# Patient Record
Sex: Male | Born: 1950
Health system: Southern US, Community
[De-identification: ages and names within clinical notes are randomized; demographics above are authoritative.]

## PROBLEM LIST (undated history)

## (undated) DIAGNOSIS — I2510 Atherosclerotic heart disease of native coronary artery without angina pectoris: Secondary | ICD-10-CM

## (undated) DIAGNOSIS — I209 Angina pectoris, unspecified: Secondary | ICD-10-CM

## (undated) DIAGNOSIS — I5022 Chronic systolic (congestive) heart failure: Secondary | ICD-10-CM

## (undated) DIAGNOSIS — Z8719 Personal history of other diseases of the digestive system: Secondary | ICD-10-CM

## (undated) DIAGNOSIS — I10 Essential (primary) hypertension: Secondary | ICD-10-CM

## (undated) DIAGNOSIS — G4730 Sleep apnea, unspecified: Secondary | ICD-10-CM

## (undated) DIAGNOSIS — I219 Acute myocardial infarction, unspecified: Secondary | ICD-10-CM

## (undated) DIAGNOSIS — N183 Chronic kidney disease, stage 3 unspecified: Secondary | ICD-10-CM

## (undated) DIAGNOSIS — E785 Hyperlipidemia, unspecified: Secondary | ICD-10-CM

## (undated) DIAGNOSIS — R0609 Other forms of dyspnea: Secondary | ICD-10-CM

## (undated) DIAGNOSIS — W3400XA Accidental discharge from unspecified firearms or gun, initial encounter: Secondary | ICD-10-CM

## (undated) DIAGNOSIS — F32A Depression, unspecified: Secondary | ICD-10-CM

## (undated) DIAGNOSIS — G629 Polyneuropathy, unspecified: Secondary | ICD-10-CM

## (undated) DIAGNOSIS — E119 Type 2 diabetes mellitus without complications: Secondary | ICD-10-CM

## (undated) DIAGNOSIS — I429 Cardiomyopathy, unspecified: Secondary | ICD-10-CM

## (undated) DIAGNOSIS — F329 Major depressive disorder, single episode, unspecified: Secondary | ICD-10-CM

## (undated) DIAGNOSIS — F0280 Dementia in other diseases classified elsewhere without behavioral disturbance: Secondary | ICD-10-CM

## (undated) DIAGNOSIS — H9190 Unspecified hearing loss, unspecified ear: Secondary | ICD-10-CM

## (undated) DIAGNOSIS — R0600 Dyspnea, unspecified: Secondary | ICD-10-CM

## (undated) DIAGNOSIS — R062 Wheezing: Secondary | ICD-10-CM

## (undated) DIAGNOSIS — F0150 Vascular dementia without behavioral disturbance: Secondary | ICD-10-CM

## (undated) HISTORY — PX: HERNIA REPAIR: SHX51

## (undated) HISTORY — PX: OTHER SURGICAL HISTORY: SHX169

## (undated) HISTORY — PX: CARDIAC CATHETERIZATION: SHX172

## (undated) HISTORY — PX: CORONARY ANGIOPLASTY: SHX604

## (undated) HISTORY — PX: EYE SURGERY: SHX253

## (undated) HISTORY — PX: CHOLECYSTECTOMY: SHX55

---

## 1980-02-20 DIAGNOSIS — W3400XA Accidental discharge from unspecified firearms or gun, initial encounter: Secondary | ICD-10-CM

## 1980-02-20 HISTORY — DX: Accidental discharge from unspecified firearms or gun, initial encounter: W34.00XA

## 1998-12-19 ENCOUNTER — Inpatient Hospital Stay (HOSPITAL_COMMUNITY): Admission: EM | Admit: 1998-12-19 | Discharge: 1998-12-21 | Payer: Self-pay | Admitting: Cardiology

## 1998-12-19 ENCOUNTER — Encounter: Payer: Self-pay | Admitting: Cardiology

## 2004-08-30 ENCOUNTER — Ambulatory Visit: Payer: Self-pay | Admitting: Vascular Surgery

## 2004-10-23 ENCOUNTER — Ambulatory Visit: Payer: Self-pay | Admitting: Nurse Practitioner

## 2005-01-09 ENCOUNTER — Ambulatory Visit: Payer: Self-pay | Admitting: Cardiology

## 2005-05-31 ENCOUNTER — Other Ambulatory Visit: Payer: Self-pay

## 2005-05-31 ENCOUNTER — Observation Stay: Payer: Self-pay | Admitting: Internal Medicine

## 2006-01-17 ENCOUNTER — Other Ambulatory Visit: Payer: Self-pay

## 2006-01-17 ENCOUNTER — Inpatient Hospital Stay: Payer: Self-pay | Admitting: *Deleted

## 2006-02-22 ENCOUNTER — Other Ambulatory Visit: Payer: Self-pay

## 2006-02-22 ENCOUNTER — Emergency Department: Payer: Self-pay | Admitting: Unknown Physician Specialty

## 2008-02-05 ENCOUNTER — Ambulatory Visit: Payer: Self-pay | Admitting: Family Medicine

## 2009-10-13 ENCOUNTER — Inpatient Hospital Stay: Payer: Self-pay | Admitting: Internal Medicine

## 2010-07-03 ENCOUNTER — Ambulatory Visit: Payer: Self-pay | Admitting: Internal Medicine

## 2010-07-21 ENCOUNTER — Ambulatory Visit: Payer: Self-pay | Admitting: Internal Medicine

## 2010-08-20 ENCOUNTER — Ambulatory Visit: Payer: Self-pay | Admitting: Internal Medicine

## 2010-09-20 ENCOUNTER — Ambulatory Visit: Payer: Self-pay | Admitting: Internal Medicine

## 2010-10-21 ENCOUNTER — Ambulatory Visit: Payer: Self-pay | Admitting: Internal Medicine

## 2010-11-20 ENCOUNTER — Ambulatory Visit: Payer: Self-pay

## 2010-11-20 ENCOUNTER — Ambulatory Visit: Payer: Self-pay | Admitting: Internal Medicine

## 2011-08-09 ENCOUNTER — Inpatient Hospital Stay: Payer: Self-pay | Admitting: Internal Medicine

## 2011-08-09 LAB — BASIC METABOLIC PANEL
Anion Gap: 4 — ABNORMAL LOW (ref 7–16)
BUN: 14 mg/dL (ref 7–18)
Calcium, Total: 8.9 mg/dL (ref 8.5–10.1)
Chloride: 107 mmol/L (ref 98–107)
Co2: 26 mmol/L (ref 21–32)
Creatinine: 1.52 mg/dL — ABNORMAL HIGH (ref 0.60–1.30)
EGFR (African American): 56 — ABNORMAL LOW
EGFR (Non-African Amer.): 48 — ABNORMAL LOW
Glucose: 173 mg/dL — ABNORMAL HIGH (ref 65–99)
Osmolality: 278 (ref 275–301)
Potassium: 4.6 mmol/L (ref 3.5–5.1)
Sodium: 137 mmol/L (ref 136–145)

## 2011-08-09 LAB — TROPONIN I
Troponin-I: <0.02 ng/mL
Troponin-I: <0.02 ng/mL

## 2011-08-09 LAB — CK TOTAL AND CKMB (NOT AT ARMC)
CK, Total: 214 U/L (ref 35–232)
CK, Total: 169 U/L (ref 35–232)
CK-MB: 4.3 ng/mL — ABNORMAL HIGH (ref 0.5–3.6)
CK-MB: 3.3 ng/mL (ref 0.5–3.6)

## 2011-08-09 LAB — CBC
HCT: 34.5 % — ABNORMAL LOW (ref 40.0–52.0)
HGB: 11.6 g/dL — ABNORMAL LOW (ref 13.0–18.0)
MCH: 28.7 pg (ref 26.0–34.0)
MCHC: 33.7 g/dL (ref 32.0–36.0)
MCV: 85 fL (ref 80–100)
Platelet: 99 10*3/uL — ABNORMAL LOW (ref 150–440)
RBC: 4.05 10*6/uL — ABNORMAL LOW (ref 4.40–5.90)
RDW: 15.4 % — ABNORMAL HIGH (ref 11.5–14.5)
WBC: 4.4 10*3/uL (ref 3.8–10.6)

## 2011-08-10 LAB — BASIC METABOLIC PANEL
Anion Gap: 4 — ABNORMAL LOW (ref 7–16)
BUN: 16 mg/dL (ref 7–18)
Co2: 28 mmol/L (ref 21–32)
EGFR (African American): 56 — ABNORMAL LOW
EGFR (Non-African Amer.): 48 — ABNORMAL LOW
Glucose: 97 mg/dL (ref 65–99)
Potassium: 4.4 mmol/L (ref 3.5–5.1)

## 2011-08-10 LAB — CBC WITH DIFFERENTIAL/PLATELET
Basophil #: 0 10*3/uL (ref 0.0–0.1)
HCT: 33 % — ABNORMAL LOW (ref 40.0–52.0)
HGB: 11.4 g/dL — ABNORMAL LOW (ref 13.0–18.0)
Lymphocyte #: 1.2 10*3/uL (ref 1.0–3.6)
Lymphocyte %: 24.9 %
MCH: 29.3 pg (ref 26.0–34.0)
MCHC: 34.5 g/dL (ref 32.0–36.0)
Monocyte #: 0.8 x10 3/mm (ref 0.2–1.0)
Monocyte %: 16.7 %
Neutrophil %: 54.5 %
RDW: 14.9 % — ABNORMAL HIGH (ref 11.5–14.5)

## 2011-08-10 LAB — LIPID PANEL
Cholesterol: 114 mg/dL (ref 0–200)
HDL Cholesterol: 22 mg/dL — ABNORMAL LOW (ref 40–60)
Ldl Cholesterol, Calc: 32 mg/dL (ref 0–100)
Triglycerides: 300 mg/dL — ABNORMAL HIGH (ref 0–200)
VLDL Cholesterol, Calc: 60 mg/dL — ABNORMAL HIGH (ref 5–40)

## 2011-08-10 LAB — TROPONIN I: Troponin-I: <0.02 ng/mL

## 2011-08-10 LAB — HEMOGLOBIN A1C: Hemoglobin A1C: 7.9 % — ABNORMAL HIGH (ref 4.2–6.3)

## 2011-08-10 LAB — CK TOTAL AND CKMB (NOT AT ARMC): CK, Total: 158 U/L (ref 35–232)

## 2011-09-11 LAB — TROPONIN I: Troponin-I: <0.02 ng/mL

## 2011-09-11 LAB — BASIC METABOLIC PANEL
Anion Gap: 8 (ref 7–16)
BUN: 35 mg/dL — ABNORMAL HIGH (ref 7–18)
Calcium, Total: 8.7 mg/dL (ref 8.5–10.1)
Chloride: 106 mmol/L (ref 98–107)
Co2: 24 mmol/L (ref 21–32)
Creatinine: 1.83 mg/dL — ABNORMAL HIGH (ref 0.60–1.30)
EGFR (African American): 45 — ABNORMAL LOW
EGFR (Non-African Amer.): 39 — ABNORMAL LOW
Glucose: 125 mg/dL — ABNORMAL HIGH (ref 65–99)
Osmolality: 285 (ref 275–301)
Potassium: 4.4 mmol/L (ref 3.5–5.1)
Sodium: 138 mmol/L (ref 136–145)

## 2011-09-11 LAB — CBC
HCT: 38.3 % — ABNORMAL LOW (ref 40.0–52.0)
HGB: 12.9 g/dL — ABNORMAL LOW (ref 13.0–18.0)
MCH: 29.3 pg (ref 26.0–34.0)
MCHC: 33.6 g/dL (ref 32.0–36.0)
MCV: 87 fL (ref 80–100)
Platelet: 114 10*3/uL — ABNORMAL LOW (ref 150–440)
RBC: 4.4 10*6/uL (ref 4.40–5.90)
RDW: 15.5 % — ABNORMAL HIGH (ref 11.5–14.5)
WBC: 8.2 10*3/uL (ref 3.8–10.6)

## 2011-09-12 ENCOUNTER — Observation Stay: Payer: Self-pay | Admitting: Internal Medicine

## 2011-09-12 LAB — URINALYSIS, COMPLETE
Bacteria: NONE SEEN
Bilirubin,UR: NEGATIVE
Blood: NEGATIVE
Glucose,UR: 150 mg/dL (ref 0–75)
Hyaline Cast: 3
Ketone: NEGATIVE
Leukocyte Esterase: NEGATIVE
Nitrite: NEGATIVE
Ph: 5 (ref 4.5–8.0)
Protein: NEGATIVE
RBC,UR: NONE SEEN /HPF (ref 0–5)
Specific Gravity: 1.023 (ref 1.003–1.030)
Squamous Epithelial: NONE SEEN
WBC UR: 1 /HPF (ref 0–5)

## 2011-09-12 LAB — BASIC METABOLIC PANEL
Anion Gap: 7 (ref 7–16)
BUN: 30 mg/dL — ABNORMAL HIGH (ref 7–18)
Calcium, Total: 8.6 mg/dL (ref 8.5–10.1)
Chloride: 105 mmol/L (ref 98–107)
Co2: 27 mmol/L (ref 21–32)
Creatinine: 1.69 mg/dL — ABNORMAL HIGH (ref 0.60–1.30)
EGFR (African American): 49 — ABNORMAL LOW
EGFR (Non-African Amer.): 43 — ABNORMAL LOW
Glucose: 275 mg/dL — ABNORMAL HIGH (ref 65–99)
Osmolality: 294 (ref 275–301)
Potassium: 4.5 mmol/L (ref 3.5–5.1)
Sodium: 139 mmol/L (ref 136–145)

## 2012-09-30 ENCOUNTER — Emergency Department: Payer: Self-pay | Admitting: Emergency Medicine

## 2014-03-11 ENCOUNTER — Ambulatory Visit (HOSPITAL_COMMUNITY): Payer: Self-pay | Admitting: Psychiatry

## 2014-03-15 ENCOUNTER — Ambulatory Visit (HOSPITAL_COMMUNITY): Payer: Self-pay | Admitting: Psychiatry

## 2014-04-23 DIAGNOSIS — Z9861 Coronary angioplasty status: Secondary | ICD-10-CM | POA: Insufficient documentation

## 2014-04-23 DIAGNOSIS — Z9889 Other specified postprocedural states: Secondary | ICD-10-CM | POA: Insufficient documentation

## 2014-04-23 DIAGNOSIS — I10 Essential (primary) hypertension: Secondary | ICD-10-CM | POA: Insufficient documentation

## 2014-04-23 DIAGNOSIS — G4733 Obstructive sleep apnea (adult) (pediatric): Secondary | ICD-10-CM | POA: Insufficient documentation

## 2014-04-23 DIAGNOSIS — Z8739 Personal history of other diseases of the musculoskeletal system and connective tissue: Secondary | ICD-10-CM | POA: Insufficient documentation

## 2014-04-23 DIAGNOSIS — E785 Hyperlipidemia, unspecified: Secondary | ICD-10-CM | POA: Insufficient documentation

## 2014-04-23 DIAGNOSIS — I429 Cardiomyopathy, unspecified: Secondary | ICD-10-CM | POA: Insufficient documentation

## 2014-04-23 DIAGNOSIS — I219 Acute myocardial infarction, unspecified: Secondary | ICD-10-CM | POA: Insufficient documentation

## 2014-04-26 DIAGNOSIS — R0602 Shortness of breath: Secondary | ICD-10-CM | POA: Insufficient documentation

## 2014-06-08 NOTE — Discharge Summary (Signed)
PATIENT NAME:  Todd Spencer, Todd Spencer MR#:  [id number] DATE OF BIRTH:  07/26/1950  DATE OF ADMISSION:  09/12/2011 DATE OF DISCHARGE:  09/12/2011   DISCHARGE DIAGNOSES:  1. Hypoglycemia.  2. Acute encephalopathy.  3. Dehydration.  4. Acute renal failure/CKD.  5. Hypertension.  6. Coronary artery disease.   CONSULTS: None.   IMAGING STUDIES:  1. CT scan of the head showed cortical atrophy and chronic small vessel ischemia. No evidence of acute abnormalities.  2. Chest x-ray showed no acute cardiopulmonary abnormalities.   ADMITTING HISTORY AND PHYSICAL: Please see detailed history and physical dictated previously by Dr. Lenore Manner. In brief, this is a 64 year old Caucasian male patient with uncontrolled diabetes mellitus on insulin and oral hypoglycemic medications along with coronary artery disease and hypertension who presented to the Emergency Room complaining of dizziness, confusion, and blurring of vision. On checking his blood sugars, he was at 47. The patient presented to the Emergency Room after calling EMS and was admitted for further treatment with his persistent hypoglycemia.   HOSPITAL COURSE: The patient was admitted on the telemonitored floor. His insulin and oral hypoglycemics were held with which his blood sugars improved. He did not have any further hypoglycemic episodes and at the time of discharge blood sugars were 189. The patient does not have any confusion, is back to his baseline, and is being discharged home in a stable condition with blood pressure 142/81, respirations 18, saturating 97% on room air, and afebrile.   At the time of discharge the patient's insulin has been decreased from 80 units of Lantus once a day to 65 units and his glipizide decreased from 10 mg to 5 mg twice a day.   DISCHARGE MEDICATIONS:  1. Multivitamin 1 tablet oral once a day.  2. Lyrica 300 mg oral 2 times a day.  3. Isosorbide 30 mg oral 3 times a day.  4. Gemfibrozil 600 mg oral 2 times a day.   5. Ranitidine 1 tablet oral 2 times a day 150 mg.  6. Doxepin 100 mg oral once a day.  7. Fish Oil 1 capsule oral once a day.  8. Plavix 75 mg oral once a day.  9. Meclizine 25 mg oral 2 times a day.  10. Aspirin 81 mg oral once a day.  11. Quinapril 10 mg oral once a day.  12. Mucinex D 1 tablet oral 1 to 2 times a day as needed.  13. Simvastatin 40 mg oral once a day.  14. Metoprolol succinate 12.5 mg oral 2 times a day.  15. Lantus 65 units subcutaneous once a day.  16. Glipizide 10 mg oral half a tablet 2 times a day.   DISCHARGE INSTRUCTIONS:  1. Follow-up with primary care physician within a week.  2. The patient has been advised to check his blood sugars before meals and at bedtime, a total of four times a day. He is to keep a log and take it to primary care physician's office. 3. He will be on a diabetic diet.  4. The patient is to call his doctor or return to the Emergency Room if he notices any blood sugars less than 60.   TIME SPENT ON THIS DICTATION AND COORDINATING CARE: 35 minutes.   ____________________________ Leia Alf Jaxyn Rout, MD srs:drc D: 09/12/2011 12:45:27 ET T: 09/12/2011 14:04:32 ET JOB#: [id number]  cc: Alveta Heimlich R. Darvin Neighbours, MD, <Dictator> Marguerita Merles, MD Neita Carp MD ELECTRONICALLY SIGNED 09/19/2011 13:49
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
## 2014-06-13 NOTE — Consult Note (Signed)
Brief Consult Note: Diagnosis: pt with history of cad s/p pci and poba of rca admitted with chest pain with both typical and atypical features.   Patient was seen by consultant.   Consult note dictated.   Recommend to proceed with surgery or procedure.   Recommend further assessment or treatment.   Orders entered.   Comments: pt with chest pain similar to his angina now has ruled out for mi.  stable with no acute ekg changes  will need cardiac cath to evaluate anatomy in pt with chest pain, cad at rest..  risk and benefits discussed.  Electronic Signatures: Teodoro Spray (MD)  (Signed 21-Jun-13 05:51)  Authored: Brief Consult Note   Last Updated: 21-Jun-13 05:51 by Teodoro Spray (MD)

## 2014-06-13 NOTE — Discharge Summary (Signed)
PATIENT NAME:  Todd Spencer, MCMINN MR#:  [id number] DATE OF BIRTH:  [birth year]  DATE OF ADMISSION:  08/09/2011 DATE OF DISCHARGE:  08/10/2011  ADMITTING PHYSICIAN: Dr. Bettey Costa DISCHARGING PHYSICIAN: Dr. Gladstone Lighter  PRIMARY CARE PHYSICIAN: Dr. Delight Stare   CONSULTATION IN HOSPITAL: Cardiology consultation Dr. Bartholome Bill.   DISCHARGE DIAGNOSES:  1. Chest pain secondary to unstable angina and cardiac catheterization done this admission showing severe right coronary artery disease not amenable for any stenting or bypass surgery.  2. Coronary artery disease with diffuse right coronary artery disease on catheterization.  3. Hypertension.  4. Insulin-dependent diabetes mellitus.  5. Hyperlipemia.  6. Ischemic cardiomyopathy.  7. Peripheral neuropathy.   DISCHARGE HOME MEDICATIONS:  1. Lantus 80 units sub-Q daily.  2. Multivitamin daily.  3. Lyrica 300 mg p.o. b.i.d.  4. Imdur 30 mg p.o. t.i.d.  5. Glipizide 10 mg p.o. b.i.d.  6. Gemfibrozil 600 mg p.o. b.i.d.  7. Ranitidine 150 mg p.o. b.i.d.  8. Doxepin 100 mg in the morning and 200 mg at bedtime.  9. Fish oil 1 capsule p.o. daily.  10. Plavix 75 mg p.o. daily.  11. Meclizine 25 mg p.o. b.i.d.  12. Naproxen 500 mg p.o. b.i.d.  13. Aspirin 81 mg p.o. daily.  14. Quinapril 10 mg p.o. daily.  15. Mucinex p.r.n. once or twice a day for cough.  16. Simvastatin 40 mg p.o. at bedtime.   17. Metoprolol 12.5 mg p.o. b.i.d.  18. Nitroglycerin sublingual tablet 0.4 mg q.5 minutes p.r.n. for chest pain.  19. Patient also on metformin 1000 mg p.o. b.i.d. but that was asked to stop for two more days because of his cardiac catheterization with contrast done.   DISCHARGE DIET: Low sodium, ADA diet.   DISCHARGE HOME OXYGEN: None.   DISCHARGE ACTIVITY: As tolerated.    FOLLOWUP INSTRUCTIONS:  1. PCP follow up in 1 to 2 weeks.  2. Cardiology follow up in 2 to 3 weeks. 3. Hold metoprolol if heart rate is less than 50.   LABORATORY,  DIAGNOSTIC AND RADIOLOGICAL DATA:  WBC 4.8, hemoglobin 11.4, hematocrit 33.0, platelet count 98.   Sodium 139, potassium 4.4, chloride 107, bicarbonate 28, BUN 16, creatinine 1.52, glucose 97, calcium 8.6. Hemoglobin A1c 7.9, LDL 32, HDL 22, total cholesterol 114, triglycerides 300. Cardiac enzymes remained negative while in the hospital. Chest x-ray showing no acute disease of the chest present.  Cardiac catheterization showing severe one vessel coronary artery disease of RCA diffuse 99% in stent restenosis and distal RCA and also occluded PDA with minimal collaterals present. Moderate apical hypokinesis and posterior basal akinesis is present.   BRIEF HOSPITAL COURSE: Mr. Todd Spencer is a 64 year old Caucasian male with past medical history significant for coronary artery disease status post stents in RCA, hypertension, diabetes presents to the hospital complaining of severe left-sided chest pain radiating down the arm. Because of his typical nature of the pain and risk for heart disease he was admitted to telemetry and seen by cardiology.  1. Unstable angina with prior coronary artery disease. He had a cardiac catheterization done on 08/10/2011 which revealed 99% in stent restenosis of RCA and also 100% PDA occlusion with minimal collaterals. Medical management was recommended because that was not amenable for any stenting or bypass graft surgery. She is already on Imdur and also Quinapril and aspirin and Plavix. Metoprolol dose was decreased because of his sinus bradycardia while in the hospital, heart rate in 50s mostly. He can follow up with cardiology in  2 to 3 weeks.  2. Diabetes mellitus. He is on Lantus, glipizide and also metformin. All his medications were continued except metformin because of the contrast he received during cardiac catheterization procedure so advised to hold off  for another 48 hours prior to restarting it. All his other home medications were continued. His course has been  otherwise uneventful in the hospital.   DISCHARGE CONDITION: Stable.   DISCHARGE DISPOSITION: Home.   TIME SPENT ON DISCHARGE: 45 minutes.  ____________________________ Gladstone Lighter, MD rk:cms D: 08/11/2011 11:26:24 ET T: 08/11/2011 12:44:38 ET JOB#: [id number]  cc: Gladstone Lighter, MD, <Dictator> Marguerita Merles, MD Javier Docker. Ubaldo Glassing, MD Gladstone Lighter MD ELECTRONICALLY SIGNED 08/15/2011 15:25
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
## 2014-06-13 NOTE — Consult Note (Signed)
    General Aspect patient is a 64 year old male with history of coronary artery disease status post PCI of the right coronary artery with relook cardiac catheterization in 2011 revealing distal RCA disease.  Intracoronary stent was unable to be passed through the proximal lesion and this was treated with plain old balloon angioplasty.  He now returns with chest pain that he states is similar to his angina.  EKG is unremarkable and he has ruled out for myocardial infarction.  He continues to have chest pain at rest.  He has been compliant with his medications.   Physical Exam:   GEN well nourished    HEENT PERRL    RESP normal resp effort  clear BS    CARD Regular rate and rhythm  Murmur    Murmur Systolic    Systolic Murmur axilla    ABD denies tenderness  normal BS    LYMPH negative neck    EXTR negative cyanosis/clubbing, negative edema    SKIN normal to palpation    NEURO cranial nerves intact, motor/sensory function intact    PSYCH A+O to time, place, person   Review of Systems:   Subjective/Chief Complaint chest pain at rest    General: No Complaints    Skin: No Complaints    ENT: No Complaints    Eyes: No Complaints    Neck: No Complaints    Respiratory: No Complaints    Cardiovascular: Chest pain or discomfort    Gastrointestinal: No Complaints    Genitourinary: No Complaints    Vascular: No Complaints    Musculoskeletal: No Complaints    Neurologic: No Complaints    Hematologic: No Complaints    Endocrine: No Complaints    Psychiatric: No Complaints    Review of Systems: All other systems were reviewed and found to be negative    Medications/Allergies Reviewed Medications/Allergies reviewed     Diabetes Mellitus,Type I (IDD):    Hypercholesterolemia:    Fibromyalgia:    neuropathy:    carpal tunnel syndrome:    Diabetes Mellitus, Type II (NIDD):    htn:    mi:    cardiac stents:    CABG (Coronary Artery Bypass Graft):    EKG:   EKG NSR    No Known Allergies:     Impression 64 year old male with history diabetes hypertension and coronary artery disease.  He is status post C. intervention of the RCA.  No presents with recurrent chest pain.  He is ruled out for myocardial infarction states the chest pain is similar to his angina.  He is unchanged from his baseline.  Given his persistent rest chest pain somewhat to his angina, we'll need to proceed left cardiac catheterization evaluate coronary anatomy    Plan 1.  Continue current medications 2.  Low-fat, low-cholesterol, low-sodium diet 3.  Proceed left cardiac catheterization evaluate coronary anatomy.  Further recommendations after the study is complete.  Risk and benefits of procedure were discussed with the patient he agrees to proceed.   Electronic Signatures: Teodoro Spray (MD)  (Signed 21-Jun-13 14:01)  Authored: General Aspect/Present Illness, History and Physical Exam, Review of System, Past Medical History, EKG , Allergies, Impression/Plan   Last Updated: 21-Jun-13 14:01 by Teodoro Spray (MD)

## 2014-07-01 ENCOUNTER — Encounter
Admission: RE | Admit: 2014-07-01 | Discharge: 2014-07-01 | Disposition: A | Discharge status: Home / Self Care | Payer: Medicare HMO | Source: Ambulatory Visit | Attending: Ophthalmology | Admitting: Ophthalmology

## 2014-07-01 DIAGNOSIS — R001 Bradycardia, unspecified: Secondary | ICD-10-CM | POA: Insufficient documentation

## 2014-07-01 DIAGNOSIS — Z01810 Encounter for preprocedural cardiovascular examination: Secondary | ICD-10-CM | POA: Diagnosis not present

## 2014-07-01 DIAGNOSIS — I495 Sick sinus syndrome: Secondary | ICD-10-CM | POA: Diagnosis not present

## 2014-07-11 NOTE — H&P (Signed)
  History and physical was faxed and scanned in.   

## 2014-07-12 ENCOUNTER — Ambulatory Visit: Payer: Medicare HMO | Admitting: *Deleted

## 2014-07-12 ENCOUNTER — Encounter
Admission: RE | Disposition: A | Discharge status: Home / Self Care | Payer: Self-pay | Source: Ambulatory Visit | Attending: Ophthalmology

## 2014-07-12 ENCOUNTER — Encounter: Payer: Self-pay | Admitting: *Deleted

## 2014-07-12 ENCOUNTER — Ambulatory Visit
Admission: RE | Admit: 2014-07-12 | Discharge: 2014-07-12 | Disposition: A | Discharge status: Home / Self Care | Payer: Medicare HMO | Source: Ambulatory Visit | Attending: Ophthalmology | Admitting: Ophthalmology

## 2014-07-12 DIAGNOSIS — G629 Polyneuropathy, unspecified: Secondary | ICD-10-CM | POA: Diagnosis not present

## 2014-07-12 DIAGNOSIS — H2511 Age-related nuclear cataract, right eye: Secondary | ICD-10-CM | POA: Insufficient documentation

## 2014-07-12 DIAGNOSIS — E780 Pure hypercholesterolemia: Secondary | ICD-10-CM | POA: Diagnosis not present

## 2014-07-12 DIAGNOSIS — I209 Angina pectoris, unspecified: Secondary | ICD-10-CM | POA: Diagnosis not present

## 2014-07-12 DIAGNOSIS — G4730 Sleep apnea, unspecified: Secondary | ICD-10-CM | POA: Diagnosis not present

## 2014-07-12 DIAGNOSIS — F329 Major depressive disorder, single episode, unspecified: Secondary | ICD-10-CM | POA: Insufficient documentation

## 2014-07-12 DIAGNOSIS — Z79899 Other long term (current) drug therapy: Secondary | ICD-10-CM | POA: Insufficient documentation

## 2014-07-12 DIAGNOSIS — R062 Wheezing: Secondary | ICD-10-CM | POA: Diagnosis not present

## 2014-07-12 DIAGNOSIS — E119 Type 2 diabetes mellitus without complications: Secondary | ICD-10-CM | POA: Diagnosis not present

## 2014-07-12 DIAGNOSIS — Z955 Presence of coronary angioplasty implant and graft: Secondary | ICD-10-CM | POA: Insufficient documentation

## 2014-07-12 DIAGNOSIS — K449 Diaphragmatic hernia without obstruction or gangrene: Secondary | ICD-10-CM | POA: Diagnosis not present

## 2014-07-12 DIAGNOSIS — I2510 Atherosclerotic heart disease of native coronary artery without angina pectoris: Secondary | ICD-10-CM | POA: Insufficient documentation

## 2014-07-12 DIAGNOSIS — H9190 Unspecified hearing loss, unspecified ear: Secondary | ICD-10-CM | POA: Insufficient documentation

## 2014-07-12 DIAGNOSIS — I252 Old myocardial infarction: Secondary | ICD-10-CM | POA: Insufficient documentation

## 2014-07-12 HISTORY — PX: CATARACT EXTRACTION W/PHACO: SHX586

## 2014-07-12 LAB — GLUCOSE, CAPILLARY: Glucose-Capillary: 149 mg/dL — ABNORMAL HIGH (ref 65–99)

## 2014-07-12 SURGERY — PHACOEMULSIFICATION, CATARACT, WITH IOL INSERTION
Anesthesia: Monitor Anesthesia Care | Laterality: Right

## 2014-07-12 MED ORDER — EPINEPHRINE HCL 1 MG/ML IJ SOLN
INTRAOCULAR | Status: DC | PRN
  Administered 2014-07-12: 200 mL

## 2014-07-12 MED ORDER — CEFUROXIME OPHTHALMIC INJECTION 1 MG/0.1 ML
INJECTION | OPHTHALMIC | Status: AC
  Filled 2014-07-12: qty 0.1

## 2014-07-12 MED ORDER — ALFENTANIL 500 MCG/ML IJ INJ
INJECTION | INTRAMUSCULAR | Status: DC | PRN
  Administered 2014-07-12: 500 ug via INTRAVENOUS

## 2014-07-12 MED ORDER — MOXIFLOXACIN HCL 0.5 % OP SOLN - NO CHARGE
OPHTHALMIC | Status: DC | PRN
  Administered 2014-07-12: 1 [drp] via OPHTHALMIC

## 2014-07-12 MED ORDER — PHENYLEPHRINE HCL 10 % OP SOLN
OPHTHALMIC | Status: AC
  Administered 2014-07-12: 1 [drp] via OPHTHALMIC
  Filled 2014-07-12: qty 5

## 2014-07-12 MED ORDER — NA CHONDROIT SULF-NA HYALURON 40-17 MG/ML IO SOLN
INTRAOCULAR | Status: DC | PRN
  Administered 2014-07-12: 1 mL via INTRAOCULAR

## 2014-07-12 MED ORDER — BUPIVACAINE HCL (PF) 0.75 % IJ SOLN
INTRAMUSCULAR | Status: AC
  Filled 2014-07-12: qty 10

## 2014-07-12 MED ORDER — EPINEPHRINE HCL 1 MG/ML IJ SOLN
INTRAMUSCULAR | Status: AC
  Filled 2014-07-12: qty 1

## 2014-07-12 MED ORDER — CARBACHOL 0.01 % IO SOLN
INTRAOCULAR | Status: DC | PRN
  Administered 2014-07-12: 0.5 mL via INTRAOCULAR

## 2014-07-12 MED ORDER — PHENYLEPHRINE HCL 10 % OP SOLN
1.0000 [drp] | OPHTHALMIC | Status: AC
  Administered 2014-07-12 (×4): 1 [drp] via OPHTHALMIC

## 2014-07-12 MED ORDER — LIDOCAINE HCL (PF) 4 % IJ SOLN
INTRAMUSCULAR | Status: DC | PRN
  Administered 2014-07-12: 4 mL

## 2014-07-12 MED ORDER — SODIUM CHLORIDE 0.9 % IV SOLN
INTRAVENOUS | Status: DC
  Administered 2014-07-12: 08:00:00 via INTRAVENOUS

## 2014-07-12 MED ORDER — CYCLOPENTOLATE HCL 2 % OP SOLN
OPHTHALMIC | Status: AC
  Administered 2014-07-12: 1 [drp] via OPHTHALMIC
  Filled 2014-07-12: qty 2

## 2014-07-12 MED ORDER — HYALURONIDASE HUMAN 150 UNIT/ML IJ SOLN
INTRAMUSCULAR | Status: AC
Start: 2014-07-12 — End: 2014-07-12
  Filled 2014-07-12: qty 1

## 2014-07-12 MED ORDER — ONDANSETRON HCL 4 MG/2ML IJ SOLN
INTRAMUSCULAR | Status: DC | PRN
  Administered 2014-07-12: 4 mg via INTRAVENOUS

## 2014-07-12 MED ORDER — LIDOCAINE HCL (PF) 4 % IJ SOLN
INTRAMUSCULAR | Status: AC
  Filled 2014-07-12: qty 5

## 2014-07-12 MED ORDER — TETRACAINE HCL 0.5 % OP SOLN
OPHTHALMIC | Status: DC | PRN
  Administered 2014-07-12: 1 [drp] via OPHTHALMIC

## 2014-07-12 MED ORDER — MOXIFLOXACIN HCL 0.5 % OP SOLN
1.0000 [drp] | OPHTHALMIC | Status: AC
  Administered 2014-07-12 (×3): 1 [drp] via OPHTHALMIC

## 2014-07-12 MED ORDER — TETRACAINE HCL 0.5 % OP SOLN
OPHTHALMIC | Status: AC
  Filled 2014-07-12: qty 2

## 2014-07-12 MED ORDER — CYCLOPENTOLATE HCL 2 % OP SOLN
1.0000 [drp] | OPHTHALMIC | Status: AC
  Administered 2014-07-12 (×4): 1 [drp] via OPHTHALMIC

## 2014-07-12 MED ORDER — NA CHONDROIT SULF-NA HYALURON 40-17 MG/ML IO SOLN
INTRAOCULAR | Status: AC
  Filled 2014-07-12: qty 1

## 2014-07-12 MED ORDER — MIDAZOLAM HCL 2 MG/2ML IJ SOLN
INTRAMUSCULAR | Status: DC | PRN
  Administered 2014-07-12: 2 mg via INTRAVENOUS

## 2014-07-12 MED ORDER — MOXIFLOXACIN HCL 0.5 % OP SOLN
OPHTHALMIC | Status: AC
  Administered 2014-07-12: 1 [drp] via OPHTHALMIC
  Filled 2014-07-12: qty 3

## 2014-07-12 SURGICAL SUPPLY — 27 items
ACTIVE FMS 8065152180 ×1 IMPLANT
CORD BIP STRL DISP 12FT (MISCELLANEOUS) ×2 IMPLANT
DRAPE XRAY CASSETTE 23X24 (DRAPES) ×2 IMPLANT
ERASER HMR WETFIELD 18G (MISCELLANEOUS) ×2 IMPLANT
GLOVE BIO SURGEON STRL SZ8 (GLOVE) ×2 IMPLANT
GLOVE SURG LX 6.5 MICRO (GLOVE) ×1
GLOVE SURG LX 8.0 MICRO (GLOVE) ×1
GLOVE SURG LX STRL 6.5 MICRO (GLOVE) ×1 IMPLANT
GLOVE SURG LX STRL 8.0 MICRO (GLOVE) ×1 IMPLANT
GOWN STRL REUS W/ TWL LRG LVL3 (GOWN DISPOSABLE) ×1 IMPLANT
GOWN STRL REUS W/ TWL XL LVL3 (GOWN DISPOSABLE) ×1 IMPLANT
GOWN STRL REUS W/TWL LRG LVL3 (GOWN DISPOSABLE) ×2
GOWN STRL REUS W/TWL XL LVL3 (GOWN DISPOSABLE) ×2
LENS IOL ACRYSERT 20.5 (Intraocular Lens) ×1 IMPLANT
PACK CATARACT (MISCELLANEOUS) ×2 IMPLANT
PACK CATARACT DINGLEDEIN LX (MISCELLANEOUS) ×2 IMPLANT
PACK EYE AFTER SURG (MISCELLANEOUS) ×2 IMPLANT
SHLD EYE VISITEC  UNIV (MISCELLANEOUS) ×2 IMPLANT
SN6CWS20.5 ACRYSOF LENS ×1 IMPLANT
SOL PREP PVP 2OZ (MISCELLANEOUS) ×2
SOLUTION PREP PVP 2OZ (MISCELLANEOUS) ×1 IMPLANT
SUT SILK 5-0 (SUTURE) ×2 IMPLANT
SYR 5ML LL (SYRINGE) ×2 IMPLANT
SYR TB 1ML 27GX1/2 LL (SYRINGE) ×2 IMPLANT
WATER STERILE IRR 1000ML POUR (IV SOLUTION) ×2 IMPLANT
WIPE NON LINTING 3.25X3.25 (MISCELLANEOUS) ×2 IMPLANT
sn6cws20.5 acrysof lens ×1 IMPLANT

## 2014-07-12 NOTE — Interval H&P Note (Signed)
History and Physical Interval Note:  07/12/2014 9:40 AM  Todd Spencer  has presented today for surgery, with the diagnosis of cataract  The various methods of treatment have been discussed with the patient and family. After consideration of risks, benefits and other options for treatment, the patient has consented to  Procedure(s): CATARACT EXTRACTION PHACO AND INTRAOCULAR LENS PLACEMENT ([place]) (Right) as a surgical intervention .  The patient's history has been reviewed, patient examined, no change in status, stable for surgery.  I have reviewed the patient's chart and labs.  Questions were answered to the patient's satisfaction.     [name]

## 2014-07-12 NOTE — Op Note (Deleted)
Date of Surgery: 07/12/2014 Date of Dictation: 07/12/2014 10:21 AM Pre-operative Diagnosis: Nuclear Sclerotic and Posterior Subcapsular Cataract right Eye Post-operative Diagnosis: same Procedure performed: Extra-capsular Cataract Extraction (ECCE) with placement of a posterior chamber intraocular lens (IOL) right Eye IOL:  Implant Name Type Inv. Item Serial No. Manufacturer Lot No. LRB No. Used  [id number] acrysof lens     [id number]     Right 1   Anesthesia: 2% Lidocaine and 4% Marcaine in a 50/50 mixture with 10 unites/ml of Hylenex given as a peribulbar Anesthesiologist: Anesthesiologist: Elyse Hsu, MD CRNA: Leander Rams, CRNA Complications: none Estimated Blood Loss: less than 1 ml  Description of procedure:  The patient was given anesthesia and sedation via intravenous access. The patient was then prepped and draped in the usual fashion. A 25-gauge needle was bent for initiating the capsulorhexis. A 5-0 silk suture was placed through the conjunctiva superior and inferiorly to serve as bridle sutures. Hemostasis was obtained at the superior limbus using an eraser cautery. A partial thickness groove was made at the anterior surgical limbus with a 64 Beaver blade and this was dissected anteriorly with an Avaya. The anterior chamber was entered at 10 o'clock with a 1.0 mm paracentesis knife and through the lamellar dissection with a 2.6 mm Alcon keratome. DiscoVisc was injected to replace the aqueous and a continuous tear curvilinear capsulorhexis was performed using a bent 25-gauge needle.  Balance salt on a syringe was used to perform hydro-dissection and phacoemulsification was carried out using a divide and conquer technique. Procedure(s) with comments: CATARACT EXTRACTION PHACO AND INTRAOCULAR LENS PLACEMENT ([facility]) (Right) - [place] 01:09 AP% 22.8 CDE 26.03. Irrigation/aspiration was used to remove the residual cortex and the capsular bag was inflated with DiscoVisc. The  intraocular lens was inserted into the capsular bag using a pre-loaded Acrysert Delivery System. Irrigation/aspiration was used to remove the residual DiscoVisc. The wound was inflated with balanced salt and checked for leaks. None were found. Miostat was injected via the paracentesis track and 0.1 ml of cefuroxime containing 1 mg of drug  was injected via the paracentesis track. The wound was checked for leaks again and none were found.   The bridal sutures were removed and two drops of Vigamox were placed on the eye. An eye shield was placed to protect the eye and the patient was discharged to the recovery area in good condition.   Moriah Shawley MD

## 2014-07-12 NOTE — Discharge Instructions (Addendum)
See handoutAMBULATORY SURGERY  °DISCHARGE INSTRUCTIONS ° ° °1) The drugs that you were given will stay in your system until tomorrow so for the next 24 hours you should not: ° °A) Drive an automobile °B) Make any legal decisions °C) Drink any alcoholic beverage ° ° °2) You may resume regular meals tomorrow.  Today it is better to start with liquids and gradually work up to solid foods. ° °You may eat anything you prefer, but it is better to start with liquids, then soup and crackers, and gradually work up to solid foods. ° ° °3) Please notify your doctor immediately if you have any unusual bleeding, trouble breathing, redness and pain at the surgery site, drainage, fever, or pain not relieved by medication. °4)  ° °5) Your post-operative visit with Dr.    Porfilio                 °           °     is: Date:                        Time:   ° °Please call to schedule your post-operative visit. ° °6) Additional Instructions: ° °

## 2014-07-12 NOTE — Transfer of Care (Signed)
Immediate Anesthesia Transfer of Care Note  Patient: Todd Spencer  Procedure(s) Performed: Procedure(s) with comments: CATARACT EXTRACTION PHACO AND INTRAOCULAR LENS PLACEMENT ([facility]) (Right) - [place] 01:09 AP% 22.8 CDE 26.03  Patient Location: PACU  Anesthesia Type:MAC  Level of Consciousness: awake  Airway & Oxygen Therapy: Patient Spontanous Breathing  Post-op Assessment: Report given to RN  Post vital signs: stable  Last Vitals:  Filed Vitals:   07/12/14 1024  BP: 123/59  Pulse: 51  Temp: 35.8 C  Resp: 20    Complications: No apparent anesthesia complications

## 2014-07-12 NOTE — Anesthesia Preprocedure Evaluation (Addendum)
Anesthesia Evaluation   Patient awake    Reviewed: Allergy & Precautions, NPO status , Patient's Chart, lab work & pertinent test results  Airway Mallampati: IV  TM Distance: >3 FB Neck ROM: Limited  Mouth opening: Limited Mouth Opening  Dental  (+) Edentulous Upper, Edentulous Lower   Pulmonary former smoker,    Pulmonary exam normal       Cardiovascular hypertension, Pt. on home beta blockers + CAD, + Past MI, + Cardiac Stents and + Peripheral Vascular Disease  Bradycardic and needs a pacemaker--he's been told.   Neuro/Psych    GI/Hepatic   Endo/Other  diabetes, Type 2BG 149 this morning.  Renal/GU      Musculoskeletal   Abdominal (+) + obese,   Peds  Hematology   Anesthesia Other Findings   Reproductive/Obstetrics                             Anesthesia Physical Anesthesia Plan  ASA: IV  Anesthesia Plan: MAC   Post-op Pain Management:    Induction:   Airway Management Planned: Nasal Cannula  Additional Equipment:   Intra-op Plan:   Post-operative Plan:   Informed Consent: I have reviewed the patients History and Physical, chart, labs and discussed the procedure including the risks, benefits and alternatives for the proposed anesthesia with the patient or authorized representative who has indicated his/her understanding and acceptance.     Plan Discussed with: CRNA  Anesthesia Plan Comments:         Anesthesia Quick Evaluation

## 2014-07-12 NOTE — Anesthesia Postprocedure Evaluation (Signed)
  Anesthesia Post-op Note  Patient: Todd Spencer  Procedure(s) Performed: Procedure(s) with comments: CATARACT EXTRACTION PHACO AND INTRAOCULAR LENS PLACEMENT ([facility]) (Right) - [place] 01:09 AP% 22.8 CDE 26.03  Anesthesia type:MAC  Patient location: PACU  Post pain: Pain level controlled  Post assessment: Post-op Vital signs reviewed, Patient's Cardiovascular Status Stable, Respiratory Function Stable, Patent Airway and No signs of Nausea or vomiting  Post vital signs: Reviewed and stable  Last Vitals:  Filed Vitals:   07/12/14 1030  BP: 134/73  Pulse: 55  Temp:   Resp: 20    Level of consciousness: awake, alert  and patient cooperative  Complications: No apparent anesthesia complications

## 2014-07-12 NOTE — Progress Notes (Signed)
Pt did not take metoprolol this am - Dr Benjamine Mola aware of same and pt's preop HR preop report given to Nolene Ebbs, RN by Phillips Grout, RN

## 2014-07-14 ENCOUNTER — Encounter: Payer: Self-pay | Admitting: Ophthalmology

## 2014-07-20 ENCOUNTER — Other Ambulatory Visit: Payer: Self-pay

## 2014-07-20 ENCOUNTER — Encounter
Admission: RE | Admit: 2014-07-20 | Discharge: 2014-07-20 | Disposition: A | Discharge status: Home / Self Care | Payer: Medicare HMO | Source: Ambulatory Visit | Attending: Cardiology | Admitting: Cardiology

## 2014-07-20 ENCOUNTER — Ambulatory Visit
Admission: RE | Admit: 2014-07-20 | Discharge: 2014-07-20 | Disposition: A | Discharge status: Home / Self Care | Payer: Medicare HMO | Source: Ambulatory Visit | Attending: Cardiology | Admitting: Cardiology

## 2014-07-20 DIAGNOSIS — G4730 Sleep apnea, unspecified: Secondary | ICD-10-CM

## 2014-07-20 DIAGNOSIS — I2510 Atherosclerotic heart disease of native coronary artery without angina pectoris: Secondary | ICD-10-CM | POA: Insufficient documentation

## 2014-07-20 DIAGNOSIS — Z01818 Encounter for other preprocedural examination: Secondary | ICD-10-CM | POA: Insufficient documentation

## 2014-07-20 DIAGNOSIS — Z01812 Encounter for preprocedural laboratory examination: Secondary | ICD-10-CM | POA: Diagnosis present

## 2014-07-20 DIAGNOSIS — E785 Hyperlipidemia, unspecified: Secondary | ICD-10-CM | POA: Diagnosis not present

## 2014-07-20 DIAGNOSIS — I10 Essential (primary) hypertension: Secondary | ICD-10-CM | POA: Diagnosis not present

## 2014-07-20 DIAGNOSIS — J984 Other disorders of lung: Secondary | ICD-10-CM | POA: Diagnosis not present

## 2014-07-20 DIAGNOSIS — I252 Old myocardial infarction: Secondary | ICD-10-CM | POA: Insufficient documentation

## 2014-07-20 DIAGNOSIS — Z01810 Encounter for preprocedural cardiovascular examination: Secondary | ICD-10-CM | POA: Insufficient documentation

## 2014-07-20 HISTORY — DX: Hyperlipidemia, unspecified: E78.5

## 2014-07-20 HISTORY — DX: Sleep apnea, unspecified: G47.30

## 2014-07-20 HISTORY — DX: Cardiomyopathy, unspecified: I42.9

## 2014-07-20 HISTORY — DX: Essential (primary) hypertension: I10

## 2014-07-20 HISTORY — DX: Atherosclerotic heart disease of native coronary artery without angina pectoris: I25.10

## 2014-07-20 HISTORY — DX: Acute myocardial infarction, unspecified: I21.9

## 2014-07-20 LAB — BASIC METABOLIC PANEL
ANION GAP: 10 (ref 5–15)
BUN: 21 mg/dL — ABNORMAL HIGH (ref 6–20)
CO2: 26 mmol/L (ref 22–32)
Calcium: 9.6 mg/dL (ref 8.9–10.3)
Chloride: 104 mmol/L (ref 101–111)
Creatinine, Ser: 1.5 mg/dL — ABNORMAL HIGH (ref 0.61–1.24)
GFR calc Af Amer: 55 mL/min — ABNORMAL LOW (ref >60)
GFR calc non Af Amer: 47 mL/min — ABNORMAL LOW (ref >60)
Glucose, Bld: 139 mg/dL — ABNORMAL HIGH (ref 65–99)
Potassium: 4.4 mmol/L (ref 3.5–5.1)
Sodium: 140 mmol/L (ref 135–145)

## 2014-07-20 LAB — DIFFERENTIAL
BASOS PCT: 1 %
Basophils Absolute: 0.1 10*3/uL (ref 0–0.1)
EOS ABS: 0.2 10*3/uL (ref 0–0.7)
EOS PCT: 4 %
Lymphocytes Relative: 24 %
Lymphs Abs: 1.3 10*3/uL (ref 1.0–3.6)
Monocytes Absolute: 0.7 10*3/uL (ref 0.2–1.0)
Monocytes Relative: 13 %
Neutro Abs: 3.2 10*3/uL (ref 1.4–6.5)
Neutrophils Relative %: 58 %

## 2014-07-20 LAB — CBC
HEMATOCRIT: 37.7 % — AB (ref 40.0–52.0)
Hemoglobin: 13 g/dL (ref 13.0–18.0)
MCH: 29.6 pg (ref 26.0–34.0)
MCHC: 34.5 g/dL (ref 32.0–36.0)
MCV: 85.7 fL (ref 80.0–100.0)
Platelets: 105 10*3/uL — ABNORMAL LOW (ref 150–440)
RBC: 4.4 MIL/uL (ref 4.40–5.90)
RDW: 15.1 % — AB (ref 11.5–14.5)
WBC: 5.5 10*3/uL (ref 3.8–10.6)

## 2014-07-20 LAB — PROTIME-INR
INR: 0.97
Prothrombin Time: 13.1 seconds (ref 11.4–15.0)

## 2014-07-20 LAB — APTT: aPTT: 30 seconds (ref 24–36)

## 2014-07-20 NOTE — Patient Instructions (Addendum)
  Your procedure is scheduled on: Thursday 07/22/14 Report to Day Surgery.  Medical mall Entrance To find out your arrival time please call [phone] between 1PM - 3PM on Wednesday 07/21/14.  Remember: Instructions that are not followed completely may result in serious medical risk, up to and including death, or upon the discretion of your surgeon and anesthesiologist your surgery may need to be rescheduled.    __x__ 1. Do not eat food or drink liquids after midnight. No gum chewing or hard candies.     __x__ 2. No Alcohol for 24 hours before or after surgery.   ____ 3. Bring all medications with you on the day of surgery if instructed.    ___x_ 4. Notify your doctor if there is any change in your medical condition     (cold, fever, infections).     Do not wear jewelry, make-up, hairpins, clips or nail polish.  Do not wear lotions, powders, or perfumes. You may wear deodorant.  Do not shave 48 hours prior to surgery. Men may shave face and neck.  Do not bring valuables to the hospital.    [facility] is not responsible for any belongings or valuables.               Contacts, dentures or bridgework may not be worn into surgery.  Leave your suitcase in the car. After surgery it may be brought to your room.  For patients admitted to the hospital, discharge time is determined by your                treatment team.   Patients discharged the day of surgery will not be allowed to drive home.   Please read over the following fact sheets that you were given:   Surgical Site Infection Prevention   __x__ Take these medicines the morning of surgery with A SIP OF WATER:    1. lyrica  2. meclizine  3. isosorbide  4. ranitadine  5. metoprolol  6.  ____ Fleet Enema (as directed)   __x__ Use CHG Soap as directed  ____ Use inhalers on the day of surgery  __x__ Stop metformin 2 days prior to surgery    __x__ Take 1/2 of usual insulin dose the night before surgery and none on the morning  of surgery.   __x__ Stop Coumadin/Plavix/aspirin on hold plavix starting 5/28  ____ Stop Anti-inflammatories on    ____ Stop supplements until after surgery.    __x__ Bring C-Pap to the hospital.

## 2014-07-21 NOTE — OR Nursing (Addendum)
Sent copies of May 12 and May 31 EKG to DR. Paraschos

## 2014-07-22 ENCOUNTER — Ambulatory Visit
Admission: RE | Admit: 2014-07-22 | Discharge: 2014-07-22 | Disposition: A | Discharge status: Home / Self Care | Payer: Medicare HMO | Source: Ambulatory Visit | Attending: Cardiology | Admitting: Cardiology

## 2014-07-22 ENCOUNTER — Ambulatory Visit: Payer: Medicare HMO | Admitting: Certified Registered"

## 2014-07-22 ENCOUNTER — Encounter
Admission: RE | Disposition: A | Discharge status: Home / Self Care | Payer: Self-pay | Source: Ambulatory Visit | Attending: Cardiology

## 2014-07-22 ENCOUNTER — Encounter: Payer: Self-pay | Admitting: *Deleted

## 2014-07-22 DIAGNOSIS — I495 Sick sinus syndrome: Secondary | ICD-10-CM | POA: Insufficient documentation

## 2014-07-22 DIAGNOSIS — Z538 Procedure and treatment not carried out for other reasons: Secondary | ICD-10-CM | POA: Insufficient documentation

## 2014-07-22 DIAGNOSIS — R001 Bradycardia, unspecified: Secondary | ICD-10-CM | POA: Diagnosis present

## 2014-07-22 SURGERY — INSERTION, CARDIAC PACEMAKER
Anesthesia: General

## 2014-07-22 MED ORDER — SODIUM CHLORIDE 0.9 % IV SOLN
INTRAVENOUS | Status: DC
  Administered 2014-07-22 (×2): via INTRAVENOUS

## 2014-07-22 MED ORDER — SODIUM CHLORIDE 0.9 % IR SOLN
Freq: Once | Status: DC
  Filled 2014-07-22: qty 2

## 2014-07-22 MED ORDER — GENTAMICIN SULFATE 40 MG/ML IJ SOLN
INTRAMUSCULAR | Status: AC
  Filled 2014-07-22: qty 2

## 2014-07-22 MED ORDER — SODIUM CHLORIDE 0.9 % IJ SOLN
INTRAMUSCULAR | Status: AC
  Filled 2014-07-22: qty 50

## 2014-07-22 MED ORDER — CEFAZOLIN SODIUM 1-5 GM-% IV SOLN: 1.0000 g | Freq: Once | INTRAVENOUS | Status: DC

## 2014-07-22 MED ORDER — CEFAZOLIN SODIUM 1-5 GM-% IV SOLN
INTRAVENOUS | Status: AC
  Filled 2014-07-22: qty 50

## 2014-07-22 SURGICAL SUPPLY — 29 items
BAG DECANTER STRL (MISCELLANEOUS) ×1 IMPLANT
BRUSH SCRUB 4% CHG (MISCELLANEOUS) ×1 IMPLANT
CABLE SURG 12 DISP A/V CHANNEL (MISCELLANEOUS) ×1 IMPLANT
CANISTER SUCT 1200ML W/VALVE (MISCELLANEOUS) ×1 IMPLANT
CHLORAPREP W/TINT 26ML (MISCELLANEOUS) ×1 IMPLANT
COVER LIGHT HANDLE STERIS (MISCELLANEOUS) ×2 IMPLANT
COVER MAYO STAND STRL (DRAPES) ×1 IMPLANT
DRAPE C-ARM XRAY 36X54 (DRAPES) ×1 IMPLANT
DRESSING TELFA 4X3 1S ST N-ADH (GAUZE/BANDAGES/DRESSINGS) ×1 IMPLANT
DRSG TEGADERM 4X4.75 (GAUZE/BANDAGES/DRESSINGS) ×1 IMPLANT
GLOVE BIO SURGEON STRL SZ7.5 (GLOVE) ×1 IMPLANT
GLOVE BIO SURGEON STRL SZ8 (GLOVE) ×1 IMPLANT
GOWN STRL REUS W/ TWL LRG LVL3 (GOWN DISPOSABLE) ×1 IMPLANT
GOWN STRL REUS W/ TWL XL LVL3 (GOWN DISPOSABLE) ×1 IMPLANT
GOWN STRL REUS W/TWL LRG LVL3 (GOWN DISPOSABLE)
GOWN STRL REUS W/TWL XL LVL3 (GOWN DISPOSABLE)
IMMOBILIZER SHDR MD LX WHT (SOFTGOODS) ×1 IMPLANT
IMMOBILIZER SHDR XL LX WHT (SOFTGOODS) ×1 IMPLANT
INTRO PACEMKR SHEATH II 7FR (MISCELLANEOUS)
INTRODUCER PACEMKR SHTH II 7FR (MISCELLANEOUS) ×1 IMPLANT
IV NS 500ML (IV SOLUTION)
IV NS 500ML BAXH (IV SOLUTION) ×1 IMPLANT
KIT RM TURNOVER STRD PROC AR (KITS) ×1 IMPLANT
LABEL OR SOLS (LABEL) ×1 IMPLANT
MARKER SKIN W/RULER 31145785 (MISCELLANEOUS) ×1 IMPLANT
PACK PACE INSERTION (MISCELLANEOUS) ×1 IMPLANT
PAD GROUND ADULT SPLIT (MISCELLANEOUS) ×1 IMPLANT
PAD STATPAD (MISCELLANEOUS) ×1 IMPLANT
SUT SILK 0 SH 30 (SUTURE) ×3 IMPLANT

## 2014-07-22 NOTE — Anesthesia Preprocedure Evaluation (Deleted)
Anesthesia Evaluation  Patient identified by MRN, date of birth, ID band Patient awake    Reviewed: Allergy & Precautions, NPO status , Patient's Chart, lab work & pertinent test results  History of Anesthesia Complications Negative for: history of anesthetic complications  Airway Mallampati: III  TM Distance: >3 FB Neck ROM: Full    Dental  (+) Upper Dentures, Edentulous Lower   Pulmonary sleep apnea and Continuous Positive Airway Pressure Ventilation , COPDformer smoker (quit x 95),          Cardiovascular hypertension, Pt. on medications + CAD, + Past MI (sp stent) and + Cardiac Stents     Neuro/Psych    GI/Hepatic   Endo/Other  diabetes, Poorly Controlled, Type 2  Renal/GU      Musculoskeletal   Abdominal   Peds  Hematology   Anesthesia Other Findings   Reproductive/Obstetrics                             Anesthesia Physical Anesthesia Plan  ASA: III  Anesthesia Plan: General   Post-op Pain Management:    Induction: Intravenous  Airway Management Planned: Nasal Cannula  Additional Equipment:   Intra-op Plan:   Post-operative Plan:   Informed Consent: I have reviewed the patients History and Physical, chart, labs and discussed the procedure including the risks, benefits and alternatives for the proposed anesthesia with the patient or authorized representative who has indicated his/her understanding and acceptance.     Plan Discussed with:   Anesthesia Plan Comments:         Anesthesia Quick Evaluation

## 2014-08-04 NOTE — Op Note (Signed)
Date of Surgery: 07/12/2014 Date of Dictation: 08/04/2014 5:09 PM Pre-operative Diagnosis: Nuclear Sclerotic and Posterior Subcapsular Cataract right Eye Post-operative Diagnosis: same Procedure performed: Extra-capsular Cataract Extraction (ECCE) with placement of a posterior chamber intraocular lens (IOL) right Eye IOL:   Implant Name Type Inv. Item Serial No. Manufacturer Lot No. LRB No. Used  [id number] acrysof lens   [id number]   Right 1  LENS IOL ACRYSERT 20.5 - [id number] Intraocular Lens LENS IOL ACRYSERT 20.5   ALCON   Right 1   Anesthesia: 2% Lidocaine and 4% Marcaine in a 50/50 mixture with 10 unites/ml of Hylenex given as a peribulbar Anesthesiologist: Anesthesiologist: Elyse Hsu, MD CRNA: Leander Rams, CRNA Complications: none Estimated Blood Loss: less than 1 ml  Description of procedure:  The patient was given anesthesia and sedation via intravenous access. The patient was then prepped and draped in the usual fashion. A 25-gauge needle was bent for initiating the capsulorhexis. A 5-0 silk suture was placed through the conjunctiva superior and inferiorly to serve as bridle sutures. Hemostasis was obtained at the superior limbus using an eraser cautery. A partial thickness groove was made at the anterior surgical limbus with a 64 Beaver blade and this was dissected anteriorly with an Avaya. The anterior chamber was entered at 10 o'clock with a 1.0 mm paracentesis knife and through the lamellar dissection with a 2.6 mm Alcon keratome. DiscoVisc was injected to replace the aqueous and a continuous tear curvilinear capsulorhexis was performed using a bent 25-gauge needle.  Balance salt on a syringe was used to perform hydro-dissection and phacoemulsification was carried out using a divide and conquer technique. Procedure(s) with comments: CATARACT EXTRACTION PHACO AND INTRAOCULAR LENS PLACEMENT ([facility]) (Right) - [place] 01:09 AP% 22.8 CDE 26.03. Irrigation/aspiration was used  to remove the residual cortex and the capsular bag was inflated with DiscoVisc. The intraocular lens was inserted into the capsular bag using a pre-loaded Acrysert Delivery System. Irrigation/aspiration was used to remove the residual DiscoVisc. The wound was inflated with balanced salt and checked for leaks. None were found. Miostat was injected via the paracentesis track and 0.1 ml of cefuroxime containing 1 mg of drug  was injected via the paracentesis track. The wound was checked for leaks again and none were found.   The bridal sutures were removed and two drops of Vigamox were placed on the eye. An eye shield was placed to protect the eye and the patient was discharged to the recovery area in good condition.   Reta Norgren MD

## 2014-10-05 ENCOUNTER — Ambulatory Visit (INDEPENDENT_AMBULATORY_CARE_PROVIDER_SITE_OTHER): Payer: Medicare HMO | Admitting: Internal Medicine

## 2014-10-05 ENCOUNTER — Encounter: Payer: Self-pay | Admitting: Internal Medicine

## 2014-10-05 VITALS — BP 108/64 | HR 52 | Ht 71.0 in | Wt 234.5 lb

## 2014-10-05 DIAGNOSIS — I25810 Atherosclerosis of coronary artery bypass graft(s) without angina pectoris: Secondary | ICD-10-CM | POA: Diagnosis not present

## 2014-10-05 NOTE — Patient Instructions (Addendum)
Medication Instructions: - Decrease aspirin to 81 mg daily  Labwork: - none  Procedures/Testing: - none  Follow-Up: - Dr. Josefa Half office should be in touch with you to schedule a cardiac catheterization  - Your physician recommends that you schedule a follow-up appointment in: 3 months with Dr. Caryl Comes  Any Additional Special Instructions Will Be Listed Below (If Applicable). - please call the office back at [phone] to clarify your metoprolol (tartrate/ succinate)  - follow up with your sleep apnea physician or Primary Care doctor regarding your C-PAP machine
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
## 2014-10-13 ENCOUNTER — Encounter: Payer: Self-pay | Admitting: *Deleted

## 2014-10-13 DIAGNOSIS — I252 Old myocardial infarction: Secondary | ICD-10-CM | POA: Diagnosis not present

## 2014-10-13 DIAGNOSIS — H2512 Age-related nuclear cataract, left eye: Secondary | ICD-10-CM | POA: Diagnosis present

## 2014-10-13 DIAGNOSIS — Z87891 Personal history of nicotine dependence: Secondary | ICD-10-CM | POA: Diagnosis not present

## 2014-10-13 DIAGNOSIS — E119 Type 2 diabetes mellitus without complications: Secondary | ICD-10-CM | POA: Diagnosis not present

## 2014-10-13 DIAGNOSIS — H9190 Unspecified hearing loss, unspecified ear: Secondary | ICD-10-CM | POA: Diagnosis not present

## 2014-10-13 DIAGNOSIS — Z9841 Cataract extraction status, right eye: Secondary | ICD-10-CM | POA: Diagnosis not present

## 2014-10-13 DIAGNOSIS — R062 Wheezing: Secondary | ICD-10-CM | POA: Diagnosis not present

## 2014-10-13 DIAGNOSIS — Z6832 Body mass index (BMI) 32.0-32.9, adult: Secondary | ICD-10-CM | POA: Diagnosis not present

## 2014-10-13 DIAGNOSIS — K449 Diaphragmatic hernia without obstruction or gangrene: Secondary | ICD-10-CM | POA: Diagnosis not present

## 2014-10-13 DIAGNOSIS — G4730 Sleep apnea, unspecified: Secondary | ICD-10-CM | POA: Diagnosis not present

## 2014-10-13 DIAGNOSIS — G629 Polyneuropathy, unspecified: Secondary | ICD-10-CM | POA: Diagnosis not present

## 2014-10-13 DIAGNOSIS — E780 Pure hypercholesterolemia: Secondary | ICD-10-CM | POA: Diagnosis not present

## 2014-10-13 DIAGNOSIS — I25119 Atherosclerotic heart disease of native coronary artery with unspecified angina pectoris: Secondary | ICD-10-CM | POA: Diagnosis not present

## 2014-10-13 DIAGNOSIS — Z955 Presence of coronary angioplasty implant and graft: Secondary | ICD-10-CM | POA: Diagnosis not present

## 2014-10-13 DIAGNOSIS — F329 Major depressive disorder, single episode, unspecified: Secondary | ICD-10-CM | POA: Diagnosis not present

## 2014-10-13 DIAGNOSIS — E669 Obesity, unspecified: Secondary | ICD-10-CM | POA: Diagnosis not present

## 2014-10-13 NOTE — OR Nursing (Signed)
CLEARED BY [facility]

## 2014-10-18 ENCOUNTER — Ambulatory Visit: Payer: Medicare HMO | Admitting: Certified Registered Nurse Anesthetist

## 2014-10-18 ENCOUNTER — Encounter
Admission: RE | Disposition: A | Discharge status: Home / Self Care | Payer: Self-pay | Source: Ambulatory Visit | Attending: Ophthalmology

## 2014-10-18 ENCOUNTER — Encounter: Payer: Self-pay | Admitting: *Deleted

## 2014-10-18 ENCOUNTER — Ambulatory Visit
Admission: RE | Admit: 2014-10-18 | Discharge: 2014-10-18 | Disposition: A | Discharge status: Home / Self Care | Payer: Medicare HMO | Source: Ambulatory Visit | Attending: Ophthalmology | Admitting: Ophthalmology

## 2014-10-18 DIAGNOSIS — E669 Obesity, unspecified: Secondary | ICD-10-CM | POA: Insufficient documentation

## 2014-10-18 DIAGNOSIS — Z6832 Body mass index (BMI) 32.0-32.9, adult: Secondary | ICD-10-CM | POA: Insufficient documentation

## 2014-10-18 DIAGNOSIS — Z9841 Cataract extraction status, right eye: Secondary | ICD-10-CM | POA: Insufficient documentation

## 2014-10-18 DIAGNOSIS — R062 Wheezing: Secondary | ICD-10-CM | POA: Insufficient documentation

## 2014-10-18 DIAGNOSIS — H9190 Unspecified hearing loss, unspecified ear: Secondary | ICD-10-CM | POA: Insufficient documentation

## 2014-10-18 DIAGNOSIS — I25119 Atherosclerotic heart disease of native coronary artery with unspecified angina pectoris: Secondary | ICD-10-CM | POA: Insufficient documentation

## 2014-10-18 DIAGNOSIS — E780 Pure hypercholesterolemia: Secondary | ICD-10-CM | POA: Insufficient documentation

## 2014-10-18 DIAGNOSIS — Z87891 Personal history of nicotine dependence: Secondary | ICD-10-CM | POA: Insufficient documentation

## 2014-10-18 DIAGNOSIS — Z955 Presence of coronary angioplasty implant and graft: Secondary | ICD-10-CM | POA: Insufficient documentation

## 2014-10-18 DIAGNOSIS — G629 Polyneuropathy, unspecified: Secondary | ICD-10-CM | POA: Insufficient documentation

## 2014-10-18 DIAGNOSIS — E119 Type 2 diabetes mellitus without complications: Secondary | ICD-10-CM | POA: Insufficient documentation

## 2014-10-18 DIAGNOSIS — H2512 Age-related nuclear cataract, left eye: Secondary | ICD-10-CM | POA: Insufficient documentation

## 2014-10-18 DIAGNOSIS — G4730 Sleep apnea, unspecified: Secondary | ICD-10-CM | POA: Insufficient documentation

## 2014-10-18 DIAGNOSIS — I252 Old myocardial infarction: Secondary | ICD-10-CM | POA: Insufficient documentation

## 2014-10-18 DIAGNOSIS — K449 Diaphragmatic hernia without obstruction or gangrene: Secondary | ICD-10-CM | POA: Insufficient documentation

## 2014-10-18 DIAGNOSIS — F329 Major depressive disorder, single episode, unspecified: Secondary | ICD-10-CM | POA: Insufficient documentation

## 2014-10-18 HISTORY — DX: Major depressive disorder, single episode, unspecified: F32.9

## 2014-10-18 HISTORY — PX: CATARACT EXTRACTION W/PHACO: SHX586

## 2014-10-18 HISTORY — DX: Depression, unspecified: F32.A

## 2014-10-18 HISTORY — DX: Type 2 diabetes mellitus without complications: E11.9

## 2014-10-18 HISTORY — DX: Unspecified hearing loss, unspecified ear: H91.90

## 2014-10-18 HISTORY — DX: Angina pectoris, unspecified: I20.9

## 2014-10-18 HISTORY — DX: Personal history of other diseases of the digestive system: Z87.19

## 2014-10-18 HISTORY — DX: Wheezing: R06.2

## 2014-10-18 HISTORY — DX: Polyneuropathy, unspecified: G62.9

## 2014-10-18 LAB — GLUCOSE, CAPILLARY: GLUCOSE-CAPILLARY: 265 mg/dL — AB (ref 65–99)

## 2014-10-18 SURGERY — PHACOEMULSIFICATION, CATARACT, WITH IOL INSERTION
Anesthesia: LOCAL | Site: Eye | Laterality: Left | Wound class: Clean

## 2014-10-18 MED ORDER — ONDANSETRON HCL 4 MG/2ML IJ SOLN: 4.0000 mg | Freq: Once | INTRAMUSCULAR | Status: DC | PRN

## 2014-10-18 MED ORDER — PHENYLEPHRINE HCL 10 % OP SOLN
1.0000 [drp] | OPHTHALMIC | Status: DC | PRN
  Administered 2014-10-18 (×3): 1 [drp] via OPHTHALMIC

## 2014-10-18 MED ORDER — CEFUROXIME OPHTHALMIC INJECTION 1 MG/0.1 ML
INJECTION | OPHTHALMIC | Status: AC
  Filled 2014-10-18: qty 0.1

## 2014-10-18 MED ORDER — EPINEPHRINE HCL 1 MG/ML IJ SOLN
INTRAMUSCULAR | Status: DC | PRN
  Administered 2014-10-18: 200 mL via OPHTHALMIC

## 2014-10-18 MED ORDER — MIDAZOLAM HCL 2 MG/2ML IJ SOLN
INTRAMUSCULAR | Status: DC | PRN
  Administered 2014-10-18: 0.5 mg via INTRAVENOUS

## 2014-10-18 MED ORDER — LIDOCAINE HCL (PF) 1 % IJ SOLN
INTRAOCULAR | Status: DC | PRN
  Administered 2014-10-18: 4 mL via OPHTHALMIC

## 2014-10-18 MED ORDER — HYALURONIDASE HUMAN 150 UNIT/ML IJ SOLN
INTRAMUSCULAR | Status: AC
  Filled 2014-10-18: qty 1

## 2014-10-18 MED ORDER — PHENYLEPHRINE HCL 10 % OP SOLN
OPHTHALMIC | Status: AC
  Administered 2014-10-18: 07:00:00
  Filled 2014-10-18: qty 5

## 2014-10-18 MED ORDER — CYCLOPENTOLATE HCL 2 % OP SOLN
1.0000 [drp] | OPHTHALMIC | Status: AC | PRN
  Administered 2014-10-18 (×4): 1 [drp] via OPHTHALMIC

## 2014-10-18 MED ORDER — FENTANYL CITRATE (PF) 100 MCG/2ML IJ SOLN: 25.0000 ug | INTRAMUSCULAR | Status: DC | PRN

## 2014-10-18 MED ORDER — MOXIFLOXACIN HCL 0.5 % OP SOLN
OPHTHALMIC | Status: DC | PRN
  Administered 2014-10-18: 1 [drp] via OPHTHALMIC

## 2014-10-18 MED ORDER — CEFUROXIME OPHTHALMIC INJECTION 1 MG/0.1 ML
INJECTION | OPHTHALMIC | Status: DC | PRN
  Administered 2014-10-18: 0.1 mL via INTRACAMERAL

## 2014-10-18 MED ORDER — EPINEPHRINE HCL 1 MG/ML IJ SOLN
INTRAMUSCULAR | Status: AC
  Filled 2014-10-18: qty 1

## 2014-10-18 MED ORDER — ALFENTANIL 500 MCG/ML IJ INJ
INJECTION | INTRAMUSCULAR | Status: DC | PRN
  Administered 2014-10-18: 250 ug via INTRAVENOUS

## 2014-10-18 MED ORDER — MOXIFLOXACIN HCL 0.5 % OP SOLN
OPHTHALMIC | Status: AC
  Administered 2014-10-18: 07:00:00
  Filled 2014-10-18: qty 3

## 2014-10-18 MED ORDER — CYCLOPENTOLATE HCL 2 % OP SOLN
OPHTHALMIC | Status: AC
  Administered 2014-10-18: 07:00:00
  Filled 2014-10-18: qty 2

## 2014-10-18 MED ORDER — NA CHONDROIT SULF-NA HYALURON 40-17 MG/ML IO SOLN
INTRAOCULAR | Status: DC | PRN
  Administered 2014-10-18: 1 mL via INTRAOCULAR

## 2014-10-18 MED ORDER — NA CHONDROIT SULF-NA HYALURON 40-17 MG/ML IO SOLN
INTRAOCULAR | Status: AC
  Filled 2014-10-18: qty 1

## 2014-10-18 MED ORDER — LIDOCAINE HCL (PF) 4 % IJ SOLN
INTRAMUSCULAR | Status: AC
  Filled 2014-10-18: qty 5

## 2014-10-18 MED ORDER — BUPIVACAINE HCL (PF) 0.75 % IJ SOLN
INTRAMUSCULAR | Status: AC
  Filled 2014-10-18: qty 10

## 2014-10-18 MED ORDER — CARBACHOL 0.01 % IO SOLN
INTRAOCULAR | Status: DC | PRN
  Administered 2014-10-18: 0.5 mL via INTRAOCULAR

## 2014-10-18 MED ORDER — SODIUM CHLORIDE 0.9 % IV SOLN
INTRAVENOUS | Status: DC
  Administered 2014-10-18: 07:00:00 via INTRAVENOUS

## 2014-10-18 MED ORDER — TETRACAINE HCL 0.5 % OP SOLN
OPHTHALMIC | Status: AC
  Filled 2014-10-18: qty 2

## 2014-10-18 MED ORDER — METOCLOPRAMIDE HCL 5 MG/ML IJ SOLN: 10.0000 mg | Freq: Once | INTRAMUSCULAR | Status: DC | PRN

## 2014-10-18 MED ORDER — TETRACAINE HCL 0.5 % OP SOLN
OPHTHALMIC | Status: DC | PRN
  Administered 2014-10-18: 2 [drp] via OPHTHALMIC

## 2014-10-18 MED ORDER — LIDOCAINE HCL (PF) 4 % IJ SOLN
INTRAMUSCULAR | Status: DC | PRN
  Administered 2014-10-18: 4 mL via OPHTHALMIC

## 2014-10-18 MED ORDER — MOXIFLOXACIN HCL 0.5 % OP SOLN
1.0000 [drp] | OPHTHALMIC | Status: AC | PRN
  Administered 2014-10-18 (×3): 1 [drp] via OPHTHALMIC

## 2014-10-18 SURGICAL SUPPLY — 30 items
CANNULA ANT/CHMB 27G (MISCELLANEOUS) ×1 IMPLANT
CANNULA ANT/CHMB 27GA (MISCELLANEOUS) ×2 IMPLANT
CORD BIP STRL DISP 12FT (MISCELLANEOUS) ×2 IMPLANT
CUP MEDICINE 2OZ PLAST GRAD ST (MISCELLANEOUS) ×2 IMPLANT
DRAPE XRAY CASSETTE 23X24 (DRAPES) ×2 IMPLANT
ERASER HMR WETFIELD 18G (MISCELLANEOUS) ×2 IMPLANT
GLOVE BIO SURGEON STRL SZ8 (GLOVE) ×2 IMPLANT
GLOVE SURG LX 6.5 MICRO (GLOVE) ×1
GLOVE SURG LX 8.0 MICRO (GLOVE) ×1
GLOVE SURG LX STRL 6.5 MICRO (GLOVE) ×1 IMPLANT
GLOVE SURG LX STRL 8.0 MICRO (GLOVE) ×1 IMPLANT
GOWN STRL REUS W/ TWL LRG LVL3 (GOWN DISPOSABLE) ×1 IMPLANT
GOWN STRL REUS W/ TWL XL LVL3 (GOWN DISPOSABLE) ×1 IMPLANT
GOWN STRL REUS W/TWL LRG LVL3 (GOWN DISPOSABLE) ×2
GOWN STRL REUS W/TWL XL LVL3 (GOWN DISPOSABLE) ×2
LENS IOL ACRYSOF IQ 20.0 (Intraocular Lens) ×1 IMPLANT
PACK CATARACT (MISCELLANEOUS) ×2 IMPLANT
PACK CATARACT DINGLEDEIN LX (MISCELLANEOUS) ×2 IMPLANT
PACK EYE AFTER SURG (MISCELLANEOUS) ×2 IMPLANT
SHLD EYE VISITEC  UNIV (MISCELLANEOUS) ×2 IMPLANT
SOL BSS BAG (MISCELLANEOUS) ×2
SOL PREP PVP 2OZ (MISCELLANEOUS) ×2
SOLUTION BSS BAG (MISCELLANEOUS) ×1 IMPLANT
SOLUTION PREP PVP 2OZ (MISCELLANEOUS) ×1 IMPLANT
SUT SILK 5-0 (SUTURE) ×2 IMPLANT
SYR 3ML LL SCALE MARK (SYRINGE) ×2 IMPLANT
SYR 5ML LL (SYRINGE) ×2 IMPLANT
SYR TB 1ML 27GX1/2 LL (SYRINGE) ×2 IMPLANT
WATER STERILE IRR 1000ML POUR (IV SOLUTION) ×2 IMPLANT
WIPE NON LINTING 3.25X3.25 (MISCELLANEOUS) ×2 IMPLANT

## 2014-10-18 NOTE — H&P (Signed)
  See scanned notes. 

## 2014-10-18 NOTE — Anesthesia Postprocedure Evaluation (Signed)
  Anesthesia Post-op Note  Patient: Todd Spencer  Procedure(s) Performed: Procedure(s) with comments: CATARACT EXTRACTION PHACO AND INTRAOCULAR LENS PLACEMENT (IOC) (Left) - [place]: 01L09.4 AP%: 24.2 CDE: 28.45 Lot # [id number]  Anesthesia type:No value filed.  Patient location: PACU  Post pain: Pain level controlled  Post assessment: Post-op Vital signs reviewed, Patient's Cardiovascular Status Stable, Respiratory Function Stable, Patent Airway and No signs of Nausea or vomiting  Post vital signs: Reviewed and stable  Last Vitals:  Filed Vitals:   10/18/14 0903  BP: 137/89  Pulse:   Temp: 36.3 C  Resp: 16    Level of consciousness: awake, alert  and patient cooperative  Complications: No apparent anesthesia complications

## 2014-10-18 NOTE — Anesthesia Preprocedure Evaluation (Addendum)
Anesthesia Evaluation  Patient identified by MRN, date of birth, ID band Patient awake    Reviewed: Allergy & Precautions, NPO status , Patient's Chart, lab work & pertinent test results  Airway Mallampati: III  TM Distance: >3 FB Neck ROM: Limited    Dental  (+) Teeth Intact   Pulmonary sleep apnea , former smoker,    Pulmonary exam normal       Cardiovascular Exercise Tolerance: Poor hypertension, Pt. on medications and Pt. on home beta blockers + angina with exertion + CAD, + Past MI, + Cardiac Stents, + CABG and +CHF Normal cardiovascular exam    Neuro/Psych    GI/Hepatic hiatal hernia,   Endo/Other  diabetes, Poorly Controlled, Type 2  Renal/GU      Musculoskeletal   Abdominal (+) + obese,   Peds  Hematology   Anesthesia Other Findings   Reproductive/Obstetrics                            Anesthesia Physical Anesthesia Plan  ASA: IV  Anesthesia Plan: MAC   Post-op Pain Management:    Induction: Intravenous  Airway Management Planned: Nasal Cannula  Additional Equipment:   Intra-op Plan:   Post-operative Plan:   Informed Consent: I have reviewed the patients History and Physical, chart, labs and discussed the procedure including the risks, benefits and alternatives for the proposed anesthesia with the patient or authorized representative who has indicated his/her understanding and acceptance.     Plan Discussed with: CRNA  Anesthesia Plan Comments: (BG 265, CABG, stent CHF, EF35% and schedule for ICD/pacemaker.)       Anesthesia Quick Evaluation

## 2014-10-18 NOTE — Op Note (Signed)
Date of Surgery: 10/18/2014 Date of Dictation: 10/18/2014 8:53 AM Pre-operative Diagnosis:  Nuclear Sclerotic Cataract left Eye Post-operative Diagnosis: same Procedure performed: Extra-capsular Cataract Extraction (ECCE) with placement of a posterior chamber intraocular lens (IOL) left Eye IOL:  Implant Name Type Inv. Item Serial No. Manufacturer Lot No. LRB No. Used  LENS IOL ACRYSOF IQ 20.0 - [id number] Intraocular Lens LENS IOL ACRYSOF IQ 20.0 [id number] ALCON   Left 1   Anesthesia: 2% Lidocaine and 4% Marcaine in a 50/50 mixture with 10 unites/ml of Hylenex given as a peribulbar Anesthesiologist: Anesthesiologist: Elyse Hsu, MD CRNA: Demetrius Charity, CRNA; Jonna Clark, CRNA Complications: none Estimated Blood Loss: less than 1 ml  Description of procedure:  The patient was given anesthesia and sedation via intravenous access. The patient was then prepped and draped in the usual fashion. A 25-gauge needle was bent for initiating the capsulorhexis. A 5-0 silk suture was placed through the conjunctiva superior and inferiorly to serve as bridle sutures. Hemostasis was obtained at the superior limbus using an eraser cautery. A partial thickness groove was made at the anterior surgical limbus with a 64 Beaver blade and this was dissected anteriorly with an Avaya. The anterior chamber was entered at 10 o'clock with a 1.0 mm paracentesis knife and through the lamellar dissection with a 2.6 mm Alcon keratome. Epi-Shugarcaine 0.5 CC [9 cc BSS Plus (Alcon), 3 cc 4% preservative-free lidocaine (Hospira) and 4 cc 1:1000 preservative-free, bisulfite-free epinephrine] was injected via the paracentesis tract. DiscoVisc was injected to replace the aqueous and a continuous tear curvilinear capsulorhexis was performed using a bent 25-gauge needle.  Balance salt on a syringe was used to perform hydro-dissection and phacoemulsification was carried out using a divide and conquer technique.  Procedure(s) with comments: CATARACT EXTRACTION PHACO AND INTRAOCULAR LENS PLACEMENT (IOC) (Left) - [place]: 01L09.4 AP%: 24.2 CDE: 28.45 Lot # [id number]. Irrigation/aspiration was used to remove the residual cortex and the capsular bag was inflated with DiscoVisc. The intraocular lens was inserted into the capsular bag using a pre-loaded UltraSert Delivery System. Irrigation/aspiration was used to remove the residual DiscoVisc. The wound was inflated with balanced salt and checked for leaks. None were found. Miostat was injected via the paracentesis track and 0.1 ml of cefuroxime containing 1 mg of drug  was injected via the paracentesis track. The wound was checked for leaks again and none were found.   The bridal sutures were removed and two drops of Vigamox were placed on the eye. An eye shield was placed to protect the eye and the patient was discharged to the recovery area in good condition.   Ginnifer Creelman MD

## 2014-10-18 NOTE — Discharge Instructions (Addendum)
See handoutAMBULATORY SURGERY  DISCHARGE INSTRUCTIONS   1) The drugs that you were given will stay in your system until tomorrow so for the next 24 hours you should not:  A) Drive an automobile B) Make any legal decisions C) Drink any alcoholic beverage   2) You may resume regular meals tomorrow.  Today it is better to start with liquids and gradually work up to solid foods.  You may eat anything you prefer, but it is better to start with liquids, then soup and crackers, and gradually work up to solid foods.   3) Please notify your doctor immediately if you have any unusual bleeding, trouble breathing, redness and pain at the surgery site, drainage, fever, or pain not relieved by medication.    4) Additional Instructions:        Please contact your physician with any problems or Same Day Surgery at [phone], Monday through Friday 6 am to 4 pm, or [place] at [facility] number at [phone]. Eye Surgery Discharge Instructions  Expect mild scratchy sensation or mild soreness. DO NOT RUB YOUR EYE!  The day of surgery:  Minimal physical activity, but bed rest is not required  No reading, computer work, or close hand work  No bending, lifting, or straining.  May watch TV  For 24 hours:  No driving, legal decisions, or alcoholic beverages  Safety precautions  Eat anything you prefer: It is better to start with liquids, then soup then solid foods.  _____ Eye patch should be worn until postoperative exam tomorrow.  ____ Solar shield eyeglasses should be worn for comfort in the sunlight/patch while sleeping  Resume all regular medications including aspirin or Coumadin if these were discontinued prior to surgery. You may shower, bathe, shave, or wash your hair. Tylenol may be taken for mild discomfort.  Call your doctor if you experience significant pain, nausea, or vomiting, fever > 101 or other signs of infection. [phone] or [phone] Specific  instructions:  Follow-up Information    Follow up with DINGELDEIN,STEVEN, MD In 1 day.   Specialty:  Ophthalmology   Why:  August 30 at 9:15am   Contact information:   [address]   [place] [address] [phone]

## 2014-10-18 NOTE — Transfer of Care (Signed)
Immediate Anesthesia Transfer of Care Note  Patient: Todd Spencer  Procedure(s) Performed: Procedure(s) with comments: CATARACT EXTRACTION PHACO AND INTRAOCULAR LENS PLACEMENT (IOC) (Left) - [place]: 01L09.4 AP%: 24.2 CDE: 28.45 Lot # [id number]  Patient Location: PACU  Anesthesia Type:MAC  Level of Consciousness: awake, alert  and oriented  Airway & Oxygen Therapy: Patient Spontanous Breathing  Post-op Assessment: Report given to RN and Post -op Vital signs reviewed and stable  Post vital signs: Reviewed and stable  Last Vitals:  Filed Vitals:   10/18/14 0641  BP: 130/75  Pulse: 55  Temp: 36.6 C  Resp: 18  0900 BP 138/69 HR 55 Temp 50.9 Resp 20 Complications: No apparent anesthesia complications

## 2014-10-18 NOTE — Interval H&P Note (Signed)
History and Physical Interval Note:  10/18/2014 7:26 AM  Todd Spencer  has presented today for surgery, with the diagnosis of cataract  The various methods of treatment have been discussed with the patient and family. After consideration of risks, benefits and other options for treatment, the patient has consented to  Procedure(s): CATARACT EXTRACTION PHACO AND INTRAOCULAR LENS PLACEMENT ([place]) (Left) as a surgical intervention .  The patient's history has been reviewed, patient examined, no change in status, stable for surgery.  I have reviewed the patient's chart and labs.  Questions were answered to the patient's satisfaction.     [name]

## 2014-10-18 NOTE — Addendum Note (Signed)
Addendum  created 10/18/14 0908 by Elyse Hsu, MD   Modules edited: Anesthesia Events, Narrator, Notes Section, Orders   Narrator:  Narrator: Event Log Edited   Notes Section:  File: [id number]

## 2014-10-18 NOTE — Anesthesia Postprocedure Evaluation (Signed)
  Anesthesia Post-op Note  Patient: Todd Spencer  Procedure(s) Performed: Procedure(s) with comments: CATARACT EXTRACTION PHACO AND INTRAOCULAR LENS PLACEMENT (IOC) (Left) - [place]: 01L09.4 AP%: 24.2 CDE: 28.45 Lot # [id number]  Anesthesia type:No value filed.  Patient location: PACU  Post pain: Pain level controlled  Post assessment: Post-op Vital signs reviewed, Patient's Cardiovascular Status Stable, Respiratory Function Stable, Patent Airway and No signs of Nausea or vomiting  Post vital signs: Reviewed and stable  Last Vitals:  Filed Vitals:   10/18/14 0641  BP: 130/75  Pulse: 55  Temp: 36.6 C  Resp: 18   0900 BP 138/69 Pulse 55 Temp 97.3 Resp 20  Level of consciousness: awake, alert  and patient cooperative  Complications: No apparent anesthesia complications

## 2014-10-18 NOTE — Anesthesia Procedure Notes (Signed)
Procedure Name: MAC Performed by: Demetrius Charity Pre-anesthesia Checklist: Emergency Drugs available, Patient identified, Patient being monitored, Timeout performed and Suction available Oxygen Delivery Method: Nasal cannula

## 2014-10-19 ENCOUNTER — Ambulatory Visit: Payer: Self-pay | Admitting: Internal Medicine

## 2014-11-04 ENCOUNTER — Encounter: Payer: Self-pay | Admitting: Cardiology

## 2014-11-04 ENCOUNTER — Encounter
Admission: RE | Disposition: A | Discharge status: Home / Self Care | Payer: Self-pay | Source: Ambulatory Visit | Attending: Cardiology

## 2014-11-04 ENCOUNTER — Ambulatory Visit
Admission: RE | Admit: 2014-11-04 | Discharge: 2014-11-04 | Disposition: A | Discharge status: Home / Self Care | Payer: Medicare HMO | Source: Ambulatory Visit | Attending: Cardiology | Admitting: Cardiology

## 2014-11-04 DIAGNOSIS — Z87891 Personal history of nicotine dependence: Secondary | ICD-10-CM | POA: Diagnosis not present

## 2014-11-04 DIAGNOSIS — Z955 Presence of coronary angioplasty implant and graft: Secondary | ICD-10-CM | POA: Diagnosis not present

## 2014-11-04 DIAGNOSIS — Z79899 Other long term (current) drug therapy: Secondary | ICD-10-CM | POA: Insufficient documentation

## 2014-11-04 DIAGNOSIS — Z7982 Long term (current) use of aspirin: Secondary | ICD-10-CM | POA: Insufficient documentation

## 2014-11-04 DIAGNOSIS — I10 Essential (primary) hypertension: Secondary | ICD-10-CM | POA: Insufficient documentation

## 2014-11-04 DIAGNOSIS — I420 Dilated cardiomyopathy: Secondary | ICD-10-CM | POA: Insufficient documentation

## 2014-11-04 DIAGNOSIS — I5022 Chronic systolic (congestive) heart failure: Secondary | ICD-10-CM

## 2014-11-04 DIAGNOSIS — Z794 Long term (current) use of insulin: Secondary | ICD-10-CM | POA: Insufficient documentation

## 2014-11-04 DIAGNOSIS — E785 Hyperlipidemia, unspecified: Secondary | ICD-10-CM | POA: Diagnosis not present

## 2014-11-04 DIAGNOSIS — I2510 Atherosclerotic heart disease of native coronary artery without angina pectoris: Secondary | ICD-10-CM

## 2014-11-04 DIAGNOSIS — R0600 Dyspnea, unspecified: Secondary | ICD-10-CM | POA: Insufficient documentation

## 2014-11-04 DIAGNOSIS — I259 Chronic ischemic heart disease, unspecified: Secondary | ICD-10-CM | POA: Insufficient documentation

## 2014-11-04 DIAGNOSIS — I252 Old myocardial infarction: Secondary | ICD-10-CM | POA: Diagnosis not present

## 2014-11-04 DIAGNOSIS — G4730 Sleep apnea, unspecified: Secondary | ICD-10-CM | POA: Diagnosis not present

## 2014-11-04 DIAGNOSIS — E119 Type 2 diabetes mellitus without complications: Secondary | ICD-10-CM | POA: Diagnosis not present

## 2014-11-04 DIAGNOSIS — I255 Ischemic cardiomyopathy: Secondary | ICD-10-CM

## 2014-11-04 HISTORY — PX: CARDIAC CATHETERIZATION: SHX172

## 2014-11-04 LAB — BASIC METABOLIC PANEL
ANION GAP: 7 (ref 5–15)
BUN: 26 mg/dL — ABNORMAL HIGH (ref 6–20)
CO2: 27 mmol/L (ref 22–32)
Calcium: 9.3 mg/dL (ref 8.9–10.3)
Chloride: 106 mmol/L (ref 101–111)
Creatinine, Ser: 1.51 mg/dL — ABNORMAL HIGH (ref 0.61–1.24)
GFR, EST AFRICAN AMERICAN: 54 mL/min — AB (ref >60)
GFR, EST NON AFRICAN AMERICAN: 47 mL/min — AB (ref >60)
GLUCOSE: 100 mg/dL — AB (ref 65–99)
POTASSIUM: 4.1 mmol/L (ref 3.5–5.1)
SODIUM: 140 mmol/L (ref 135–145)

## 2014-11-04 LAB — CBC
HCT: 36.8 % — ABNORMAL LOW (ref 40.0–52.0)
Hemoglobin: 12.5 g/dL — ABNORMAL LOW (ref 13.0–18.0)
MCH: 29.6 pg (ref 26.0–34.0)
MCHC: 34.1 g/dL (ref 32.0–36.0)
MCV: 86.8 fL (ref 80.0–100.0)
PLATELETS: 117 10*3/uL — AB (ref 150–440)
RBC: 4.24 MIL/uL — AB (ref 4.40–5.90)
RDW: 15.4 % — ABNORMAL HIGH (ref 11.5–14.5)
WBC: 6.2 10*3/uL (ref 3.8–10.6)

## 2014-11-04 LAB — PROTIME-INR
INR: 1.03
PROTHROMBIN TIME: 13.7 s (ref 11.4–15.0)

## 2014-11-04 SURGERY — LEFT HEART CATH
Anesthesia: Moderate Sedation | Laterality: Left

## 2014-11-04 MED ORDER — ONDANSETRON HCL 4 MG/2ML IJ SOLN: 4.0000 mg | Freq: Four times a day (QID) | INTRAMUSCULAR | Status: DC | PRN

## 2014-11-04 MED ORDER — ACETAMINOPHEN 325 MG PO TABS: 650.0000 mg | ORAL_TABLET | ORAL | Status: DC | PRN

## 2014-11-04 MED ORDER — SODIUM CHLORIDE 0.9 % IJ SOLN: 3.0000 mL | Freq: Two times a day (BID) | INTRAMUSCULAR | Status: DC

## 2014-11-04 MED ORDER — FENTANYL CITRATE (PF) 100 MCG/2ML IJ SOLN
INTRAMUSCULAR | Status: DC | PRN
  Administered 2014-11-04: 50 ug via INTRAVENOUS

## 2014-11-04 MED ORDER — MIDAZOLAM HCL 2 MG/2ML IJ SOLN
INTRAMUSCULAR | Status: DC | PRN
  Administered 2014-11-04: 1 mg via INTRAVENOUS

## 2014-11-04 MED ORDER — SODIUM CHLORIDE 0.9 % WEIGHT BASED INFUSION: 3.0000 mL/kg/h | INTRAVENOUS | Status: DC

## 2014-11-04 MED ORDER — HEPARIN (PORCINE) IN NACL 2-0.9 UNIT/ML-% IJ SOLN
INTRAMUSCULAR | Status: AC
  Filled 2014-11-04: qty 1000

## 2014-11-04 MED ORDER — FENTANYL CITRATE (PF) 100 MCG/2ML IJ SOLN
INTRAMUSCULAR | Status: AC
Start: 2014-11-04 — End: 2014-11-04
  Filled 2014-11-04: qty 2

## 2014-11-04 MED ORDER — SODIUM CHLORIDE 0.9 % IV SOLN
INTRAVENOUS | Status: DC
  Administered 2014-11-04: 07:00:00 via INTRAVENOUS

## 2014-11-04 MED ORDER — SODIUM CHLORIDE 0.9 % IV SOLN: 250.0000 mL | INTRAVENOUS | Status: DC | PRN

## 2014-11-04 MED ORDER — SODIUM CHLORIDE 0.9 % IJ SOLN
3.0000 mL | INTRAMUSCULAR | Status: DC | PRN
Start: 2014-11-04 — End: 2014-11-04

## 2014-11-04 MED ORDER — IOHEXOL 300 MG/ML  SOLN
INTRAMUSCULAR | Status: DC | PRN
  Administered 2014-11-04: 110 mL via INTRA_ARTERIAL

## 2014-11-04 MED ORDER — MIDAZOLAM HCL 2 MG/2ML IJ SOLN
INTRAMUSCULAR | Status: AC
  Filled 2014-11-04: qty 2

## 2014-11-04 SURGICAL SUPPLY — 10 items
CATH INFINITI 5FR ANG PIGTAIL (CATHETERS) ×2 IMPLANT
CATH INFINITI 5FR JL4 (CATHETERS) ×2 IMPLANT
CATH INFINITI JR4 5F (CATHETERS) ×2 IMPLANT
DEVICE CLOSURE MYNXGRIP 5F (Vascular Products) ×1 IMPLANT
KIT MANI 3VAL PERCEP (MISCELLANEOUS) ×2 IMPLANT
NDL PERC 18GX7CM (NEEDLE) ×1 IMPLANT
NEEDLE PERC 18GX7CM (NEEDLE) ×2 IMPLANT
PACK CARDIAC CATH (CUSTOM PROCEDURE TRAY) ×2 IMPLANT
SHEATH AVANTI 5FR X 11CM (SHEATH) ×2 IMPLANT
WIRE EMERALD 3MM-J .035X150CM (WIRE) ×2 IMPLANT

## 2014-11-04 NOTE — Discharge Instructions (Signed)

## 2014-11-22 ENCOUNTER — Telehealth: Payer: Self-pay | Admitting: Internal Medicine

## 2014-11-22 NOTE — Telephone Encounter (Signed)
Patient came into office to discuss future icd insertion.  Patient is scheduled for November to see Dr. Caryl Spencer, and doesn't understand why.  He wants to see Dr. Caryl Spencer with a scalpel in hand ready to place icd if still needed as they have told he before.  Patient instructed that nurse would read Dr. Olin Pia notes and get more details as to why he is scheduled for outpatient appt and what the care plan is going to be.  Patient seems very anxious.  Patient is Todd Spencer patient and was told that he had to have Todd Comes do his procedure as [facility] EP K. Marcello Moores would not be able to do this here at [facility].     Please call patient with details of care plan. Patient seems very eager to get things done.

## 2014-11-22 NOTE — Telephone Encounter (Signed)
No answer, VM not set up. Unable to leave message. Will call back

## 2014-11-23 NOTE — Telephone Encounter (Signed)
S/w pt who is concerned that he will not see Dr. Caryl Comes again until November. Reviewed Dr. Olin Pia instructions at August OV. Pt had cardiac catheterization w/[facility] 9/15. Pt would like an appt sooner to discuss possible defibrillator. Scheduling moved appt to 11/3

## 2014-12-23 ENCOUNTER — Encounter: Payer: Self-pay | Admitting: Internal Medicine

## 2014-12-23 ENCOUNTER — Ambulatory Visit (INDEPENDENT_AMBULATORY_CARE_PROVIDER_SITE_OTHER): Payer: Medicare HMO | Admitting: Internal Medicine

## 2014-12-23 VITALS — BP 120/64 | HR 74 | Ht 71.0 in | Wt 231.2 lb

## 2014-12-23 DIAGNOSIS — I5022 Chronic systolic (congestive) heart failure: Secondary | ICD-10-CM

## 2014-12-23 MED ORDER — CARVEDILOL 6.25 MG PO TABS: 6.2500 mg | ORAL_TABLET | Freq: Two times a day (BID) | ORAL | Status: DC

## 2014-12-23 NOTE — Patient Instructions (Signed)
Medication Instructions: 1) Stop metoprolol 2) Start coreg (carvedilol) 6.25 mg one tablet by mouth twice daily ** Dr. Caryl Comes will speak with Dr. Josefa Half about stopping plavix- do not stop this until you here back from [place] or Dr. Josefa Half office.  Labwork: - none  Procedures/Testing: - none  Follow-Up: - Your physician recommends that you schedule a follow-up appointment in: 6 weeks with Dr. Caryl Comes.  Any Additional Special Instructions Will Be Listed Below (If Applicable). - none
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
## 2015-01-06 ENCOUNTER — Ambulatory Visit: Payer: Self-pay | Admitting: Internal Medicine

## 2015-01-07 ENCOUNTER — Telehealth: Payer: Self-pay

## 2015-01-07 NOTE — Telephone Encounter (Signed)
Pt called to inquire of order for MRI. I reviewed Dr. Olin Pia notes and informed pt that he was to f/u w/PCP for possible neurological eval. Pt states he saw Dr. Delight Stare at [facility] this week and nothing was mentioned of this. He states all he was told to do was f/u 3 months. Informed pt I would ask Dr. Caryl Comes to address the MRI of the brain question as he does not generally order this. Pt states he doesn't want to go to another doctor's appt before having MRI. Forward to [place] to advise.

## 2015-01-10 NOTE — Telephone Encounter (Signed)
Needs top have done through PCP  thx

## 2015-01-11 ENCOUNTER — Other Ambulatory Visit: Payer: Self-pay

## 2015-01-11 ENCOUNTER — Telehealth: Payer: Self-pay

## 2015-01-11 NOTE — Telephone Encounter (Signed)
Pt called, states Dr. Caryl Comes was supposed to order him a "vein scan" and an MRI. Please call and advise.

## 2015-01-11 NOTE — Telephone Encounter (Signed)
Per verbal from Dr. Caryl Comes, refer pt to neurology [facility] neurology. Pt has [employer] which requires him to have referral from PCP.  Called Dr. Delight Stare, PCP. Per Verdis Frederickson, receptionist, referral need to be given by Tye Maryland, care coordinator. Left message on Cathy's VM to CB

## 2015-01-11 NOTE — Telephone Encounter (Signed)
S/w pt with update on neurology update.  Informed patient that I am awaiting call back from PCP for possible neurology referral.  Pt verbalized understanding.

## 2015-01-17 ENCOUNTER — Telehealth: Payer: Self-pay

## 2015-01-17 NOTE — Telephone Encounter (Signed)
S/w Tye Maryland, care coordinator at Dr. Delight Stare office who states pt is seen through [facility] in [place] S/w Neoma Laming, [place] of pt's concern w/increasing confusion. Per Dr. Caryl Comes, he would like pt to have neurology consult before possible ICD placement.  Deborah reviewed 11/10 OV notes, pt did not express this concern. Pt would need to be seen again by PCP before neurology consult can be placed. Neoma Laming states she will make Dr. Lennox Grumbles aware of Dr. Olin Pia concerns.    S/w pt of need for PCP referral to neurology. Pt states he is not home at this time and will CB when he gets there to further discuss

## 2015-01-17 NOTE — Telephone Encounter (Signed)
S/w pt regarding need for referral to neurology for possible MRI before Dr. Caryl Comes would proceed w/ICD. Pt states he will call Dr. Lennox Grumbles' office tomorrow for appt d/t increased confusion. If Dr. Lennox Grumbles agreeable, she will place referral to [facility] Neurology (pt preference) Pt verbalized understanding w/no further questions.

## 2015-01-26 ENCOUNTER — Telehealth: Payer: Self-pay

## 2015-01-26 NOTE — Telephone Encounter (Signed)
S/w pt of plan for PCP OV for neurology referral. Pt states Matt at [facility] called him this morning w/10am appt today.  Matt called back to confirm that he spoke w/pt for 2:30pm appt tomorrow w/Linda Lennox Grumbles, MD. Informed Matt of my conversation w/pt who states he was told 10am today.  Pt appears confused as to the time/date of appt. Catalina Antigua states he will call pt again tomorrow AM to remind of appt time

## 2015-01-26 NOTE — Telephone Encounter (Signed)
Pt states he gave some "papers" that Dr. Caryl Comes had given to pt to be able to get MRI, so he can get his device . States he gave them to his PCP, and  Has not heard anything from them. States he has called his PCP regarding the papers, and he is not sure the status.

## 2015-01-26 NOTE — Telephone Encounter (Signed)
S/w Catalina Antigua, RN at [facility] of pt need for referral to neurology d/t increased confusion. Per Dr. Caryl Comes, pt will need clearance before proceeding w/ICD implantation. Pt insurance requires PCP referral, not cardiology, for neurology consult. Matt states there is a note in pt chart from my last conversation w/them and he understands what pt needs. States he will contact pt for appt to be seen in the office for referral

## 2015-01-28 ENCOUNTER — Telehealth: Payer: Self-pay

## 2015-01-28 NOTE — Telephone Encounter (Signed)
Todd Antigua, RN at [facility] called to inquire as to reason for referral to neurology as Dr. Lennox Grumbles is making the referral today. Reviewed Dr. Olin Pia notes. Matt appreciative of the information.  Notified pt that Dr. Jillyn Hidden office will make neurology referral and he should receive phone call regarding an appt. Pt inquires as to what an ICD is. Reviewed information and Dr. Olin Pia recommendations. Pt verbalized understanding with no further questions.

## 2015-01-28 NOTE — Telephone Encounter (Signed)
Pt is calling inquiring about his MRI. Please call.

## 2015-02-02 ENCOUNTER — Other Ambulatory Visit: Payer: Self-pay | Admitting: *Deleted

## 2015-02-02 MED ORDER — CARVEDILOL 6.25 MG PO TABS: 6.2500 mg | ORAL_TABLET | Freq: Two times a day (BID) | ORAL | Status: DC

## 2015-02-02 NOTE — Telephone Encounter (Signed)
Requested Prescriptions   Signed Prescriptions Disp Refills  . carvedilol (COREG) 6.25 MG tablet 180 tablet 3    Sig: Take 1 tablet (6.25 mg total) by mouth 2 (two) times daily.    Authorizing Provider: Deboraha Sprang    Ordering User: Britt Bottom

## 2015-02-04 ENCOUNTER — Other Ambulatory Visit: Payer: Self-pay | Admitting: *Deleted

## 2015-02-04 MED ORDER — CARVEDILOL 6.25 MG PO TABS: 6.2500 mg | ORAL_TABLET | Freq: Two times a day (BID) | ORAL | Status: DC

## 2015-03-03 ENCOUNTER — Ambulatory Visit: Payer: Self-pay | Admitting: Internal Medicine

## 2015-03-22 DIAGNOSIS — E1165 Type 2 diabetes mellitus with hyperglycemia: Secondary | ICD-10-CM

## 2015-03-22 DIAGNOSIS — IMO0001 Reserved for inherently not codable concepts without codable children: Secondary | ICD-10-CM | POA: Insufficient documentation

## 2015-04-14 ENCOUNTER — Ambulatory Visit: Payer: Self-pay | Admitting: Internal Medicine

## 2015-05-04 DIAGNOSIS — G309 Alzheimer's disease, unspecified: Secondary | ICD-10-CM

## 2015-05-04 DIAGNOSIS — F0280 Dementia in other diseases classified elsewhere without behavioral disturbance: Secondary | ICD-10-CM | POA: Insufficient documentation

## 2015-05-04 DIAGNOSIS — F0150 Vascular dementia without behavioral disturbance: Secondary | ICD-10-CM | POA: Insufficient documentation

## 2015-05-11 DIAGNOSIS — E669 Obesity, unspecified: Secondary | ICD-10-CM | POA: Insufficient documentation

## 2015-05-16 ENCOUNTER — Telehealth: Payer: Self-pay | Admitting: Internal Medicine

## 2015-05-16 NOTE — Telephone Encounter (Signed)
Patient says he has seen all the doctors that Caryl Comes had wanted him to see prior to putting in defib device.  Patient was referred to [facility] by primary cardio Dr. Saralyn Pilar at [facility] clinic.  Patient prefers not to wait until upcoming appt. In April but wants to instead schedule the procedure to put in the defib device.  Please call patient to discuss scheduling asap .

## 2015-05-18 NOTE — Telephone Encounter (Signed)
Need to review with MD prior to calling the patient back.

## 2015-05-20 NOTE — Telephone Encounter (Signed)
Attempted to call the patient.  Reviewed with Dr. Caryl Comes- he will still need to see the patient in the office prior to scheduling. It has been since November when we saw the patient. No answer/ no voice mail at the patient's number. Will call back.

## 2015-05-23 NOTE — Telephone Encounter (Signed)
I spoke with the patient. He is aware to keep his OV with Dr. Caryl Comes in [place] on 4/20. I will tentatively put him on for 4/26 for his device implant.

## 2015-06-09 ENCOUNTER — Ambulatory Visit (INDEPENDENT_AMBULATORY_CARE_PROVIDER_SITE_OTHER): Payer: PPO | Admitting: Internal Medicine

## 2015-06-09 ENCOUNTER — Encounter: Payer: Self-pay | Admitting: Internal Medicine

## 2015-06-09 ENCOUNTER — Encounter (INDEPENDENT_AMBULATORY_CARE_PROVIDER_SITE_OTHER): Payer: Self-pay

## 2015-06-09 VITALS — BP 90/50 | HR 63 | Ht 71.0 in | Wt 226.4 lb

## 2015-06-09 DIAGNOSIS — I5022 Chronic systolic (congestive) heart failure: Secondary | ICD-10-CM | POA: Diagnosis not present

## 2015-06-09 NOTE — Patient Instructions (Signed)
Medication Instructions: - Your physician recommends that you continue on your current medications as directed. Please refer to the Current Medication list given to you today.  Labwork: - none  Procedures/Testing: - none  Follow-Up: - pending echo from Dr. Josefa Half.  Any Additional Special Instructions Will Be Listed Below (If Applicable).     If you need a refill on your cardiac medications before your next appointment, please call your pharmacy.
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
## 2015-12-11 ENCOUNTER — Encounter: Payer: Self-pay | Admitting: Emergency Medicine

## 2015-12-11 DIAGNOSIS — Z955 Presence of coronary angioplasty implant and graft: Secondary | ICD-10-CM

## 2015-12-11 DIAGNOSIS — R109 Unspecified abdominal pain: Secondary | ICD-10-CM | POA: Diagnosis not present

## 2015-12-11 DIAGNOSIS — Z807 Family history of other malignant neoplasms of lymphoid, hematopoietic and related tissues: Secondary | ICD-10-CM

## 2015-12-11 DIAGNOSIS — I130 Hypertensive heart and chronic kidney disease with heart failure and stage 1 through stage 4 chronic kidney disease, or unspecified chronic kidney disease: Secondary | ICD-10-CM | POA: Diagnosis present

## 2015-12-11 DIAGNOSIS — I2510 Atherosclerotic heart disease of native coronary artery without angina pectoris: Secondary | ICD-10-CM | POA: Diagnosis present

## 2015-12-11 DIAGNOSIS — Z9049 Acquired absence of other specified parts of digestive tract: Secondary | ICD-10-CM

## 2015-12-11 DIAGNOSIS — I5022 Chronic systolic (congestive) heart failure: Secondary | ICD-10-CM | POA: Diagnosis present

## 2015-12-11 DIAGNOSIS — K5651 Intestinal adhesions [bands], with partial obstruction: Secondary | ICD-10-CM | POA: Diagnosis not present

## 2015-12-11 DIAGNOSIS — Z79899 Other long term (current) drug therapy: Secondary | ICD-10-CM

## 2015-12-11 DIAGNOSIS — Z87891 Personal history of nicotine dependence: Secondary | ICD-10-CM

## 2015-12-11 DIAGNOSIS — E119 Type 2 diabetes mellitus without complications: Secondary | ICD-10-CM | POA: Diagnosis present

## 2015-12-11 DIAGNOSIS — I252 Old myocardial infarction: Secondary | ICD-10-CM

## 2015-12-11 DIAGNOSIS — Z794 Long term (current) use of insulin: Secondary | ICD-10-CM

## 2015-12-11 DIAGNOSIS — E785 Hyperlipidemia, unspecified: Secondary | ICD-10-CM | POA: Diagnosis present

## 2015-12-11 DIAGNOSIS — H9190 Unspecified hearing loss, unspecified ear: Secondary | ICD-10-CM | POA: Diagnosis present

## 2015-12-11 DIAGNOSIS — Z8249 Family history of ischemic heart disease and other diseases of the circulatory system: Secondary | ICD-10-CM

## 2015-12-11 DIAGNOSIS — N183 Chronic kidney disease, stage 3 (moderate): Secondary | ICD-10-CM | POA: Diagnosis present

## 2015-12-11 DIAGNOSIS — G4733 Obstructive sleep apnea (adult) (pediatric): Secondary | ICD-10-CM | POA: Diagnosis present

## 2015-12-11 LAB — COMPREHENSIVE METABOLIC PANEL
ALBUMIN: 4.2 g/dL (ref 3.5–5.0)
ALK PHOS: 58 U/L (ref 38–126)
ALT: 17 U/L (ref 17–63)
AST: 20 U/L (ref 15–41)
Anion gap: 7 (ref 5–15)
BILIRUBIN TOTAL: 1 mg/dL (ref 0.3–1.2)
BUN: 27 mg/dL — AB (ref 6–20)
CALCIUM: 9.2 mg/dL (ref 8.9–10.3)
CO2: 29 mmol/L (ref 22–32)
CREATININE: 1.47 mg/dL — AB (ref 0.61–1.24)
Chloride: 101 mmol/L (ref 101–111)
GFR calc Af Amer: 55 mL/min — ABNORMAL LOW (ref >60)
GFR calc non Af Amer: 48 mL/min — ABNORMAL LOW (ref >60)
GLUCOSE: 168 mg/dL — AB (ref 65–99)
Potassium: 4.8 mmol/L (ref 3.5–5.1)
Sodium: 137 mmol/L (ref 135–145)
TOTAL PROTEIN: 7.8 g/dL (ref 6.5–8.1)

## 2015-12-11 LAB — CBC
HCT: 40.6 % (ref 40.0–52.0)
Hemoglobin: 13.7 g/dL (ref 13.0–18.0)
MCH: 27.9 pg (ref 26.0–34.0)
MCHC: 33.7 g/dL (ref 32.0–36.0)
MCV: 82.7 fL (ref 80.0–100.0)
Platelets: 118 10*3/uL — ABNORMAL LOW (ref 150–440)
RBC: 4.91 MIL/uL (ref 4.40–5.90)
RDW: 15.5 % — AB (ref 11.5–14.5)
WBC: 5.6 10*3/uL (ref 3.8–10.6)

## 2015-12-11 LAB — LIPASE, BLOOD: Lipase: 26 U/L (ref 11–51)

## 2015-12-11 NOTE — ED Triage Notes (Signed)
Pt c/o abdominal pain and distention that started this morning when he woke up. Pt c/o diarrhea and bloating in his stomach. Pt denies any blood in his diarrhea, denies any N/V at this time.

## 2015-12-12 ENCOUNTER — Inpatient Hospital Stay
Admission: EM | Admit: 2015-12-12 | Discharge: 2015-12-14 | DRG: 389 | Disposition: A | Discharge status: Home / Self Care | Payer: PPO | Attending: Surgery | Admitting: Surgery

## 2015-12-12 ENCOUNTER — Emergency Department: Payer: PPO

## 2015-12-12 ENCOUNTER — Encounter: Payer: Self-pay | Admitting: Surgery

## 2015-12-12 DIAGNOSIS — N183 Chronic kidney disease, stage 3 unspecified: Secondary | ICD-10-CM | POA: Insufficient documentation

## 2015-12-12 DIAGNOSIS — E1142 Type 2 diabetes mellitus with diabetic polyneuropathy: Secondary | ICD-10-CM | POA: Insufficient documentation

## 2015-12-12 DIAGNOSIS — K5909 Other constipation: Secondary | ICD-10-CM

## 2015-12-12 DIAGNOSIS — K5651 Intestinal adhesions [bands], with partial obstruction: Secondary | ICD-10-CM | POA: Diagnosis not present

## 2015-12-12 DIAGNOSIS — E785 Hyperlipidemia, unspecified: Secondary | ICD-10-CM | POA: Diagnosis present

## 2015-12-12 DIAGNOSIS — Z955 Presence of coronary angioplasty implant and graft: Secondary | ICD-10-CM | POA: Diagnosis not present

## 2015-12-12 DIAGNOSIS — I5022 Chronic systolic (congestive) heart failure: Secondary | ICD-10-CM | POA: Diagnosis present

## 2015-12-12 DIAGNOSIS — Z87891 Personal history of nicotine dependence: Secondary | ICD-10-CM | POA: Diagnosis not present

## 2015-12-12 DIAGNOSIS — K565 Intestinal adhesions [bands], unspecified as to partial versus complete obstruction: Secondary | ICD-10-CM | POA: Diagnosis not present

## 2015-12-12 DIAGNOSIS — Z8249 Family history of ischemic heart disease and other diseases of the circulatory system: Secondary | ICD-10-CM | POA: Diagnosis not present

## 2015-12-12 DIAGNOSIS — E119 Type 2 diabetes mellitus without complications: Secondary | ICD-10-CM | POA: Diagnosis present

## 2015-12-12 DIAGNOSIS — G4733 Obstructive sleep apnea (adult) (pediatric): Secondary | ICD-10-CM | POA: Diagnosis present

## 2015-12-12 DIAGNOSIS — Z807 Family history of other malignant neoplasms of lymphoid, hematopoietic and related tissues: Secondary | ICD-10-CM | POA: Diagnosis not present

## 2015-12-12 DIAGNOSIS — Z79899 Other long term (current) drug therapy: Secondary | ICD-10-CM | POA: Diagnosis not present

## 2015-12-12 DIAGNOSIS — R109 Unspecified abdominal pain: Secondary | ICD-10-CM | POA: Diagnosis not present

## 2015-12-12 DIAGNOSIS — I130 Hypertensive heart and chronic kidney disease with heart failure and stage 1 through stage 4 chronic kidney disease, or unspecified chronic kidney disease: Secondary | ICD-10-CM | POA: Diagnosis present

## 2015-12-12 DIAGNOSIS — Z9049 Acquired absence of other specified parts of digestive tract: Secondary | ICD-10-CM | POA: Diagnosis not present

## 2015-12-12 DIAGNOSIS — H9190 Unspecified hearing loss, unspecified ear: Secondary | ICD-10-CM | POA: Diagnosis present

## 2015-12-12 DIAGNOSIS — I2510 Atherosclerotic heart disease of native coronary artery without angina pectoris: Secondary | ICD-10-CM | POA: Diagnosis present

## 2015-12-12 DIAGNOSIS — Z794 Long term (current) use of insulin: Secondary | ICD-10-CM | POA: Diagnosis not present

## 2015-12-12 DIAGNOSIS — I252 Old myocardial infarction: Secondary | ICD-10-CM | POA: Diagnosis not present

## 2015-12-12 HISTORY — DX: Chronic kidney disease, stage 3 (moderate): N18.3

## 2015-12-12 HISTORY — DX: Atherosclerotic heart disease of native coronary artery without angina pectoris: I25.10

## 2015-12-12 HISTORY — DX: Chronic kidney disease, stage 3 unspecified: N18.30

## 2015-12-12 LAB — GLUCOSE, CAPILLARY
GLUCOSE-CAPILLARY: 128 mg/dL — AB (ref 65–99)
GLUCOSE-CAPILLARY: 109 mg/dL — AB (ref 65–99)

## 2015-12-12 LAB — URINALYSIS COMPLETE WITH MICROSCOPIC (ARMC ONLY)
BACTERIA UA: NONE SEEN
BILIRUBIN URINE: NEGATIVE
GLUCOSE, UA: 50 mg/dL — AB
Hgb urine dipstick: NEGATIVE
KETONES UR: NEGATIVE mg/dL
Leukocytes, UA: NEGATIVE
Nitrite: NEGATIVE
PH: 7 (ref 5.0–8.0)
Protein, ur: NEGATIVE mg/dL
RBC / HPF: NONE SEEN RBC/hpf (ref 0–5)
Specific Gravity, Urine: 1.016 (ref 1.005–1.030)

## 2015-12-12 LAB — LACTIC ACID, PLASMA
LACTIC ACID, VENOUS: 1.6 mmol/L (ref 0.5–1.9)
Lactic Acid, Venous: 2.1 mmol/L (ref 0.5–1.9)

## 2015-12-12 MED ORDER — SODIUM CHLORIDE 0.9 % IV BOLUS (SEPSIS)
1000.0000 mL | Freq: Once | INTRAVENOUS | Status: AC
  Administered 2015-12-12: 1000 mL via INTRAVENOUS

## 2015-12-12 MED ORDER — ONDANSETRON HCL 4 MG/2ML IJ SOLN: 4.0000 mg | Freq: Four times a day (QID) | INTRAMUSCULAR | Status: DC | PRN

## 2015-12-12 MED ORDER — INSULIN ASPART 100 UNIT/ML ~~LOC~~ SOLN: 0.0000 [IU] | Freq: Every day | SUBCUTANEOUS | Status: DC

## 2015-12-12 MED ORDER — KETOROLAC TROMETHAMINE 30 MG/ML IJ SOLN: 30.0000 mg | Freq: Four times a day (QID) | INTRAMUSCULAR | Status: DC

## 2015-12-12 MED ORDER — IOPAMIDOL (ISOVUE-300) INJECTION 61%
30.0000 mL | Freq: Once | INTRAVENOUS | Status: AC | PRN
  Administered 2015-12-12: 30 mL via ORAL

## 2015-12-12 MED ORDER — FAMOTIDINE IN NACL 20-0.9 MG/50ML-% IV SOLN
20.0000 mg | Freq: Two times a day (BID) | INTRAVENOUS | Status: DC
  Administered 2015-12-12 – 2015-12-13 (×4): 20 mg via INTRAVENOUS
  Filled 2015-12-12 (×6): qty 50

## 2015-12-12 MED ORDER — IOPAMIDOL (ISOVUE-300) INJECTION 61%
125.0000 mL | Freq: Once | INTRAVENOUS | Status: AC | PRN
  Administered 2015-12-12: 125 mL via INTRAVENOUS

## 2015-12-12 MED ORDER — HYDROMORPHONE HCL 1 MG/ML IJ SOLN
0.5000 mg | INTRAMUSCULAR | Status: DC | PRN
  Administered 2015-12-12: 0.5 mg via INTRAVENOUS
  Filled 2015-12-12: qty 1

## 2015-12-12 MED ORDER — INSULIN ASPART 100 UNIT/ML ~~LOC~~ SOLN
0.0000 [IU] | Freq: Three times a day (TID) | SUBCUTANEOUS | Status: DC
  Administered 2015-12-12: 2 [IU] via SUBCUTANEOUS
  Administered 2015-12-14: 3 [IU] via SUBCUTANEOUS
  Filled 2015-12-12: qty 2
  Filled 2015-12-12: qty 3

## 2015-12-12 MED ORDER — ENOXAPARIN SODIUM 40 MG/0.4ML ~~LOC~~ SOLN
40.0000 mg | SUBCUTANEOUS | Status: DC
  Administered 2015-12-12 – 2015-12-14 (×3): 40 mg via SUBCUTANEOUS
  Filled 2015-12-12 (×3): qty 0.4

## 2015-12-12 MED ORDER — ONDANSETRON 4 MG PO TBDP: 4.0000 mg | ORAL_TABLET | Freq: Four times a day (QID) | ORAL | Status: DC | PRN

## 2015-12-12 MED ORDER — LACTATED RINGERS IV SOLN
INTRAVENOUS | Status: DC
  Administered 2015-12-12 – 2015-12-13 (×5): via INTRAVENOUS

## 2015-12-12 MED ORDER — KETOROLAC TROMETHAMINE 30 MG/ML IJ SOLN
15.0000 mg | Freq: Four times a day (QID) | INTRAMUSCULAR | Status: DC
  Administered 2015-12-12 – 2015-12-13 (×5): 15 mg via INTRAVENOUS
  Filled 2015-12-12 (×5): qty 1
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
## 2015-12-12 NOTE — ED Notes (Signed)
X-ray at bedside
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
## 2015-12-12 NOTE — Progress Notes (Signed)
Pt was laying on side but welcomed [place] to come in. Pt lives alone and has been on disability for some time. 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  Hunting season opens Saturday and Pt has strong desire to get out of hospital and be in his tree stand on Saturday! CH offered prayer.   12/12/15 1100  Clinical Encounter Type  Visited With Patient  Visit Type Initial;Spiritual support  Spiritual Encounters  Spiritual Needs Prayer;Emotional  Stress Factors  Patient Stress Factors Exhausted
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
## 2015-12-12 NOTE — ED Notes (Signed)
Pt reports he normally has bm's every morning. Pt reports he did not have one this morning and so took a laxative. Pt reports 6 episodes of diarrhea since and abdominal cramping. Pt is tender to palpation of all quadrants. Pt's abdomen is distended. Pt rates his abdominal pain at a 7 out of 10, however reports decrease in pain when he passes gas. Pt worried about the possibility of appendicitis. PT states: "my cousin had appendicitis, waited too long and it ruptured. He died. I hope I don't have it". Pt denies N/V.

## 2015-12-13 LAB — BASIC METABOLIC PANEL
ANION GAP: 6 (ref 5–15)
BUN: 35 mg/dL — ABNORMAL HIGH (ref 6–20)
CO2: 26 mmol/L (ref 22–32)
Calcium: 8.1 mg/dL — ABNORMAL LOW (ref 8.9–10.3)
Chloride: 105 mmol/L (ref 101–111)
Creatinine, Ser: 1.68 mg/dL — ABNORMAL HIGH (ref 0.61–1.24)
GFR calc Af Amer: 47 mL/min — ABNORMAL LOW (ref >60)
GFR, EST NON AFRICAN AMERICAN: 40 mL/min — AB (ref >60)
GLUCOSE: 118 mg/dL — AB (ref 65–99)
POTASSIUM: 4.2 mmol/L (ref 3.5–5.1)
Sodium: 137 mmol/L (ref 135–145)

## 2015-12-13 LAB — GLUCOSE, CAPILLARY
GLUCOSE-CAPILLARY: 104 mg/dL — AB (ref 65–99)
GLUCOSE-CAPILLARY: 108 mg/dL — AB (ref 65–99)
GLUCOSE-CAPILLARY: 149 mg/dL — AB (ref 65–99)
Glucose-Capillary: 106 mg/dL — ABNORMAL HIGH (ref 65–99)

## 2015-12-13 LAB — CBC
HEMATOCRIT: 33.2 % — AB (ref 40.0–52.0)
HEMOGLOBIN: 11.6 g/dL — AB (ref 13.0–18.0)
MCH: 28.4 pg (ref 26.0–34.0)
MCHC: 34.8 g/dL (ref 32.0–36.0)
MCV: 81.6 fL (ref 80.0–100.0)
Platelets: 81 10*3/uL — ABNORMAL LOW (ref 150–440)
RBC: 4.07 MIL/uL — AB (ref 4.40–5.90)
RDW: 15.3 % — ABNORMAL HIGH (ref 11.5–14.5)
WBC: 4.3 10*3/uL (ref 3.8–10.6)

## 2015-12-13 LAB — HEMOGLOBIN A1C
Hgb A1c MFr Bld: 9.5 % — ABNORMAL HIGH (ref 4.8–5.6)
Mean Plasma Glucose: 226 mg/dL

## 2015-12-13 MED ORDER — DOXEPIN HCL 100 MG PO CAPS
200.0000 mg | ORAL_CAPSULE | Freq: Every day | ORAL | Status: DC
Start: 2015-12-13 — End: 2015-12-14
  Administered 2015-12-13: 200 mg via ORAL
  Filled 2015-12-13 (×2): qty 2

## 2015-12-13 MED ORDER — DOXEPIN HCL 100 MG PO CAPS
100.0000 mg | ORAL_CAPSULE | Freq: Every morning | ORAL | Status: DC
  Administered 2015-12-13: 100 mg via ORAL
  Filled 2015-12-13 (×2): qty 1

## 2015-12-13 MED ORDER — PREGABALIN 75 MG PO CAPS
300.0000 mg | ORAL_CAPSULE | Freq: Two times a day (BID) | ORAL | Status: DC
  Administered 2015-12-13 – 2015-12-14 (×3): 300 mg via ORAL
  Filled 2015-12-13 (×3): qty 4

## 2015-12-13 MED ORDER — ACETAMINOPHEN 325 MG PO TABS
650.0000 mg | ORAL_TABLET | Freq: Four times a day (QID) | ORAL | Status: DC | PRN
  Filled 2015-12-13: qty 2

## 2015-12-13 MED ORDER — LISINOPRIL 5 MG PO TABS
5.0000 mg | ORAL_TABLET | Freq: Every day | ORAL | Status: DC
  Administered 2015-12-13 – 2015-12-14 (×2): 5 mg via ORAL
  Filled 2015-12-13 (×2): qty 1

## 2015-12-13 MED ORDER — CARVEDILOL 6.25 MG PO TABS
6.2500 mg | ORAL_TABLET | Freq: Two times a day (BID) | ORAL | Status: DC
  Administered 2015-12-13 – 2015-12-14 (×3): 6.25 mg via ORAL
  Filled 2015-12-13 (×3): qty 1

## 2015-12-13 MED ORDER — ISOSORBIDE DINITRATE 30 MG PO TABS
30.0000 mg | ORAL_TABLET | Freq: Three times a day (TID) | ORAL | Status: DC
  Administered 2015-12-13 – 2015-12-14 (×3): 30 mg via ORAL
  Filled 2015-12-13 (×6): qty 1

## 2015-12-13 MED ORDER — DONEPEZIL HCL 5 MG PO TABS
5.0000 mg | ORAL_TABLET | Freq: Every day | ORAL | Status: DC
  Administered 2015-12-13: 5 mg via ORAL
  Filled 2015-12-13: qty 1

## 2015-12-13 MED ORDER — OXYCODONE HCL 5 MG PO TABS
5.0000 mg | ORAL_TABLET | Freq: Four times a day (QID) | ORAL | Status: DC | PRN
  Administered 2015-12-13: 5 mg via ORAL
  Filled 2015-12-13: qty 1

## 2015-12-13 NOTE — Progress Notes (Signed)
NGT removed per MD order.

## 2015-12-13 NOTE — Progress Notes (Signed)
12/13/2015  Subjective: No acute events overnight. Patient was admitted yesterday with a small bowel obstruction and had NG tube placed. This drained 600 mL yesterday. She reports that his abdominal distention has improved over the course of the day reports that he was starting to pass gas as well. Denies any nausea, vomiting, or worsening abdominal pain  Vital signs: Temp:  [98.5 F (36.9 C)-99.3 F (37.4 C)] 99.1 F (37.3 C) (10/24 0443) Pulse Rate:  [98-104] 98 (10/24 0443) Resp:  [20] 20 (10/24 0443) BP: (138-159)/(62-97) 157/64 (10/24 0443) SpO2:  [95 %-98 %] 95 % (10/24 0443)   Intake/Output: 10/23 0701 - 10/24 0700 In: 3339 [I.V.:3209; NG/GT:80; IV Piggyback:50] Out: 1100 [Urine:500; Emesis/NG output:600] Last BM Date: 12/12/15  Physical Exam: Constitutional: No acute distress Abdomen: Soft, back to his baseline of distention, with no tenderness to palpation. NG tube in place and working well.  Labs:   Recent Labs  12/11/15 2148 12/13/15 0513  WBC 5.6 4.3  HGB 13.7 11.6*  HCT 40.6 33.2*  PLT 118* 81*    Recent Labs  12/11/15 2148 12/13/15 0513  NA 137 137  K 4.8 4.2  CL 101 105  CO2 29 26  GLUCOSE 168* 118*  BUN 27* 35*  CREATININE 1.47* 1.68*  CALCIUM 9.2 8.1*   No results for input(s): LABPROT, INR in the last 72 hours.  Imaging: No results found.  Assessment/Plan: 64 year old male with small bowel obstruction.  -Now that the patient is starting to pass flatus, we'll likely end up removing the NG tube today. If that is the case will start the patient on sips and ice chips today and start advancing his diet later tomorrow. Burnis Medin restart some of this is essential cardiac medications today. Continue sliding scale insulin. -Appreciate internal medicine's input.   Melvyn Neth, [place]

## 2015-12-13 NOTE — Progress Notes (Signed)
NG tube was clamped for PO medications. After hooking NG back up to suction, no immediate output came out.
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
## 2015-12-13 NOTE — Progress Notes (Signed)
Pt had shown improvement and feeling better about medicines taking efffect and not needing surgery. Pt spoke of love of outdorrs and hunting.  Pt requested prayer which [place] offered.   12/13/15 1250  Clinical Encounter Type  Visited With Patient  Visit Type Follow-up  Referral From Nurse  Spiritual Encounters  Spiritual Needs Prayer;Emotional  Stress Factors  Patient Stress Factors None identified

## 2015-12-14 LAB — GLUCOSE, CAPILLARY
GLUCOSE-CAPILLARY: 187 mg/dL — AB (ref 65–99)
Glucose-Capillary: 109 mg/dL — ABNORMAL HIGH (ref 65–99)

## 2015-12-14 LAB — CBC
HEMATOCRIT: 30.2 % — AB (ref 40.0–52.0)
Hemoglobin: 10.3 g/dL — ABNORMAL LOW (ref 13.0–18.0)
MCH: 27.9 pg (ref 26.0–34.0)
MCHC: 34.1 g/dL (ref 32.0–36.0)
MCV: 81.7 fL (ref 80.0–100.0)
PLATELETS: 93 10*3/uL — AB (ref 150–440)
RBC: 3.7 MIL/uL — AB (ref 4.40–5.90)
RDW: 15.4 % — AB (ref 11.5–14.5)
WBC: 4.6 10*3/uL (ref 3.8–10.6)

## 2015-12-14 LAB — BASIC METABOLIC PANEL
Anion gap: 5 (ref 5–15)
BUN: 35 mg/dL — AB (ref 6–20)
CO2: 27 mmol/L (ref 22–32)
CREATININE: 1.62 mg/dL — AB (ref 0.61–1.24)
Calcium: 8.2 mg/dL — ABNORMAL LOW (ref 8.9–10.3)
Chloride: 105 mmol/L (ref 101–111)
GFR, EST AFRICAN AMERICAN: 49 mL/min — AB (ref >60)
GFR, EST NON AFRICAN AMERICAN: 42 mL/min — AB (ref >60)
Glucose, Bld: 121 mg/dL — ABNORMAL HIGH (ref 65–99)
POTASSIUM: 3.9 mmol/L (ref 3.5–5.1)
SODIUM: 137 mmol/L (ref 135–145)

## 2015-12-14 MED ORDER — FAMOTIDINE 20 MG PO TABS
20.0000 mg | ORAL_TABLET | Freq: Two times a day (BID) | ORAL | Status: DC
  Administered 2015-12-14: 20 mg via ORAL
  Filled 2015-12-14: qty 1

## 2015-12-14 MED ORDER — OXYCODONE HCL 5 MG PO TABS: 5.0000 mg | ORAL_TABLET | Freq: Four times a day (QID) | ORAL | 0 refills | Status: DC | PRN

## 2015-12-14 MED ORDER — ACETAMINOPHEN 325 MG PO TABS: 650.0000 mg | ORAL_TABLET | Freq: Four times a day (QID) | ORAL | 0 refills | Status: DC | PRN

## 2015-12-14 NOTE — Progress Notes (Signed)
Pt was alert and in good spirits. Pt hoping for discharge later today. Spoke of hobbies and his two wives. CH offered prayer.   12/14/15 1300  Clinical Encounter Type  Visited With Patient  Visit Type Follow-up;Spiritual support  Referral From Nurse  Spiritual Encounters  Spiritual Needs Prayer;Emotional  Stress Factors  Patient Stress Factors None identified

## 2015-12-14 NOTE — Discharge Summary (Signed)
Patient ID: Todd Spencer MRN: [id number] DOB/AGE: Aug 09, 1950 65 y.o.  Admit date: 12/12/2015 Discharge date: 12/14/2015   Discharge Diagnoses:  Active Problems:   Small bowel obstruction due to adhesions   Procedures:  None  Hospital Course:  Patient was admittd on 10/23 with partial small bowel obstruction.  NG tube was in place.  By 10/24, patient reported passing flatus and his NG tube was removed.  Clear liquid diet was started and changed to diabetic diet by 10/25.  He tolerated his diet well and had regained bowel function.  He was deemed ready for discharge to home.  Consults: None  Disposition: 01-Home or Self Care  Discharge Instructions    Call MD for:  difficulty breathing, headache or visual disturbances    Complete by:  As directed    Call MD for:  persistant nausea and vomiting    Complete by:  As directed    Call MD for:  severe uncontrolled pain    Complete by:  As directed    Call MD for:  temperature >100.4    Complete by:  As directed    Diet - low sodium heart healthy    Complete by:  As directed    Discharge instructions    Complete by:  As directed    Activity and diet as tolerated.   Driving Restrictions    Complete by:  As directed    Do not drive if taking narcotics for pain control.       Medication List    TAKE these medications   acetaminophen 325 MG tablet Commonly known as:  TYLENOL Take 2 tablets (650 mg total) by mouth every 6 (six) hours as needed for mild pain, fever or headache (headache).   atorvastatin 40 MG tablet Commonly known as:  LIPITOR Take 40 mg by mouth daily.   carvedilol 6.25 MG tablet Commonly known as:  COREG Take 1 tablet (6.25 mg total) by mouth 2 (two) times daily.   diazepam 5 MG tablet Commonly known as:  VALIUM Take 5 mg by mouth every 12 (twelve) hours as needed for anxiety.   donepezil 5 MG tablet Commonly known as:  ARICEPT Take 5 mg by mouth at bedtime.   doxepin 100 MG capsule Commonly  known as:  SINEQUAN Take 100-200 mg by mouth 2 (two) times daily. 100 mg in the morning and 200 mg at bedtime   gemfibrozil 600 MG tablet Commonly known as:  LOPID Take 600 mg by mouth 2 (two) times daily before a meal.   guaiFENesin 600 MG 12 hr tablet Commonly known as:  MUCINEX Take 600 mg by mouth 2 (two) times daily as needed for cough.   insulin aspart 100 UNIT/ML injection Commonly known as:  novoLOG Inject 15 Units into the skin 2 (two) times daily.   insulin glargine 100 UNIT/ML injection Commonly known as:  LANTUS Inject 90 Units into the skin at bedtime.   isosorbide dinitrate 30 MG tablet Commonly known as:  ISORDIL Take 30 mg by mouth 3 (three) times daily.   metFORMIN 500 MG 24 hr tablet Commonly known as:  GLUCOPHAGE-XR Take 1,500 mg by mouth daily with breakfast.   oxyCODONE 5 MG immediate release tablet Commonly known as:  Oxy IR/ROXICODONE Take 1 tablet (5 mg total) by mouth every 6 (six) hours as needed for moderate pain.   pregabalin 300 MG capsule Commonly known as:  LYRICA Take 300 mg by mouth 2 (two) times daily.   quinapril 5  MG tablet Commonly known as:  ACCUPRIL Take 5 mg by mouth daily.   ranitidine 150 MG tablet Commonly known as:  ZANTAC Take 150 mg by mouth 2 (two) times daily.      Follow-up Information    Olean Ree, MD .   Specialty:  Surgery Why:  You do not need a follow up appointment, but feel free to call the office if any questions or concerns. Contact information: [address]  [address] [place] [address] [phone]

## 2015-12-14 NOTE — Progress Notes (Signed)
[place] at [place] NAME: Todd Spencer    MR#:  [id number]  DATE OF BIRTH:  1950-03-24  SUBJECTIVE:  CHIEF COMPLAINT:   Chief Complaint  Patient presents with  . Abdominal Pain   - Feels better. NG tube removed yesterday. Had a bowel movement this morning. -Tolerating clear liquids well -Anticipating to go home today  REVIEW OF SYSTEMS:  Review of Systems  Constitutional: Negative for chills, fever and malaise/fatigue.  HENT: Negative for ear discharge, ear pain and nosebleeds.   Eyes: Negative for blurred vision and double vision.  Respiratory: Negative for cough, shortness of breath and wheezing.   Cardiovascular: Negative for chest pain, palpitations and leg swelling.  Gastrointestinal: Negative for abdominal pain, constipation, diarrhea, nausea and vomiting.  Genitourinary: Negative for dysuria.  Neurological: Negative for dizziness, sensory change, speech change, focal weakness, seizures and headaches.  Psychiatric/Behavioral: Negative for depression.    DRUG ALLERGIES:  No Known Allergies  VITALS:  Blood pressure (!) 143/77, pulse (!) 45, temperature 98 F (36.7 C), temperature source Oral, resp. rate 18, height 5\' 11"  (1.803 m), weight 100.8 kg (222 lb 3.2 oz), SpO2 98 %.  PHYSICAL EXAMINATION:  Physical Exam  GENERAL:  65 y.o.-year-old patient lying in the bed with no acute distress.  EYES: Pupils equal, round, reactive to light and accommodation. No scleral icterus. Extraocular muscles intact.  HEENT: Head atraumatic, normocephalic. Oropharynx and nasopharynx clear.  NECK:  Supple, no jugular venous distention. No thyroid enlargement, no tenderness.  LUNGS: Normal breath sounds bilaterally, no wheezing, rales,rhonchi or crepitation. No use of accessory muscles of respiration.  CARDIOVASCULAR: S1, S2 normal. No murmurs, rubs, or gallops.  ABDOMEN: soft, nontender, but distended. Normal bowel sounds present. No  organomegaly or mass.  EXTREMITIES: No pedal edema, cyanosis, or clubbing.  NEUROLOGIC: Cranial nerves II through XII are intact. Muscle strength 5/5 in all extremities. Sensation intact. Gait not checked.  PSYCHIATRIC: The patient is alert and oriented x 3.  SKIN: No obvious rash, lesion, or ulcer.    LABORATORY PANEL:   CBC  Recent Labs Lab 12/14/15 0504  WBC 4.6  HGB 10.3*  HCT 30.2*  PLT 93*   ------------------------------------------------------------------------------------------------------------------  Chemistries   Recent Labs Lab 12/11/15 2148  12/14/15 0504  NA 137  < > 137  K 4.8  < > 3.9  CL 101  < > 105  CO2 29  < > 27  GLUCOSE 168*  < > 121*  BUN 27*  < > 35*  CREATININE 1.47*  < > 1.62*  CALCIUM 9.2  < > 8.2*  AST 20  --   --   ALT 17  --   --   ALKPHOS 58  --   --   BILITOT 1.0  --   --   < > = values in this interval not displayed. ------------------------------------------------------------------------------------------------------------------  Cardiac Enzymes No results for input(s): TROPONINI in the last 168 hours. ------------------------------------------------------------------------------------------------------------------  RADIOLOGY:  No results found.  EKG:   Orders placed or performed during the hospital encounter of 12/12/15  . EKG 12-Lead  . EKG 12-Lead    ASSESSMENT AND PLAN:   Todd Spencer  is a 65 y.o. male with a known history of CAD s/p stents, CHF with EF 35%, CKD stage 3, HTN, IDDM, Neuropathy, OSA presents to the hospital secondary to worsening abdominal pain, bloating, nausea. Noted to have small bowel obstruction on CT abdomen. Medical consult requested for medical management.   #  1 SBO- likely partial, from previous abdominal surgeries and adhesions - Improved with conservative mgmt, NG tube has been removed. Bowels are moving. Had a BM this morning -On clear liquid diet. Likely will be advanced to solids  today - mgmt per surgical team  #2 DM- IDDM, hba1c of 9.5,  sugars are better for now - Lantus may be restarted once diet is advanced  #3 HTN- home meds are restarted - coreg, lisinopril and isordil Monitor  #4 CHF- chronic systolic, well compensated Not on lasix at home, monitor  #5 CAD s/p stents- stable, restarted cardiac meds  #6 DVT prophylaxis- lovenox Monitor as has low platelet count.  #7 CKD stage 3- known CKD, baseline cr 1.8 Follows with [facility] nephrology Monitor for now  Medically stable. Anticipate discharge today. Will sign off. Discussed with surgeon   All the records are reviewed and case discussed with Care Management/Social Workerr. Management plans discussed with the patient, family and they are in agreement.  CODE STATUS: full code  TOTAL TIME TAKING CARE OF THIS PATIENT: 37 minutes.   POSSIBLE D/C TODAY, DEPENDING ON CLINICAL CONDITION.   Rylee Huestis M.D on 12/14/2015 at 1:08 PM  Between 7am to 6pm - Pager - [phone]  After 6pm go to [email or web address] - password [email or web address] [facility]  Office  [phone]  CC: Primary care physician; Marguerita Merles, MD

## 2015-12-14 NOTE — Progress Notes (Signed)
12/14/2015  Subjective: No acute events overnight. His NG tube was removed yesterday early afternoon and was started on clears. He tolerated them well and had a bowel movement overnight. Denies any nausea, vomiting.  Vital signs: Temp:  [98 F (36.7 C)-98.5 F (36.9 C)] 98 F (36.7 C) (10/25 0507) Pulse Rate:  [45-84] 45 (10/25 1100) Resp:  [18-20] 18 (10/25 0507) BP: (104-143)/(56-77) 143/77 (10/25 1100) SpO2:  [98 %-100 %] 98 % (10/25 0507)   Intake/Output: 10/24 0701 - 10/25 0700 In: 1722.4 [P.O.:40; I.V.:1632.4; IV Piggyback:50] Out: 550 [Urine:550] Last BM Date: 12/12/15  Physical Exam: Constitutional: No acute distress Abdomen: Soft, nondistended, nontender to palpation.  Labs:   Recent Labs  12/13/15 0513 12/14/15 0504  WBC 4.3 4.6  HGB 11.6* 10.3*  HCT 33.2* 30.2*  PLT 81* 93*    Recent Labs  12/13/15 0513 12/14/15 0504  NA 137 137  K 4.2 3.9  CL 105 105  CO2 26 27  GLUCOSE 118* 121*  BUN 35* 35*  CREATININE 1.68* 1.62*  CALCIUM 8.1* 8.2*   No results for input(s): LABPROT, INR in the last 72 hours.  Imaging: No results found.  Assessment/Plan: 65 year old male minute with a partial small bowel obstruction which is now resolved.  -We'll advance the patient's diet to a diabetic diet today. His IV fluids will be discontinued. If he tolerates his diet today he will likely be discharged to home this afternoon.   Melvyn Neth, [place]

## 2015-12-14 NOTE — Progress Notes (Signed)
IV was removed. Discharge instructions, follow-up appointments, and prescriptions were provided to the pt. All questions answered.

## 2016-01-03 ENCOUNTER — Other Ambulatory Visit: Payer: Self-pay

## 2016-01-03 NOTE — Patient Outreach (Signed)
[facility]) Care Management  01/03/2016  Todd Spencer 1950-09-05 [id number]   Telephone Screen  Referral Date: 01/02/16 Referral Source:[facility] Referral Reason: "recently discharged from hospital for SBO, has worked with [facility] TOC program, could benefit from ongoing DM and CHF education"    Outreach attempt # 1  to patient. Spoke with patient and screening completed.   Social: Patient resides in his home alone. He has a mother that lives nearby. He voices that he is independent with ADLs/IADLs. He drives himself to medical appts. He denies any recent falls. DME in the home include CPAP and cbg meter.   Conditions: Patient has PMH of CAD s/p stents, CHF -EF 355, CKD stage 3, DM, neuropathy, OSA(uses CPAP QHS), depression, HOH, HLD, MI. He states he has a "bad left knee" and needs knee replacement surgery. Patient reports he is taking his blood sugars 3-6x/day depending on how he is feeling. He states fasting cbg this am was 150. Per records last A1C was 9.5(Oct. 2017).  Patient reports some issues with ankle edema at times. He states that he does not have a scale in the home but goes to his cousin's house several times a week in order to weigh himself. Patient was hospitalized from 12/12/15 to 12/14/15 for SBO. He reports he that he still does not feel like his stomach is "quite right." Denies any N&V. Appetite is good. He reports he is having a BM at least q3 days with laxative support.    Medications: Per patient he is taking 14 meds. He denies any issues with managing and/or affording meds.  Appointments: he reports that he has new PCP(Lindsey Cornetto, NP) at the same [facility]. He has appt with PCP on 11/21. He also sees cardiologist and pulmonologist and endocrinologist whom he reports "all work in the [place] office." he is followed by [facility].    Advance Directives: None. Patient declined further info at this  time.   Consent: [facility] services reviewed and discussed. Patient gave verbal consent for [facility] services.    Plan: RN CM will notify [facility] administrative assistant of case status. RN CM will send [facility] community referral for TOC.   Enzo Montgomery, RN,BSN,CCM [place] Management Telephonic Care Management Coordinator Direct Phone: [phone] Toll Free: [phone] Fax: [phone]'

## 2016-01-05 ENCOUNTER — Encounter: Payer: Self-pay | Admitting: *Deleted

## 2016-01-05 ENCOUNTER — Other Ambulatory Visit: Payer: Self-pay | Admitting: *Deleted

## 2016-01-05 NOTE — Patient Outreach (Addendum)
Transition of care call successful, follow up on referral from [facility] telephonic RN CM,[facility] referral- recent hospitalization 10/23-10/25 small bowel obstruction due to adhesions.  Spoke with pt, HIPAA verified, discussed  purpose of call, following up on referral.    Pt reports doing good, yesterday was bad, digestion out of order all day long, stomach making noises/small amount of stool.   Pt reports today is good, bowels okay, cutting down on eating a lot of food.  Pt reports sugar today was 150 which is good, concerned if goes  too low.   Pt reports no MD visits since  Discharge, scheduled to see [name] NP 11/21.   Pt reports taking all of his medications.     RN CM discussed doing a home visit to which pt agreed, needs to be in the morning as he goes hunting in the afternoon (his exercise).     Plan:  As discussed, plan to follow up with pt tomorrow- initial home visit.    Zara Chess. [place] Care Management  [phone]  Addendum:  Barrier letter sent to Dr. Lennox Grumbles, informing of [facility] involvement.    Zara Chess.   [place] Care Management  [phone]

## 2016-01-06 ENCOUNTER — Other Ambulatory Visit: Payer: Self-pay | Admitting: *Deleted

## 2016-01-06 ENCOUNTER — Encounter: Payer: Self-pay | Admitting: *Deleted

## 2016-01-06 NOTE — Patient Outreach (Signed)
11:11 am-  Received a return phone call from [place]( to [name] NP) to message left earlier.   This RN CM discussed with Raquel Sarna did a home visit with pt today, HR 45,pt asymptomatic, pt reported it runs low.  Raquel Sarna confirmed pt has an appointment with Mendel Ryder NP 11/21, to put in pt's chart reported low HR.     Plan:  RN CM to continue to follow pt for transition of care, coworker Richarda Osmond RN CM covering for this RN CM to do transition of care call next week.      Zara Chess.   [place] Care Management  [phone]

## 2016-01-06 NOTE — Patient Outreach (Signed)
11:04 am:  This RN  CM called Lisabeth Register NP office, spoke with [facility] requesting to speak with  Billey Chang NP nurse about pt's low HR today (home visit).   Hernetta to leave message with Mendel Ryder NP's nurse.    Zara Chess.   [place] Care Management  [phone]

## 2016-01-06 NOTE — Patient Outreach (Signed)
[facility]) Care Management   01/06/2016  PARKER DELACERDA 1950-08-05 [id number]  Todd Spencer is an 65 y.o. male  Subjective: Pt reports no problems with bowels today, cut his meal portions  Down 50%.  Pt reports he takes MOM (small amount) every other day.  Pt reports Does not have a scale, weighs at his cousin, last weight 224 lbs.  Pt reports sugar this am was 125, was told last A1C was high, to see diabetic MD 11/28.   Pt reports Isosorbide gives him a headache, used to it, takes medication most of the time as ordered 3 times a day, when he goes out does not take the third dose.      Objective:   Vitals:   01/06/16 1005  BP: 108/70  Pulse: (!) 45  Resp: 20    ROS  Physical Exam  Constitutional: He appears well-developed and well-nourished.  Cardiovascular: Normal heart sounds.   Low HR, no observed or reported dizziness.    Respiratory: Effort normal and breath sounds normal.  GI: Soft. Bowel sounds are normal.  Musculoskeletal: Normal range of motion. He exhibits no edema.  Neurological: He is alert.  Skin: Skin is warm and dry.  Psychiatric: He has a normal mood and affect. His behavior is normal. Judgment and thought content normal.    Encounter Medications:  Reviewed with pt  Outpatient Encounter Prescriptions as of 01/06/2016  Medication Sig Note  . acetaminophen (TYLENOL) 325 MG tablet Take 2 tablets (650 mg total) by mouth every 6 (six) hours as needed for mild pain, fever or headache (headache). 01/06/2016: As needed.   Marland Kitchen atorvastatin (LIPITOR) 40 MG tablet Take 40 mg by mouth daily.   . carvedilol (COREG) 6.25 MG tablet Take 1 tablet (6.25 mg total) by mouth 2 (two) times daily.   . diazepam (VALIUM) 5 MG tablet Take 5 mg by mouth every 12 (twelve) hours as needed for anxiety.  01/06/2016: As needed.   . donepezil (ARICEPT) 5 MG tablet Take 5 mg by mouth at bedtime.   Marland Kitchen doxepin (SINEQUAN) 100 MG capsule Take 100-200 mg by mouth 2 (two)  times daily. 100 mg in the morning and 200 mg at bedtime   . gemfibrozil (LOPID) 600 MG tablet Take 600 mg by mouth 2 (two) times daily before a meal.   . guaiFENesin (MUCINEX) 600 MG 12 hr tablet Take 600 mg by mouth 2 (two) times daily as needed for cough. 01/06/2016: Taking once a day   . insulin aspart (NOVOLOG) 100 UNIT/ML injection Inject 15 Units into the skin 2 (two) times daily.   . insulin glargine (LANTUS) 100 UNIT/ML injection Inject 90 Units into the skin at bedtime.   . isosorbide dinitrate (ISORDIL) 30 MG tablet Take 30 mg by mouth 3 (three) times daily.  01/06/2016: Pt takes most times three times a day, if going somewhere will take the third tablet due to s/e of headache.   . meclizine (ANTIVERT) 25 MG tablet Take 25 mg by mouth 3 (three) times daily as needed. 01/06/2016: Taking as needed.   . metFORMIN (GLUCOPHAGE-XR) 500 MG 24 hr tablet Take 1,500 mg by mouth daily with breakfast. 01/06/2016: Pt takes at supper time.   . pregabalin (LYRICA) 300 MG capsule Take 300 mg by mouth 2 (two) times daily.   . quinapril (ACCUPRIL) 5 MG tablet Take 5 mg by mouth daily.    . ranitidine (ZANTAC) 150 MG tablet Take 150 mg by mouth 2 (two)  times daily.   . nitroGLYCERIN (NITROSTAT) 0.4 MG SL tablet Place 0.4 mg under the tongue every 5 (five) minutes as needed. 01/06/2016: Pt has available if needed.   Marland Kitchen oxyCODONE (OXY IR/ROXICODONE) 5 MG immediate release tablet Take 1 tablet (5 mg total) by mouth every 6 (six) hours as needed for moderate pain. (Patient not taking: Reported on 01/06/2016) 01/06/2016: Completed.     No facility-administered encounter medications on file as of 01/06/2016.     Functional Status:   In your present state of health, do you have any difficulty performing the following activities: 01/06/2016 12/12/2015  Hearing? Y N  Vision? N N  Difficulty concentrating or making decisions? N N  Walking or climbing stairs? N N  Dressing or bathing? N N  Doing errands,  shopping? N N  Preparing Food and eating ? N -  Using the Toilet? N -  In the past six months, have you accidently leaked urine? N -  Do you have problems with loss of bowel control? N -  Managing your Medications? N -  Managing your Finances? N -  Housekeeping or managing your Housekeeping? N -  Some recent data might be hidden    Fall/Depression Screening:    PHQ 2/9 Scores 01/03/2016  PHQ - 2 Score 0    Assessment:  Pleasant 65 year old gentleman, lives alone.   Lungs clear, no complaints of sob, pain.  Heart rate today  45 to which pt reports runs low.  Pt asymptomatic- no observed or reported               Dizziness.                 DM- today 125.  View of pt's glucometer averages 7 day- 190, 14 day-191, 30 day- 171.    Plan:   As discussed with pt, plan to continue to follow for transition of care.  Informed pt coworker             Journalist, newspaper CM  covering for this RN CM will be doing transition of care call next week.               Plan to call Dr. Lennox Grumbles office today, report pt's low heart rate, pt asymtomatic.               Plan to send Dr. Lennox Grumbles home visit encounter.     [facility] CM Care Plan Problem One   Flowsheet Row Most Recent Value  Care Plan Problem One  Risk for readmission related to recent hospitalization for SBO   Role Documenting the Problem One  Care Management Coordinator  Care Plan for Problem One  Active  THN Long Term Goal (31-90 days)  Pt would not readmit to the hospital within the next 31 days   THN Long Term Goal Start Date  01/05/16  Interventions for Problem One Long Term Goal  home visit done, Emmi information on Intestional or Bowel obstruction- discharge, provided/reviewed with pt  THN CM Short Term Goal #1 (0-30 days)  Pt would have no issues with bowels in the next 30 days   THN CM Short Term Goal #1 Start Date  01/05/16  Interventions for Short Term Goal #1  Discussed with pt to continue to cut down on eating alot   THN CM Short Term Goal #2 (0-30  days)  Pt would keep MD appointment within the next 10 days   THN CM Short Term Goal #  2 Start Date  01/05/16  Interventions for Short Term Goal #2  Discussed with pt follow up appointment post discharge, pt to see NP 11/21.     [facility] CM Care Plan Problem Two   Flowsheet Row Most Recent Value  Care Plan Problem Two  Diabetes- recent A1C 9.5   Role Documenting the Problem Two  Care Management [place] for Problem Two  Active  THN CM Short Term Goal #1 (0-30 days)  Pt sugars would be within norm in the next 30 days   THN CM Short Term Goal #1 Start Date  01/05/16  Interventions for Short Term Goal #2   Discussed with pt recent sugar- today 150, per pt cutting down on eating alot.       Zara Chess.   [place] Care Management  [phone]

## 2016-01-10 ENCOUNTER — Other Ambulatory Visit: Payer: Self-pay | Admitting: *Deleted

## 2016-01-10 ENCOUNTER — Telehealth: Payer: Self-pay | Admitting: Internal Medicine

## 2016-01-10 ENCOUNTER — Encounter: Payer: Self-pay | Admitting: Internal Medicine

## 2016-01-10 DIAGNOSIS — I255 Ischemic cardiomyopathy: Secondary | ICD-10-CM

## 2016-01-10 NOTE — Telephone Encounter (Signed)
Nurse with Dr. Saralyn Pilar called states pt was in their office today with bradycardia.  Dr. Saralyn Pilar  States pt needs ICD and pacemaker. I have scanned in echocardiogram from Dr. Saralyn Pilar performed in May 2017. Please advise of next step for patient.

## 2016-01-10 NOTE — Telephone Encounter (Signed)
I called and spoke with the staff at Dr. Josefa Half office. They report the patient was following up with his PCP today for recent hospitalization for bowel obstruction. He was sent to Dr. Josefa Half from his PCP for a HR of 45 bpm today. I have been advised that the patient was asymptomatic today with his HR's. A copy of his echo report was sent over from Dr. Josefa Half from May 2017 that showed his EF to be 35 %. I have reviewed this with Dr. Caryl Comes- he states that patient will need a repeat echo and then to follow up with [place] in the office.

## 2016-01-10 NOTE — Patient Outreach (Signed)
Late entry-  This RN CM made request to [facility] care management assistant- update pt's Primary Care MD, pt now seeing [name] NP.       Zara Chess.   [place] Care Management  [phone]

## 2016-01-11 NOTE — Telephone Encounter (Signed)
Reviewed with Dr. Caryl Comes- the patient will need a repeat echo as the one done with Dr. Josefa Half was 6 months ago and the patient's EF at that time was 35%.  We will follow up with him in the office next week if the echo can be prior to. Message sent to [facility] to arrange follow up.

## 2016-01-13 ENCOUNTER — Other Ambulatory Visit: Payer: Self-pay | Admitting: *Deleted

## 2016-01-13 ENCOUNTER — Encounter: Payer: Self-pay | Admitting: *Deleted

## 2016-01-13 NOTE — Patient Outreach (Signed)
[facility]) Care Management [place] Telephone Outreach, Transition of Care day 9 01/13/2016  Todd Spencer [birth year] [id number]  Successful telephone outreach to Todd Spencer, 65 y/o male referred to [place] after recent hospital visit October 23-25, 2017 for small bowel obstruction secondary to adhesions.  HIPAA/ identity verified.  Today, Todd Spencer reports that he is "doing great."  -- attended recent PCP/ NP visit; was referred to cardiology same day and Holter cardiac monitor was ordered for 24 hours.  Pt. States he wore the monitor, and returned monitor to nurse on Wednesday 01/11/16.  Reports waiting on results, confirms that he has follow up cardiology appointment next week, Thursday 01/09/16; patient states that he is "not sure" if he will attend this appointment, stating, "they may not have the results from my heart monitor back by then."  Strongly encouraged patient to keep cardiology appointment next week, and explained that the cardiologist would be able to facilitate obtaining results of holter monitor if they were not back by time of appointment.  Eventually, patient agreed to attend upcoming cardiology appointment.  -- "No problems" with bowels, "they are normal."  Denies pain, problems around recent SBO.  -- has continued monitoring blood sugars, states blood sugar was "90" this morning (fasting), and "was good last night after eating a very big Thanksgiving meal."  Reports checks blood sugars "5 or 6 times a day."  Confirms upcoming appointment "next Tuesday" with endocrinologist for DM management, verbalizes plans to attend this appointment.  Patient encouraged to continue checking blood sugars.  -- reports that he has all medications and is taking as prescribed.  Denies questions/ issues around medications.   Patient denies further questions, concerns, issues, or problems today.  Explained that I would update his primary [place] RN  CM on his progress thus far post-hospital discharge, and that she would follow up with him next week.  Encouraged patient to continue attending provider appointments, taking medications as prescribed, checking blood sugars, and to contact his providers promptly for any new concerns or problems, to which patient verbalized agreement.  Plan:  Will make patient's primary [facility] RN CM Kathie Rhodes) aware of successful patient outreach/ update.   Oneta Rack, RN, BSN, [employer] [facility] Care Management  [phone]

## 2016-01-19 ENCOUNTER — Encounter: Payer: Self-pay | Admitting: Internal Medicine

## 2016-01-19 ENCOUNTER — Ambulatory Visit (INDEPENDENT_AMBULATORY_CARE_PROVIDER_SITE_OTHER): Payer: PPO | Admitting: Internal Medicine

## 2016-01-19 ENCOUNTER — Ambulatory Visit (INDEPENDENT_AMBULATORY_CARE_PROVIDER_SITE_OTHER): Payer: PPO

## 2016-01-19 ENCOUNTER — Other Ambulatory Visit: Payer: Self-pay

## 2016-01-19 VITALS — BP 110/70 | HR 48 | Ht 71.0 in | Wt 221.5 lb

## 2016-01-19 DIAGNOSIS — I5022 Chronic systolic (congestive) heart failure: Secondary | ICD-10-CM

## 2016-01-19 DIAGNOSIS — I255 Ischemic cardiomyopathy: Secondary | ICD-10-CM

## 2016-01-19 NOTE — Progress Notes (Signed)
ELECTROPHYSIOLOGY PROGRESS  NOTE  Patient ID: CORRY IHNEN, MRN: [id number], DOB/AGE: Jul 01, 1950 65 y.o. Admit date: (Not on file) Date of Consult: 01/19/2016  Primary Physician: Minette Headland, NP Primary Cardiologist:  [facility]  Chief Complaint: ICD    HPI Todd Spencer is a 65 y.o. male  Seen in follow-up for consultation for an ICD.    After his initial consultation about 16 months ago I spoke with his primary cardiologist with the anticipation of him undertaking cardiac catheterization.  He has coronary artery disease, ischemic cardiomyopathy, chronic systolic congestive heart failure, hypertension, hyperlipidemia and type 2 diabetes and is status post MI and coronary stent 1996, stent distal RCA 07/1998, PTCA distal RCA 10/14/2009 wiith  diffuse InStent restenoses distal RCA 08/10/2011 treated medically.  Repeat catheterization 9/16 demonstrated occlusion of the RCA. It was elected not to open it. He is described at that point is having "severe dilated cardiomyopathy"Catheterization report 9/16 reviewed no LV gram  He was referred back to [facility] for LV funcdtion assessment which was never accomplished  He is here today for   echo and reassessment >> preliminary 45-50&   He was switched at last visit to carvedilol. Not withstanding his heart rate in the 40s, he is extremely active. He recounted his here hunting for many.  He denies chest pain edema.  .      Past Medical History:  Diagnosis Date  . Anginal pain ([place])   . CAD (coronary artery disease)    s/p stents  . Cardiomyopathy ([place])    ischemic cardiomyopathy, EF 35%  . CKD (chronic kidney disease) stage 3, GFR 30-59 ml/min    baseline cr of 1.8  . Coronary artery disease   . Depression   . Diabetes mellitus without complication ([place])   . History of hiatal hernia   . HOH (hard of hearing)   . Hyperlipidemia   . Hypertension   . Myocardial infarction   . Neuropathy ([place])   . Sleep apnea    CPAP  . Wheezing        Surgical History:  Past Surgical History:  Procedure Laterality Date  . CARDIAC CATHETERIZATION    . CARDIAC CATHETERIZATION Left 11/04/2014   Procedure: Left Heart Cath and Coronary Angiography;  Surgeon: Isaias Cowman, MD;  Location: [facility];  Service: Cardiovascular;  Laterality: Left;  . CATARACT EXTRACTION W/PHACO Right 07/12/2014   Procedure: CATARACT EXTRACTION PHACO AND INTRAOCULAR LENS PLACEMENT (IOC);  Surgeon: Estill Cotta, MD;  Location: [facility] ORS;  Service: Ophthalmology;  Laterality: Right;  [place] 01:09 AP% 22.8 CDE 26.03  . CATARACT EXTRACTION W/PHACO Left 10/18/2014   Procedure: CATARACT EXTRACTION PHACO AND INTRAOCULAR LENS PLACEMENT (IOC);  Surgeon: Estill Cotta, MD;  Location: [facility] ORS;  Service: Ophthalmology;  Laterality: Left;  [place]: 01L09.4 AP%: 24.2 CDE: 28.45 Lot # [id number]  . CHOLECYSTECTOMY    . CORONARY ANGIOPLASTY    . EYE SURGERY    . gsw     abd  . HERNIA REPAIR       Home Meds: Prior to Admission medications   Medication Sig Start Date End Date Taking? Authorizing Provider  aspirin 325 MG EC tablet Take 325 mg by mouth daily.    Historical Provider, MD  atorvastatin (LIPITOR) 40 MG tablet Take 40 mg by mouth daily.    Historical Provider, MD  clopidogrel (PLAVIX) 75 MG tablet Take 75 mg by mouth daily.    Historical Provider, MD  doxepin (SINEQUAN) 100 MG  capsule Take 100 mg by mouth 2 (two) times daily.    Historical Provider, MD  glipiZIDE (GLUCOTROL XL) 10 MG 24 hr tablet Take 10 mg by mouth 2 (two) times daily at 10 AM and 5 PM.    Historical Provider, MD  insulin glargine (LANTUS) 100 UNIT/ML injection Inject 90 Units into the skin at bedtime.    Historical Provider, MD  isosorbide dinitrate (ISORDIL) 30 MG tablet Take 90 mg by mouth daily.    Historical Provider, MD  linagliptin (TRADJENTA) 5 MG TABS tablet Take 5 mg by mouth daily.    Historical Provider, MD  meclizine (ANTIVERT) 25 MG tablet Take 50 mg by mouth 2  (two) times daily.    Historical Provider, MD  metFORMIN (GLUMETZA) 1000 MG (MOD) 24 hr tablet Take 1,000 mg by mouth 2 (two) times daily with a meal.    Historical Provider, MD  metoprolol (LOPRESSOR) 50 MG tablet Take 50 mg by mouth daily.    Historical Provider, MD  omega-3 acid ethyl esters (LOVAZA) 1 G capsule Take 1 g by mouth daily.    Historical Provider, MD  pregabalin (LYRICA) 300 MG capsule Take 300 mg by mouth 2 (two) times daily.    Historical Provider, MD  quinapril (ACCUPRIL) 10 MG tablet Take 10 mg by mouth daily.    Historical Provider, MD  ranitidine (ZANTAC) 150 MG capsule Take 150 mg by mouth 2 (two) times daily.    Historical Provider, MD     Allergies: No Known Allergies  Social History   Social History  . Marital status: Divorced    Spouse name: N/A  . Number of children: N/A  . Years of education: N/A   Occupational History  . Not on file.   Social History Main Topics  . Smoking status: Former Smoker    Quit date: 07/19/1993  . Smokeless tobacco: Never Used  . Alcohol use No  . Drug use: No  . Sexual activity: Not on file   Other Topics Concern  . Not on file   Social History Narrative   Lives at home, independent and active at baseline     Family History  Problem Relation Age of Onset  . Multiple myeloma Father   . Heart failure Maternal Aunt   . Heart failure Maternal Uncle   . Heart failure Maternal Uncle   . Heart failure Maternal Aunt      ROS:  Please see the history of present illness.     All other systems reviewed and negative.    Physical Exam:  BP R arm  329 systolic Blood pressure 924/26, pulse (!) 48, height '5\' 11"'$  (1.803 m), weight 221 lb 8 oz (100.5 kg). Well developed and nourished in no acute distress HENT normal Neck supple with JVP-flat Clear Regular rate and rhythm, no murmurs or gallops Abd-soft with active BS No Clubbing cyanosis edema Skin-warm and dry A & Oriented  Grossly normal sensory and motor function       Labs: Cardiac Enzymes No results for input(s): CKTOTAL, CKMB, TROPONINI in the last 72 hours. CBC Lab Results  Component Value Date   WBC 4.6 12/14/2015   HGB 10.3 (L) 12/14/2015   HCT 30.2 (L) 12/14/2015   MCV 81.7 12/14/2015   PLT 93 (L) 12/14/2015   PROTIME: No results for input(s): LABPROT, INR in the last 72 hours. Chemistry No results for input(s): NA, K, CL, CO2, BUN, CREATININE, CALCIUM, PROT, BILITOT, ALKPHOS, ALT, AST, GLUCOSE in the last  168 hours.  Invalid input(s): LABALBU Lipids Lab Results  Component Value Date   CHOL 114 08/10/2011   HDL 22 (L) 08/10/2011   LDLCALC 32 08/10/2011   TRIG 300 (H) 08/10/2011    EKG: Sinus rhythm at 55 Intervals 15/12/41 IMI  Assessment and Plan:   Ischemic Cardiomyopathy with occluded RCA stent  EF 30-40% 1 year ago>>45-50%   Dementia- mild   Sinus bradycardia  CHF chronic systolic    Euvolemic continue current meds  Without symptoms of ischemia  LV ejection fraction is now in the 45-50% range. He is no longer a range where we should consider ICD implantation. We'll be glad to see him and Dr. Suzan Garibaldi request in the future        Virl Axe

## 2016-01-19 NOTE — Patient Instructions (Signed)
Medication Instructions: - Your physician recommends that you continue on your current medications as directed. Please refer to the Current Medication list given to you today.  Labwork: - none ordered  Procedures/Testing: - none ordered  Follow-Up: - Dr. Klein will see you back on an as needed basis.  Any Additional Special Instructions Will Be Listed Below (If Applicable).     If you need a refill on your cardiac medications before your next appointment, please call your pharmacy.   

## 2016-01-20 ENCOUNTER — Other Ambulatory Visit: Payer: Self-pay | Admitting: *Deleted

## 2016-01-20 ENCOUNTER — Ambulatory Visit: Payer: Self-pay | Admitting: *Deleted

## 2016-01-20 NOTE — Addendum Note (Signed)
Addended by: Britt Bottom on: 01/20/2016 08:01 AM   Modules accepted: Orders

## 2016-01-20 NOTE — Patient Outreach (Signed)
Transition of care call successful, ongoing follow up on recent hospitalization 10/23-10/25 for small bowel obstruction secondary to adhesions.  Spoke with pt, HIPAA verified.  Pt reports saw heart MD yesterday, had an Echo, told no need for defibrillator, told  in tip top shape, no medication changes.  Pt reports bowels are good, moving every day.  Pt reports sugar today was 125, 100 is average.   RN CM discussed with pt upcoming appointment with Dr. Saralyn Pilar 12/6 to which pt reports is 12/12, will get up with MD.   Plan:  As discussed with pt, plan to follow up again next week telephonically- part of ongoing transition of care.   Zara Chess.   [place] Care Management  [phone]

## 2016-01-21 ENCOUNTER — Observation Stay
Admission: EM | Admit: 2016-01-21 | Discharge: 2016-01-22 | Disposition: A | Discharge status: Home / Self Care | Payer: PPO | Attending: Internal Medicine | Admitting: Internal Medicine

## 2016-01-21 DIAGNOSIS — G4733 Obstructive sleep apnea (adult) (pediatric): Secondary | ICD-10-CM | POA: Diagnosis not present

## 2016-01-21 DIAGNOSIS — E1122 Type 2 diabetes mellitus with diabetic chronic kidney disease: Secondary | ICD-10-CM | POA: Diagnosis not present

## 2016-01-21 DIAGNOSIS — Z87891 Personal history of nicotine dependence: Secondary | ICD-10-CM | POA: Insufficient documentation

## 2016-01-21 DIAGNOSIS — I130 Hypertensive heart and chronic kidney disease with heart failure and stage 1 through stage 4 chronic kidney disease, or unspecified chronic kidney disease: Secondary | ICD-10-CM | POA: Insufficient documentation

## 2016-01-21 DIAGNOSIS — F0280 Dementia in other diseases classified elsewhere without behavioral disturbance: Secondary | ICD-10-CM | POA: Insufficient documentation

## 2016-01-21 DIAGNOSIS — I255 Ischemic cardiomyopathy: Secondary | ICD-10-CM | POA: Insufficient documentation

## 2016-01-21 DIAGNOSIS — I2510 Atherosclerotic heart disease of native coronary artery without angina pectoris: Secondary | ICD-10-CM | POA: Diagnosis not present

## 2016-01-21 DIAGNOSIS — E1140 Type 2 diabetes mellitus with diabetic neuropathy, unspecified: Secondary | ICD-10-CM | POA: Diagnosis not present

## 2016-01-21 DIAGNOSIS — G309 Alzheimer's disease, unspecified: Secondary | ICD-10-CM | POA: Diagnosis not present

## 2016-01-21 DIAGNOSIS — F0150 Vascular dementia without behavioral disturbance: Secondary | ICD-10-CM | POA: Insufficient documentation

## 2016-01-21 DIAGNOSIS — T383X1A Poisoning by insulin and oral hypoglycemic [antidiabetic] drugs, accidental (unintentional), initial encounter: Secondary | ICD-10-CM | POA: Diagnosis not present

## 2016-01-21 DIAGNOSIS — Z955 Presence of coronary angioplasty implant and graft: Secondary | ICD-10-CM | POA: Insufficient documentation

## 2016-01-21 DIAGNOSIS — F329 Major depressive disorder, single episode, unspecified: Secondary | ICD-10-CM | POA: Diagnosis not present

## 2016-01-21 DIAGNOSIS — Z794 Long term (current) use of insulin: Secondary | ICD-10-CM | POA: Diagnosis not present

## 2016-01-21 DIAGNOSIS — I5022 Chronic systolic (congestive) heart failure: Secondary | ICD-10-CM | POA: Insufficient documentation

## 2016-01-21 DIAGNOSIS — E785 Hyperlipidemia, unspecified: Secondary | ICD-10-CM | POA: Diagnosis not present

## 2016-01-21 DIAGNOSIS — E1165 Type 2 diabetes mellitus with hyperglycemia: Secondary | ICD-10-CM | POA: Diagnosis not present

## 2016-01-21 DIAGNOSIS — I252 Old myocardial infarction: Secondary | ICD-10-CM | POA: Diagnosis not present

## 2016-01-21 LAB — GLUCOSE, CAPILLARY
GLUCOSE-CAPILLARY: 104 mg/dL — AB (ref 65–99)
Glucose-Capillary: 100 mg/dL — ABNORMAL HIGH (ref 65–99)

## 2016-01-21 NOTE — ED Triage Notes (Signed)
Pt says about 90 minutes ago he was taking his night time medications; he is supposed to take 90 units Lantus insulin in the evening, as well as other medications, and accidentally took 90 units Novolin R; blood sugar before insulin was 225; pt says currently he feels weak and sleepy;

## 2016-01-21 NOTE — ED Notes (Signed)
Patient sitting in bed eating [place] sandwich (no bread) and potato chips. Explained to patient we need his blood sugars to come up. Patient states he feels sleepy. Call bell in reach, door left open. Will continue to monitor.

## 2016-01-21 NOTE — ED Notes (Signed)
Verbal report to Amy, RN

## 2016-01-22 ENCOUNTER — Encounter: Payer: Self-pay | Admitting: *Deleted

## 2016-01-22 DIAGNOSIS — T383X1A Poisoning by insulin and oral hypoglycemic [antidiabetic] drugs, accidental (unintentional), initial encounter: Secondary | ICD-10-CM | POA: Diagnosis present

## 2016-01-22 LAB — GLUCOSE, CAPILLARY
GLUCOSE-CAPILLARY: 83 mg/dL (ref 65–99)
Glucose-Capillary: 117 mg/dL — ABNORMAL HIGH (ref 65–99)
Glucose-Capillary: 128 mg/dL — ABNORMAL HIGH (ref 65–99)
Glucose-Capillary: 168 mg/dL — ABNORMAL HIGH (ref 65–99)
Glucose-Capillary: 172 mg/dL — ABNORMAL HIGH (ref 65–99)
Glucose-Capillary: 76 mg/dL (ref 65–99)

## 2016-01-22 LAB — BASIC METABOLIC PANEL
Anion gap: 6 (ref 5–15)
BUN: 23 mg/dL — ABNORMAL HIGH (ref 6–20)
CALCIUM: 9 mg/dL (ref 8.9–10.3)
CO2: 25 mmol/L (ref 22–32)
CREATININE: 1.12 mg/dL (ref 0.61–1.24)
Chloride: 108 mmol/L (ref 101–111)
GFR calc non Af Amer: >60 mL/min (ref >60)
Glucose, Bld: 85 mg/dL (ref 65–99)
Potassium: 3.6 mmol/L (ref 3.5–5.1)
SODIUM: 139 mmol/L (ref 135–145)

## 2016-01-22 LAB — CBC WITH DIFFERENTIAL/PLATELET
BASOS PCT: 1 %
Basophils Absolute: 0 10*3/uL (ref 0–0.1)
EOS ABS: 0.2 10*3/uL (ref 0–0.7)
Eosinophils Relative: 3 %
HCT: 33.9 % — ABNORMAL LOW (ref 40.0–52.0)
Hemoglobin: 11.6 g/dL — ABNORMAL LOW (ref 13.0–18.0)
Lymphocytes Relative: 21 %
Lymphs Abs: 1.3 10*3/uL (ref 1.0–3.6)
MCH: 28.1 pg (ref 26.0–34.0)
MCHC: 34.3 g/dL (ref 32.0–36.0)
MCV: 82.1 fL (ref 80.0–100.0)
MONO ABS: 1 10*3/uL (ref 0.2–1.0)
MONOS PCT: 17 %
Neutro Abs: 3.6 10*3/uL (ref 1.4–6.5)
Neutrophils Relative %: 58 %
PLATELETS: 108 10*3/uL — AB (ref 150–440)
RBC: 4.13 MIL/uL — ABNORMAL LOW (ref 4.40–5.90)
RDW: 15.9 % — AB (ref 11.5–14.5)
WBC: 6.2 10*3/uL (ref 3.8–10.6)

## 2016-01-22 LAB — TSH: TSH: 1.107 u[IU]/mL (ref 0.350–4.500)

## 2016-01-22 MED ORDER — GEMFIBROZIL 600 MG PO TABS
600.0000 mg | ORAL_TABLET | Freq: Two times a day (BID) | ORAL | Status: DC
  Administered 2016-01-22: 600 mg via ORAL
  Filled 2016-01-22: qty 1

## 2016-01-22 MED ORDER — GUAIFENESIN ER 600 MG PO TB12: 600.0000 mg | ORAL_TABLET | Freq: Two times a day (BID) | ORAL | Status: DC | PRN

## 2016-01-22 MED ORDER — DOXEPIN HCL 100 MG PO CAPS
100.0000 mg | ORAL_CAPSULE | Freq: Every morning | ORAL | Status: DC
  Filled 2016-01-22: qty 1

## 2016-01-22 MED ORDER — NITROGLYCERIN 0.4 MG SL SUBL: 0.4000 mg | SUBLINGUAL_TABLET | SUBLINGUAL | Status: DC | PRN

## 2016-01-22 MED ORDER — FAMOTIDINE 20 MG PO TABS
20.0000 mg | ORAL_TABLET | Freq: Two times a day (BID) | ORAL | Status: DC
  Administered 2016-01-22: 20 mg via ORAL
  Filled 2016-01-22: qty 1

## 2016-01-22 MED ORDER — LISINOPRIL 5 MG PO TABS
5.0000 mg | ORAL_TABLET | Freq: Every day | ORAL | Status: DC
Start: 2016-01-22 — End: 2016-01-22
  Administered 2016-01-22: 5 mg via ORAL
  Filled 2016-01-22: qty 1

## 2016-01-22 MED ORDER — ONDANSETRON HCL 4 MG/2ML IJ SOLN: 4.0000 mg | Freq: Four times a day (QID) | INTRAMUSCULAR | Status: DC | PRN

## 2016-01-22 MED ORDER — DOXEPIN HCL 100 MG PO CAPS: 100.0000 mg | ORAL_CAPSULE | Freq: Two times a day (BID) | ORAL | Status: DC

## 2016-01-22 MED ORDER — ACETAMINOPHEN 325 MG PO TABS: 650.0000 mg | ORAL_TABLET | Freq: Four times a day (QID) | ORAL | Status: DC | PRN

## 2016-01-22 MED ORDER — ATORVASTATIN CALCIUM 20 MG PO TABS
40.0000 mg | ORAL_TABLET | Freq: Every day | ORAL | Status: DC
  Filled 2016-01-22: qty 2

## 2016-01-22 MED ORDER — ISOSORBIDE DINITRATE 30 MG PO TABS
30.0000 mg | ORAL_TABLET | Freq: Three times a day (TID) | ORAL | Status: DC
  Administered 2016-01-22: 30 mg via ORAL
  Filled 2016-01-22: qty 1

## 2016-01-22 MED ORDER — DOXEPIN HCL 100 MG PO CAPS: 200.0000 mg | ORAL_CAPSULE | Freq: Every day | ORAL | Status: DC

## 2016-01-22 MED ORDER — METFORMIN HCL ER 500 MG PO TB24
1500.0000 mg | ORAL_TABLET | Freq: Every day | ORAL | Status: DC
  Filled 2016-01-22: qty 3

## 2016-01-22 MED ORDER — DOCUSATE SODIUM 100 MG PO CAPS
100.0000 mg | ORAL_CAPSULE | Freq: Two times a day (BID) | ORAL | Status: DC
  Filled 2016-01-22: qty 1

## 2016-01-22 MED ORDER — ACETAMINOPHEN 650 MG RE SUPP
650.0000 mg | Freq: Four times a day (QID) | RECTAL | Status: DC | PRN
Start: 2016-01-22 — End: 2016-01-22

## 2016-01-22 MED ORDER — DEXTROSE-NACL 5-0.45 % IV SOLN
INTRAVENOUS | Status: DC
  Administered 2016-01-22: 50 mL/h via INTRAVENOUS

## 2016-01-22 MED ORDER — DIAZEPAM 5 MG PO TABS: 5.0000 mg | ORAL_TABLET | Freq: Two times a day (BID) | ORAL | Status: DC | PRN

## 2016-01-22 MED ORDER — ONDANSETRON HCL 4 MG PO TABS: 4.0000 mg | ORAL_TABLET | Freq: Four times a day (QID) | ORAL | Status: DC | PRN

## 2016-01-22 MED ORDER — MECLIZINE HCL 25 MG PO TABS: 25.0000 mg | ORAL_TABLET | Freq: Three times a day (TID) | ORAL | Status: DC | PRN

## 2016-01-22 MED ORDER — PREGABALIN 50 MG PO CAPS
300.0000 mg | ORAL_CAPSULE | Freq: Two times a day (BID) | ORAL | Status: DC
Start: 2016-01-22 — End: 2016-01-22

## 2016-01-22 MED ORDER — CARVEDILOL 6.25 MG PO TABS
6.2500 mg | ORAL_TABLET | Freq: Two times a day (BID) | ORAL | Status: DC
  Administered 2016-01-22: 6.25 mg via ORAL
  Filled 2016-01-22: qty 1

## 2016-01-22 MED ORDER — DONEPEZIL HCL 5 MG PO TABS: 5.0000 mg | ORAL_TABLET | Freq: Every day | ORAL | Status: DC

## 2016-01-22 NOTE — Care Management Obs Status (Signed)
[place] NOTIFICATION   Patient Details  Name: TRAVOR KARRIKER MRN: [id number] Date of Birth: August 02, 1950   Medicare Observation Status Notification Given:  No (discharge order in less than 24 hours)    Ival Bible, RN 01/22/2016, 10:16 AM

## 2016-01-22 NOTE — ED Notes (Signed)
Pt given PO food and milk for CBG of 74.
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
## 2016-01-22 NOTE — Discharge Instructions (Signed)
[place] at [place]:  Diabetic diet  DISCHARGE CONDITION:  Stable  ACTIVITY:  Activity as tolerated  OXYGEN:  Home Oxygen: No.   Oxygen Delivery: room air  DISCHARGE LOCATION:  home    ADDITIONAL DISCHARGE INSTRUCTION: dont take  premeal insulin today , resume as before tomorrow Also take lantus 60units tonight, if bg<120 prior to dinner hold lantus as well , can go back to normal regimen tomm  you experience worsening of your admission symptoms, develop shortness of breath, life threatening emergency, suicidal or homicidal thoughts you must seek medical attention immediately by calling 911 or calling your MD immediately  if symptoms less severe.  You Must read complete instructions/literature along with all the possible adverse reactions/side effects for all the Medicines you take and that have been prescribed to you. Take any new Medicines after you have completely understood and accpet all the possible adverse reactions/side effects.   Please note  You were cared for by a hospitalist during your hospital stay. If you have any questions about your discharge medications or the care you received while you were in the hospital after you are discharged, you can call the unit and asked to speak with the hospitalist on call if the hospitalist that took care of you is not available. Once you are discharged, your primary care physician will handle any further medical issues. Please note that NO REFILLS for any discharge medications will be authorized once you are discharged, as it is imperative that you return to your primary care physician (or establish a relationship with a primary care physician if you do not have one) for your aftercare needs so that they can reassess your need for medications and monitor your lab values.
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
## 2016-01-22 NOTE — Progress Notes (Signed)
01/22/2016 10:49 AM  BP (!) 157/66 (BP Location: Right Arm)   Pulse 69   Temp 97.8 F (36.6 C) (Oral)   Resp 20   Ht 5\' 10"  (1.778 m)   Wt 101.2 kg (223 lb 3.2 oz)   SpO2 98%   BMI 32.03 kg/m  Patient discharged per MD orders. Discharge instructions reviewed with patient and patient verbalized understanding. IV removed per policy. Pt verbalized understanding of insulin administration. Discharged via wheelchair escorted by nursing staff.  Almedia Balls, RN

## 2016-01-22 NOTE — Discharge Summary (Signed)
[place] [facility], 65 y.o., DOB 28-Dec-1950, MRN [id number]. Admission date: 01/21/2016 Discharge Date 01/22/2016 Primary MD Minette Headland, NP Admitting Physician Harrie Foreman, MD  Admission Diagnosis  Insulin overdose, accidental or unintentional, initial encounter [T38.3X1A]  Discharge Diagnosis   Active Problems:  Accidental Insulin overdose  Diabetes type 2 Artery artery disease Cardiomyopathy Chronic kidney disease stage III Depression Essential hypertension Hyperlipidemia        Hospital CourseThe patient with past medical history of diabetes mellitus type 2 and ischemic cardiomyopathy presents to the emergency department after realizing that he took 90 units of his NovoLog instead of his Lantus 90 units he was supposed to take. Patient got worried and came to the ED. Poison control was called they've recommended observation him. Patient sugars have been actually high. He is currently denying any symptoms very anxious to go home. We will discharge him home. I recommended he monitor his sugars very closely. He is recommended not to take his pre-meal insulin today. Decrease dose of Lantus tonight and then to restart his regimen as before starting tomorrow.             Consults  None  Significant Tests:  See full reports for all details    No results found.     Today   Subjective:   Verlon Au  Feels well denies any chest pain or shortness of breath and very anxious to go home  Objective:   Blood pressure (!) 157/66, pulse 69, temperature 97.8 F (36.6 C), temperature source Oral, resp. rate 20, height 5\' 10"  (1.778 m), weight 223 lb 3.2 oz (101.2 kg), SpO2 98 %.  .  Intake/Output Summary (Last 24 hours) at 01/22/16 1310 Last data filed at 01/22/16 [id number]  Gross per 24 hour  Intake           366.67 ml  Output                0 ml  Net           366.67 ml    Exam VITAL SIGNS: Blood pressure  (!) 157/66, pulse 69, temperature 97.8 F (36.6 C), temperature source Oral, resp. rate 20, height 5\' 10"  (1.778 m), weight 223 lb 3.2 oz (101.2 kg), SpO2 98 %.  GENERAL:  65 y.o.-year-old patient lying in the bed with no acute distress.  EYES: Pupils equal, round, reactive to light and accommodation. No scleral icterus. Extraocular muscles intact.  HEENT: Head atraumatic, normocephalic. Oropharynx and nasopharynx clear.  NECK:  Supple, no jugular venous distention. No thyroid enlargement, no tenderness.  LUNGS: Normal breath sounds bilaterally, no wheezing, rales,rhonchi or crepitation. No use of accessory muscles of respiration.  CARDIOVASCULAR: S1, S2 normal. No murmurs, rubs, or gallops.  ABDOMEN: Soft, nontender, nondistended. Bowel sounds present. No organomegaly or mass.  EXTREMITIES: No pedal edema, cyanosis, or clubbing.  NEUROLOGIC: Cranial nerves II through XII are intact. Muscle strength 5/5 in all extremities. Sensation intact. Gait not checked.  PSYCHIATRIC: The patient is alert and oriented x 3.  SKIN: No obvious rash, lesion, or ulcer.   Data Review     CBC w Diff: Lab Results  Component Value Date   WBC 6.2 01/22/2016   HGB 11.6 (L) 01/22/2016   HGB 12.9 (L) 09/11/2011   HCT 33.9 (L) 01/22/2016   HCT 38.3 (L) 09/11/2011   PLT 108 (L) 01/22/2016   PLT 114 (L) 09/11/2011   LYMPHOPCT 21 01/22/2016  LYMPHOPCT 24.9 08/10/2011   MONOPCT 17 01/22/2016   MONOPCT 16.7 08/10/2011   EOSPCT 3 01/22/2016   EOSPCT 3.2 08/10/2011   BASOPCT 1 01/22/2016   BASOPCT 0.7 08/10/2011   CMP: Lab Results  Component Value Date   NA 139 01/22/2016   NA 139 09/12/2011   K 3.6 01/22/2016   K 4.5 09/12/2011   CL 108 01/22/2016   CL 105 09/12/2011   CO2 25 01/22/2016   CO2 27 09/12/2011   BUN 23 (H) 01/22/2016   BUN 30 (H) 09/12/2011   CREATININE 1.12 01/22/2016   CREATININE 1.69 (H) 09/12/2011   PROT 7.8 12/11/2015   ALBUMIN 4.2 12/11/2015   BILITOT 1.0 12/11/2015   ALKPHOS  58 12/11/2015   AST 20 12/11/2015   ALT 17 12/11/2015  .  Micro Results No results found for this or any previous visit (from the past 240 hour(s)).      Code Status Orders        Start     Ordered   01/22/16 0319  Full code  Continuous     01/22/16 0319    Code Status History    Date Active Date Inactive Code Status Order ID Comments User Context   12/12/2015  6:40 AM 12/14/2015  8:03 PM Full Code [id number]  Hubbard Robinson, MD ED   11/04/2014  8:21 AM 11/04/2014 12:44 PM Full Code [id number]  Isaias Cowman, MD Inpatient    Advance Directive Documentation   Flowsheet Row Most Recent Value  Type of Advance Directive  Living will, Healthcare Power of Attorney  Pre-existing out of facility DNR order (yellow form or pink MOST form)  No data  "MOST" Form in Place?  No data          Follow-up Information    CORNETTO, LINDSEY N, NP Follow up.   Specialty:  Hematology and Oncology Contact information: [address] [place] [address] [phone]           Discharge Medications     Medication List    TAKE these medications   acetaminophen 325 MG tablet Commonly known as:  TYLENOL Take 2 tablets (650 mg total) by mouth every 6 (six) hours as needed for mild pain, fever or headache (headache).   atorvastatin 40 MG tablet Commonly known as:  LIPITOR Take 40 mg by mouth daily.   carvedilol 6.25 MG tablet Commonly known as:  COREG Take 1 tablet (6.25 mg total) by mouth 2 (two) times daily.   diazepam 5 MG tablet Commonly known as:  VALIUM Take 5 mg by mouth every 12 (twelve) hours as needed for anxiety.   donepezil 5 MG tablet Commonly known as:  ARICEPT Take 5 mg by mouth at bedtime.   doxepin 100 MG capsule Commonly known as:  SINEQUAN Take 100-200 mg by mouth 2 (two) times daily. 100 mg in the morning and 200 mg at bedtime   gemfibrozil 600 MG tablet Commonly known as:  LOPID Take 600 mg by mouth 2 (two) times daily before a meal.    guaiFENesin 600 MG 12 hr tablet Commonly known as:  MUCINEX Take 600 mg by mouth 2 (two) times daily as needed for cough.   insulin aspart 100 UNIT/ML injection Commonly known as:  novoLOG Inject 15 Units into the skin 2 (two) times daily.   insulin glargine 100 UNIT/ML injection Commonly known as:  LANTUS Inject 90 Units into the skin at bedtime.   isosorbide dinitrate 30 MG tablet  Commonly known as:  ISORDIL Take 30 mg by mouth 3 (three) times daily.   meclizine 25 MG tablet Commonly known as:  ANTIVERT Take 25 mg by mouth 3 (three) times daily as needed.   metFORMIN 500 MG 24 hr tablet Commonly known as:  GLUCOPHAGE-XR Take 1,500 mg by mouth daily with breakfast.   nitroGLYCERIN 0.4 MG SL tablet Commonly known as:  NITROSTAT Place 0.4 mg under the tongue every 5 (five) minutes as needed.   pregabalin 300 MG capsule Commonly known as:  LYRICA Take 300 mg by mouth 2 (two) times daily.   quinapril 5 MG tablet Commonly known as:  ACCUPRIL Take 5 mg by mouth daily.   ranitidine 150 MG tablet Commonly known as:  ZANTAC Take 150 mg by mouth 2 (two) times daily.          Total Time in preparing paper work, data evaluation and todays exam - 35 minutes  Dustin Flock M.D on 01/22/2016 at 1:10 PM  [facility]   Office  [phone]

## 2016-01-22 NOTE — ED Notes (Signed)
Report called to floor, given to [place], [place]

## 2016-01-26 ENCOUNTER — Other Ambulatory Visit: Payer: Self-pay | Admitting: *Deleted

## 2016-01-26 NOTE — Patient Outreach (Signed)
Transition of care call completed, ongoing follow up on 10/23 to 10/25 hospitalization small bowel obstruction due to adhesions as well as recent hospitalization 12/2 to 12/3 for insulin overdose.  Spoke with pt, HIPAA verified, discussed recent hospitalization 12/2-12/3 to which pt reports took wrong shot 12/2 at night.  Pt reports he is suppose to take 15 units of Novolog insulin in am/pm, took 90 units of Novolog thinking it was the Lantus.   Pt reports he called poison control and was told to go to the hospital and when he arrived they were waiting for him.   Pt reports it took til  next day to bring up sugar..  Pt reports no medication changes at discharge, checked his sugar before taking Lantus the next night.   Pt reports that was the first time he made that mistake, both insulins were sitting side by side, prevent another mistake Moved his Novolog insulin.  Pt reports sugar this am was 140/ate late last night.  Pt reports bowels are doing good.  Pt reports followed up with Heart MD yesterday, was told did not need a defibrillator or pacemaker.    Plan:  As discussed, plan to follow up again next week telephonically as part of ongoing transition of care.    Zara Chess.   [place] [facility]  [phone]

## 2016-02-02 ENCOUNTER — Other Ambulatory Visit: Payer: Self-pay | Admitting: *Deleted

## 2016-02-02 ENCOUNTER — Ambulatory Visit: Payer: Self-pay | Admitting: *Deleted

## 2016-02-02 NOTE — Patient Outreach (Signed)
Transition of care call completed.   Received  A return phone call from pt to voice message left earlier by RN CM.  Pt referred to [place] CM after recent hospital visit 10/23-10/25 for small bowel obstruction secondary to adhesions.  Spoke with pt, HIPAA/identity verified.   Pt reports sugars are up in the daytime, range 100-125 in am, during the day around 200.  Pt reports he does not eat a lot of sweets but does have too much bread, had 2 sandwiches yesterday.   Pt reports had 2 banana sandwiches after dark last night, this am sugar was 150.  Pt reports he does not eat many bananas, but sometimes has cravings, gets the real ripe ones.   RN CM discussed with pt ripe ones have more sugar, try to limit banana size.  Pt reports he did see Dr. Gabriel Carina 2 weeks ago, was told A1C still up, MD checked his meter.   Pt reports he has not mixed up his insulin again, did report incident to MD.   Pt  reports bowels are doing good.    Plan:  As discussed with pt, plan to follow up again next week telephonically- final transition of care call.    Zara Chess.   [place] [facility]  [phone]

## 2016-02-02 NOTE — Patient Outreach (Signed)
Attempt made to contact pt as part of ongoing transition of care, follow up on recent hospitalization 10/23-10/25 small bowel obstruction due to adhesions.   Unable to leave a voice message as message on pt's phone- voice message not set up, please try call later.    Plan:  RN CM to follow up again tomorrow- transition of care call.     Zara Chess.   [place] [facility]  [phone]

## 2016-02-03 ENCOUNTER — Ambulatory Visit: Payer: Self-pay | Admitting: *Deleted

## 2016-02-06 ENCOUNTER — Other Ambulatory Visit: Payer: Self-pay | Admitting: *Deleted

## 2016-02-06 ENCOUNTER — Encounter: Payer: Self-pay | Admitting: *Deleted

## 2016-02-06 NOTE — Patient Outreach (Signed)
Final transition of care call completed, ongoing follow up on 10/23-10/25 hospitalization small bowel obstruction due to adhesions, 12/2-12/3 insulin overdose.   Spoke with pt, HIPAA/identity verified, discussed purpose of call.  Pt reports sugar today 140, doing better on cutting down on bread.  Pt reports had eggs/one piece of wheat bread this am.  Pt reports sugars average 150, lower in am.  Pt reports when he stays up late, has a night time snack, sugars higher. {Pt reports taking all medications as ordered.    Pt reports to see Dr. Kary Kos 1/10, usually sees the PA.    Plan:  As discussed with pt, today final transition of care call, plan to continue to follow with community nurse case management services- follow up again next month for home visit (self management of diabetes).             Plan to update [name] NP transition of care completed, to continue to provide community nurse case            Management services.              Zara Chess.   [place] [facility]  [phone]

## 2016-02-29 DIAGNOSIS — Z794 Long term (current) use of insulin: Secondary | ICD-10-CM | POA: Diagnosis not present

## 2016-02-29 DIAGNOSIS — E7800 Pure hypercholesterolemia, unspecified: Secondary | ICD-10-CM | POA: Diagnosis not present

## 2016-02-29 DIAGNOSIS — I25119 Atherosclerotic heart disease of native coronary artery with unspecified angina pectoris: Secondary | ICD-10-CM | POA: Diagnosis not present

## 2016-02-29 DIAGNOSIS — F0150 Vascular dementia without behavioral disturbance: Secondary | ICD-10-CM | POA: Diagnosis not present

## 2016-02-29 DIAGNOSIS — G609 Hereditary and idiopathic neuropathy, unspecified: Secondary | ICD-10-CM | POA: Diagnosis not present

## 2016-02-29 DIAGNOSIS — G309 Alzheimer's disease, unspecified: Secondary | ICD-10-CM | POA: Diagnosis not present

## 2016-02-29 DIAGNOSIS — N183 Chronic kidney disease, stage 3 (moderate): Secondary | ICD-10-CM | POA: Diagnosis not present

## 2016-02-29 DIAGNOSIS — E1165 Type 2 diabetes mellitus with hyperglycemia: Secondary | ICD-10-CM | POA: Diagnosis not present

## 2016-02-29 DIAGNOSIS — I10 Essential (primary) hypertension: Secondary | ICD-10-CM | POA: Diagnosis not present

## 2016-03-07 ENCOUNTER — Other Ambulatory Visit: Payer: Self-pay | Admitting: *Deleted

## 2016-03-07 NOTE — Patient Outreach (Addendum)
Follow up phone call, check on pt's  blood sugars, reschedule home visit.   Spoke with pt, HIPAA/identity verified.   Pt reports sugars are doing  bad, MD double my insulin dose, not able to get out and exercise.   Pt reports he is now taking 24 units of Novolog R twice a day, Lantus remains at 90 units at night.   Pt reports sugar this am was 170, at lunch 300 (runs higher at lunch).  Pt reports he called Dr. Gabriel Carina because his sugars were high and she increased his insulin, to follow up with her next month.  Pt reports his diet has not changed, needs some projects outdoors to work on where he walks back and forth, now just sitting around.  Pt reports his sugars were good when he was deer hunting.   Pt reports recently saw Dr. Kary Kos for the first time, [name] NP is leaving.     Plan:  As discussed with pt, plan to do home visit next week.      Zara Chess.   [place] [facility]  [phone]

## 2016-03-08 ENCOUNTER — Ambulatory Visit: Payer: Self-pay | Admitting: *Deleted

## 2016-03-16 ENCOUNTER — Other Ambulatory Visit: Payer: Self-pay | Admitting: *Deleted

## 2016-03-16 NOTE — Patient Outreach (Signed)
[facility] [facility]) Care Management   03/16/2016  Todd Spencer 1950-11-05 [id number]  Todd Spencer is an 66 y.o. male  Subjective:  Pt reports sugars were up, walked a mile last 3 days, sugars coming down, today was 98.  Pt reports  Dr. Gabriel Carina increased Novolog insulin 24 units twice a day, to have lab work done 2/26 see MD 3/2.   Pt reports  Saw Dr. Kary Kos for the first time recently as [name] NP left, received a good report.     Objective:   Vitals:   03/16/16 0936 03/16/16 1020  BP: (!) 80/50 98/60  Pulse: 95 (!) 56  Resp: 16     ROS  Physical Exam  Constitutional: He is oriented to person, place, and time. He appears well-developed and well-nourished.  Cardiovascular: Regular rhythm and normal heart sounds.   Heart rate 95, recheck 56.   Respiratory: Effort normal and breath sounds normal.  GI: Soft. Bowel sounds are normal.  Musculoskeletal: Normal range of motion.  Neurological: He is alert and oriented to person, place, and time.  Skin: Skin is warm and dry.  Psychiatric: He has a normal mood and affect. His behavior is normal. Judgment and thought content normal.    Encounter Medications:   Outpatient Encounter Prescriptions as of 03/16/2016  Medication Sig Note  . acetaminophen (TYLENOL) 325 MG tablet Take 2 tablets (650 mg total) by mouth every 6 (six) hours as needed for mild pain, fever or headache (headache). 03/16/2016: As needed.   Marland Kitchen atorvastatin (LIPITOR) 40 MG tablet Take 40 mg by mouth daily.   . carvedilol (COREG) 6.25 MG tablet Take 1 tablet (6.25 mg total) by mouth 2 (two) times daily.   . diazepam (VALIUM) 5 MG tablet Take 5 mg by mouth every 12 (twelve) hours as needed for anxiety.  01/06/2016: As needed.   . donepezil (ARICEPT) 5 MG tablet Take 5 mg by mouth at bedtime.   Marland Kitchen doxepin (SINEQUAN) 100 MG capsule Take 100-200 mg by mouth 2 (two) times daily. 100 mg in the morning and 200 mg at bedtime   . gemfibrozil (LOPID) 600 MG  tablet Take 600 mg by mouth 2 (two) times daily before a meal.   . insulin aspart (NOVOLOG) 100 UNIT/ML injection Inject 15 Units into the skin 2 (two) times daily. 03/16/2016: Per pt, increased to 24 units twice a day   . insulin glargine (LANTUS) 100 UNIT/ML injection Inject 90 Units into the skin at bedtime.   . meclizine (ANTIVERT) 25 MG tablet Take 25 mg by mouth 3 (three) times daily as needed. 03/16/2016: As needed.   . metFORMIN (GLUCOPHAGE-XR) 500 MG 24 hr tablet Take 1,500 mg by mouth daily with breakfast. 03/16/2016: Per pt takes at bedtime.   . phenylephrine (SUDAFED PE) 10 MG TABS tablet Take 10 mg by mouth every 4 (four) hours as needed. Pt takes twice a day   . pregabalin (LYRICA) 300 MG capsule Take 300 mg by mouth 2 (two) times daily.   . quinapril (ACCUPRIL) 5 MG tablet Take 5 mg by mouth daily.    . ranitidine (ZANTAC) 150 MG tablet Take 150 mg by mouth 2 (two) times daily.   Marland Kitchen guaiFENesin (MUCINEX) 600 MG 12 hr tablet Take 600 mg by mouth 2 (two) times daily as needed for cough. 01/06/2016: Taking once a day   . isosorbide dinitrate (ISORDIL) 30 MG tablet Take 30 mg by mouth 3 (three) times daily.  03/16/2016: Pt takes  differently, two tablets in am, one tablet in evening   . nitroGLYCERIN (NITROSTAT) 0.4 MG SL tablet Place 0.4 mg under the tongue every 5 (five) minutes as needed. 03/16/2016: Available if needed.    No facility-administered encounter medications on file as of 03/16/2016.     Functional Status:   In your present state of health, do you have any difficulty performing the following activities: 01/22/2016 01/06/2016  Hearing? Tempie Donning  Vision? N N  Difficulty concentrating or making decisions? N N  Walking or climbing stairs? N N  Dressing or bathing? N N  Doing errands, shopping? N N  Preparing Food and eating ? - N  Using the Toilet? - N  In the past six months, have you accidently leaked urine? - N  Do you have problems with loss of bowel control? - N  Managing  your Medications? - N  Managing your Finances? - N  Housekeeping or managing your Housekeeping? - N  Some recent data might be hidden    Fall/Depression Screening:    PHQ 2/9 Scores 01/03/2016  PHQ - 2 Score 0    Assessment:  Pleasant 66 year old gentleman, lives alone.                           DM:  Review of pt's glucometer readings- 135 this am, ranges 79-331.  Averages 7 day-189,                                14 day-190, 30 day- 206.                            Hypotension: BP today 80/50 right arm, asymptomatic (no c/o dizziness), pt drank 12 oz of                                    Water, recheck  5 minutes later,- result  98/60.  Reinforced with pt importance of                                    Staying hydrated.   Plan:   Plan to continue to provide pt with community nurse case management services, follow up again  Next month- home visit.             Pt to continue to check sugars/record/exercise/ongoing adherence with insulin/oral medication.   [facility] CM Care Plan Problem Two   Flowsheet Row Most Recent Value  Care Plan Problem Two  DM- elevated sugars   Role Documenting the Problem Two  Care Management Coordinator  Care Plan for Problem Two  Active  Interventions for Problem Two Long Term Goal   Reviewed with pt adherence with diabetic diet, medication, exercise.    [facility] Long Term Goal (31-90) days  re established- pt would see a 2 point drop in A1C in the next 40 days   [facility] Long Term Goal Start Date  03/16/16  [facility] CM Short Term Goal #1 (0-30 days)  Pt would continue to be  compliant with Diabetic diet in the next 30 days   [facility] CM Short Term Goal #1 Start Date  03/16/16  Interventions for Short Term Goal #2  Reviewed with pt diabetic diet.    [facility] CM Short Term Goal #2 (0-30 days)  re established- Pt would continue with daily exercise for the next 30 days   [facility] CM Short Term Goal #2 Start Date  03/16/16  Interventions for Short Term Goal #2  Encouraged pt to continue with  daily walks- showing effective in helping to bring sugars down.       Zara Chess.   [facility]  [phone]

## 2016-04-16 DIAGNOSIS — Z794 Long term (current) use of insulin: Secondary | ICD-10-CM | POA: Diagnosis not present

## 2016-04-16 DIAGNOSIS — E1165 Type 2 diabetes mellitus with hyperglycemia: Secondary | ICD-10-CM | POA: Diagnosis not present

## 2016-04-17 ENCOUNTER — Other Ambulatory Visit: Payer: Self-pay | Admitting: *Deleted

## 2016-04-17 ENCOUNTER — Encounter: Payer: Self-pay | Admitting: *Deleted

## 2016-04-17 NOTE — Patient Outreach (Signed)
[name]) Care Management   04/17/2016  Todd Spencer [birth year] [id number]  Todd Spencer is an 66 y.o. male  Subjective:  Pt reports he is sick to his stomach today, woke up during the night with heartburn, Thinks it came from eating unwashed grapes yesterday,ate 3 times more than he should have. Pt  reports sugar today was 159 after having  one home made peanut butter cracker an hour earlier.  Pt  Reports he is trying to watch intake of  carbohydrates, eating more meat, vegetables.  Pt  reports had lab work done yesterday, was  told could see his results but does not have a computer.  Pt reports sugars up and down, 2 low readings (56,75) and several high.  Pt reports his Primary Care MD is Dr. [name].    Objective:   Vitals:   04/17/16 0936  BP: (!) 100/58  Pulse: (!) 49  Resp: 20   ROS  Physical Exam  Constitutional: He is oriented to person, place, and time. He appears well-developed and well-nourished.  Cardiovascular: Normal heart sounds.   Heart rate 49   Respiratory: Effort normal and breath sounds normal.  GI: Soft. Bowel sounds are normal.  Musculoskeletal: Normal range of motion. He exhibits no edema.  Neurological: He is alert and oriented to person, place, and time.  Skin: Skin is warm and dry.  Psychiatric: He has a normal mood and affect. His behavior is normal. Judgment and thought content normal.    Encounter Medications:   Outpatient Encounter Prescriptions as of 04/17/2016  Medication Sig Note  . acetaminophen (TYLENOL) 325 MG tablet Take 2 tablets (650 mg total) by mouth every 6 (six) hours as needed for mild pain, fever or headache (headache). 03/16/2016: As needed.   Marland Kitchen atorvastatin (LIPITOR) 40 MG tablet Take 40 mg by mouth daily.   . carvedilol (COREG) 6.25 MG tablet Take 1 tablet (6.25 mg total) by mouth 2 (two) times daily.   . diazepam (VALIUM) 5 MG tablet Take 5 mg by mouth every 12 (twelve) hours as needed for anxiety.   01/06/2016: As needed.   . donepezil (ARICEPT) 5 MG tablet Take 5 mg by mouth at bedtime.   Marland Kitchen doxepin (SINEQUAN) 100 MG capsule Take 100-200 mg by mouth 2 (two) times daily. 100 mg in the morning and 200 mg at bedtime   . gemfibrozil (LOPID) 600 MG tablet Take 600 mg by mouth 2 (two) times daily before a meal.   . guaiFENesin (MUCINEX) 600 MG 12 hr tablet Take 600 mg by mouth 2 (two) times daily as needed for cough. 01/06/2016: Taking once a day   . insulin aspart (NOVOLOG) 100 UNIT/ML injection Inject 15 Units into the skin 2 (two) times daily. 03/16/2016: Per pt, increased to 24 units twice a day   . insulin glargine (LANTUS) 100 UNIT/ML injection Inject 90 Units into the skin at bedtime.   . isosorbide dinitrate (ISORDIL) 30 MG tablet Take 30 mg by mouth 3 (three) times daily.  03/16/2016: Pt takes differently, two tablets in am, one tablet in evening   . meclizine (ANTIVERT) 25 MG tablet Take 25 mg by mouth 3 (three) times daily as needed. 03/16/2016: As needed.   . metFORMIN (GLUCOPHAGE-XR) 500 MG 24 hr tablet Take 1,500 mg by mouth daily with breakfast. 03/16/2016: Per pt takes at bedtime.   . nitroGLYCERIN (NITROSTAT) 0.4 MG SL tablet Place 0.4 mg under the tongue every 5 (five) minutes as needed. 03/16/2016: Available  if needed.   . phenylephrine (SUDAFED PE) 10 MG TABS tablet Take 10 mg by mouth every 4 (four) hours as needed. Pt takes twice a day   . pregabalin (LYRICA) 300 MG capsule Take 300 mg by mouth 2 (two) times daily.   . quinapril (ACCUPRIL) 5 MG tablet Take 5 mg by mouth daily.    . ranitidine (ZANTAC) 150 MG tablet Take 150 mg by mouth 2 (two) times daily.    No facility-administered encounter medications on file as of 04/17/2016.     Functional Status:   In your present state of health, do you have any difficulty performing the following activities: 01/22/2016 01/06/2016  Hearing? Tempie Donning  Vision? N N  Difficulty concentrating or making decisions? N N  Walking or climbing stairs? N  N  Dressing or bathing? N N  Doing errands, shopping? N N  Preparing Food and eating ? - N  Using the Toilet? - N  In the past six months, have you accidently leaked urine? - N  Do you have problems with loss of bowel control? - N  Managing your Medications? - N  Managing your Finances? - N  Housekeeping or managing your Housekeeping? - N  Some recent data might be hidden    Fall/Depression Screening:    PHQ 2/9 Scores 01/03/2016  PHQ - 2 Score 0    Assessment:  Pleasant 66 year old gentleman, lives alone.   Lungs clear, c/o not feeling well today,requested RN CM to check his BP.   No c/o pain.  DM:  Review of pt's glucometer, sugar today was 159 non fasting, averages 7 day- 190, 14 day- 191, 30 day- 171.   Reviewed in Care Everywhere pt's recent A1C- result was 8.3 down from 8.6 3 months earlier- relayed information to pt.  Information on Carbohydrate counting given to pt today, reviewed carb counting, portion sizes.  Discussed with pt calling him one more time  next week, follow up on visit with Dr. Gabriel Carina to which pt did not feel necessary, so RN CM informed will discharge.   Plan:   As discussed with pt, plan to discharge from [facility] nurse case management services, some  Goals met.  Pt has 24 hour nurse advice line in sight, also provided [facility]'s main number to call if needs arise in the future.            Plan to send Dr. [name] case closure letter.            Plan to inform [facility] care management assistant  To close case.                               [facility] CM Care Plan Problem Two   Flowsheet Row Most Recent Value  Care Plan Problem Two  DM- elevated sugars   Role Documenting the Problem Two  Care Management Coordinator  Care Plan for Problem Two  Active  [facility] Long Term Goal (31-90) days  Pt would see a 2 point drop in A1C in the next 40 days   [facility] Long Term Goal Start Date  03/16/16  [facility] CM Short Term Goal #1 (0-30 days)  Pt would continue to be compliant with Diabetic diet in  the next 30 days   [facility] CM Short Term Goal #1 Start Date  03/16/16  [facility] CM Short Term Goal #1 Met Date   04/17/16 [not addressed in time frame]  Interventions for Short Term Goal #2   Provided pt with Carb counting sheet, reviewed carb counting, portion sizes   [facility] CM Short Term Goal #2 (0-30 days)  Pt would continue with daily exercise for the next 30 days   [facility] CM Short Term Goal #2 Start Date  03/16/16     Zara Chess.   [facility] Care Management  [phone]

## 2016-04-20 DIAGNOSIS — E1122 Type 2 diabetes mellitus with diabetic chronic kidney disease: Secondary | ICD-10-CM | POA: Diagnosis not present

## 2016-04-20 DIAGNOSIS — E11649 Type 2 diabetes mellitus with hypoglycemia without coma: Secondary | ICD-10-CM | POA: Diagnosis not present

## 2016-04-20 DIAGNOSIS — E1142 Type 2 diabetes mellitus with diabetic polyneuropathy: Secondary | ICD-10-CM | POA: Diagnosis not present

## 2016-04-20 DIAGNOSIS — E1165 Type 2 diabetes mellitus with hyperglycemia: Secondary | ICD-10-CM | POA: Diagnosis not present

## 2016-04-20 DIAGNOSIS — Z794 Long term (current) use of insulin: Secondary | ICD-10-CM | POA: Diagnosis not present

## 2016-04-20 DIAGNOSIS — E785 Hyperlipidemia, unspecified: Secondary | ICD-10-CM | POA: Diagnosis not present

## 2016-04-20 DIAGNOSIS — N183 Chronic kidney disease, stage 3 (moderate): Secondary | ICD-10-CM | POA: Diagnosis not present

## 2016-04-24 DIAGNOSIS — G4733 Obstructive sleep apnea (adult) (pediatric): Secondary | ICD-10-CM | POA: Diagnosis not present

## 2016-04-24 DIAGNOSIS — M7989 Other specified soft tissue disorders: Secondary | ICD-10-CM | POA: Diagnosis not present

## 2016-04-24 DIAGNOSIS — Z9989 Dependence on other enabling machines and devices: Secondary | ICD-10-CM | POA: Diagnosis not present

## 2016-05-23 DIAGNOSIS — Z9861 Coronary angioplasty status: Secondary | ICD-10-CM | POA: Diagnosis not present

## 2016-05-23 DIAGNOSIS — E1142 Type 2 diabetes mellitus with diabetic polyneuropathy: Secondary | ICD-10-CM | POA: Diagnosis not present

## 2016-05-23 DIAGNOSIS — E782 Mixed hyperlipidemia: Secondary | ICD-10-CM | POA: Diagnosis not present

## 2016-05-23 DIAGNOSIS — G4733 Obstructive sleep apnea (adult) (pediatric): Secondary | ICD-10-CM | POA: Diagnosis not present

## 2016-05-23 DIAGNOSIS — Z9889 Other specified postprocedural states: Secondary | ICD-10-CM | POA: Diagnosis not present

## 2016-05-23 DIAGNOSIS — I219 Acute myocardial infarction, unspecified: Secondary | ICD-10-CM | POA: Diagnosis not present

## 2016-05-23 DIAGNOSIS — I10 Essential (primary) hypertension: Secondary | ICD-10-CM | POA: Diagnosis not present

## 2016-05-23 DIAGNOSIS — I255 Ischemic cardiomyopathy: Secondary | ICD-10-CM | POA: Diagnosis not present

## 2016-05-24 DIAGNOSIS — G4733 Obstructive sleep apnea (adult) (pediatric): Secondary | ICD-10-CM | POA: Diagnosis not present

## 2016-06-01 DIAGNOSIS — D2261 Melanocytic nevi of right upper limb, including shoulder: Secondary | ICD-10-CM | POA: Diagnosis not present

## 2016-06-01 DIAGNOSIS — D485 Neoplasm of uncertain behavior of skin: Secondary | ICD-10-CM | POA: Diagnosis not present

## 2016-06-01 DIAGNOSIS — D2271 Melanocytic nevi of right lower limb, including hip: Secondary | ICD-10-CM | POA: Diagnosis not present

## 2016-06-01 DIAGNOSIS — L570 Actinic keratosis: Secondary | ICD-10-CM | POA: Diagnosis not present

## 2016-06-01 DIAGNOSIS — D225 Melanocytic nevi of trunk: Secondary | ICD-10-CM | POA: Diagnosis not present

## 2016-06-01 DIAGNOSIS — X32XXXA Exposure to sunlight, initial encounter: Secondary | ICD-10-CM | POA: Diagnosis not present

## 2016-06-01 DIAGNOSIS — D2272 Melanocytic nevi of left lower limb, including hip: Secondary | ICD-10-CM | POA: Diagnosis not present

## 2016-06-01 DIAGNOSIS — D1724 Benign lipomatous neoplasm of skin and subcutaneous tissue of left leg: Secondary | ICD-10-CM | POA: Diagnosis not present

## 2016-06-03 ENCOUNTER — Encounter: Payer: Self-pay | Admitting: Emergency Medicine

## 2016-06-03 ENCOUNTER — Emergency Department
Admission: EM | Admit: 2016-06-03 | Discharge: 2016-06-03 | Disposition: A | Discharge status: Home / Self Care | Payer: Medicare HMO | Attending: Emergency Medicine | Admitting: Emergency Medicine

## 2016-06-03 DIAGNOSIS — I130 Hypertensive heart and chronic kidney disease with heart failure and stage 1 through stage 4 chronic kidney disease, or unspecified chronic kidney disease: Secondary | ICD-10-CM | POA: Diagnosis not present

## 2016-06-03 DIAGNOSIS — Z79899 Other long term (current) drug therapy: Secondary | ICD-10-CM | POA: Diagnosis not present

## 2016-06-03 DIAGNOSIS — R531 Weakness: Secondary | ICD-10-CM | POA: Insufficient documentation

## 2016-06-03 DIAGNOSIS — I2510 Atherosclerotic heart disease of native coronary artery without angina pectoris: Secondary | ICD-10-CM | POA: Diagnosis not present

## 2016-06-03 DIAGNOSIS — Z794 Long term (current) use of insulin: Secondary | ICD-10-CM | POA: Insufficient documentation

## 2016-06-03 DIAGNOSIS — I5022 Chronic systolic (congestive) heart failure: Secondary | ICD-10-CM | POA: Insufficient documentation

## 2016-06-03 DIAGNOSIS — N183 Chronic kidney disease, stage 3 (moderate): Secondary | ICD-10-CM | POA: Diagnosis not present

## 2016-06-03 DIAGNOSIS — E1122 Type 2 diabetes mellitus with diabetic chronic kidney disease: Secondary | ICD-10-CM | POA: Insufficient documentation

## 2016-06-03 DIAGNOSIS — I252 Old myocardial infarction: Secondary | ICD-10-CM | POA: Insufficient documentation

## 2016-06-03 DIAGNOSIS — E1140 Type 2 diabetes mellitus with diabetic neuropathy, unspecified: Secondary | ICD-10-CM | POA: Insufficient documentation

## 2016-06-03 DIAGNOSIS — R079 Chest pain, unspecified: Secondary | ICD-10-CM | POA: Insufficient documentation

## 2016-06-03 DIAGNOSIS — Z87891 Personal history of nicotine dependence: Secondary | ICD-10-CM | POA: Insufficient documentation

## 2016-06-03 LAB — URINALYSIS, COMPLETE (UACMP) WITH MICROSCOPIC
Bacteria, UA: NONE SEEN
Bilirubin Urine: NEGATIVE
Glucose, UA: 50 mg/dL — AB
Hgb urine dipstick: NEGATIVE
KETONES UR: NEGATIVE mg/dL
LEUKOCYTES UA: NEGATIVE
Nitrite: NEGATIVE
PROTEIN: NEGATIVE mg/dL
SQUAMOUS EPITHELIAL / LPF: NONE SEEN
Specific Gravity, Urine: 1.017 (ref 1.005–1.030)
pH: 7 (ref 5.0–8.0)

## 2016-06-03 LAB — BASIC METABOLIC PANEL
ANION GAP: 7 (ref 5–15)
BUN: 28 mg/dL — ABNORMAL HIGH (ref 6–20)
CO2: 29 mmol/L (ref 22–32)
CREATININE: 1.54 mg/dL — AB (ref 0.61–1.24)
Calcium: 9.3 mg/dL (ref 8.9–10.3)
Chloride: 101 mmol/L (ref 101–111)
GFR calc non Af Amer: 45 mL/min — ABNORMAL LOW (ref >60)
GFR, EST AFRICAN AMERICAN: 52 mL/min — AB (ref >60)
Glucose, Bld: 211 mg/dL — ABNORMAL HIGH (ref 65–99)
POTASSIUM: 4.3 mmol/L (ref 3.5–5.1)
SODIUM: 137 mmol/L (ref 135–145)

## 2016-06-03 LAB — CBC
HEMATOCRIT: 30.3 % — AB (ref 40.0–52.0)
HEMOGLOBIN: 10 g/dL — AB (ref 13.0–18.0)
MCH: 24.7 pg — ABNORMAL LOW (ref 26.0–34.0)
MCHC: 33 g/dL (ref 32.0–36.0)
MCV: 75 fL — AB (ref 80.0–100.0)
PLATELETS: 130 10*3/uL — AB (ref 150–440)
RBC: 4.04 MIL/uL — AB (ref 4.40–5.90)
RDW: 17 % — ABNORMAL HIGH (ref 11.5–14.5)
WBC: 4.8 10*3/uL (ref 3.8–10.6)

## 2016-06-03 LAB — TROPONIN I: Troponin I: <0.03 ng/mL (ref <0.03)

## 2016-06-03 NOTE — Discharge Instructions (Signed)
As we discussed, your workup today was reassuring.  Though we do not know exactly what is causing your symptoms, it appears that you have no emergent medical condition at this time and that you are safe to go home and follow up as recommended in this paperwork. ° °Please return immediately to the Emergency Department if you develop any new or worsening symptoms that concern you. ° °

## 2016-06-03 NOTE — ED Notes (Signed)
Pt ambulated to bathroom with no difficulties. Pt stated felt better.

## 2016-06-03 NOTE — ED Provider Notes (Signed)
[facility] Emergency Department Provider Note  ____________________________________________   First MD Initiated Contact with Patient 06/03/16 [phone]     (approximate)  I have reviewed the triage vital signs and the nursing notes.   HISTORY  Chief Complaint Weakness and Chest Pain    HPI Todd Spencer is a 66 y.o. male With an extensive history of heart disease and other medical problems as listed below who presents for evaluation of generalized weakness.  He reports that he was feeling fine and went to bed and then woke up a few hours later.  When he tried to get out of bed he felt very weak all over, as if it took a great effort for him to remove his blanket.  After he felt a little bit better he drove himself to the emergency department for evaluation.  He denies chest pain, shortness of breath, nausea, vomiting, abdominal pain.  He states that he feels much better now that he did earlier.  He has never had an episode of weakness like this in the past but given his cardiac history he was concerned and thought he should get checked out.   Past Medical History:  Diagnosis Date  . Anginal pain ([place])   . CAD (coronary artery disease)    s/p stents  . Cardiomyopathy ([place])    ischemic cardiomyopathy, EF 35%  . CKD (chronic kidney disease) stage 3, GFR 30-59 ml/min    baseline cr of 1.8  . Coronary artery disease   . Depression   . Diabetes mellitus without complication ([place])   . History of hiatal hernia   . HOH (hard of hearing)   . Hyperlipidemia   . Hypertension   . Myocardial infarction ([place])   . Neuropathy   . Sleep apnea    CPAP  . Wheezing     Patient Active Problem List   Diagnosis Date Noted  . Insulin overdose 01/22/2016  . CKD (chronic kidney disease) stage 3, GFR 30-59 ml/min 12/12/2015  . Diabetic peripheral neuropathy ([place]) 12/12/2015  . DM2 (diabetes mellitus, type 2) ([place]) 12/12/2015  . Small bowel obstruction due to adhesions  ([place]) 12/12/2015  . Intestinal adhesions with obstruction ([place])   . Obesity, Class I, BMI 30-34.9 05/11/2015  . Mixed Alzheimer's and vascular dementia 05/04/2015  . Uncontrolled diabetes mellitus type 2 without complications ([place]) 33/29/5188  . Cardiomyopathy, ischemic 11/04/2014  . Chronic systolic heart failure ([place]) 11/04/2014  . Atherosclerotic heart disease of native coronary artery without angina pectoris 11/04/2014  . SOB (shortness of breath) on exertion 04/26/2014  . Cardiomyopathy ([place]) 04/23/2014  . H/O cardiac catheterization 04/23/2014  . H/O degenerative disc disease 04/23/2014  . History of PTCA 04/23/2014  . HTN (hypertension) 04/23/2014  . Hyperlipidemia, unspecified 04/23/2014  . MI (myocardial infarction) ([place]) 04/23/2014  . OSA (obstructive sleep apnea) 04/23/2014    Past Surgical History:  Procedure Laterality Date  . CARDIAC CATHETERIZATION    . CARDIAC CATHETERIZATION Left 11/04/2014   Procedure: Left Heart Cath and Coronary Angiography;  Surgeon: Isaias Cowman, MD;  Location: [facility];  Service: Cardiovascular;  Laterality: Left;  . CATARACT EXTRACTION W/PHACO Right 07/12/2014   Procedure: CATARACT EXTRACTION PHACO AND INTRAOCULAR LENS PLACEMENT (IOC);  Surgeon: Estill Cotta, MD;  Location: [facility] OR[facility];  Service: Ophthalmology;  Laterality: Right;  [place] 01:09 AP% 22.8 CDE 26.03  . CATARACT EXTRACTION W/PHACO Left 10/18/2014   Procedure: CATARACT EXTRACTION PHACO AND INTRAOCULAR LENS PLACEMENT (IOC);  Surgeon: Estill Cotta,  MD;  Location: [facility] OR[facility];  Service: Ophthalmology;  Laterality: Left;  [place]: 01L09.4 AP%: 24.2 CDE: 28.45 Lot # [id number]  . CHOLECYSTECTOMY    . CORONARY ANGIOPLASTY    . EYE SURGERY    . gsw     abd  . HERNIA REPAIR      Prior to Admission medications   Medication Sig Start Date End Date Taking? Authorizing Provider  acetaminophen (TYLENOL) 325 MG tablet Take 2 tablets (650 mg total) by mouth every 6 (six) hours  as needed for mild pain, fever or headache (headache). 12/14/15   Olean Ree, MD  atorvastatin (LIPITOR) 40 MG tablet Take 40 mg by mouth daily.    Historical Provider, MD  carvedilol (COREG) 3.125 MG tablet Take 3.125 mg by mouth 2 (two) times daily with a meal.    Historical Provider, MD  carvedilol (COREG) 6.25 MG tablet Take 1 tablet (6.25 mg total) by mouth 2 (two) times daily. Patient not taking: Reported on 04/17/2016 02/04/15   Deboraha Sprang, MD  diazepam (VALIUM) 5 MG tablet Take 5 mg by mouth every 12 (twelve) hours as needed for anxiety.     Historical Provider, MD  donepezil (ARICEPT) 10 MG tablet Take 10 mg by mouth at bedtime.    Historical Provider, MD  donepezil (ARICEPT) 5 MG tablet Take 5 mg by mouth at bedtime.    Historical Provider, MD  doxepin (SINEQUAN) 100 MG capsule Take 100-200 mg by mouth 2 (two) times daily. 100 mg in the morning and 200 mg at bedtime    Historical Provider, MD  gemfibrozil (LOPID) 600 MG tablet Take 600 mg by mouth 2 (two) times daily before a meal.    Historical Provider, MD  guaiFENesin (MUCINEX) 600 MG 12 hr tablet Take 600 mg by mouth 2 (two) times daily as needed for cough.    Historical Provider, MD  insulin aspart (NOVOLOG) 100 UNIT/ML injection Inject 15 Units into the skin 2 (two) times daily.    Historical Provider, MD  insulin glargine (LANTUS) 100 UNIT/ML injection Inject 90 Units into the skin at bedtime.    Historical Provider, MD  isosorbide dinitrate (ISORDIL) 30 MG tablet Take 30 mg by mouth 3 (three) times daily.     Historical Provider, MD  meclizine (ANTIVERT) 25 MG tablet Take 25 mg by mouth 3 (three) times daily as needed.    Historical Provider, MD  metFORMIN (GLUCOPHAGE-XR) 500 MG 24 hr tablet Take 1,500 mg by mouth daily with breakfast.    Historical Provider, MD  nitroGLYCERIN (NITROSTAT) 0.4 MG SL tablet Place 0.4 mg under the tongue every 5 (five) minutes as needed.    Historical Provider, MD  phenylephrine (SUDAFED PE) 10  MG TABS tablet Take 10 mg by mouth every 4 (four) hours as needed. Pt takes twice a day    Historical Provider, MD  pregabalin (LYRICA) 300 MG capsule Take 300 mg by mouth 2 (two) times daily.    Historical Provider, MD  quinapril (ACCUPRIL) 5 MG tablet Take 5 mg by mouth daily.     Historical Provider, MD  ranitidine (ZANTAC) 150 MG tablet Take 150 mg by mouth 2 (two) times daily.    Historical Provider, MD    Allergies Patient has no known allergies.  Family History  Problem Relation Age of Onset  . Multiple myeloma Father   . Heart failure Maternal Aunt   . Heart failure Maternal Uncle   . Heart failure Maternal Uncle   . Heart  failure Maternal Aunt     Social History Social History  Substance Use Topics  . Smoking status: Former Smoker    Quit date: 07/19/1993  . Smokeless tobacco: Never Used  . Alcohol use No    Review of Systems Constitutional: No fever/chills Eyes: No visual changes. ENT: No sore throat. Cardiovascular: Denies chest pain. Respiratory: Denies shortness of breath. Gastrointestinal: No abdominal pain.  No nausea, no vomiting.  No diarrhea.  No constipation. Genitourinary: Negative for dysuria. Musculoskeletal: Negative for back pain. Skin: Negative for rash. Neurological: Negative for headaches, focal weakness or numbness.  Generalized weakness throughout.  10-point ROS otherwise negative.  ____________________________________________   PHYSICAL EXAM:  VITAL SIGNS: ED Triage Vitals  Enc Vitals Group     BP 06/03/16 0232 (!) 159/74     Pulse Rate 06/03/16 0232 (!) 56     Resp 06/03/16 0232 16     Temp 06/03/16 0232 97.9 F (36.6 C)     Temp Source 06/03/16 0232 Oral     SpO2 06/03/16 0232 97 %     Weight 06/03/16 0228 230 lb (104.3 kg)     Height 06/03/16 0228 '5\' 10"'$  (1.778 m)     Head Circumference --      Peak Flow --      Pain Score 06/03/16 0228 3     Pain Loc --      Pain Edu? --      Excl. in [place]? --     Constitutional: Alert  and oriented. Well appearing and in no acute distress. Eyes: Conjunctivae are normal. PERRL. EOMI. Head: Atraumatic. Nose: No congestion/rhinnorhea. Mouth/Throat: Mucous membranes are moist. Neck: No stridor.  No meningeal signs.   Cardiovascular: Normal rate, regular rhythm. Good peripheral circulation. Grossly normal heart sounds. Respiratory: Normal respiratory effort.  No retractions. Lungs CTAB. Gastrointestinal: Soft and nontender. No distention.  Musculoskeletal: No lower extremity tenderness nor edema. No gross deformities of extremities. Neurologic:  Normal speech and language. No gross focal neurologic deficits are appreciated.  Skin:  Skin is warm, dry and intact. No rash noted. Psychiatric: Mood and affect are normal. Speech and behavior are normal.  ____________________________________________   LABS (all labs ordered are listed, but only abnormal results are displayed)  Labs Reviewed  BASIC METABOLIC PANEL - Abnormal; Notable for the following:       Result Value   Glucose, Bld 211 (*)    BUN 28 (*)    Creatinine, Ser 1.54 (*)    GFR calc non Af Amer 45 (*)    GFR calc Af Amer 52 (*)    All other components within normal limits  CBC - Abnormal; Notable for the following:    RBC 4.04 (*)    Hemoglobin 10.0 (*)    HCT 30.3 (*)    MCV 75.0 (*)    MCH 24.7 (*)    RDW 17.0 (*)    Platelets 130 (*)    All other components within normal limits  URINALYSIS, COMPLETE (UACMP) WITH MICROSCOPIC - Abnormal; Notable for the following:    Color, Urine YELLOW (*)    APPearance CLEAR (*)    Glucose, UA 50 (*)    All other components within normal limits  TROPONIN I  TROPONIN I   ____________________________________________  EKG  ED ECG REPORT #1 I, Preslei Blakley, the attending physician, personally viewed and interpreted this ECG.  Date: 06/03/2016 EKG Time: 02:31 Rate: 65 Rhythm: normal sinus rhythm QRS Axis: normal Intervals: normal ST/T Wave  abnormalities:  normal Conduction Disturbances: none Narrative Interpretation: unremarkable   ED ECG REPORT #2 I, Gerold Sar, the attending physician, personally viewed and interpreted this ECG.  Date: 06/03/2016 EKG Time: 05:02 Rate: 57 Rhythm: borderline sinus bradycardia QRS Axis: normal Intervals: questionable intraventricular conduction delay.  Small amplitude P waves ST/T Wave abnormalities: Non-specific ST segment / T-wave changes, but no evidence of acute ischemia. Conduction Disturbances: none Narrative Interpretation: unremarkable  ____________________________________________  RADIOLOGY   No results found.  ____________________________________________   PROCEDURES  Critical Care performed: No   Procedure(s) performed:   Procedures   ____________________________________________   INITIAL IMPRESSION / ASSESSMENT AND PLAN / ED COURSE  Pertinent labs & imaging results that were available during my care of the patient were reviewed by me and considered in my medical decision making (see chart for details).  The patient Is well-appearing and in no acute distress.  He never had any chest pain or shortness of breath.  He was mostly concerned based on his history which is understandable because it is extensive.  However I wonder if he awoke from a deep sleep and was simply still mostly asleep and generally weak as a result.  He was awake and alert and strong enough to drive himself to the emergency department and he states he feels much better at this time.  Repeated an EKG because while he was sleeping in the exam room he becomes significantly bradycardic down in the 50s, but when I wake him up and he starts talking he goes back up into the 60s.  Although his P waves are very low amplitude he does have P waves throughout and he does not appear to be in a junctional rhythm and does not have any evidence of heart block.  I will check a second troponin and anticipate discharge with  close follow-up with his cardiologist.  He is in agreement with this plan.   Clinical Course as of Jun 03 724  Sun Jun 03, 2016  0714 Patient bradycardiac at 50 while sleeping, but went up the 70s when I awoke him.  He feels much better after several more hours of sleep. We will have him ambulate to the bathroom to make sure he no longer feels weak, but I anticipate he will be successful.  He wants to go home and will follow up with Dr. Saralyn Pilar.  [CF]    Clinical Course User Index [CF] Hinda Kehr, MD    ____________________________________________  FINAL CLINICAL IMPRESSION(S) / ED DIAGNOSES  Final diagnoses:  Generalized weakness     MEDICATIONS GIVEN DURING THIS VISIT:  Medications - No data to display   NEW OUTPATIENT MEDICATIONS STARTED DURING THIS VISIT:  New Prescriptions   No medications on file    Modified Medications   No medications on file    Discontinued Medications   No medications on file     Note:  This document was prepared using Dragon voice recognition software and may include unintentional dictation errors.    Hinda Kehr, MD 06/03/16 [phone]

## 2016-06-03 NOTE — ED Triage Notes (Signed)
Pt to triage via [place] in NAD, report awoke tonight extremely weak, took him 45 minutes to get out of bed.  Pt reports mild chest cramping as well.  Pt checked BG at home and was 220.  Pt reports cardiac hx.

## 2016-07-11 DIAGNOSIS — R413 Other amnesia: Secondary | ICD-10-CM | POA: Diagnosis not present

## 2016-07-11 DIAGNOSIS — M792 Neuralgia and neuritis, unspecified: Secondary | ICD-10-CM | POA: Diagnosis not present

## 2016-07-26 DIAGNOSIS — E11649 Type 2 diabetes mellitus with hypoglycemia without coma: Secondary | ICD-10-CM | POA: Diagnosis not present

## 2016-07-26 DIAGNOSIS — Z794 Long term (current) use of insulin: Secondary | ICD-10-CM | POA: Diagnosis not present

## 2016-07-31 DIAGNOSIS — E1165 Type 2 diabetes mellitus with hyperglycemia: Secondary | ICD-10-CM | POA: Diagnosis not present

## 2016-07-31 DIAGNOSIS — E1142 Type 2 diabetes mellitus with diabetic polyneuropathy: Secondary | ICD-10-CM | POA: Diagnosis not present

## 2016-07-31 DIAGNOSIS — Z794 Long term (current) use of insulin: Secondary | ICD-10-CM | POA: Diagnosis not present

## 2016-07-31 DIAGNOSIS — N183 Chronic kidney disease, stage 3 (moderate): Secondary | ICD-10-CM | POA: Diagnosis not present

## 2016-07-31 DIAGNOSIS — E1159 Type 2 diabetes mellitus with other circulatory complications: Secondary | ICD-10-CM | POA: Diagnosis not present

## 2016-07-31 DIAGNOSIS — E113293 Type 2 diabetes mellitus with mild nonproliferative diabetic retinopathy without macular edema, bilateral: Secondary | ICD-10-CM | POA: Diagnosis not present

## 2016-07-31 DIAGNOSIS — E1122 Type 2 diabetes mellitus with diabetic chronic kidney disease: Secondary | ICD-10-CM | POA: Diagnosis not present

## 2016-08-14 DIAGNOSIS — N183 Chronic kidney disease, stage 3 (moderate): Secondary | ICD-10-CM | POA: Diagnosis not present

## 2016-08-14 DIAGNOSIS — E118 Type 2 diabetes mellitus with unspecified complications: Secondary | ICD-10-CM | POA: Diagnosis not present

## 2016-08-14 DIAGNOSIS — G4733 Obstructive sleep apnea (adult) (pediatric): Secondary | ICD-10-CM | POA: Diagnosis not present

## 2016-08-28 ENCOUNTER — Emergency Department
Admission: EM | Admit: 2016-08-28 | Discharge: 2016-08-28 | Disposition: A | Discharge status: Home / Self Care | Payer: Medicare HMO | Attending: Student in an Organized Health Care Education/Training Program | Admitting: Student in an Organized Health Care Education/Training Program

## 2016-08-28 ENCOUNTER — Emergency Department: Payer: Medicare HMO

## 2016-08-28 ENCOUNTER — Encounter: Payer: Self-pay | Admitting: Emergency Medicine

## 2016-08-28 DIAGNOSIS — N183 Chronic kidney disease, stage 3 (moderate): Secondary | ICD-10-CM | POA: Diagnosis not present

## 2016-08-28 DIAGNOSIS — I129 Hypertensive chronic kidney disease with stage 1 through stage 4 chronic kidney disease, or unspecified chronic kidney disease: Secondary | ICD-10-CM | POA: Insufficient documentation

## 2016-08-28 DIAGNOSIS — G4733 Obstructive sleep apnea (adult) (pediatric): Secondary | ICD-10-CM | POA: Diagnosis not present

## 2016-08-28 DIAGNOSIS — Y939 Activity, unspecified: Secondary | ICD-10-CM | POA: Insufficient documentation

## 2016-08-28 DIAGNOSIS — I10 Essential (primary) hypertension: Secondary | ICD-10-CM | POA: Diagnosis not present

## 2016-08-28 DIAGNOSIS — Z794 Long term (current) use of insulin: Secondary | ICD-10-CM | POA: Diagnosis not present

## 2016-08-28 DIAGNOSIS — E1140 Type 2 diabetes mellitus with diabetic neuropathy, unspecified: Secondary | ICD-10-CM | POA: Diagnosis not present

## 2016-08-28 DIAGNOSIS — Y929 Unspecified place or not applicable: Secondary | ICD-10-CM | POA: Diagnosis not present

## 2016-08-28 DIAGNOSIS — S81802A Unspecified open wound, left lower leg, initial encounter: Secondary | ICD-10-CM | POA: Insufficient documentation

## 2016-08-28 DIAGNOSIS — T148XXA Other injury of unspecified body region, initial encounter: Secondary | ICD-10-CM

## 2016-08-28 DIAGNOSIS — Z87891 Personal history of nicotine dependence: Secondary | ICD-10-CM | POA: Insufficient documentation

## 2016-08-28 DIAGNOSIS — S81002A Unspecified open wound, left knee, initial encounter: Secondary | ICD-10-CM | POA: Diagnosis not present

## 2016-08-28 DIAGNOSIS — S8992XA Unspecified injury of left lower leg, initial encounter: Secondary | ICD-10-CM | POA: Diagnosis present

## 2016-08-28 DIAGNOSIS — W3189XA Contact with other specified machinery, initial encounter: Secondary | ICD-10-CM | POA: Diagnosis not present

## 2016-08-28 DIAGNOSIS — E1122 Type 2 diabetes mellitus with diabetic chronic kidney disease: Secondary | ICD-10-CM | POA: Diagnosis not present

## 2016-08-28 DIAGNOSIS — Z79899 Other long term (current) drug therapy: Secondary | ICD-10-CM | POA: Insufficient documentation

## 2016-08-28 DIAGNOSIS — I2510 Atherosclerotic heart disease of native coronary artery without angina pectoris: Secondary | ICD-10-CM | POA: Insufficient documentation

## 2016-08-28 DIAGNOSIS — S81822A Laceration with foreign body, left lower leg, initial encounter: Secondary | ICD-10-CM | POA: Diagnosis not present

## 2016-08-28 DIAGNOSIS — Z9989 Dependence on other enabling machines and devices: Secondary | ICD-10-CM | POA: Diagnosis not present

## 2016-08-28 DIAGNOSIS — E119 Type 2 diabetes mellitus without complications: Secondary | ICD-10-CM | POA: Diagnosis not present

## 2016-08-28 DIAGNOSIS — Y999 Unspecified external cause status: Secondary | ICD-10-CM | POA: Diagnosis not present

## 2016-08-28 DIAGNOSIS — E782 Mixed hyperlipidemia: Secondary | ICD-10-CM | POA: Diagnosis not present

## 2016-08-28 MED ORDER — LIDOCAINE HCL (PF) 1 % IJ SOLN
INTRAMUSCULAR | Status: AC
  Administered 2016-08-28: 5 mL
  Filled 2016-08-28: qty 5

## 2016-08-28 MED ORDER — CEPHALEXIN 500 MG PO CAPS: 500.0000 mg | ORAL_CAPSULE | Freq: Three times a day (TID) | ORAL | 0 refills | Status: AC

## 2016-08-28 MED ORDER — LIDOCAINE HCL 1 % IJ SOLN
5.0000 mL | Freq: Once | INTRAMUSCULAR | Status: AC
  Administered 2016-08-28: 5 mL

## 2016-08-28 NOTE — ED Triage Notes (Signed)
Pt was cutting a tree limb with a chain saw, pt cut left lower leg, laceration is approximately 3 inches, small amount of bleeding noted, dressing applied

## 2016-08-28 NOTE — ED Notes (Addendum)
Patient with laceration below left knee. Bleeding controlled with dressing at this time. Patient able to move foot and toes without trouble. Reports tetanus shot in the last 5 years.

## 2016-08-28 NOTE — ED Notes (Signed)
X-ray at bedside
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
## 2016-09-03 DIAGNOSIS — G4733 Obstructive sleep apnea (adult) (pediatric): Secondary | ICD-10-CM | POA: Diagnosis not present

## 2016-09-17 ENCOUNTER — Other Ambulatory Visit: Payer: Self-pay | Admitting: Emergency Medicine

## 2016-09-17 MED ORDER — INSULIN GLARGINE 100 UNIT/ML ~~LOC~~ SOLN: 90.0000 [IU] | Freq: Every day | SUBCUTANEOUS | 0 refills | Status: DC

## 2016-09-25 DIAGNOSIS — I10 Essential (primary) hypertension: Secondary | ICD-10-CM | POA: Diagnosis not present

## 2016-09-25 DIAGNOSIS — E785 Hyperlipidemia, unspecified: Secondary | ICD-10-CM | POA: Diagnosis not present

## 2016-09-25 DIAGNOSIS — E1165 Type 2 diabetes mellitus with hyperglycemia: Secondary | ICD-10-CM | POA: Diagnosis not present

## 2016-09-25 DIAGNOSIS — I429 Cardiomyopathy, unspecified: Secondary | ICD-10-CM | POA: Diagnosis not present

## 2016-09-25 DIAGNOSIS — G4733 Obstructive sleep apnea (adult) (pediatric): Secondary | ICD-10-CM | POA: Diagnosis not present

## 2016-09-25 DIAGNOSIS — R0602 Shortness of breath: Secondary | ICD-10-CM | POA: Diagnosis not present

## 2016-09-25 DIAGNOSIS — Z9889 Other specified postprocedural states: Secondary | ICD-10-CM | POA: Diagnosis not present

## 2016-10-15 ENCOUNTER — Emergency Department
Admission: EM | Admit: 2016-10-15 | Discharge: 2016-10-16 | Disposition: A | Discharge status: Home / Self Care | Payer: Medicare HMO | Attending: Emergency Medicine | Admitting: Emergency Medicine

## 2016-10-15 DIAGNOSIS — Z794 Long term (current) use of insulin: Secondary | ICD-10-CM | POA: Insufficient documentation

## 2016-10-15 DIAGNOSIS — Z79899 Other long term (current) drug therapy: Secondary | ICD-10-CM | POA: Insufficient documentation

## 2016-10-15 DIAGNOSIS — I252 Old myocardial infarction: Secondary | ICD-10-CM | POA: Diagnosis not present

## 2016-10-15 DIAGNOSIS — R079 Chest pain, unspecified: Secondary | ICD-10-CM

## 2016-10-15 DIAGNOSIS — I5022 Chronic systolic (congestive) heart failure: Secondary | ICD-10-CM | POA: Insufficient documentation

## 2016-10-15 DIAGNOSIS — I2510 Atherosclerotic heart disease of native coronary artery without angina pectoris: Secondary | ICD-10-CM | POA: Diagnosis not present

## 2016-10-15 DIAGNOSIS — E119 Type 2 diabetes mellitus without complications: Secondary | ICD-10-CM | POA: Insufficient documentation

## 2016-10-15 DIAGNOSIS — E1140 Type 2 diabetes mellitus with diabetic neuropathy, unspecified: Secondary | ICD-10-CM | POA: Insufficient documentation

## 2016-10-15 DIAGNOSIS — N183 Chronic kidney disease, stage 3 (moderate): Secondary | ICD-10-CM | POA: Insufficient documentation

## 2016-10-15 DIAGNOSIS — R197 Diarrhea, unspecified: Secondary | ICD-10-CM

## 2016-10-15 DIAGNOSIS — R109 Unspecified abdominal pain: Secondary | ICD-10-CM | POA: Diagnosis not present

## 2016-10-15 DIAGNOSIS — Z87891 Personal history of nicotine dependence: Secondary | ICD-10-CM | POA: Insufficient documentation

## 2016-10-15 DIAGNOSIS — Z7982 Long term (current) use of aspirin: Secondary | ICD-10-CM | POA: Diagnosis not present

## 2016-10-15 DIAGNOSIS — I130 Hypertensive heart and chronic kidney disease with heart failure and stage 1 through stage 4 chronic kidney disease, or unspecified chronic kidney disease: Secondary | ICD-10-CM | POA: Diagnosis not present

## 2016-10-15 DIAGNOSIS — R0789 Other chest pain: Secondary | ICD-10-CM | POA: Insufficient documentation

## 2016-10-15 DIAGNOSIS — M549 Dorsalgia, unspecified: Secondary | ICD-10-CM | POA: Diagnosis not present

## 2016-10-15 DIAGNOSIS — R1084 Generalized abdominal pain: Secondary | ICD-10-CM | POA: Diagnosis not present

## 2016-10-16 ENCOUNTER — Emergency Department: Payer: Medicare HMO

## 2016-10-16 ENCOUNTER — Encounter: Payer: Self-pay | Admitting: Emergency Medicine

## 2016-10-16 DIAGNOSIS — R079 Chest pain, unspecified: Secondary | ICD-10-CM | POA: Diagnosis not present

## 2016-10-16 LAB — BASIC METABOLIC PANEL
ANION GAP: 8 (ref 5–15)
BUN: 26 mg/dL — ABNORMAL HIGH (ref 6–20)
CO2: 26 mmol/L (ref 22–32)
Calcium: 9.1 mg/dL (ref 8.9–10.3)
Chloride: 109 mmol/L (ref 101–111)
Creatinine, Ser: 1.46 mg/dL — ABNORMAL HIGH (ref 0.61–1.24)
GFR calc Af Amer: 56 mL/min — ABNORMAL LOW (ref >60)
GFR, EST NON AFRICAN AMERICAN: 48 mL/min — AB (ref >60)
Glucose, Bld: 131 mg/dL — ABNORMAL HIGH (ref 65–99)
POTASSIUM: 4.7 mmol/L (ref 3.5–5.1)
SODIUM: 143 mmol/L (ref 135–145)

## 2016-10-16 LAB — HEPATIC FUNCTION PANEL
ALBUMIN: 4.1 g/dL (ref 3.5–5.0)
ALK PHOS: 59 U/L (ref 38–126)
ALT: 19 U/L (ref 17–63)
AST: 23 U/L (ref 15–41)
BILIRUBIN TOTAL: 0.6 mg/dL (ref 0.3–1.2)
Bilirubin, Direct: <0.1 mg/dL — ABNORMAL LOW (ref 0.1–0.5)
TOTAL PROTEIN: 7.5 g/dL (ref 6.5–8.1)

## 2016-10-16 LAB — CBC
HEMATOCRIT: 35.9 % — AB (ref 40.0–52.0)
HEMOGLOBIN: 11.7 g/dL — AB (ref 13.0–18.0)
MCH: 24.9 pg — ABNORMAL LOW (ref 26.0–34.0)
MCHC: 32.6 g/dL (ref 32.0–36.0)
MCV: 76.2 fL — ABNORMAL LOW (ref 80.0–100.0)
Platelets: 107 10*3/uL — ABNORMAL LOW (ref 150–440)
RBC: 4.72 MIL/uL (ref 4.40–5.90)
RDW: 18.3 % — ABNORMAL HIGH (ref 11.5–14.5)
WBC: 7.1 10*3/uL (ref 3.8–10.6)

## 2016-10-16 LAB — LIPASE, BLOOD: LIPASE: 38 U/L (ref 11–51)

## 2016-10-16 LAB — TROPONIN I
Troponin I: <0.03 ng/mL (ref <0.03)
Troponin I: <0.03 ng/mL (ref <0.03)

## 2016-10-16 MED ORDER — SODIUM CHLORIDE 0.9 % IV BOLUS (SEPSIS)
1000.0000 mL | Freq: Once | INTRAVENOUS | Status: AC
  Administered 2016-10-16: 1000 mL via INTRAVENOUS

## 2016-10-16 MED ORDER — ONDANSETRON HCL 4 MG/2ML IJ SOLN
4.0000 mg | Freq: Once | INTRAMUSCULAR | Status: AC
  Administered 2016-10-16: 4 mg via INTRAVENOUS
  Filled 2016-10-16: qty 2

## 2016-10-16 MED ORDER — IOPAMIDOL (ISOVUE-370) INJECTION 76%
100.0000 mL | Freq: Once | INTRAVENOUS | Status: AC | PRN
  Administered 2016-10-16: 100 mL via INTRAVENOUS

## 2016-10-16 MED ORDER — MORPHINE SULFATE (PF) 4 MG/ML IV SOLN
4.0000 mg | Freq: Once | INTRAVENOUS | Status: AC
  Administered 2016-10-16: 4 mg via INTRAVENOUS
  Filled 2016-10-16: qty 1

## 2016-10-16 NOTE — ED Notes (Signed)
Patient transported to CT 

## 2016-10-16 NOTE — ED Triage Notes (Signed)
Patient comes in from home via [facility] with chest pain/abdominal pain that radiates to the back and shoulder blades. Per EMS when they arrived patient was grey in color and was shivering. Patient states he got cold and was having uncontrollable shaking. Patients abdominal is distended and tender with palpation.

## 2016-10-16 NOTE — ED Notes (Signed)
Patient given ice chips per EDP ok. Patient had another episode of explosive diarrhea. Patient states he ate a sub from [employer] today and it had mayo on it and left it in the hot car for a while prior to eating it

## 2016-10-16 NOTE — ED Notes (Signed)
Patient up and ambulatory to bathroom. Patient is steady on feet.

## 2016-10-16 NOTE — ED Notes (Signed)
Patient went to the bathroom, had a bowel movement. Patient having diarrhea. Patient states he took MOM prior to arrival. Patient states after the BM his abdomin feels a little better.
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
## 2016-10-16 NOTE — ED Notes (Signed)
Patient verbalizes understanding of d/c instructions and follow-up. VS stable and pain controlled per patient.  Patient in NAD at time of d/c and denies further concerns regarding this visit. Patient stable at the time of departure from the unit, departing unit by the safest and most appropriate manner per that patients condition and limitations. Patient advised to return to the ED at any time for emergent concerns, or for new/worsening symptoms.   Pt refused wheel chair out. Patient states he can pay for a cab. [employer] called to pick up patient.

## 2016-10-16 NOTE — Discharge Instructions (Signed)
BRAT diet 3 days, then slowly advance diet as tolerated. Drink plenty of fluids daily. Return to the ER for worsening symptoms, persistent vomiting, difficulty bring or other concerns.

## 2016-10-27 ENCOUNTER — Encounter: Payer: Self-pay | Admitting: Radiology

## 2016-10-27 ENCOUNTER — Emergency Department: Payer: Medicare HMO

## 2016-10-27 ENCOUNTER — Inpatient Hospital Stay
Admission: EM | Admit: 2016-10-27 | Discharge: 2016-10-30 | DRG: 389 | Disposition: A | Discharge status: Home / Self Care | Payer: Medicare HMO | Attending: Surgery | Admitting: Surgery

## 2016-10-27 DIAGNOSIS — I959 Hypotension, unspecified: Secondary | ICD-10-CM | POA: Diagnosis present

## 2016-10-27 DIAGNOSIS — I255 Ischemic cardiomyopathy: Secondary | ICD-10-CM | POA: Diagnosis present

## 2016-10-27 DIAGNOSIS — R000 Tachycardia, unspecified: Secondary | ICD-10-CM | POA: Diagnosis not present

## 2016-10-27 DIAGNOSIS — R197 Diarrhea, unspecified: Secondary | ICD-10-CM | POA: Diagnosis not present

## 2016-10-27 DIAGNOSIS — Z7982 Long term (current) use of aspirin: Secondary | ICD-10-CM

## 2016-10-27 DIAGNOSIS — K56609 Unspecified intestinal obstruction, unspecified as to partial versus complete obstruction: Secondary | ICD-10-CM | POA: Diagnosis present

## 2016-10-27 DIAGNOSIS — R1030 Lower abdominal pain, unspecified: Secondary | ICD-10-CM | POA: Diagnosis not present

## 2016-10-27 DIAGNOSIS — A419 Sepsis, unspecified organism: Secondary | ICD-10-CM | POA: Diagnosis not present

## 2016-10-27 DIAGNOSIS — N183 Chronic kidney disease, stage 3 (moderate): Secondary | ICD-10-CM | POA: Diagnosis not present

## 2016-10-27 DIAGNOSIS — E872 Acidosis: Secondary | ICD-10-CM | POA: Diagnosis present

## 2016-10-27 DIAGNOSIS — Z7984 Long term (current) use of oral hypoglycemic drugs: Secondary | ICD-10-CM | POA: Diagnosis not present

## 2016-10-27 DIAGNOSIS — Z794 Long term (current) use of insulin: Secondary | ICD-10-CM

## 2016-10-27 DIAGNOSIS — G4733 Obstructive sleep apnea (adult) (pediatric): Secondary | ICD-10-CM | POA: Diagnosis present

## 2016-10-27 DIAGNOSIS — I252 Old myocardial infarction: Secondary | ICD-10-CM | POA: Diagnosis not present

## 2016-10-27 DIAGNOSIS — Z807 Family history of other malignant neoplasms of lymphoid, hematopoietic and related tissues: Secondary | ICD-10-CM

## 2016-10-27 DIAGNOSIS — Z9889 Other specified postprocedural states: Secondary | ICD-10-CM | POA: Diagnosis not present

## 2016-10-27 DIAGNOSIS — R079 Chest pain, unspecified: Secondary | ICD-10-CM | POA: Diagnosis not present

## 2016-10-27 DIAGNOSIS — E1122 Type 2 diabetes mellitus with diabetic chronic kidney disease: Secondary | ICD-10-CM | POA: Diagnosis present

## 2016-10-27 DIAGNOSIS — E11649 Type 2 diabetes mellitus with hypoglycemia without coma: Secondary | ICD-10-CM | POA: Diagnosis present

## 2016-10-27 DIAGNOSIS — R112 Nausea with vomiting, unspecified: Secondary | ICD-10-CM | POA: Diagnosis not present

## 2016-10-27 DIAGNOSIS — N189 Chronic kidney disease, unspecified: Secondary | ICD-10-CM | POA: Diagnosis not present

## 2016-10-27 DIAGNOSIS — R1110 Vomiting, unspecified: Secondary | ICD-10-CM | POA: Diagnosis not present

## 2016-10-27 DIAGNOSIS — I2510 Atherosclerotic heart disease of native coronary artery without angina pectoris: Secondary | ICD-10-CM | POA: Diagnosis not present

## 2016-10-27 DIAGNOSIS — K56600 Partial intestinal obstruction, unspecified as to cause: Secondary | ICD-10-CM | POA: Diagnosis not present

## 2016-10-27 DIAGNOSIS — I129 Hypertensive chronic kidney disease with stage 1 through stage 4 chronic kidney disease, or unspecified chronic kidney disease: Secondary | ICD-10-CM | POA: Diagnosis present

## 2016-10-27 DIAGNOSIS — Z955 Presence of coronary angioplasty implant and graft: Secondary | ICD-10-CM | POA: Diagnosis not present

## 2016-10-27 DIAGNOSIS — Z9049 Acquired absence of other specified parts of digestive tract: Secondary | ICD-10-CM

## 2016-10-27 DIAGNOSIS — Z79899 Other long term (current) drug therapy: Secondary | ICD-10-CM | POA: Diagnosis not present

## 2016-10-27 DIAGNOSIS — H9190 Unspecified hearing loss, unspecified ear: Secondary | ICD-10-CM | POA: Diagnosis not present

## 2016-10-27 DIAGNOSIS — E860 Dehydration: Secondary | ICD-10-CM | POA: Diagnosis present

## 2016-10-27 DIAGNOSIS — Z87891 Personal history of nicotine dependence: Secondary | ICD-10-CM

## 2016-10-27 DIAGNOSIS — R109 Unspecified abdominal pain: Secondary | ICD-10-CM | POA: Diagnosis not present

## 2016-10-27 DIAGNOSIS — I10 Essential (primary) hypertension: Secondary | ICD-10-CM | POA: Diagnosis not present

## 2016-10-27 DIAGNOSIS — E119 Type 2 diabetes mellitus without complications: Secondary | ICD-10-CM | POA: Diagnosis not present

## 2016-10-27 DIAGNOSIS — Z8249 Family history of ischemic heart disease and other diseases of the circulatory system: Secondary | ICD-10-CM

## 2016-10-27 DIAGNOSIS — Z4682 Encounter for fitting and adjustment of non-vascular catheter: Secondary | ICD-10-CM | POA: Diagnosis not present

## 2016-10-27 DIAGNOSIS — R0602 Shortness of breath: Secondary | ICD-10-CM | POA: Diagnosis not present

## 2016-10-27 LAB — COMPREHENSIVE METABOLIC PANEL
ALT: 17 U/L (ref 17–63)
AST: 28 U/L (ref 15–41)
Albumin: 3.7 g/dL (ref 3.5–5.0)
Alkaline Phosphatase: 54 U/L (ref 38–126)
Anion gap: 10 (ref 5–15)
BUN: 31 mg/dL — AB (ref 6–20)
CHLORIDE: 100 mmol/L — AB (ref 101–111)
CO2: 26 mmol/L (ref 22–32)
CREATININE: 1.6 mg/dL — AB (ref 0.61–1.24)
Calcium: 8.6 mg/dL — ABNORMAL LOW (ref 8.9–10.3)
GFR calc Af Amer: 50 mL/min — ABNORMAL LOW (ref >60)
GFR calc non Af Amer: 43 mL/min — ABNORMAL LOW (ref >60)
Glucose, Bld: 162 mg/dL — ABNORMAL HIGH (ref 65–99)
POTASSIUM: 4.4 mmol/L (ref 3.5–5.1)
SODIUM: 136 mmol/L (ref 135–145)
TOTAL PROTEIN: 7.2 g/dL (ref 6.5–8.1)
Total Bilirubin: 1.1 mg/dL (ref 0.3–1.2)

## 2016-10-27 LAB — GLUCOSE, CAPILLARY
GLUCOSE-CAPILLARY: 77 mg/dL (ref 65–99)
GLUCOSE-CAPILLARY: 81 mg/dL (ref 65–99)
Glucose-Capillary: 54 mg/dL — ABNORMAL LOW (ref 65–99)
Glucose-Capillary: 53 mg/dL — ABNORMAL LOW (ref 65–99)
Glucose-Capillary: 62 mg/dL — ABNORMAL LOW (ref 65–99)

## 2016-10-27 LAB — CBC
HEMATOCRIT: 33.7 % — AB (ref 40.0–52.0)
Hemoglobin: 11.1 g/dL — ABNORMAL LOW (ref 13.0–18.0)
MCH: 25.4 pg — ABNORMAL LOW (ref 26.0–34.0)
MCHC: 33 g/dL (ref 32.0–36.0)
MCV: 77.1 fL — AB (ref 80.0–100.0)
PLATELETS: 121 10*3/uL — AB (ref 150–440)
RBC: 4.37 MIL/uL — AB (ref 4.40–5.90)
RDW: 18.3 % — AB (ref 11.5–14.5)
WBC: 2.8 10*3/uL — AB (ref 3.8–10.6)

## 2016-10-27 LAB — URINALYSIS, COMPLETE (UACMP) WITH MICROSCOPIC
BILIRUBIN URINE: NEGATIVE
Bacteria, UA: NONE SEEN
GLUCOSE, UA: NEGATIVE mg/dL
Hgb urine dipstick: NEGATIVE
KETONES UR: NEGATIVE mg/dL
LEUKOCYTES UA: NEGATIVE
NITRITE: NEGATIVE
PH: 6 (ref 5.0–8.0)
PROTEIN: NEGATIVE mg/dL
Specific Gravity, Urine: 1.039 — ABNORMAL HIGH (ref 1.005–1.030)
Squamous Epithelial / LPF: NONE SEEN

## 2016-10-27 LAB — LACTIC ACID, PLASMA
Lactic Acid, Venous: 3 mmol/L (ref 0.5–1.9)
Lactic Acid, Venous: 1.2 mmol/L (ref 0.5–1.9)

## 2016-10-27 LAB — TROPONIN I: Troponin I: <0.03 ng/mL (ref <0.03)

## 2016-10-27 LAB — LIPASE, BLOOD: LIPASE: 24 U/L (ref 11–51)

## 2016-10-27 MED ORDER — ONDANSETRON 4 MG PO TBDP: 4.0000 mg | ORAL_TABLET | Freq: Four times a day (QID) | ORAL | Status: DC | PRN

## 2016-10-27 MED ORDER — SODIUM CHLORIDE 0.9 % IV BOLUS (SEPSIS)
500.0000 mL | Freq: Once | INTRAVENOUS | Status: AC
  Administered 2016-10-27: 500 mL via INTRAVENOUS

## 2016-10-27 MED ORDER — LACTATED RINGERS IV SOLN
INTRAVENOUS | Status: DC
  Administered 2016-10-27: 18:00:00 via INTRAVENOUS

## 2016-10-27 MED ORDER — ACETAMINOPHEN 500 MG PO TABS
ORAL_TABLET | ORAL | Status: AC
  Filled 2016-10-27: qty 2

## 2016-10-27 MED ORDER — DEXTROSE 50 % IV SOLN
INTRAVENOUS | Status: AC
  Administered 2016-10-27: 25 mL
  Filled 2016-10-27: qty 50

## 2016-10-27 MED ORDER — INSULIN ASPART 100 UNIT/ML ~~LOC~~ SOLN
0.0000 [IU] | SUBCUTANEOUS | Status: DC
  Administered 2016-10-29: 3 [IU] via SUBCUTANEOUS
  Administered 2016-10-29: 4 [IU] via SUBCUTANEOUS
  Administered 2016-10-29 – 2016-10-30 (×3): 3 [IU] via SUBCUTANEOUS
  Administered 2016-10-30: 4 [IU] via SUBCUTANEOUS
  Filled 2016-10-27 (×7): qty 1

## 2016-10-27 MED ORDER — SODIUM CHLORIDE 0.9 % IV BOLUS (SEPSIS)
1000.0000 mL | Freq: Once | INTRAVENOUS | Status: AC
  Administered 2016-10-27: 1000 mL via INTRAVENOUS

## 2016-10-27 MED ORDER — PIPERACILLIN-TAZOBACTAM 3.375 G IVPB 30 MIN
INTRAVENOUS | Status: AC
  Administered 2016-10-27: 3.375 g via INTRAVENOUS
  Filled 2016-10-27: qty 50

## 2016-10-27 MED ORDER — LACTATED RINGERS IV SOLN
125.0000 mL/h | INTRAVENOUS | Status: DC
  Administered 2016-10-27: 125 mL/h via INTRAVENOUS

## 2016-10-27 MED ORDER — ACETAMINOPHEN 500 MG PO TABS
1000.0000 mg | ORAL_TABLET | Freq: Once | ORAL | Status: AC
  Administered 2016-10-27: 1000 mg via ORAL

## 2016-10-27 MED ORDER — DEXTROSE-NACL 5-0.45 % IV SOLN
INTRAVENOUS | Status: DC
  Administered 2016-10-27 – 2016-10-30 (×6): via INTRAVENOUS

## 2016-10-27 MED ORDER — PIPERACILLIN-TAZOBACTAM 3.375 G IVPB 30 MIN
3.3750 g | Freq: Once | INTRAVENOUS | Status: AC
  Administered 2016-10-27: 3.375 g via INTRAVENOUS

## 2016-10-27 MED ORDER — IOPAMIDOL (ISOVUE-300) INJECTION 61%
30.0000 mL | Freq: Once | INTRAVENOUS | Status: AC | PRN
  Administered 2016-10-27: 30 mL via ORAL

## 2016-10-27 MED ORDER — VANCOMYCIN HCL IN DEXTROSE 1-5 GM/200ML-% IV SOLN
1000.0000 mg | Freq: Once | INTRAVENOUS | Status: AC
  Administered 2016-10-27: 1000 mg via INTRAVENOUS
  Filled 2016-10-27: qty 200

## 2016-10-27 MED ORDER — PANTOPRAZOLE SODIUM 40 MG IV SOLR
40.0000 mg | Freq: Every day | INTRAVENOUS | Status: DC
  Administered 2016-10-27 – 2016-10-29 (×3): 40 mg via INTRAVENOUS
  Filled 2016-10-27 (×3): qty 40

## 2016-10-27 MED ORDER — HYDRALAZINE HCL 20 MG/ML IJ SOLN: 10.0000 mg | Freq: Four times a day (QID) | INTRAMUSCULAR | Status: DC | PRN

## 2016-10-27 MED ORDER — HYDROMORPHONE HCL 1 MG/ML IJ SOLN
0.5000 mg | INTRAMUSCULAR | Status: DC | PRN
  Administered 2016-10-28 – 2016-10-30 (×4): 0.5 mg via INTRAVENOUS
  Filled 2016-10-27 (×5): qty 0.5

## 2016-10-27 MED ORDER — IOPAMIDOL (ISOVUE-300) INJECTION 61%
80.0000 mL | Freq: Once | INTRAVENOUS | Status: AC | PRN
  Administered 2016-10-27: 80 mL via INTRAVENOUS

## 2016-10-27 MED ORDER — METOPROLOL TARTRATE 5 MG/5ML IV SOLN
5.0000 mg | Freq: Four times a day (QID) | INTRAVENOUS | Status: DC
  Administered 2016-10-27 – 2016-10-30 (×11): 5 mg via INTRAVENOUS
  Filled 2016-10-27 (×11): qty 5

## 2016-10-27 MED ORDER — HEPARIN SODIUM (PORCINE) 5000 UNIT/ML IJ SOLN
5000.0000 [IU] | Freq: Three times a day (TID) | INTRAMUSCULAR | Status: DC
  Administered 2016-10-27 – 2016-10-30 (×8): 5000 [IU] via SUBCUTANEOUS
  Filled 2016-10-27 (×8): qty 1

## 2016-10-27 MED ORDER — FENTANYL CITRATE (PF) 100 MCG/2ML IJ SOLN
50.0000 ug | Freq: Once | INTRAMUSCULAR | Status: AC
  Administered 2016-10-27: 50 ug via INTRAVENOUS
  Filled 2016-10-27: qty 2

## 2016-10-27 MED ORDER — ONDANSETRON HCL 4 MG/2ML IJ SOLN
4.0000 mg | Freq: Four times a day (QID) | INTRAMUSCULAR | Status: DC | PRN
Start: 2016-10-27 — End: 2016-10-30

## 2016-10-27 NOTE — Progress Notes (Signed)
Prime doctor notified of  pt.'s CBG 53 at [place], pt is asymptomatic. No orders to give dextrose IV at this time. New orders to change continuous fluids to D5% -0.45% NS at 45ml/hr. Pt.'s CBG will be checked at 2030. Will relay the message to oncoming RN.   Marykathleen Russi [employer]

## 2016-10-27 NOTE — ED Notes (Signed)
Pt resting in bed. No change in patient condition. Lights dimmed for patient comfort. Will continue to monitor for further patient needs.

## 2016-10-27 NOTE — ED Notes (Signed)
New cannister placed and patient placed back on low-intermittent suction.

## 2016-10-27 NOTE — ED Notes (Signed)
1L obtained from NG tube at this time, placed on low intermittent suction after confirming placement with auscultation. Stomach contents noted to be green in nature. Suction turned off. Will continue to monitor output.

## 2016-10-27 NOTE — ED Notes (Signed)
Date and time results received: 10/27/16 8:46 AM   Test: Lactic  Critical Value: 3.0  Name of Provider Notified: Dr. Cherylann Banas  Orders Received? Or Actions Taken?: Critical results acknowledged

## 2016-10-27 NOTE — Progress Notes (Signed)
Cpap as ordered is in the room but pt does not wish to use it at this time.

## 2016-10-27 NOTE — Progress Notes (Signed)
Hypoglycemic Event  CBG: 54 at 1620  Treatment: 25 ml of dextrose solution given at 1638  Symptoms: confusion, sweating, "i feel weird"  Follow-up CBG: Time: 77 CBG Result: 77  Possible Reasons for Event: pt is NPO, took morning insulin at home   Comments/MD notified: MD notified, no new orders given at this time other than follow hypoglycemia protocol   RN will continue to monitor pt.     Todd Spencer [employer]

## 2016-10-27 NOTE — ED Notes (Signed)
Pt resting in bed with eyes closed. Suction turned back on at this time. Will continue to monitor for further patient needs.

## 2016-10-27 NOTE — ED Notes (Signed)
New cannister placed. 900cc's removed via NG tube at this time. Pt states his stomach is feeling better at this time.

## 2016-10-27 NOTE — ED Notes (Signed)
Dr. Hampton Abbot at bedside at this time. Pt remains hooked up to low intermittent suction to his NG tube.
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
## 2016-10-27 NOTE — ED Triage Notes (Signed)
Patient comes in via [facility] with complaints of abdominal pain, nausea, and vomiting since Thursday. Patient alert and oriented x4, hemodynamically stable, febrile, tachycardic, and hypoxic.  Oxygen increased to 3L nasal cannula and MD notified.
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
## 2016-10-27 NOTE — ED Notes (Signed)
Pt returned from CT °

## 2016-10-27 NOTE — ED Notes (Signed)
Pt taken to CT at this time.

## 2016-10-28 LAB — GLUCOSE, CAPILLARY
GLUCOSE-CAPILLARY: 78 mg/dL (ref 65–99)
GLUCOSE-CAPILLARY: 70 mg/dL (ref 65–99)
GLUCOSE-CAPILLARY: 109 mg/dL — AB (ref 65–99)
GLUCOSE-CAPILLARY: 67 mg/dL (ref 65–99)
GLUCOSE-CAPILLARY: 84 mg/dL (ref 65–99)
Glucose-Capillary: 72 mg/dL (ref 65–99)
Glucose-Capillary: 75 mg/dL (ref 65–99)
Glucose-Capillary: 77 mg/dL (ref 65–99)
Glucose-Capillary: 84 mg/dL (ref 65–99)

## 2016-10-28 LAB — BASIC METABOLIC PANEL
Anion gap: 6 (ref 5–15)
BUN: 23 mg/dL — AB (ref 6–20)
CHLORIDE: 106 mmol/L (ref 101–111)
CO2: 24 mmol/L (ref 22–32)
CREATININE: 1.45 mg/dL — AB (ref 0.61–1.24)
Calcium: 8.4 mg/dL — ABNORMAL LOW (ref 8.9–10.3)
GFR calc Af Amer: 56 mL/min — ABNORMAL LOW (ref >60)
GFR calc non Af Amer: 48 mL/min — ABNORMAL LOW (ref >60)
GLUCOSE: 80 mg/dL (ref 65–99)
POTASSIUM: 4.3 mmol/L (ref 3.5–5.1)
Sodium: 136 mmol/L (ref 135–145)

## 2016-10-28 LAB — CBC WITH DIFFERENTIAL/PLATELET
Basophils Absolute: 0 10*3/uL (ref 0–0.1)
Basophils Relative: 0 %
EOS PCT: 3 %
Eosinophils Absolute: 0.2 10*3/uL (ref 0–0.7)
HEMATOCRIT: 31.3 % — AB (ref 40.0–52.0)
Hemoglobin: 10.4 g/dL — ABNORMAL LOW (ref 13.0–18.0)
LYMPHS ABS: 0.9 10*3/uL — AB (ref 1.0–3.6)
LYMPHS PCT: 14 %
MCH: 25.3 pg — AB (ref 26.0–34.0)
MCHC: 33.1 g/dL (ref 32.0–36.0)
MCV: 76.4 fL — AB (ref 80.0–100.0)
MONO ABS: 0.9 10*3/uL (ref 0.2–1.0)
Monocytes Relative: 15 %
NEUTROS ABS: 4.3 10*3/uL (ref 1.4–6.5)
Neutrophils Relative %: 68 %
PLATELETS: 106 10*3/uL — AB (ref 150–440)
RBC: 4.1 MIL/uL — ABNORMAL LOW (ref 4.40–5.90)
RDW: 18.2 % — AB (ref 11.5–14.5)
WBC: 6.4 10*3/uL (ref 3.8–10.6)

## 2016-10-28 LAB — MAGNESIUM: Magnesium: 2.2 mg/dL (ref 1.7–2.4)

## 2016-10-28 MED ORDER — PREGABALIN 75 MG PO CAPS
300.0000 mg | ORAL_CAPSULE | Freq: Two times a day (BID) | ORAL | Status: DC
  Administered 2016-10-28 – 2016-10-30 (×5): 300 mg via ORAL
  Filled 2016-10-28 (×5): qty 4

## 2016-10-28 MED ORDER — ACETAMINOPHEN 500 MG PO TABS
1000.0000 mg | ORAL_TABLET | Freq: Four times a day (QID) | ORAL | Status: DC | PRN
  Administered 2016-10-28 (×2): 1000 mg via ORAL
  Filled 2016-10-28 (×2): qty 2

## 2016-10-28 MED ORDER — IPRATROPIUM-ALBUTEROL 0.5-2.5 (3) MG/3ML IN SOLN
3.0000 mL | Freq: Four times a day (QID) | RESPIRATORY_TRACT | Status: DC | PRN
  Administered 2016-10-28: 3 mL via RESPIRATORY_TRACT
  Filled 2016-10-28: qty 3

## 2016-10-28 MED ORDER — NITROGLYCERIN 0.4 MG SL SUBL: 0.4000 mg | SUBLINGUAL_TABLET | SUBLINGUAL | Status: DC | PRN

## 2016-10-28 MED ORDER — DEXTROSE 50 % IV SOLN
25.0000 mL | Freq: Once | INTRAVENOUS | Status: AC
  Administered 2016-10-28: 25 mL via INTRAVENOUS

## 2016-10-28 MED ORDER — DONEPEZIL HCL 5 MG PO TABS
10.0000 mg | ORAL_TABLET | Freq: Every day | ORAL | Status: DC
Start: 2016-10-28 — End: 2016-10-30
  Administered 2016-10-28 – 2016-10-29 (×2): 10 mg via ORAL
  Filled 2016-10-28 (×2): qty 1
  Filled 2016-10-28: qty 2

## 2016-10-28 NOTE — Progress Notes (Signed)
Patient having expiratory wheezes and increased shortness of breath. o2 saturations assessed and 94%on 2L. MD notified, PRN nebulizers ordered. Will give and continue to monitor.

## 2016-10-28 NOTE — Progress Notes (Signed)
[place] [facility] [place] NAME: Todd Spencer    MR#:  [id number]  DATE OF BIRTH:  1950-06-25  SUBJECTIVE:   Patient here due to abdominal pain and noted to have a partial small bowel obstruction. Status post NG tube placement. Patient continues to have some bilious drainage from his NG tube. Still has some abdominal pain, hospitalists were consulted for medical management. Blood sugar improved   REVIEW OF SYSTEMS:    Review of Systems  Constitutional: Negative for chills and fever.  HENT: Negative for congestion and tinnitus.   Eyes: Negative for blurred vision and double vision.  Respiratory: Negative for cough, shortness of breath and wheezing.   Cardiovascular: Negative for chest pain, orthopnea and PND.  Gastrointestinal: Positive for abdominal pain. Negative for diarrhea, nausea and vomiting.  Genitourinary: Negative for dysuria and hematuria.  Neurological: Negative for dizziness, sensory change and focal weakness.  All other systems reviewed and are negative.   Nutrition: NPO Tolerating Diet: No Tolerating PT: Await Eval.   DRUG ALLERGIES:  No Known Allergies  VITALS:  Blood pressure (!) 109/58, pulse 85, temperature 98.6 F (37 C), temperature source Oral, resp. rate 16, height 5\' 10"  (1.778 m), weight 106.6 kg (235 lb), SpO2 98 %.  PHYSICAL EXAMINATION:   Physical Exam  GENERAL:  66 y.o.-year-old obese patient lying in bed in no acute distress.  EYES: Pupils equal, round, reactive to light and accommodation. No scleral icterus. Extraocular muscles intact.  HEENT: Head atraumatic, normocephalic. Oropharynx and nasopharynx clear. NG tube in place w/ Bilious drainage noted.  NECK:  Supple, no jugular venous distention. No thyroid enlargement, no tenderness.  LUNGS: Normal breath sounds bilaterally, no wheezing, rales, rhonchi. No use of accessory muscles of respiration.  CARDIOVASCULAR: S1, S2 normal. No murmurs, rubs, or  gallops.  ABDOMEN: Soft, nontender, nondistended. Bowel sounds present. No organomegaly or mass.  EXTREMITIES: No cyanosis, clubbing or edema b/l.    NEUROLOGIC: Cranial nerves II through XII are intact. No focal Motor or sensory deficits b/l.   PSYCHIATRIC: The patient is alert and oriented x 3.  SKIN: No obvious rash, lesion, or ulcer.    LABORATORY PANEL:   CBC  Recent Labs Lab 10/28/16 0344  WBC 6.4  HGB 10.4*  HCT 31.3*  PLT 106*   ------------------------------------------------------------------------------------------------------------------  Chemistries   Recent Labs Lab 10/27/16 0747 10/28/16 0344  NA 136 136  K 4.4 4.3  CL 100* 106  CO2 26 24  GLUCOSE 162* 80  BUN 31* 23*  CREATININE 1.60* 1.45*  CALCIUM 8.6* 8.4*  MG  --  2.2  AST 28  --   ALT 17  --   ALKPHOS 54  --   BILITOT 1.1  --    ------------------------------------------------------------------------------------------------------------------  Cardiac Enzymes  Recent Labs Lab 10/27/16 0752  TROPONINI <0.03   ------------------------------------------------------------------------------------------------------------------  RADIOLOGY:  Dg Abdomen 1 View  Result Date: 10/27/2016 CLINICAL DATA:  Nasogastric tube placement. EXAM: ABDOMEN - 1 VIEW COMPARISON:  CT scan of same day.  Radiograph of December 12, 2015. FINDINGS: Distal tip of nasogastric tube appears to be in the proximal stomach. Mildly dilated small bowel loops are noted concerning for distal small bowel obstruction. Status post cholecystectomy. IMPRESSION: Distal tip of nasogastric tube appears to be in proximal stomach. Mildly dilated small bowel loops are noted concerning for distal small bowel obstruction. Electronically Signed   By: Marijo Conception, M.D.   On: 10/27/2016 11:43   Ct Abdomen Pelvis  W Contrast  Result Date: 10/27/2016 CLINICAL DATA:  Abdominal pain, nausea and vomiting. EXAM: CT ABDOMEN AND PELVIS WITH CONTRAST  TECHNIQUE: Multidetector CT imaging of the abdomen and pelvis was performed using the standard protocol following bolus administration of intravenous contrast. CONTRAST:  28mL ISOVUE-300 IOPAMIDOL (ISOVUE-300) INJECTION 61% COMPARISON:  CT abdomen dated 12/12/2015. FINDINGS: Lower chest: Patchy consolidation within the right lower lobe, incompletely imaged, most likely atelectasis. Nodular component is seen within the medial aspects of the right lower lobe (series 4, image 7) measuring 14 mm. Hepatobiliary: No focal liver abnormality is seen. Status post cholecystectomy. No biliary dilatation. Pancreas: Unremarkable. No pancreatic ductal dilatation or surrounding inflammatory changes. Spleen: Normal in size without focal abnormality. Adrenals/Urinary Tract: Adrenal glands appear normal. Stable hypodense mass exophytic to the upper pole of the right kidney, measuring 2.7 cm, with CT density measurement of 30 Hounsfield units. Kidneys otherwise unremarkable without stone or hydronephrosis. No ureteral or bladder calculi identified. Bladder appears normal. Stomach/Bowel: Stomach and small bowel are moderately distended, fluid-filled, indicating some degree of small well obstruction. Transition zone is not clearly seen but is most likely in the right lower quadrant (series 2, images 75 and 76). No obstructing mass identified. Large bowel is relatively decompressed throughout. Appendix is normal. Scattered mild diverticulosis within the descending and sigmoid colon without evidence of acute diverticulitis. Vascular/Lymphatic: Aortic atherosclerosis. No enlarged abdominal or pelvic lymph nodes. Reproductive: Prostate gland is mildly prominent in size. Other: No free fluid or abscess collections seen. No free intraperitoneal air. Musculoskeletal: Mild degenerative change within the lumbar spine. No acute or suspicious osseous finding. IMPRESSION: 1. Small-bowel obstruction. Favor partial small bowel obstruction. Transition  zone is not clearly seen but most likely in the right lower quadrant. No obstructing mass identified (adhesions? ). No free fluid or abscess collection seen. No free intraperitoneal air. 2. Patchy consolidation within the right lower lung, incompletely imaged, most of which is suggestive of atelectasis. Pneumonia or aspiration pneumonitis cannot be excluded. A nodular component within the medial aspects of the right lower lobe measures 14 mm, neoplastic nodule not excluded. Consider one of the following in 3 months for both low-risk and high-risk individuals: (a) repeat chest CT, (b) follow-up PET-CT, or (c) tissue sampling. This recommendation follows the consensus statement: Guidelines for Management of Incidental Pulmonary Nodules Detected on CT Images: From the Fleischner Society 2017; Radiology 2017; 284:228-243. 3. **An incidental finding of potential clinical significance has been found. Rounded mass exophytic to the upper pole of the right kidney, stable in size compared to CT of 12/12/2015, but with a CT density measurement of 30 Hounsfield units which is not compatible with a simple cyst. CT density measurement is similar on both early post contrast images and delayed postcontrast images suggesting benign hemorrhagic cyst. Solid neoplastic mass is considered less likely. Per consensus guidelines, would consider further characterization with nonemergent renal protocol CT or renal MRI. At minimum, recommend follow-up CT in 6-12 months to ensure continued stability in size. ** 4. Mild colonic diverticulosis without evidence of acute diverticulitis. 5. Aortic atherosclerosis. Electronically Signed   By: Franki Cabot M.D.   On: 10/27/2016 10:36   Dg Chest Port 1 View  Result Date: 10/27/2016 CLINICAL DATA:  Chest pain, shortness of breath. EXAM: PORTABLE CHEST 1 VIEW COMPARISON:  Radiograph of October 16, 2016. FINDINGS: Stable cardiomediastinal silhouette. No pneumothorax or pleural effusion is noted. Bony  thorax is unremarkable. No acute pulmonary disease is noted. Hypoinflation of the lungs is noted. IMPRESSION: Hypoinflation  of the lungs. No other significant cardiopulmonary abnormality seen. Electronically Signed   By: Marijo Conception, M.D.   On: 10/27/2016 08:24     ASSESSMENT AND PLAN:   Todd Spencer  is a 66 y.o. male with a known history of Multiple medical problems including CAD status post stent several years ago, chronic angina, ischemic cardiomyopathy with last EF of 45%, CK D stage III, sleep apnea on CPAP at night times, hypertension, history of hiatal hernia presents to hospital secondary to worsening abdominal pain associated with nausea and vomiting.  #1 Insulin dependent diabetes mellitus- with hypoglycemia today - has been taking his Lantus and regular insulin even though unable to eat due to bowel obstruction.  - cont. D5 fluids and sugars are improving.   - Cont. To hold Lantus, cont. SSI.   #2 CAD-RCA stent several years ago, recent cardiac catheterization with chronic distal RCA occlusion, medical management recommended. - no acute symptoms.   -Hold aspirin at this time  #3 small bowel obstruction-partial obstruction, cont. management per surgery - cont. NG tube decompression, IV fluids, anti-emetics.  -Monitor carefully as EF reduced at 45%. Appears clinically dry at this time. -Remains nothing by mouth -Not on any antibiotics as lactic acid normalized with fluids and normal white count  #4 obstructive sleep apnea-has an NG tube, not sure if CPAP can be placed. -Continue oxygen supplementation at this time  #5 hypertension-since nothing by mouth - cont. IV Metoprolol, Hydralazine for now.  - BP stable.    All the records are reviewed and case discussed with Care Management/Social Worker. Management plans discussed with the patient, family and they are in agreement.  CODE STATUS: Full code  DVT Prophylaxis: Hep SQ  TOTAL TIME TAKING CARE OF THIS  PATIENT: 30 minutes.   POSSIBLE D/C  unclear, DEPENDING ON CLINICAL CONDITION.   Henreitta Leber M.D on 10/28/2016 at 2:22 PM  Between 7am to 6pm - Pager - [phone]  After 6pm go to [email or web address] - Proofreader  [facility]  Office  [phone]  CC: Primary care physician; [place] Pink, MD

## 2016-10-28 NOTE — Progress Notes (Signed)
Hypoglycemic Event  CBG: 67  Treatment: 65ml dextrose 50 IV  Symptoms: asymptomatic  Follow-up CBG: Time: CBG Result:109  Possible Reasons for Event: patient NPO  Comments/MD notified:hypoglecemia protocol followed. Patient asymptomatic at this time, will continue to assess for signs and symptoms of hypoglycemia.     Todd Spencer D Todd Spencer

## 2016-10-28 NOTE — Plan of Care (Signed)
Problem: Pain Managment: Goal: General experience of comfort will improve Outcome: Not Progressing Patient c/o headache, MD paged for tylenol orders, PO tylenol given. Will continue to assess and monitor.

## 2016-10-28 NOTE — Progress Notes (Signed)
10/28/2016  Subjective: Patient admitted yesterday with SBO with significant dehydration with lactic acidosis.  3L of fluid suction out of NG tube yesterday after placement, including 2 full canisters immediately after placing NG tube.  Patient feels better today.  Reports having some mild flatus overnight, but NG output still high overnight.  Denies any abdominal pain. Is asking for his Lyrica.  Vital signs: Temp:  [97.4 F (36.3 C)-100.1 F (37.8 C)] 97.4 F (36.3 C) (09/09 0818) Pulse Rate:  [78-126] 126 (09/09 0818) Resp:  [16-26] 16 (09/09 0818) BP: (89-161)/(50-83) 137/71 (09/09 0818) SpO2:  [94 %-100 %] 100 % (09/09 0818)   Intake/Output: 09/08 0701 - 09/09 0700 In: 5362.4 [I.V.:1772.4; NG/GT:40; IV Piggyback:3550] Out: 1655 [Urine:1850; Emesis/NG output:820] Last BM Date: 10/27/16  Physical Exam: Constitutional: No acute distress Abdomen:  Soft, mild distended, nontender to palpation.  NG tube with gastric contents in tubing.  Labs:   Recent Labs  10/27/16 0747 10/28/16 0344  WBC 2.8* 6.4  HGB 11.1* 10.4*  HCT 33.7* 31.3*  PLT 121* 106*    Recent Labs  10/27/16 0747 10/28/16 0344  NA 136 136  K 4.4 4.3  CL 100* 106  CO2 26 24  GLUCOSE 162* 80  BUN 31* 23*  CREATININE 1.60* 1.45*  CALCIUM 8.6* 8.4*   No results for input(s): LABPROT, INR in the last 72 hours.  Imaging: No results found.  Assessment/Plan: 66 yo male with SBO  --given high NG output, will continue NG tube today.  Possibly remove tomorrow if continues with bowel function and low output. --will start home lyrica today.  May clamp NG tube for meds. --labs improving with hydration and improvement in SBO.  Will continue to monitor. --OOB, ambulate as tolerated --appreciate medicine recs in management of his comorbidities.     Melvyn Neth, [place]

## 2016-10-29 LAB — GLUCOSE, CAPILLARY
GLUCOSE-CAPILLARY: 123 mg/dL — AB (ref 65–99)
GLUCOSE-CAPILLARY: 129 mg/dL — AB (ref 65–99)
GLUCOSE-CAPILLARY: 108 mg/dL — AB (ref 65–99)
Glucose-Capillary: 91 mg/dL (ref 65–99)
Glucose-Capillary: 197 mg/dL — ABNORMAL HIGH (ref 65–99)

## 2016-10-29 LAB — CBC
HEMATOCRIT: 32.4 % — AB (ref 40.0–52.0)
Hemoglobin: 10.8 g/dL — ABNORMAL LOW (ref 13.0–18.0)
MCH: 25.2 pg — AB (ref 26.0–34.0)
MCHC: 33.2 g/dL (ref 32.0–36.0)
MCV: 75.7 fL — AB (ref 80.0–100.0)
PLATELETS: 115 10*3/uL — AB (ref 150–440)
RBC: 4.28 MIL/uL — ABNORMAL LOW (ref 4.40–5.90)
RDW: 17.4 % — ABNORMAL HIGH (ref 11.5–14.5)
WBC: 6.9 10*3/uL (ref 3.8–10.6)

## 2016-10-29 LAB — BASIC METABOLIC PANEL
ANION GAP: 9 (ref 5–15)
BUN: 17 mg/dL (ref 6–20)
CALCIUM: 9.1 mg/dL (ref 8.9–10.3)
CO2: 25 mmol/L (ref 22–32)
Chloride: 103 mmol/L (ref 101–111)
Creatinine, Ser: 1.4 mg/dL — ABNORMAL HIGH (ref 0.61–1.24)
GFR, EST AFRICAN AMERICAN: 59 mL/min — AB (ref >60)
GFR, EST NON AFRICAN AMERICAN: 50 mL/min — AB (ref >60)
Glucose, Bld: 135 mg/dL — ABNORMAL HIGH (ref 65–99)
Potassium: 4.1 mmol/L (ref 3.5–5.1)
SODIUM: 137 mmol/L (ref 135–145)

## 2016-10-29 NOTE — Progress Notes (Signed)
[place] at [place] NAME: Todd Spencer    MR#:  [id number]  DATE OF BIRTH:  [birth year]  SUBJECTIVE:   Patient here due to abdominal pain and noted to have a partial small bowel obstruction. NG tube was clamped this morning and output has been low and plan to remove later today and started on clear liquids as per surgery. Patient still complaining of some mild abdominal pain but no other associated symptoms. Blood sugars have been stable.  REVIEW OF SYSTEMS:    Review of Systems  Constitutional: Negative for chills and fever.  HENT: Negative for congestion and tinnitus.   Eyes: Negative for blurred vision and double vision.  Respiratory: Negative for cough, shortness of breath and wheezing.   Cardiovascular: Negative for chest pain, orthopnea and PND.  Gastrointestinal: Positive for abdominal pain. Negative for diarrhea, nausea and vomiting.  Genitourinary: Negative for dysuria and hematuria.  Neurological: Negative for dizziness, sensory change and focal weakness.  All other systems reviewed and are negative.   Nutrition: NPO Tolerating Diet: No Tolerating PT: Await Eval.   DRUG ALLERGIES:  No Known Allergies  VITALS:  Blood pressure 130/68, pulse 72, temperature 98.9 F (37.2 C), temperature source Oral, resp. rate 18, height 5\' 10"  (1.778 m), weight 106.6 kg (235 lb), SpO2 94 %.  PHYSICAL EXAMINATION:   Physical Exam  GENERAL:  66 y.o.-year-old obese patient lying in bed in no acute distress.  EYES: Pupils equal, round, reactive to light and accommodation. No scleral icterus. Extraocular muscles intact.  HEENT: Head atraumatic, normocephalic. Oropharynx and nasopharynx clear. NG tube in place which is clamped. NECK:  Supple, no jugular venous distention. No thyroid enlargement, no tenderness.  LUNGS: Normal breath sounds bilaterally, no wheezing, rales, rhonchi. No use of accessory muscles of respiration.  CARDIOVASCULAR: S1, S2  normal. No murmurs, rubs, or gallops.  ABDOMEN: Soft, nontender, nondistended. Bowel sounds present. No organomegaly or mass.  EXTREMITIES: No cyanosis, clubbing or edema b/l.    NEUROLOGIC: Cranial nerves II through XII are intact. No focal Motor or sensory deficits b/l.   PSYCHIATRIC: The patient is alert and oriented x 3.  SKIN: No obvious rash, lesion, or ulcer.    LABORATORY PANEL:   CBC  Recent Labs Lab 10/29/16 0813  WBC 6.9  HGB 10.8*  HCT 32.4*  PLT 115*   ------------------------------------------------------------------------------------------------------------------  Chemistries   Recent Labs Lab 10/27/16 0747 10/28/16 0344 10/29/16 0813  NA 136 136 137  K 4.4 4.3 4.1  CL 100* 106 103  CO2 26 24 25   GLUCOSE 162* 80 135*  BUN 31* 23* 17  CREATININE 1.60* 1.45* 1.40*  CALCIUM 8.6* 8.4* 9.1  MG  --  2.2  --   AST 28  --   --   ALT 17  --   --   ALKPHOS 54  --   --   BILITOT 1.1  --   --    ------------------------------------------------------------------------------------------------------------------  Cardiac Enzymes  Recent Labs Lab 10/27/16 0752  TROPONINI <0.03   ------------------------------------------------------------------------------------------------------------------  RADIOLOGY:  No results found.   ASSESSMENT AND PLAN:   Todd Spencer  is a 66 y.o. male with a known history of Multiple medical problems including CAD status post stent several years ago, chronic angina, ischemic cardiomyopathy with last EF of 45%, CK D stage III, sleep apnea on CPAP at night times, hypertension, history of hiatal hernia presents to hospital secondary to worsening abdominal pain associated with nausea and vomiting.  #  1 Insulin dependent diabetes mellitus- no further hypoglycemia.  - has been taking his Lantus and regular insulin even though unable to eat due to bowel obstruction.  - cont. D5 fluids and sugars are improving.   - Cont. To hold  Lantus, cont. SSI.   #2 CAD-RCA stent several years ago, recent cardiac catheterization with chronic distal RCA occlusion, medical management recommended. - no acute symptoms.   - cont. To Hold aspirin at this time  #3 small bowel obstruction-partial obstruction - plan for NG tube removal later today and start on clear liquids. Passing Flatus.  - cont. Supportive care and further care as per surgery.   #4 obstructive sleep apnea - once NG tube removed can start on CPAP possibly.  -Continue oxygen supplementation at this time  #5 hypertension- will resume Oral meds when pt. Can take PO.   - cont. IV Metoprolol, Hydralazine for now.  - BP stable.    All the records are reviewed and case discussed with Care Management/Social Worker. Management plans discussed with the patient, family and they are in agreement.  CODE STATUS: Full code  DVT Prophylaxis: Hep SQ  TOTAL TIME TAKING CARE OF THIS PATIENT: 25 minutes.   POSSIBLE D/C  unclear, DEPENDING ON CLINICAL CONDITION.   Henreitta Leber M.D on 10/29/2016 at 3:00 PM  Between 7am to 6pm - Pager - [phone]  After 6pm go to [email or web address] - Proofreader  [facility]  Office  [phone]  CC: Primary care physician; [place] Pink, MD

## 2016-10-29 NOTE — Progress Notes (Signed)
Pt has refused CPAP for the second night.

## 2016-10-29 NOTE — Progress Notes (Signed)
CC: Small bowel obstruction Subjective: Patient admitted 2 days ago with a small bowel obstruction. Reports that he has been passing gas overnight. Feels much better today. Denies any abdominal pain with his only complaint being of the need of Lyrica.  Objective: Vital signs in last 24 hours: Temp:  [98.4 F (36.9 C)-99 F (37.2 C)] 98.4 F (36.9 C) (09/10 0530) Pulse Rate:  [85-92] 87 (09/10 0530) Resp:  [20] 20 (09/10 0530) BP: (109-155)/(56-75) 155/75 (09/10 0530) SpO2:  [94 %-100 %] 100 % (09/10 0530) Last BM Date: 10/27/16  Intake/Output from previous day: 09/09 0701 - 09/10 0700 In: 1676.3 [I.V.:1676.3] Out: 2275 [Urine:1450; Emesis/NG output:825] Intake/Output this shift: Total I/O In: 357.1 [I.V.:357.1] Out: -   Physical exam:  Gen.: No acute distress Chest: Clear to auscultation Heart: Regular rhythm Abdomen: Soft, nontender, nondistended  Lab Results: CBC   Recent Labs  10/28/16 0344 10/29/16 0813  WBC 6.4 6.9  HGB 10.4* 10.8*  HCT 31.3* 32.4*  PLT 106* 115*   BMET  Recent Labs  10/28/16 0344 10/29/16 0813  NA 136 137  K 4.3 4.1  CL 106 103  CO2 24 25  GLUCOSE 80 135*  BUN 23* 17  CREATININE 1.45* 1.40*  CALCIUM 8.4* 9.1   PT/INR No results for input(s): LABPROT, INR in the last 72 hours. ABG No results for input(s): PHART, HCO3 in the last 72 hours.  Invalid input(s): PCO2, PO2  Studies/Results: Dg Abdomen 1 View  Result Date: 10/27/2016 CLINICAL DATA:  Nasogastric tube placement. EXAM: ABDOMEN - 1 VIEW COMPARISON:  CT scan of same day.  Radiograph of December 12, 2015. FINDINGS: Distal tip of nasogastric tube appears to be in the proximal stomach. Mildly dilated small bowel loops are noted concerning for distal small bowel obstruction. Status post cholecystectomy. IMPRESSION: Distal tip of nasogastric tube appears to be in proximal stomach. Mildly dilated small bowel loops are noted concerning for distal small bowel obstruction.  Electronically Signed   By: Marijo Conception, M.D.   On: 10/27/2016 11:43   Ct Abdomen Pelvis W Contrast  Result Date: 10/27/2016 CLINICAL DATA:  Abdominal pain, nausea and vomiting. EXAM: CT ABDOMEN AND PELVIS WITH CONTRAST TECHNIQUE: Multidetector CT imaging of the abdomen and pelvis was performed using the standard protocol following bolus administration of intravenous contrast. CONTRAST:  80mL ISOVUE-300 IOPAMIDOL (ISOVUE-300) INJECTION 61% COMPARISON:  CT abdomen dated 12/12/2015. FINDINGS: Lower chest: Patchy consolidation within the right lower lobe, incompletely imaged, most likely atelectasis. Nodular component is seen within the medial aspects of the right lower lobe (series 4, image 7) measuring 14 mm. Hepatobiliary: No focal liver abnormality is seen. Status post cholecystectomy. No biliary dilatation. Pancreas: Unremarkable. No pancreatic ductal dilatation or surrounding inflammatory changes. Spleen: Normal in size without focal abnormality. Adrenals/Urinary Tract: Adrenal glands appear normal. Stable hypodense mass exophytic to the upper pole of the right kidney, measuring 2.7 cm, with CT density measurement of 30 Hounsfield units. Kidneys otherwise unremarkable without stone or hydronephrosis. No ureteral or bladder calculi identified. Bladder appears normal. Stomach/Bowel: Stomach and small bowel are moderately distended, fluid-filled, indicating some degree of small well obstruction. Transition zone is not clearly seen but is most likely in the right lower quadrant (series 2, images 75 and 76). No obstructing mass identified. Large bowel is relatively decompressed throughout. Appendix is normal. Scattered mild diverticulosis within the descending and sigmoid colon without evidence of acute diverticulitis. Vascular/Lymphatic: Aortic atherosclerosis. No enlarged abdominal or pelvic lymph nodes. Reproductive: Prostate gland is mildly  prominent in size. Other: No free fluid or abscess collections  seen. No free intraperitoneal air. Musculoskeletal: Mild degenerative change within the lumbar spine. No acute or suspicious osseous finding. IMPRESSION: 1. Small-bowel obstruction. Favor partial small bowel obstruction. Transition zone is not clearly seen but most likely in the right lower quadrant. No obstructing mass identified (adhesions? ). No free fluid or abscess collection seen. No free intraperitoneal air. 2. Patchy consolidation within the right lower lung, incompletely imaged, most of which is suggestive of atelectasis. Pneumonia or aspiration pneumonitis cannot be excluded. A nodular component within the medial aspects of the right lower lobe measures 14 mm, neoplastic nodule not excluded. Consider one of the following in 3 months for both low-risk and high-risk individuals: (a) repeat chest CT, (b) follow-up PET-CT, or (c) tissue sampling. This recommendation follows the consensus statement: Guidelines for Management of Incidental Pulmonary Nodules Detected on CT Images: From the Fleischner Society 2017; Radiology 2017; 284:228-243. 3. **An incidental finding of potential clinical significance has been found. Rounded mass exophytic to the upper pole of the right kidney, stable in size compared to CT of 12/12/2015, but with a CT density measurement of 30 Hounsfield units which is not compatible with a simple cyst. CT density measurement is similar on both early post contrast images and delayed postcontrast images suggesting benign hemorrhagic cyst. Solid neoplastic mass is considered less likely. Per consensus guidelines, would consider further characterization with nonemergent renal protocol CT or renal MRI. At minimum, recommend follow-up CT in 6-12 months to ensure continued stability in size. ** 4. Mild colonic diverticulosis without evidence of acute diverticulitis. 5. Aortic atherosclerosis. Electronically Signed   By: Franki Cabot M.D.   On: 10/27/2016 10:36     Anti-infectives: Anti-infectives    Start     Dose/Rate Route Frequency Ordered Stop   10/27/16 1045  piperacillin-tazobactam (ZOSYN) IVPB 3.375 g     3.375 g 100 mL/hr over 30 Minutes Intravenous  Once 10/27/16 1044 10/27/16 1815   10/27/16 1045  vancomycin (VANCOCIN) IVPB 1000 mg/200 mL premix     1,000 mg 200 mL/hr over 60 Minutes Intravenous  Once 10/27/16 1044 10/27/16 1236      Assessment/Plan:  66 year old male with a small bowel traction. Much improved. NG tube output has decreased dramatically but continues to be over 800 mL for the last 24 hours. Given this improvement and lack of other symptoms we started the patient on a clamp trial this morning. If he passes a 6 hour clamp trial could possibly remove NG tube later today. Encourage ambulation and incentive spirometer usage.  Lawton Dollinger T. Adonis Huguenin, MD, [facility] General Surgeon [facility]  [facility] [phone] [facility] [phone] 10/29/2016

## 2016-10-29 NOTE — Progress Notes (Signed)
  Patient revisited  NG tube return to suction with less than 5 mL of output Order to remove NG tube provided nurse. Discussed starting clear liquids with the patient for tonight.  Encourage ambulation and incentive spirometer usage.  Clayburn Pert, MD [facility]  [facility] [phone] [facility] [phone]

## 2016-10-30 LAB — GLUCOSE, CAPILLARY
Glucose-Capillary: 127 mg/dL — ABNORMAL HIGH (ref 65–99)
Glucose-Capillary: 145 mg/dL — ABNORMAL HIGH (ref 65–99)
Glucose-Capillary: 161 mg/dL — ABNORMAL HIGH (ref 65–99)
Glucose-Capillary: 194 mg/dL — ABNORMAL HIGH (ref 65–99)

## 2016-10-30 NOTE — Progress Notes (Signed)
Pt has been discharge

## 2016-10-30 NOTE — Progress Notes (Signed)
CC: Small bowel obstruction Subjective: Patient reports he did well overnight. Denies any continued abdominal pain, nausea, vomiting. Tolerated clear liquid diet.  Objective: Vital signs in last 24 hours: Temp:  [97.6 F (36.4 C)-98.9 F (37.2 C)] 97.6 F (36.4 C) (09/11 0436) Pulse Rate:  [72-81] 81 (09/11 0436) Resp:  [16-19] 16 (09/11 0436) BP: (130-137)/(62-81) 137/81 (09/11 0436) SpO2:  [90 %-94 %] 93 % (09/11 0436) Last BM Date: 10/27/16  Intake/Output from previous day: 09/10 0701 - 09/11 0700 In: 2559.6 [P.O.:780; I.V.:1779.6] Out: 500 [Urine:500] Intake/Output this shift: Total I/O In: 233 [I.V.:233] Out: -   Physical exam:  Gen.: No acute distress  chest: Clear to auscultation  heart: Regular rhythm Abdomen: Soft, nontender, nondistended  Lab Results: CBC   Recent Labs  10/28/16 0344 10/29/16 0813  WBC 6.4 6.9  HGB 10.4* 10.8*  HCT 31.3* 32.4*  PLT 106* 115*   BMET  Recent Labs  10/28/16 0344 10/29/16 0813  NA 136 137  K 4.3 4.1  CL 106 103  CO2 24 25  GLUCOSE 80 135*  BUN 23* 17  CREATININE 1.45* 1.40*  CALCIUM 8.4* 9.1   PT/INR No results for input(s): LABPROT, INR in the last 72 hours. ABG No results for input(s): PHART, HCO3 in the last 72 hours.  Invalid input(s): PCO2, PO2  Studies/Results: No results found.  Anti-infectives: Anti-infectives    Start     Dose/Rate Route Frequency Ordered Stop   10/27/16 1045  piperacillin-tazobactam (ZOSYN) IVPB 3.375 g     3.375 g 100 mL/hr over 30 Minutes Intravenous  Once 10/27/16 1044 10/27/16 1815   10/27/16 1045  vancomycin (VANCOCIN) IVPB 1000 mg/200 mL premix     1,000 mg 200 mL/hr over 60 Minutes Intravenous  Once 10/27/16 1044 10/27/16 1236      Assessment/Plan:  66 year old male with what appears to be a resolved small bowel obstruction. Tolerated his clear liquid diet. Discussed we would advance him to regular and stop his IV fluids. Encourage ambulation and incentive  spirometer usage. Possible discharge home later today.  Mackenzy Grumbine T. Adonis Huguenin, MD, [facility] General Surgeon [facility]  [facility] [phone] [facility] [phone] 10/30/2016

## 2016-10-30 NOTE — Progress Notes (Signed)
Pt. Likely to be discharged home today.  Discussed w/ Dr. Adonis Huguenin and will sign off.  Please call back if any further help needed.

## 2016-10-30 NOTE — Discharge Instructions (Signed)
Small Bowel Obstruction °A small bowel obstruction is a blockage in the small bowel. The small bowel, which is also called the small intestine, is a long, slender tube that connects the stomach to the colon. When a person eats and drinks, food and fluids go from the stomach to the small bowel. This is where most of the nutrients in the food and fluids are absorbed. °A small bowel obstruction will prevent food and fluids from passing through the small bowel as they normally do during digestion. The small bowel can become partially or completely blocked. This can cause symptoms such as abdominal pain, vomiting, and bloating. If this condition is not treated, it can be dangerous because the small bowel could rupture. °What are the causes? °Common causes of this condition include: °· Scar tissue from previous surgery or radiation treatment. °· Recent surgery. This may cause the movements of the bowel to slow down and cause food to block the intestine. °· Hernias. °· Inflammatory bowel disease (colitis). °· Twisting of the bowel (volvulus). °· Tumors. °· A foreign body. °· Slipping of a part of the bowel into another part (intussusception). °What are the signs or symptoms? °Symptoms of this condition include: °· Abdominal pain. This may be dull cramps or sharp pain. It may occur in one area, or it may be present in the entire abdomen. Pain can range from mild to severe, depending on the degree of obstruction. °· Nausea and vomiting. Vomit may be greenish or a yellow bile color. °· Abdominal bloating. °· Constipation. °· Lack of passing gas. °· Frequent belching. °· Diarrhea. This may occur if the obstruction is partial and runny stool is able to leak around the obstruction. °How is this diagnosed? °This condition may be diagnosed based on a physical exam, medical history, and X-rays of the abdomen. You may also have other tests, such as a CT scan of the abdomen and pelvis. °How is this treated? °Treatment for this  condition depends on the cause and severity of the problem. Treatment options may include: °· Bed rest along with fluids and pain medicines that are given through an IV tube inserted into one of your veins. Sometimes, this is all that is needed for the obstruction to improve. °· Following a simple diet. In some cases, a clear liquid diet may be required for several days. This allows the bowel to rest. °· Placement of a small tube (nasogastric tube) into the stomach. When the bowel is blocked, it usually swells up like a balloon that is filled with air and fluids. The air and fluids may be removed by suction through the nasogastric tube. This can help with pain, discomfort, and nausea. It can also help the obstruction to clear up faster. °· Surgery. This may be required if other treatments do not work. Bowel obstruction from a hernia may require early surgery and can be an emergency procedure. Surgery may also be required for scar tissue that causes frequent or severe obstructions. °Follow these instructions at home: °· Get plenty of rest. °· Follow instructions from your health care provider about eating restrictions. You may need to avoid solid foods and consume only clear liquids until your condition improves. °· Take over-the-counter and prescription medicines only as told by your health care provider. °· Keep all follow-up visits as told by your health care provider. This is important. °Contact a health care provider if: °· You have a fever. °· You have chills. °Get help right away if: °· You have increased   pain or cramping. °· You vomit blood. °· You have uncontrolled vomiting or nausea. °· You cannot drink fluids because of vomiting or pain. °· You develop confusion. °· You begin feeling very dry or thirsty (dehydrated). °· You have severe bloating. °· You feel extremely weak or you faint. °This information is not intended to replace advice given to you by your health care provider. Make sure you discuss any  questions you have with your health care provider. °Document Released: 04/24/2005 Document Revised: 10/03/2015 Document Reviewed: 04/01/2014 °Elsevier Interactive Patient Education © 2017 [employer] ° °

## 2016-10-30 NOTE — Discharge Summary (Signed)
Patient ID: Todd Spencer MRN: [id number] DOB/AGE: 1950-04-26 66 y.o.  Admit date: 10/27/2016 Discharge date: 10/30/2016  Discharge Diagnoses:  Small bowel obstruction   Procedures Performed: None  Discharged Condition: good  Hospital Course: Patient admitted with a small bowel obstruction. Obstruction resolved with NG decompression. Able to tolerate regular diet on day of discharge. Will need as needed follow up.  Discharge Orders: Discharge Instructions    Call MD for:  persistant nausea and vomiting    Complete by:  As directed    Call MD for:  severe uncontrolled pain    Complete by:  As directed    Call MD for:  temperature >100.4    Complete by:  As directed    Diet - low sodium heart healthy    Complete by:  As directed    Increase activity slowly    Complete by:  As directed       Disposition: 01-Home or Self Care  Discharge Medications: Allergies as of 10/30/2016   No Known Allergies     Medication List    TAKE these medications   acetaminophen 325 MG tablet Commonly known as:  TYLENOL Take 2 tablets (650 mg total) by mouth every 6 (six) hours as needed for mild pain, fever or headache (headache).   aspirin 81 MG chewable tablet Chew 81 mg by mouth daily.   atorvastatin 40 MG tablet Commonly known as:  LIPITOR Take 40 mg by mouth daily.   carvedilol 3.125 MG tablet Commonly known as:  COREG Take 3.125 mg by mouth 2 (two) times daily with a meal.   diazepam 5 MG tablet Commonly known as:  VALIUM Take 5 mg by mouth every 12 (twelve) hours as needed for anxiety.   donepezil 10 MG tablet Commonly known as:  ARICEPT Take 10 mg by mouth at bedtime.   doxepin 100 MG capsule Commonly known as:  SINEQUAN Take 100-200 mg by mouth 2 (two) times daily. 100 mg in the morning and 200 mg at bedtime   gemfibrozil 600 MG tablet Commonly known as:  LOPID Take 600 mg by mouth 2 (two) times daily before a meal.   guaiFENesin 600 MG 12 hr tablet Commonly  known as:  MUCINEX Take 600 mg by mouth 2 (two) times daily as needed for cough or to loosen phlegm.   insulin glargine 100 UNIT/ML injection Commonly known as:  LANTUS Inject 0.9 mLs (90 Units total) into the skin at bedtime. What changed:  when to take this   insulin regular 100 units/mL injection Commonly known as:  NOVOLIN R,HUMULIN R Inject 20-25 Units into the skin 2 (two) times daily before a meal. 20 units with breakfast and 25 with supper   isosorbide dinitrate 30 MG tablet Commonly known as:  ISORDIL Take 30 mg by mouth 3 (three) times daily.   meclizine 25 MG tablet Commonly known as:  ANTIVERT Take 25 mg by mouth 3 (three) times daily as needed.   metFORMIN 500 MG 24 hr tablet Commonly known as:  GLUCOPHAGE-XR Take 1,500 mg by mouth at bedtime.   nitroGLYCERIN 0.4 MG SL tablet Commonly known as:  NITROSTAT Place 0.4 mg under the tongue every 5 (five) minutes as needed.   pregabalin 300 MG capsule Commonly known as:  LYRICA Take 300 mg by mouth 2 (two) times daily.   quinapril 5 MG tablet Commonly known as:  ACCUPRIL Take 5 mg by mouth daily.   ranitidine 150 MG tablet Commonly known as:  ZANTAC  Take 150 mg by mouth 2 (two) times daily.            Discharge Care Instructions        Start     Ordered   10/30/16 0000  Increase activity slowly     10/30/16 1211   10/30/16 0000  Diet - low sodium heart healthy     10/30/16 1211   10/30/16 0000  Call MD for:  temperature >100.4     10/30/16 1211   10/30/16 0000  Call MD for:  persistant nausea and vomiting     10/30/16 1211   10/30/16 0000  Call MD for:  severe uncontrolled pain     10/30/16 1211       Follwup: Follow-up [place]. Call in 1 week(s).   Specialty:  General Surgery Why:  Call on an as needed basis Contact information: [address] [place] [phone]          Signed: Clayburn Pert 10/30/2016, 12:14 PM

## 2016-11-01 LAB — CULTURE, BLOOD (ROUTINE X 2)
CULTURE: NO GROWTH
Culture: NO GROWTH
SPECIAL REQUESTS: ADEQUATE
Special Requests: ADEQUATE

## 2016-11-08 DIAGNOSIS — E1165 Type 2 diabetes mellitus with hyperglycemia: Secondary | ICD-10-CM | POA: Diagnosis not present

## 2016-11-08 DIAGNOSIS — Z23 Encounter for immunization: Secondary | ICD-10-CM | POA: Diagnosis not present

## 2016-11-08 DIAGNOSIS — Z794 Long term (current) use of insulin: Secondary | ICD-10-CM | POA: Diagnosis not present

## 2016-11-15 DIAGNOSIS — E11649 Type 2 diabetes mellitus with hypoglycemia without coma: Secondary | ICD-10-CM | POA: Diagnosis not present

## 2016-11-15 DIAGNOSIS — E113293 Type 2 diabetes mellitus with mild nonproliferative diabetic retinopathy without macular edema, bilateral: Secondary | ICD-10-CM | POA: Diagnosis not present

## 2016-11-15 DIAGNOSIS — Z794 Long term (current) use of insulin: Secondary | ICD-10-CM | POA: Diagnosis not present

## 2016-11-15 DIAGNOSIS — N183 Chronic kidney disease, stage 3 (moderate): Secondary | ICD-10-CM | POA: Diagnosis not present

## 2016-11-15 DIAGNOSIS — E1122 Type 2 diabetes mellitus with diabetic chronic kidney disease: Secondary | ICD-10-CM | POA: Diagnosis not present

## 2016-11-15 DIAGNOSIS — E1142 Type 2 diabetes mellitus with diabetic polyneuropathy: Secondary | ICD-10-CM | POA: Diagnosis not present

## 2016-11-15 DIAGNOSIS — E1159 Type 2 diabetes mellitus with other circulatory complications: Secondary | ICD-10-CM | POA: Diagnosis not present

## 2016-12-08 ENCOUNTER — Encounter (HOSPITAL_COMMUNITY): Payer: Self-pay | Admitting: *Deleted

## 2016-12-08 ENCOUNTER — Emergency Department (HOSPITAL_COMMUNITY)
Admission: EM | Admit: 2016-12-08 | Discharge: 2016-12-09 | Disposition: A | Discharge status: Home / Self Care | Payer: Medicare HMO | Attending: Emergency Medicine | Admitting: Emergency Medicine

## 2016-12-08 DIAGNOSIS — I2510 Atherosclerotic heart disease of native coronary artery without angina pectoris: Secondary | ICD-10-CM | POA: Insufficient documentation

## 2016-12-08 DIAGNOSIS — G308 Other Alzheimer's disease: Secondary | ICD-10-CM | POA: Insufficient documentation

## 2016-12-08 DIAGNOSIS — I5022 Chronic systolic (congestive) heart failure: Secondary | ICD-10-CM | POA: Insufficient documentation

## 2016-12-08 DIAGNOSIS — E1122 Type 2 diabetes mellitus with diabetic chronic kidney disease: Secondary | ICD-10-CM | POA: Diagnosis not present

## 2016-12-08 DIAGNOSIS — Z79899 Other long term (current) drug therapy: Secondary | ICD-10-CM | POA: Insufficient documentation

## 2016-12-08 DIAGNOSIS — Z955 Presence of coronary angioplasty implant and graft: Secondary | ICD-10-CM | POA: Diagnosis not present

## 2016-12-08 DIAGNOSIS — Z794 Long term (current) use of insulin: Secondary | ICD-10-CM | POA: Diagnosis not present

## 2016-12-08 DIAGNOSIS — Z7982 Long term (current) use of aspirin: Secondary | ICD-10-CM | POA: Insufficient documentation

## 2016-12-08 DIAGNOSIS — I130 Hypertensive heart and chronic kidney disease with heart failure and stage 1 through stage 4 chronic kidney disease, or unspecified chronic kidney disease: Secondary | ICD-10-CM | POA: Insufficient documentation

## 2016-12-08 DIAGNOSIS — T50901A Poisoning by unspecified drugs, medicaments and biological substances, accidental (unintentional), initial encounter: Secondary | ICD-10-CM | POA: Diagnosis not present

## 2016-12-08 DIAGNOSIS — F0281 Dementia in other diseases classified elsewhere with behavioral disturbance: Secondary | ICD-10-CM | POA: Insufficient documentation

## 2016-12-08 DIAGNOSIS — Z87891 Personal history of nicotine dependence: Secondary | ICD-10-CM | POA: Diagnosis not present

## 2016-12-08 DIAGNOSIS — N183 Chronic kidney disease, stage 3 (moderate): Secondary | ICD-10-CM | POA: Insufficient documentation

## 2016-12-08 LAB — CBG MONITORING, ED
Glucose-Capillary: 206 mg/dL — ABNORMAL HIGH (ref 65–99)
Glucose-Capillary: 81 mg/dL (ref 65–99)
Glucose-Capillary: 93 mg/dL (ref 65–99)

## 2016-12-08 MED ORDER — DEXTROSE 10 % IV SOLN
Freq: Once | INTRAVENOUS | Status: AC
Start: 2016-12-08 — End: 2016-12-09
  Administered 2016-12-08: 23:00:00 via INTRAVENOUS

## 2016-12-08 NOTE — ED Triage Notes (Signed)
Pt states that he accidentally took 90 units of Novolin R instead of lantus tonight,

## 2016-12-09 LAB — CBG MONITORING, ED
GLUCOSE-CAPILLARY: 175 mg/dL — AB (ref 65–99)
GLUCOSE-CAPILLARY: 230 mg/dL — AB (ref 65–99)
GLUCOSE-CAPILLARY: 237 mg/dL — AB (ref 65–99)
GLUCOSE-CAPILLARY: 93 mg/dL (ref 65–99)
Glucose-Capillary: 135 mg/dL — ABNORMAL HIGH (ref 65–99)
Glucose-Capillary: 156 mg/dL — ABNORMAL HIGH (ref 65–99)
Glucose-Capillary: 189 mg/dL — ABNORMAL HIGH (ref 65–99)
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
## 2016-12-09 NOTE — Discharge Instructions (Addendum)
Take your medications as prescribed. Follow up with your doctor. Return to the ED if you develop new or worsening symptoms.

## 2016-12-09 NOTE — ED Notes (Signed)
Pt eating breakfast. Todd Spencer [phone] called for d/c transportation.

## 2016-12-09 NOTE — ED Provider Notes (Addendum)
Patient With accidental injection of 90 units of Novolin instead of Lantus around 8:20 PM. he is being fed and giving a D10 infusion.  Plan is to watch at least 8 hours.  D/w Bryson Ha at [facility] center.  States regular insulin peak would be 8-12 hours and recommends watching for 12 hours which will be 830 am. Will stop D10 gtt now.  Blood sugars have stabilized.  His asymptomatic and tolerating p.o.  He denies any suicidal attempt and states it was accidental.  Patient most recent sugars 189.  Sugars have stabilized overnight and no further episodes of hypoglycemia. He appears stable for discharge home. Return precautions discussed.   CRITICAL CARE Performed by: Ezequiel Essex Total critical care time: 40 minutes Critical care time was exclusive of separately billable procedures and treating other patients. Critical care was necessary to treat or prevent imminent or life-threatening deterioration. Critical care was time spent personally by me on the following activities: development of treatment plan with patient and/or surrogate as well as nursing, discussions with consultants, evaluation of patient's response to treatment, examination of patient, obtaining history from patient or surrogate, ordering and performing treatments and interventions, ordering and review of laboratory studies, ordering and review of radiographic studies, pulse oximetry and re-evaluation of patient's condition.    Ezequiel Essex, MD 12/09/16 [id number]    Ezequiel Essex, MD 12/09/16 [phone]

## 2016-12-27 DIAGNOSIS — E113291 Type 2 diabetes mellitus with mild nonproliferative diabetic retinopathy without macular edema, right eye: Secondary | ICD-10-CM | POA: Diagnosis not present

## 2017-01-04 ENCOUNTER — Emergency Department: Payer: Medicare HMO

## 2017-01-04 ENCOUNTER — Encounter: Payer: Self-pay | Admitting: Emergency Medicine

## 2017-01-04 ENCOUNTER — Inpatient Hospital Stay
Admission: EM | Admit: 2017-01-04 | Discharge: 2017-01-06 | DRG: 389 | Disposition: A | Discharge status: Hospice | Payer: Medicare HMO | Attending: Surgery | Admitting: Surgery

## 2017-01-04 DIAGNOSIS — Z961 Presence of intraocular lens: Secondary | ICD-10-CM | POA: Diagnosis present

## 2017-01-04 DIAGNOSIS — N183 Chronic kidney disease, stage 3 unspecified: Secondary | ICD-10-CM | POA: Diagnosis present

## 2017-01-04 DIAGNOSIS — I10 Essential (primary) hypertension: Secondary | ICD-10-CM | POA: Diagnosis not present

## 2017-01-04 DIAGNOSIS — Z9842 Cataract extraction status, left eye: Secondary | ICD-10-CM

## 2017-01-04 DIAGNOSIS — E1140 Type 2 diabetes mellitus with diabetic neuropathy, unspecified: Secondary | ICD-10-CM | POA: Diagnosis present

## 2017-01-04 DIAGNOSIS — I255 Ischemic cardiomyopathy: Secondary | ICD-10-CM | POA: Diagnosis not present

## 2017-01-04 DIAGNOSIS — I5022 Chronic systolic (congestive) heart failure: Secondary | ICD-10-CM | POA: Diagnosis present

## 2017-01-04 DIAGNOSIS — Z9841 Cataract extraction status, right eye: Secondary | ICD-10-CM

## 2017-01-04 DIAGNOSIS — Z7982 Long term (current) use of aspirin: Secondary | ICD-10-CM

## 2017-01-04 DIAGNOSIS — I130 Hypertensive heart and chronic kidney disease with heart failure and stage 1 through stage 4 chronic kidney disease, or unspecified chronic kidney disease: Secondary | ICD-10-CM | POA: Diagnosis not present

## 2017-01-04 DIAGNOSIS — Z9049 Acquired absence of other specified parts of digestive tract: Secondary | ICD-10-CM | POA: Diagnosis not present

## 2017-01-04 DIAGNOSIS — F329 Major depressive disorder, single episode, unspecified: Secondary | ICD-10-CM | POA: Diagnosis not present

## 2017-01-04 DIAGNOSIS — H9190 Unspecified hearing loss, unspecified ear: Secondary | ICD-10-CM | POA: Diagnosis not present

## 2017-01-04 DIAGNOSIS — E119 Type 2 diabetes mellitus without complications: Secondary | ICD-10-CM | POA: Diagnosis not present

## 2017-01-04 DIAGNOSIS — Z9861 Coronary angioplasty status: Secondary | ICD-10-CM

## 2017-01-04 DIAGNOSIS — E875 Hyperkalemia: Secondary | ICD-10-CM | POA: Diagnosis present

## 2017-01-04 DIAGNOSIS — R1010 Upper abdominal pain, unspecified: Secondary | ICD-10-CM | POA: Diagnosis not present

## 2017-01-04 DIAGNOSIS — E785 Hyperlipidemia, unspecified: Secondary | ICD-10-CM | POA: Diagnosis present

## 2017-01-04 DIAGNOSIS — K565 Intestinal adhesions [bands], unspecified as to partial versus complete obstruction: Secondary | ICD-10-CM | POA: Diagnosis present

## 2017-01-04 DIAGNOSIS — N189 Chronic kidney disease, unspecified: Secondary | ICD-10-CM | POA: Diagnosis not present

## 2017-01-04 DIAGNOSIS — Z950 Presence of cardiac pacemaker: Secondary | ICD-10-CM

## 2017-01-04 DIAGNOSIS — R1030 Lower abdominal pain, unspecified: Secondary | ICD-10-CM | POA: Diagnosis not present

## 2017-01-04 DIAGNOSIS — Z6832 Body mass index (BMI) 32.0-32.9, adult: Secondary | ICD-10-CM

## 2017-01-04 DIAGNOSIS — Z794 Long term (current) use of insulin: Secondary | ICD-10-CM

## 2017-01-04 DIAGNOSIS — Z79899 Other long term (current) drug therapy: Secondary | ICD-10-CM

## 2017-01-04 DIAGNOSIS — Z87891 Personal history of nicotine dependence: Secondary | ICD-10-CM

## 2017-01-04 DIAGNOSIS — I252 Old myocardial infarction: Secondary | ICD-10-CM | POA: Diagnosis not present

## 2017-01-04 DIAGNOSIS — E669 Obesity, unspecified: Secondary | ICD-10-CM | POA: Diagnosis present

## 2017-01-04 DIAGNOSIS — G4733 Obstructive sleep apnea (adult) (pediatric): Secondary | ICD-10-CM | POA: Diagnosis present

## 2017-01-04 DIAGNOSIS — K56609 Unspecified intestinal obstruction, unspecified as to partial versus complete obstruction: Secondary | ICD-10-CM

## 2017-01-04 DIAGNOSIS — R109 Unspecified abdominal pain: Secondary | ICD-10-CM | POA: Diagnosis not present

## 2017-01-04 DIAGNOSIS — E1122 Type 2 diabetes mellitus with diabetic chronic kidney disease: Secondary | ICD-10-CM | POA: Diagnosis not present

## 2017-01-04 DIAGNOSIS — I2510 Atherosclerotic heart disease of native coronary artery without angina pectoris: Secondary | ICD-10-CM | POA: Diagnosis not present

## 2017-01-04 DIAGNOSIS — K5652 Intestinal adhesions [bands] with complete obstruction: Principal | ICD-10-CM | POA: Diagnosis present

## 2017-01-04 DIAGNOSIS — J984 Other disorders of lung: Secondary | ICD-10-CM | POA: Diagnosis not present

## 2017-01-04 LAB — CBC WITH DIFFERENTIAL/PLATELET
BASOS ABS: 0 10*3/uL (ref 0–0.1)
BASOS PCT: 0 %
EOS ABS: 0.1 10*3/uL (ref 0–0.7)
Eosinophils Relative: 1 %
HCT: 39.3 % — ABNORMAL LOW (ref 40.0–52.0)
HEMOGLOBIN: 12.4 g/dL — AB (ref 13.0–18.0)
Lymphocytes Relative: 12 %
Lymphs Abs: 0.7 10*3/uL — ABNORMAL LOW (ref 1.0–3.6)
MCH: 24.2 pg — ABNORMAL LOW (ref 26.0–34.0)
MCHC: 31.5 g/dL — AB (ref 32.0–36.0)
MCV: 76.9 fL — ABNORMAL LOW (ref 80.0–100.0)
Monocytes Absolute: 0.5 10*3/uL (ref 0.2–1.0)
Monocytes Relative: 9 %
NEUTROS PCT: 78 %
Neutro Abs: 4.2 10*3/uL (ref 1.4–6.5)
Platelets: 113 10*3/uL — ABNORMAL LOW (ref 150–440)
RBC: 5.11 MIL/uL (ref 4.40–5.90)
RDW: 18.5 % — ABNORMAL HIGH (ref 11.5–14.5)
WBC: 5.4 10*3/uL (ref 3.8–10.6)

## 2017-01-04 LAB — BASIC METABOLIC PANEL
Anion gap: 7 (ref 5–15)
BUN: 26 mg/dL — AB (ref 6–20)
CO2: 23 mmol/L (ref 22–32)
CREATININE: 1.53 mg/dL — AB (ref 0.61–1.24)
Calcium: 9.3 mg/dL (ref 8.9–10.3)
Chloride: 108 mmol/L (ref 101–111)
GFR, EST AFRICAN AMERICAN: 52 mL/min — AB (ref >60)
GFR, EST NON AFRICAN AMERICAN: 45 mL/min — AB (ref >60)
Glucose, Bld: 139 mg/dL — ABNORMAL HIGH (ref 65–99)
POTASSIUM: 5.6 mmol/L — AB (ref 3.5–5.1)
Sodium: 138 mmol/L (ref 135–145)

## 2017-01-04 LAB — HEPATIC FUNCTION PANEL
ALT: 19 U/L (ref 17–63)
AST: 21 U/L (ref 15–41)
Albumin: 4.2 g/dL (ref 3.5–5.0)
Alkaline Phosphatase: 74 U/L (ref 38–126)
Bilirubin, Direct: <0.1 mg/dL — ABNORMAL LOW (ref 0.1–0.5)
TOTAL PROTEIN: 7.9 g/dL (ref 6.5–8.1)
Total Bilirubin: 0.7 mg/dL (ref 0.3–1.2)

## 2017-01-04 LAB — LIPASE, BLOOD: LIPASE: 32 U/L (ref 11–51)

## 2017-01-04 LAB — LACTIC ACID, PLASMA: LACTIC ACID, VENOUS: 0.9 mmol/L (ref 0.5–1.9)

## 2017-01-04 LAB — TROPONIN I

## 2017-01-04 MED ORDER — KCL IN DEXTROSE-NACL 20-5-0.45 MEQ/L-%-% IV SOLN
INTRAVENOUS | Status: DC
  Administered 2017-01-05: 01:00:00 via INTRAVENOUS
  Filled 2017-01-04 (×2): qty 1000

## 2017-01-04 MED ORDER — LIDOCAINE HCL 2 % EX GEL: 1.0000 "application " | Freq: Once | CUTANEOUS | Status: DC

## 2017-01-04 MED ORDER — HEPARIN SODIUM (PORCINE) 5000 UNIT/ML IJ SOLN
5000.0000 [IU] | Freq: Three times a day (TID) | INTRAMUSCULAR | Status: DC
  Administered 2017-01-05 – 2017-01-06 (×5): 5000 [IU] via SUBCUTANEOUS
  Filled 2017-01-04 (×5): qty 1

## 2017-01-04 MED ORDER — SODIUM CHLORIDE 0.9 % IV BOLUS (SEPSIS)
1000.0000 mL | Freq: Once | INTRAVENOUS | Status: AC
  Administered 2017-01-04: 1000 mL via INTRAVENOUS

## 2017-01-04 MED ORDER — IOPAMIDOL (ISOVUE-300) INJECTION 61%
75.0000 mL | Freq: Once | INTRAVENOUS | Status: AC | PRN
  Administered 2017-01-04: 75 mL via INTRAVENOUS

## 2017-01-04 NOTE — ED Notes (Signed)
Surgeon at bedside.  

## 2017-01-04 NOTE — ED Notes (Signed)
Chest xray in progress

## 2017-01-04 NOTE — ED Triage Notes (Signed)
Ems states pt with abd pain since this pm. On their arrival pt with diaphoresis, pale skin, sinus bradycardia. Pt go 143mcg of fentanyl.
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
## 2017-01-04 NOTE — Progress Notes (Signed)
Todd Spencer [id number]   @DEPARTMENTNAME @ Exam Date:@TODAY @                   POST EXAM METFORMIN PATIENT INSTRUCTIONS  As part of your exam today in the Radiology Department, you were given  a radiographic contrast material or x-ray dye.  Because you have had this contrast material and you are taking a metformin  drug, please observe the following instructions:  DO NOT take your metformin medication for 48 hours after your test and notify your doctor for additional information and instructions.  I understand these instructions and have had an opportunity to discuss them with the Radiology Department personnel.    ___________________________________          _____________________________ Patient                                                                                                              Date    __________________________________           ______________________________ Witness                                                                                                            Date

## 2017-01-04 NOTE — ED Notes (Signed)
Pt asleep.

## 2017-01-04 NOTE — ED Notes (Signed)
Pt repeatedly asking for po fluids and ice chips. Explanation of NPO explained multiple times to pt.

## 2017-01-04 NOTE — ED Notes (Signed)
Patient transported to CT 
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
## 2017-01-04 NOTE — ED Notes (Signed)
Pt assisted up to commode for bowel movement.

## 2017-01-04 NOTE — ED Notes (Signed)
Pt placed on oxygen at 2lpm via [place] for pox of 88% on ra. Pox up to 94% after oxygen application.

## 2017-01-04 NOTE — ED Notes (Signed)
Pt tolerated NG insertion well. Prior to insertion pt with emesis, yellow liquid approx 619ml.

## 2017-01-04 NOTE — ED Notes (Signed)
Pt sleeping. 
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
## 2017-01-05 ENCOUNTER — Inpatient Hospital Stay: Payer: Medicare HMO

## 2017-01-05 ENCOUNTER — Other Ambulatory Visit: Payer: Self-pay

## 2017-01-05 LAB — GLUCOSE, CAPILLARY
GLUCOSE-CAPILLARY: 174 mg/dL — AB (ref 65–99)
GLUCOSE-CAPILLARY: 188 mg/dL — AB (ref 65–99)
Glucose-Capillary: 136 mg/dL — ABNORMAL HIGH (ref 65–99)

## 2017-01-05 LAB — URINALYSIS, COMPLETE (UACMP) WITH MICROSCOPIC
BILIRUBIN URINE: NEGATIVE
Bacteria, UA: NONE SEEN
Glucose, UA: NEGATIVE mg/dL
HGB URINE DIPSTICK: NEGATIVE
KETONES UR: 5 mg/dL — AB
LEUKOCYTES UA: NEGATIVE
Nitrite: NEGATIVE
PROTEIN: NEGATIVE mg/dL
Specific Gravity, Urine: 1.029 (ref 1.005–1.030)
Squamous Epithelial / LPF: NONE SEEN
pH: 5 (ref 5.0–8.0)

## 2017-01-05 LAB — BASIC METABOLIC PANEL
ANION GAP: 6 (ref 5–15)
BUN: 32 mg/dL — ABNORMAL HIGH (ref 6–20)
CALCIUM: 8.1 mg/dL — AB (ref 8.9–10.3)
CO2: 22 mmol/L (ref 22–32)
CREATININE: 1.39 mg/dL — AB (ref 0.61–1.24)
Chloride: 110 mmol/L (ref 101–111)
GFR calc Af Amer: 59 mL/min — ABNORMAL LOW (ref >60)
GFR, EST NON AFRICAN AMERICAN: 51 mL/min — AB (ref >60)
Glucose, Bld: 191 mg/dL — ABNORMAL HIGH (ref 65–99)
Potassium: 5.2 mmol/L — ABNORMAL HIGH (ref 3.5–5.1)
SODIUM: 138 mmol/L (ref 135–145)

## 2017-01-05 LAB — CBC
HEMATOCRIT: 35.8 % — AB (ref 40.0–52.0)
Hemoglobin: 11.3 g/dL — ABNORMAL LOW (ref 13.0–18.0)
MCH: 23.9 pg — ABNORMAL LOW (ref 26.0–34.0)
MCHC: 31.5 g/dL — ABNORMAL LOW (ref 32.0–36.0)
MCV: 76.1 fL — ABNORMAL LOW (ref 80.0–100.0)
PLATELETS: 101 10*3/uL — AB (ref 150–440)
RBC: 4.7 MIL/uL (ref 4.40–5.90)
RDW: 18.5 % — AB (ref 11.5–14.5)
WBC: 5.8 10*3/uL (ref 3.8–10.6)

## 2017-01-05 MED ORDER — INSULIN ASPART 100 UNIT/ML ~~LOC~~ SOLN
0.0000 [IU] | SUBCUTANEOUS | Status: DC
  Administered 2017-01-05: 4 [IU] via SUBCUTANEOUS
  Administered 2017-01-05: 3 [IU] via SUBCUTANEOUS
  Administered 2017-01-06: 4 [IU] via SUBCUTANEOUS
  Administered 2017-01-06: 3 [IU] via SUBCUTANEOUS
  Administered 2017-01-06: 7 [IU] via SUBCUTANEOUS
  Administered 2017-01-06: 4 [IU] via SUBCUTANEOUS
  Filled 2017-01-05 (×6): qty 1

## 2017-01-05 MED ORDER — HYDROMORPHONE HCL 1 MG/ML IJ SOLN
0.5000 mg | INTRAMUSCULAR | Status: DC | PRN
  Administered 2017-01-05 (×2): 0.5 mg via INTRAVENOUS
  Filled 2017-01-05 (×3): qty 0.5

## 2017-01-05 MED ORDER — ACETAMINOPHEN 10 MG/ML IV SOLN
1000.0000 mg | Freq: Once | INTRAVENOUS | Status: AC
  Administered 2017-01-05: 1000 mg via INTRAVENOUS
  Filled 2017-01-05: qty 100

## 2017-01-05 MED ORDER — DONEPEZIL HCL 5 MG PO TABS
10.0000 mg | ORAL_TABLET | Freq: Every day | ORAL | Status: DC
  Administered 2017-01-05: 10 mg via ORAL
  Filled 2017-01-05 (×2): qty 2

## 2017-01-05 MED ORDER — SODIUM POLYSTYRENE SULFONATE 15 GM/60ML PO SUSP
15.0000 g | Freq: Once | ORAL | Status: AC
  Administered 2017-01-05: 15 g via ORAL
  Filled 2017-01-05: qty 60

## 2017-01-05 MED ORDER — PREGABALIN 75 MG PO CAPS
300.0000 mg | ORAL_CAPSULE | Freq: Two times a day (BID) | ORAL | Status: DC
  Administered 2017-01-05 – 2017-01-06 (×3): 300 mg via ORAL
  Filled 2017-01-05 (×3): qty 4

## 2017-01-05 MED ORDER — ACETAMINOPHEN 500 MG PO TABS
1000.0000 mg | ORAL_TABLET | Freq: Four times a day (QID) | ORAL | Status: DC | PRN
  Administered 2017-01-05 – 2017-01-06 (×3): 1000 mg via ORAL
  Filled 2017-01-05 (×3): qty 2

## 2017-01-05 MED ORDER — ISOSORBIDE DINITRATE 30 MG PO TABS
60.0000 mg | ORAL_TABLET | Freq: Every morning | ORAL | Status: DC
  Administered 2017-01-06: 60 mg via ORAL
  Filled 2017-01-05: qty 2

## 2017-01-05 MED ORDER — ISOSORBIDE DINITRATE 30 MG PO TABS: 30.0000 mg | ORAL_TABLET | Freq: Two times a day (BID) | ORAL | Status: DC

## 2017-01-05 MED ORDER — ONDANSETRON HCL 4 MG/2ML IJ SOLN: 4.0000 mg | Freq: Four times a day (QID) | INTRAMUSCULAR | Status: DC | PRN

## 2017-01-05 MED ORDER — ISOSORBIDE DINITRATE 30 MG PO TABS
30.0000 mg | ORAL_TABLET | Freq: Every evening | ORAL | Status: DC
  Administered 2017-01-05: 30 mg via ORAL
  Filled 2017-01-05 (×2): qty 1

## 2017-01-05 MED ORDER — CARVEDILOL 6.25 MG PO TABS
3.1250 mg | ORAL_TABLET | Freq: Two times a day (BID) | ORAL | Status: DC
  Administered 2017-01-05 – 2017-01-06 (×2): 3.125 mg via ORAL
  Filled 2017-01-05 (×2): qty 1

## 2017-01-05 MED ORDER — DEXTROSE-NACL 5-0.45 % IV SOLN
INTRAVENOUS | Status: DC
  Administered 2017-01-05 – 2017-01-06 (×3): via INTRAVENOUS

## 2017-01-05 MED ORDER — SODIUM POLYSTYRENE SULFONATE 15 GM/60ML PO SUSP
15.0000 g | Freq: Once | ORAL | Status: DC
  Filled 2017-01-05: qty 60

## 2017-01-05 MED ORDER — ACETAMINOPHEN 500 MG PO TABS: 1000.0000 mg | ORAL_TABLET | Freq: Four times a day (QID) | ORAL | Status: DC | PRN

## 2017-01-05 NOTE — ED Notes (Signed)
Confirmed with [place] that pt's medications were left at his house I a food lion bag.

## 2017-01-05 NOTE — Progress Notes (Signed)
01/05/2017  Subjective: Patient admitted with SBO.  NG tube in place.  Patient had bowel movements this morning and is having flatus.  Reports no pain or nausea.  Vital signs: Temp:  [98.1 F (36.7 C)-98.7 F (37.1 C)] 98.7 F (37.1 C) (11/17 0825) Pulse Rate:  [59-109] 88 (11/17 0825) Resp:  [14-24] 20 (11/17 0427) BP: (124-172)/(60-86) 130/69 (11/17 0825) SpO2:  [88 %-100 %] 94 % (11/17 0825) Weight:  [103 kg (227 lb)-103.5 kg (228 lb 3.2 oz)] 103.5 kg (228 lb 3.2 oz) (11/17 0012)   Intake/Output: 11/16 0701 - 11/17 0700 In: 1303 [I.V.:303; IV Piggyback:1000] Out: 4000 [Emesis/NG output:4000] Last BM Date: 01/04/17  Physical Exam: Constitutional: No acute distress Abdomen:  Soft, non distended, nontender to palpation.  Labs:  Recent Labs    01/04/17 2001 01/05/17 0430  WBC 5.4 5.8  HGB 12.4* 11.3*  HCT 39.3* 35.8*  PLT 113* 101*   Recent Labs    01/04/17 2001 01/05/17 0430  NA 138 138  K 5.6* 5.2*  CL 108 110  CO2 23 22  GLUCOSE 139* 191*  BUN 26* 32*  CREATININE 1.53* 1.39*  CALCIUM 9.3 8.1*   No results for input(s): LABPROT, INR in the last 72 hours.  Imaging: Dg Abd 1 View  Result Date: 01/05/2017 CLINICAL DATA:  Abdominal pain.  Small bowel obstruction. EXAM: ABDOMEN - 1 VIEW COMPARISON:  CT 01/04/2017 FINDINGS: NG tube is in the stomach. Decreasing small bowel distention. Prior cholecystectomy. No free air organomegaly. IMPRESSION: Decreasing small bowel distention.  NG tube is in the stomach. Electronically Signed   By: Rolm Baptise M.D.   On: 01/05/2017 09:29   Ct Abdomen Pelvis W Contrast  Addendum Date: 01/04/2017   ADDENDUM REPORT: 01/04/2017 21:32 ADDENDUM: The musculoskeletal section should include the following: Old right rib fractures are noted with nonunion. Electronically Signed   By: Inez Catalina M.D.   On: 01/04/2017 21:32   Result Date: 01/04/2017 CLINICAL DATA:  Abdominal pain EXAM: CT ABDOMEN AND PELVIS WITH CONTRAST TECHNIQUE:  Multidetector CT imaging of the abdomen and pelvis was performed using the standard protocol following bolus administration of intravenous contrast. CONTRAST:  86mL ISOVUE-300 IOPAMIDOL (ISOVUE-300) INJECTION 61% COMPARISON:  10/27/2016 FINDINGS: Lower chest: Lung bases demonstrate no focal infiltrate or sizable effusion. Is few small calcified granulomas are noted. Hepatobiliary: No focal liver abnormality is seen. Status post cholecystectomy. No biliary dilatation. Pancreas: Unremarkable. No pancreatic ductal dilatation or surrounding inflammatory changes. Spleen: Normal in size without focal abnormality. Adrenals/Urinary Tract: The adrenal glands are within normal limits. Kidneys are well visualized bilaterally. Renal cystic changes again noted on the right and stable. No obstructive changes are seen. The bladder is partially decompressed. Stomach/Bowel: The appendix is within normal limits. Stomach is distended with fluid. Proximal small bowel dilatation is identified and extends to the mid ileum. A transition zone is noted in the right mid abdomen best seen on image number 52 of series 2 and image number 23 of series 5. No evidence of perforation is noted. No free fluid is seen. The colon shows mild diverticular change without diverticulitis. Vascular/Lymphatic: Aortic atherosclerosis. No enlarged abdominal or pelvic lymph nodes. Reproductive: Prostate is unremarkable. Other: No abdominal wall hernia or abnormality. No abdominopelvic ascites. Musculoskeletal: No acute or significant osseous findings. IMPRESSION: Small-bowel obstruction secondary to a an area of narrowing in the mid to distal ileum in the right mid abdomen as described. Stable hypodense lesion within the right kidney. It again demonstrates density greater than  that expected for simple cyst. This likely represents a benign hemorrhagic cyst and is stable from the prior exam. Follow-up examination in 3-6 months is recommended to assess for  stability. Previously seen changes in the right lung base have resolved in the interval and were likely postinflammatory in nature. Electronically Signed: By: Inez Catalina M.D. On: 01/04/2017 21:09   Dg Chest Port 1 View  Result Date: 01/04/2017 CLINICAL DATA:  Abdominal pain. EXAM: PORTABLE CHEST 1 VIEW COMPARISON:  None. FINDINGS: There is shallow lung inflation. The heart size and mediastinal contours are within normal limits. Both lungs are clear. The visualized skeletal structures are unremarkable. No free air beneath the hemidiaphragms. IMPRESSION: Shallow lung inflation without focal airspace disease. No free air beneath the hemidiaphragms. Electronically Signed   By: Ulyses Jarred M.D.   On: 01/04/2017 20:36    Assessment/Plan: 66 yo male with SBO, resolving.  --clamp NG tube today and possibly remove given return of bowel function. --if ng removed, will keep NPO today until tonight when clears would start --appreciate medicine team's input   Todd Spencer, [place]

## 2017-01-05 NOTE — Progress Notes (Signed)
[facility] [place] NAME: Todd Spencer    MR#:  [id number]  DATE OF BIRTH:  25-Feb-1950  SUBJECTIVE:  CHIEF COMPLAINT: Patient is feeling better.  Abdominal soft.  NG tube clamped , positive flatus  REVIEW OF SYSTEMS:  CONSTITUTIONAL: No fever, fatigue or weakness.  EYES: No blurred or double vision.  EARS, NOSE, AND THROAT: No tinnitus or ear pain.  RESPIRATORY: No cough, shortness of breath, wheezing or hemoptysis.  CARDIOVASCULAR: No chest pain, orthopnea, edema.  GASTROINTESTINAL: No nausea, vomiting, diarrhea or abdominal pain.  GENITOURINARY: No dysuria, hematuria.  ENDOCRINE: No polyuria, nocturia,  HEMATOLOGY: No anemia, easy bruising or bleeding SKIN: No rash or lesion. MUSCULOSKELETAL: No joint pain or arthritis.   NEUROLOGIC: No tingling, numbness, weakness.  PSYCHIATRY: No anxiety or depression.   DRUG ALLERGIES:  No Known Allergies  VITALS:  Blood pressure 130/69, pulse 88, temperature 98.7 F (37.1 C), temperature source Oral, resp. rate 20, height 5\' 10"  (1.778 m), weight 103.5 kg (228 lb 3.2 oz), SpO2 94 %.  PHYSICAL EXAMINATION:  GENERAL:  66 y.o.-year-old patient lying in the bed with no acute distress.  EYES: Pupils equal, round, reactive to light and accommodation. No scleral icterus. Extraocular muscles intact.  HEENT: Head atraumatic, normocephalic. Oropharynx and nasopharynx clear.  NG tube clamped NECK:  Supple, no jugular venous distention. No thyroid enlargement, no tenderness.  LUNGS: Normal breath sounds bilaterally, no wheezing, rales,rhonchi or crepitation. No use of accessory muscles of respiration.  CARDIOVASCULAR: S1, S2 normal. No murmurs, rubs, or gallops.  ABDOMEN: Soft, nontender, nondistended. Bowel sounds present.  EXTREMITIES: No pedal edema, cyanosis, or clubbing.  NEUROLOGIC: Cranial nerves II through XII are intact. Muscle strength 5/5 in all extremities. Sensation intact. Gait not  checked.  PSYCHIATRIC: The patient is alert and oriented x 3.  SKIN: No obvious rash, lesion, or ulcer.    LABORATORY PANEL:   CBC Recent Labs  Lab 01/05/17 0430  WBC 5.8  HGB 11.3*  HCT 35.8*  PLT 101*   ------------------------------------------------------------------------------------------------------------------  Chemistries  Recent Labs  Lab 01/04/17 2001 01/05/17 0430  NA 138 138  K 5.6* 5.2*  CL 108 110  CO2 23 22  GLUCOSE 139* 191*  BUN 26* 32*  CREATININE 1.53* 1.39*  CALCIUM 9.3 8.1*  AST 21  --   ALT 19  --   ALKPHOS 74  --   BILITOT 0.7  --    ------------------------------------------------------------------------------------------------------------------  Cardiac Enzymes Recent Labs  Lab 01/04/17 2001  TROPONINI <0.03   ------------------------------------------------------------------------------------------------------------------  RADIOLOGY:  Dg Abd 1 View  Result Date: 01/05/2017 CLINICAL DATA:  Abdominal pain.  Small bowel obstruction. EXAM: ABDOMEN - 1 VIEW COMPARISON:  CT 01/04/2017 FINDINGS: NG tube is in the stomach. Decreasing small bowel distention. Prior cholecystectomy. No free air organomegaly. IMPRESSION: Decreasing small bowel distention.  NG tube is in the stomach. Electronically Signed   By: Rolm Baptise M.D.   On: 01/05/2017 09:29   Ct Abdomen Pelvis W Contrast  Addendum Date: 01/04/2017   ADDENDUM REPORT: 01/04/2017 21:32 ADDENDUM: The musculoskeletal section should include the following: Old right rib fractures are noted with nonunion. Electronically Signed   By: Inez Catalina M.D.   On: 01/04/2017 21:32   Result Date: 01/04/2017 CLINICAL DATA:  Abdominal pain EXAM: CT ABDOMEN AND PELVIS WITH CONTRAST TECHNIQUE: Multidetector CT imaging of the abdomen and pelvis was performed using the standard protocol following bolus administration of intravenous contrast. CONTRAST:  70mL ISOVUE-300  IOPAMIDOL (ISOVUE-300) INJECTION 61%  COMPARISON:  10/27/2016 FINDINGS: Lower chest: Lung bases demonstrate no focal infiltrate or sizable effusion. Is few small calcified granulomas are noted. Hepatobiliary: No focal liver abnormality is seen. Status post cholecystectomy. No biliary dilatation. Pancreas: Unremarkable. No pancreatic ductal dilatation or surrounding inflammatory changes. Spleen: Normal in size without focal abnormality. Adrenals/Urinary Tract: The adrenal glands are within normal limits. Kidneys are well visualized bilaterally. Renal cystic changes again noted on the right and stable. No obstructive changes are seen. The bladder is partially decompressed. Stomach/Bowel: The appendix is within normal limits. Stomach is distended with fluid. Proximal small bowel dilatation is identified and extends to the mid ileum. A transition zone is noted in the right mid abdomen best seen on image number 52 of series 2 and image number 23 of series 5. No evidence of perforation is noted. No free fluid is seen. The colon shows mild diverticular change without diverticulitis. Vascular/Lymphatic: Aortic atherosclerosis. No enlarged abdominal or pelvic lymph nodes. Reproductive: Prostate is unremarkable. Other: No abdominal wall hernia or abnormality. No abdominopelvic ascites. Musculoskeletal: No acute or significant osseous findings. IMPRESSION: Small-bowel obstruction secondary to a an area of narrowing in the mid to distal ileum in the right mid abdomen as described. Stable hypodense lesion within the right kidney. It again demonstrates density greater than that expected for simple cyst. This likely represents a benign hemorrhagic cyst and is stable from the prior exam. Follow-up examination in 3-6 months is recommended to assess for stability. Previously seen changes in the right lung base have resolved in the interval and were likely postinflammatory in nature. Electronically Signed: By: Inez Catalina M.D. On: 01/04/2017 21:09   Dg Chest Port 1  View  Result Date: 01/04/2017 CLINICAL DATA:  Abdominal pain. EXAM: PORTABLE CHEST 1 VIEW COMPARISON:  None. FINDINGS: There is shallow lung inflation. The heart size and mediastinal contours are within normal limits. Both lungs are clear. The visualized skeletal structures are unremarkable. No free air beneath the hemidiaphragms. IMPRESSION: Shallow lung inflation without focal airspace disease. No free air beneath the hemidiaphragms. Electronically Signed   By: Ulyses Jarred M.D.   On: 01/04/2017 20:36    EKG:   Orders placed or performed during the hospital encounter of 01/04/17  . ED EKG  . ED EKG  . EKG 12-Lead  . EKG 12-Lead    ASSESSMENT AND PLAN:    #Small bowel obstruction secondary to adhesions Clinically improving Currently n.p.o. NG tube is clamped if clinically improving it will be discontinued today and patient will be started on clear liquid diet Discussed with Dr. Hampton Abbot  #Hyperkalemia Kayexalate and repeat labs  # diabetes mellitus type 2-sliding scale insulin  #Chronic systolic congestive heart failure-currently with no exacerbation, patient is n.p.o.   All the records are reviewed and case discussed with general surgery and RN management plans discussed with the patient, family mom and nephew at bedside and they are in agreement.  CODE STATUS: fc   TOTAL TIME TAKING CARE OF THIS PATIENT: 34  minutes.   POSSIBLE D/C IN 1-2  DAYS, DEPENDING ON CLINICAL CONDITION.  Note: This dictation was prepared with Dragon dictation along with smaller phrase technology. Any transcriptional errors that result from this process are unintentional.   Nicholes Mango M.D on 01/05/2017 at 10:58 AM  Between 7am to 6pm - Pager - [phone] After 6pm go to [email or web address] - password [email or web address] [facility]  Office  [phone]  CC: Primary care physician; [place] Pink,  MD

## 2017-01-06 DIAGNOSIS — K565 Intestinal adhesions [bands], unspecified as to partial versus complete obstruction: Secondary | ICD-10-CM

## 2017-01-06 LAB — BASIC METABOLIC PANEL
Anion gap: 7 (ref 5–15)
BUN: 18 mg/dL (ref 6–20)
CHLORIDE: 107 mmol/L (ref 101–111)
CO2: 23 mmol/L (ref 22–32)
CREATININE: 1.16 mg/dL (ref 0.61–1.24)
Calcium: 8.2 mg/dL — ABNORMAL LOW (ref 8.9–10.3)
GFR calc non Af Amer: >60 mL/min (ref >60)
GLUCOSE: 187 mg/dL — AB (ref 65–99)
Potassium: 3.7 mmol/L (ref 3.5–5.1)
Sodium: 137 mmol/L (ref 135–145)

## 2017-01-06 LAB — CBC
HEMATOCRIT: 32.5 % — AB (ref 40.0–52.0)
HEMOGLOBIN: 10.4 g/dL — AB (ref 13.0–18.0)
MCH: 24.3 pg — AB (ref 26.0–34.0)
MCHC: 32.1 g/dL (ref 32.0–36.0)
MCV: 75.6 fL — AB (ref 80.0–100.0)
Platelets: 95 10*3/uL — ABNORMAL LOW (ref 150–440)
RBC: 4.3 MIL/uL — ABNORMAL LOW (ref 4.40–5.90)
RDW: 18.4 % — ABNORMAL HIGH (ref 11.5–14.5)
WBC: 4.7 10*3/uL (ref 3.8–10.6)

## 2017-01-06 LAB — GLUCOSE, CAPILLARY
GLUCOSE-CAPILLARY: 147 mg/dL — AB (ref 65–99)
GLUCOSE-CAPILLARY: 190 mg/dL — AB (ref 65–99)
GLUCOSE-CAPILLARY: 210 mg/dL — AB (ref 65–99)
Glucose-Capillary: 171 mg/dL — ABNORMAL HIGH (ref 65–99)

## 2017-01-06 LAB — MAGNESIUM: Magnesium: 1.6 mg/dL — ABNORMAL LOW (ref 1.7–2.4)

## 2017-01-06 MED ORDER — MAGNESIUM SULFATE 2 GM/50ML IV SOLN
2.0000 g | Freq: Once | INTRAVENOUS | Status: AC
  Administered 2017-01-06: 2 g via INTRAVENOUS
  Filled 2017-01-06: qty 50

## 2017-01-06 MED ORDER — INSULIN GLARGINE 100 UNIT/ML ~~LOC~~ SOLN: 70.0000 [IU] | Freq: Every day | SUBCUTANEOUS | 0 refills | Status: DC

## 2017-01-06 NOTE — Progress Notes (Addendum)
[facility] [place] NAME: Todd Spencer    MR#:  [id number]  DATE OF BIRTH:  14-Feb-1951  SUBJECTIVE:  CHIEF COMPLAINT: Patient is feeling better.  Abdominal soft.  NG tube removed  REVIEW OF SYSTEMS:  CONSTITUTIONAL: No fever, fatigue or weakness.  EYES: No blurred or double vision.  EARS, NOSE, AND THROAT: No tinnitus or ear pain.  RESPIRATORY: No cough, shortness of breath, wheezing or hemoptysis.  CARDIOVASCULAR: No chest pain, orthopnea, edema.  GASTROINTESTINAL: No nausea, vomiting, diarrhea or abdominal pain.  GENITOURINARY: No dysuria, hematuria.  ENDOCRINE: No polyuria, nocturia,  HEMATOLOGY: No anemia, easy bruising or bleeding SKIN: No rash or lesion. MUSCULOSKELETAL: No joint pain or arthritis.   NEUROLOGIC: No tingling, numbness, weakness.  PSYCHIATRY: No anxiety or depression.   DRUG ALLERGIES:  No Known Allergies  VITALS:  Blood pressure (!) 114/59, pulse (!) 44, temperature (!) 97.4 F (36.3 C), temperature source Oral, resp. rate 19, height 5\' 10"  (1.778 m), weight 103.5 kg (228 lb 3.2 oz), SpO2 97 %.  PHYSICAL EXAMINATION:  GENERAL:  66 y.o.-year-old patient lying in the bed with no acute distress.  EYES: Pupils equal, round, reactive to light and accommodation. No scleral icterus. Extraocular muscles intact.  HEENT: Head atraumatic, normocephalic. Oropharynx and nasopharynx clear.  NG tube clamped NECK:  Supple, no jugular venous distention. No thyroid enlargement, no tenderness.  LUNGS: Normal breath sounds bilaterally, no wheezing, rales,rhonchi or crepitation. No use of accessory muscles of respiration.  CARDIOVASCULAR: S1, S2 normal. No murmurs, rubs, or gallops.  ABDOMEN: Soft, nontender, nondistended. Bowel sounds present.  EXTREMITIES: No pedal edema, cyanosis, or clubbing.  NEUROLOGIC: Cranial nerves II through XII are intact. Muscle strength 5/5 in all extremities. Sensation intact. Gait not  checked.  PSYCHIATRIC: The patient is alert and oriented x 3.  SKIN: No obvious rash, lesion, or ulcer.    LABORATORY PANEL:   CBC Recent Labs  Lab 01/06/17 0425  WBC 4.7  HGB 10.4*  HCT 32.5*  PLT 95*   ------------------------------------------------------------------------------------------------------------------  Chemistries  Recent Labs  Lab 01/04/17 2001  01/06/17 0425  NA 138   < > 137  K 5.6*   < > 3.7  CL 108   < > 107  CO2 23   < > 23  GLUCOSE 139*   < > 187*  BUN 26*   < > 18  CREATININE 1.53*   < > 1.16  CALCIUM 9.3   < > 8.2*  MG  --   --  1.6*  AST 21  --   --   ALT 19  --   --   ALKPHOS 74  --   --   BILITOT 0.7  --   --    < > = values in this interval not displayed.   ------------------------------------------------------------------------------------------------------------------  Cardiac Enzymes Recent Labs  Lab 01/04/17 2001  TROPONINI <0.03   ------------------------------------------------------------------------------------------------------------------  RADIOLOGY:  Dg Abd 1 View  Result Date: 01/05/2017 CLINICAL DATA:  Abdominal pain.  Small bowel obstruction. EXAM: ABDOMEN - 1 VIEW COMPARISON:  CT 01/04/2017 FINDINGS: NG tube is in the stomach. Decreasing small bowel distention. Prior cholecystectomy. No free air organomegaly. IMPRESSION: Decreasing small bowel distention.  NG tube is in the stomach. Electronically Signed   By: Rolm Baptise M.D.   On: 01/05/2017 09:29   Ct Abdomen Pelvis W Contrast  Addendum Date: 01/04/2017   ADDENDUM REPORT: 01/04/2017 21:32 ADDENDUM: The musculoskeletal section should include the  following: Old right rib fractures are noted with nonunion. Electronically Signed   By: Inez Catalina M.D.   On: 01/04/2017 21:32   Result Date: 01/04/2017 CLINICAL DATA:  Abdominal pain EXAM: CT ABDOMEN AND PELVIS WITH CONTRAST TECHNIQUE: Multidetector CT imaging of the abdomen and pelvis was performed using the  standard protocol following bolus administration of intravenous contrast. CONTRAST:  41mL ISOVUE-300 IOPAMIDOL (ISOVUE-300) INJECTION 61% COMPARISON:  10/27/2016 FINDINGS: Lower chest: Lung bases demonstrate no focal infiltrate or sizable effusion. Is few small calcified granulomas are noted. Hepatobiliary: No focal liver abnormality is seen. Status post cholecystectomy. No biliary dilatation. Pancreas: Unremarkable. No pancreatic ductal dilatation or surrounding inflammatory changes. Spleen: Normal in size without focal abnormality. Adrenals/Urinary Tract: The adrenal glands are within normal limits. Kidneys are well visualized bilaterally. Renal cystic changes again noted on the right and stable. No obstructive changes are seen. The bladder is partially decompressed. Stomach/Bowel: The appendix is within normal limits. Stomach is distended with fluid. Proximal small bowel dilatation is identified and extends to the mid ileum. A transition zone is noted in the right mid abdomen best seen on image number 52 of series 2 and image number 23 of series 5. No evidence of perforation is noted. No free fluid is seen. The colon shows mild diverticular change without diverticulitis. Vascular/Lymphatic: Aortic atherosclerosis. No enlarged abdominal or pelvic lymph nodes. Reproductive: Prostate is unremarkable. Other: No abdominal wall hernia or abnormality. No abdominopelvic ascites. Musculoskeletal: No acute or significant osseous findings. IMPRESSION: Small-bowel obstruction secondary to a an area of narrowing in the mid to distal ileum in the right mid abdomen as described. Stable hypodense lesion within the right kidney. It again demonstrates density greater than that expected for simple cyst. This likely represents a benign hemorrhagic cyst and is stable from the prior exam. Follow-up examination in 3-6 months is recommended to assess for stability. Previously seen changes in the right lung base have resolved in the  interval and were likely postinflammatory in nature. Electronically Signed: By: Inez Catalina M.D. On: 01/04/2017 21:09   Dg Chest Port 1 View  Result Date: 01/04/2017 CLINICAL DATA:  Abdominal pain. EXAM: PORTABLE CHEST 1 VIEW COMPARISON:  None. FINDINGS: There is shallow lung inflation. The heart size and mediastinal contours are within normal limits. Both lungs are clear. The visualized skeletal structures are unremarkable. No free air beneath the hemidiaphragms. IMPRESSION: Shallow lung inflation without focal airspace disease. No free air beneath the hemidiaphragms. Electronically Signed   By: Ulyses Jarred M.D.   On: 01/04/2017 20:36    EKG:   Orders placed or performed during the hospital encounter of 01/04/17  . ED EKG  . ED EKG  . EKG 12-Lead  . EKG 12-Lead    ASSESSMENT AND PLAN:    #Small bowel obstruction secondary to adhesions Clinically improving  NG tube is discontinued Diet advanced to soft, possible discharge today Discussed with Dr. Hampton Abbot  #Hyperkalemia Kayexalate and repeat labs potassium 3.7  # diabetes mellitus type 2-sliding scale insulin, resume home med Lantus at 70 units instead of 90 units if patient is tolerating diet at the time of discharge.  Discussed with the patient he is aware  #Chronic systolic congestive heart failure-currently with no exacerbation, patient is on   liquid diet, advancing to soft diet for lunch.  Resume his home meds if patient tolerates diet  Will sign off.  Call us with any questions  All the records are reviewed and case discussed with general surgery and  RN management plans discussed with the patient,  CODE STATUS: fc   TOTAL TIME TAKING CARE OF THIS PATIENT: 34  minutes.   POSSIBLE D/C IN 1DAYS, DEPENDING ON CLINICAL CONDITION.  Note: This dictation was prepared with Dragon dictation along with smaller phrase technology. Any transcriptional errors that result from this process are unintentional.   Nicholes Mango M.D  on 01/06/2017 at 12:02 PM  Between 7am to 6pm - Pager - [phone] After 6pm go to [email or web address] - password [email or web address] [facility]  [facility]  Office  [phone]  CC: Primary care physician; [place] Pink, MD

## 2017-01-06 NOTE — Progress Notes (Signed)
01/06/2017  Subjective: No acute events overnight.  Tolerated clear liquids well and had BM last night and continues with flatus this morning.    Vital signs: Temp:  [97.8 F (36.6 C)-98.3 F (36.8 C)] 97.8 F (36.6 C) (11/18 0414) Pulse Rate:  [64-84] 79 (11/18 0832) Resp:  [18-19] 19 (11/18 0414) BP: (110-155)/(53-86) 155/74 (11/18 0832) SpO2:  [94 %-98 %] 98 % (11/18 0832)   Intake/Output: 11/17 0701 - 11/18 0700 In: 2831.5 [P.O.:760; I.V.:2071.5] Out: 550 [Emesis/NG output:550] Last BM Date: 01/05/17  Physical Exam: Constitutional: No acute distress Abdomen:  Soft, nondistended, nontender to palpation.  Labs:  Recent Labs    01/05/17 0430 01/06/17 0425  WBC 5.8 4.7  HGB 11.3* 10.4*  HCT 35.8* 32.5*  PLT 101* 95*   Recent Labs    01/05/17 0430 01/06/17 0425  NA 138 137  K 5.2* 3.7  CL 110 107  CO2 22 23  GLUCOSE 191* 187*  BUN 32* 18  CREATININE 1.39* 1.16  CALCIUM 8.1* 8.2*   No results for input(s): LABPROT, INR in the last 72 hours.  Imaging: No results found.  Assessment/Plan: 66 yo male with SBO, now resolved  --will advance diet to full liquid for breakfast and soft diet for lunch.  If he tolerates well, then will plan to D/C to home this afternoon.  If any issues, will keep today and plan for discharge tomorrow. --d/c IV fluids   Melvyn Neth, [place]

## 2017-01-06 NOTE — Progress Notes (Signed)
Pt was given transitional instructions and he said that he would follow them and make his follow up appt. With Dr. Kary Kos. His VS were WDL and he was released to a friend. On removal of IV which was 20G he had some extra bleeding which stopped with applied pressure.

## 2017-01-06 NOTE — Discharge Summary (Signed)
Patient ID: Todd Spencer MRN: [id number] DOB/AGE: 1950/12/31 66 y.o.  Admit date: 01/04/2017 Discharge date: 01/06/2017   Discharge Diagnoses:  Principal Problem:   Small bowel obstruction due to adhesions [facility]) Active Problems:   Chronic systolic heart failure (HCC)   Atherosclerotic heart disease of native coronary artery without angina pectoris   CKD (chronic kidney disease) stage 3, GFR 30-59 ml/min (HCC)   DM2 (diabetes mellitus, type 2) ([place])   HTN (hypertension)   Procedures:  None  Hospital Course: Patient was admitted on 11/16 with SBO.  His NG tube was clamped and removed on 11/17 and his diet was slowly advanced.  He was tolerating a regular diet, with bowel function, and no further pain or nausea.  Hospitalist team was consulted for assistance with management of his medical comorbidities. His potassium was initially mildly elevated and responded well to Kayexalate.  His Lantus was changed from 90 units to 70 units upon discharge.  Consults:  Hospitalist  Disposition: 01-Home or Self Care  Discharge Instructions    Call MD for:  difficulty breathing, headache or visual disturbances   Complete by:  As directed    Call MD for:  persistant nausea and vomiting   Complete by:  As directed    Call MD for:  severe uncontrolled pain   Complete by:  As directed    Call MD for:  temperature >100.4   Complete by:  As directed    Diet - low sodium heart healthy   Complete by:  As directed    Discharge instructions   Complete by:  As directed    Call or come to hospital if any nausea, vomiting, worsening abdominal distention or abdominal pain.   Increase activity slowly   Complete by:  As directed      Allergies as of 01/06/2017   No Known Allergies     Medication List    TAKE these medications   acetaminophen 325 MG tablet Commonly known as:  TYLENOL Take 2 tablets (650 mg total) by mouth every 6 (six) hours as needed for mild pain, fever or headache  (headache).   aspirin 81 MG chewable tablet Chew 81 mg by mouth daily.   atorvastatin 40 MG tablet Commonly known as:  LIPITOR Take 40 mg by mouth daily.   carvedilol 3.125 MG tablet Commonly known as:  COREG Take 3.125 mg by mouth 2 (two) times daily with a meal.   diazepam 5 MG tablet Commonly known as:  VALIUM Take 5 mg by mouth every 12 (twelve) hours as needed for anxiety.   donepezil 10 MG tablet Commonly known as:  ARICEPT Take 10 mg by mouth at bedtime.   doxepin 100 MG capsule Commonly known as:  SINEQUAN Take 100-200 mg by mouth 2 (two) times daily. 100 mg in the morning and 200 mg at bedtime   gemfibrozil 600 MG tablet Commonly known as:  LOPID Take 600 mg by mouth 2 (two) times daily before a meal.   guaiFENesin 600 MG 12 hr tablet Commonly known as:  MUCINEX Take 600 mg by mouth 2 (two) times daily as needed for cough or to loosen phlegm.   insulin glargine 100 UNIT/ML injection Commonly known as:  LANTUS Inject 0.7 mLs (70 Units total) daily into the skin. Start taking on:  01/07/2017 What changed:    how much to take  when to take this   insulin regular 100 units/mL injection Commonly known as:  NOVOLIN R,HUMULIN R Inject 20-25 Units  into the skin 2 (two) times daily before a meal. 20 units with breakfast and 25 with supper   isosorbide dinitrate 30 MG tablet Commonly known as:  ISORDIL Take 30-60 mg by mouth 2 (two) times daily. Patient takes 60mg  in the morning and 30mg  in the evening   meclizine 25 MG tablet Commonly known as:  ANTIVERT Take 25 mg by mouth 3 (three) times daily as needed.   metFORMIN 500 MG 24 hr tablet Commonly known as:  GLUCOPHAGE-XR Take 1,500 mg by mouth daily.   multivitamin with minerals Tabs tablet Take 1 tablet by mouth daily.   nitroGLYCERIN 0.4 MG SL tablet Commonly known as:  NITROSTAT Place 0.4 mg under the tongue every 5 (five) minutes as needed.   pregabalin 300 MG capsule Commonly known as:   LYRICA Take 300 mg by mouth 2 (two) times daily.   quinapril 5 MG tablet Commonly known as:  ACCUPRIL Take 5 mg by mouth daily.   ranitidine 150 MG tablet Commonly known as:  ZANTAC Take 150 mg by mouth 2 (two) times daily.      Follow-up Information    [place] Pink, MD Follow up.   Specialty:  Family Medicine Why:  Follow up with your PCP at your earliest convenience for overall health management and medication management. Contact information: [address] [place] [address] [phone]

## 2017-02-06 DIAGNOSIS — R0602 Shortness of breath: Secondary | ICD-10-CM | POA: Diagnosis not present

## 2017-02-06 DIAGNOSIS — R001 Bradycardia, unspecified: Secondary | ICD-10-CM | POA: Diagnosis not present

## 2017-02-06 DIAGNOSIS — I10 Essential (primary) hypertension: Secondary | ICD-10-CM | POA: Diagnosis not present

## 2017-02-06 DIAGNOSIS — I255 Ischemic cardiomyopathy: Secondary | ICD-10-CM | POA: Diagnosis not present

## 2017-02-06 DIAGNOSIS — Z9889 Other specified postprocedural states: Secondary | ICD-10-CM | POA: Diagnosis not present

## 2017-02-06 DIAGNOSIS — E785 Hyperlipidemia, unspecified: Secondary | ICD-10-CM | POA: Diagnosis not present

## 2017-02-11 DIAGNOSIS — G4733 Obstructive sleep apnea (adult) (pediatric): Secondary | ICD-10-CM | POA: Diagnosis not present

## 2017-02-13 ENCOUNTER — Other Ambulatory Visit: Payer: Self-pay

## 2017-02-13 ENCOUNTER — Emergency Department
Admission: EM | Admit: 2017-02-13 | Discharge: 2017-02-13 | Disposition: A | Discharge status: Home / Self Care | Payer: Medicare HMO | Attending: Emergency Medicine | Admitting: Emergency Medicine

## 2017-02-13 ENCOUNTER — Emergency Department: Payer: Medicare HMO

## 2017-02-13 DIAGNOSIS — R109 Unspecified abdominal pain: Secondary | ICD-10-CM | POA: Diagnosis not present

## 2017-02-13 DIAGNOSIS — E1140 Type 2 diabetes mellitus with diabetic neuropathy, unspecified: Secondary | ICD-10-CM | POA: Diagnosis not present

## 2017-02-13 DIAGNOSIS — Z87891 Personal history of nicotine dependence: Secondary | ICD-10-CM | POA: Diagnosis not present

## 2017-02-13 DIAGNOSIS — F0280 Dementia in other diseases classified elsewhere without behavioral disturbance: Secondary | ICD-10-CM | POA: Insufficient documentation

## 2017-02-13 DIAGNOSIS — G309 Alzheimer's disease, unspecified: Secondary | ICD-10-CM | POA: Diagnosis not present

## 2017-02-13 DIAGNOSIS — K5792 Diverticulitis of intestine, part unspecified, without perforation or abscess without bleeding: Secondary | ICD-10-CM | POA: Diagnosis not present

## 2017-02-13 DIAGNOSIS — I2510 Atherosclerotic heart disease of native coronary artery without angina pectoris: Secondary | ICD-10-CM | POA: Insufficient documentation

## 2017-02-13 DIAGNOSIS — I252 Old myocardial infarction: Secondary | ICD-10-CM | POA: Diagnosis not present

## 2017-02-13 DIAGNOSIS — I130 Hypertensive heart and chronic kidney disease with heart failure and stage 1 through stage 4 chronic kidney disease, or unspecified chronic kidney disease: Secondary | ICD-10-CM | POA: Insufficient documentation

## 2017-02-13 DIAGNOSIS — N183 Chronic kidney disease, stage 3 (moderate): Secondary | ICD-10-CM | POA: Diagnosis not present

## 2017-02-13 DIAGNOSIS — I5022 Chronic systolic (congestive) heart failure: Secondary | ICD-10-CM | POA: Diagnosis not present

## 2017-02-13 DIAGNOSIS — K5730 Diverticulosis of large intestine without perforation or abscess without bleeding: Secondary | ICD-10-CM | POA: Diagnosis not present

## 2017-02-13 DIAGNOSIS — E1122 Type 2 diabetes mellitus with diabetic chronic kidney disease: Secondary | ICD-10-CM | POA: Insufficient documentation

## 2017-02-13 LAB — CBC WITH DIFFERENTIAL/PLATELET
Basophils Absolute: 0 10*3/uL (ref 0–0.1)
Basophils Relative: 0 %
Eosinophils Absolute: 0.1 10*3/uL (ref 0–0.7)
Eosinophils Relative: 1 %
HCT: 33.3 % — ABNORMAL LOW (ref 40.0–52.0)
HEMOGLOBIN: 11 g/dL — AB (ref 13.0–18.0)
LYMPHS ABS: 1 10*3/uL (ref 1.0–3.6)
Lymphocytes Relative: 10 %
MCH: 24.9 pg — AB (ref 26.0–34.0)
MCHC: 33 g/dL (ref 32.0–36.0)
MCV: 75.6 fL — ABNORMAL LOW (ref 80.0–100.0)
MONOS PCT: 14 %
Monocytes Absolute: 1.4 10*3/uL — ABNORMAL HIGH (ref 0.2–1.0)
NEUTROS ABS: 7.7 10*3/uL — AB (ref 1.4–6.5)
NEUTROS PCT: 75 %
Platelets: 131 10*3/uL — ABNORMAL LOW (ref 150–440)
RBC: 4.41 MIL/uL (ref 4.40–5.90)
RDW: 18.7 % — ABNORMAL HIGH (ref 11.5–14.5)
WBC: 10.2 10*3/uL (ref 3.8–10.6)

## 2017-02-13 LAB — COMPREHENSIVE METABOLIC PANEL
ALK PHOS: 57 U/L (ref 38–126)
ALT: 15 U/L — AB (ref 17–63)
ANION GAP: 6 (ref 5–15)
AST: 18 U/L (ref 15–41)
Albumin: 3.9 g/dL (ref 3.5–5.0)
BILIRUBIN TOTAL: 0.7 mg/dL (ref 0.3–1.2)
BUN: 27 mg/dL — ABNORMAL HIGH (ref 6–20)
CALCIUM: 8.9 mg/dL (ref 8.9–10.3)
CO2: 25 mmol/L (ref 22–32)
CREATININE: 1.65 mg/dL — AB (ref 0.61–1.24)
Chloride: 103 mmol/L (ref 101–111)
GFR calc non Af Amer: 41 mL/min — ABNORMAL LOW (ref >60)
GFR, EST AFRICAN AMERICAN: 48 mL/min — AB (ref >60)
GLUCOSE: 179 mg/dL — AB (ref 65–99)
Potassium: 4.7 mmol/L (ref 3.5–5.1)
SODIUM: 134 mmol/L — AB (ref 135–145)
TOTAL PROTEIN: 7.2 g/dL (ref 6.5–8.1)

## 2017-02-13 LAB — LIPASE, BLOOD: Lipase: 51 U/L (ref 11–51)

## 2017-02-13 LAB — ETHANOL

## 2017-02-13 MED ORDER — IOPAMIDOL (ISOVUE-300) INJECTION 61%
75.0000 mL | Freq: Once | INTRAVENOUS | Status: AC | PRN
  Administered 2017-02-13: 75 mL via INTRAVENOUS

## 2017-02-13 MED ORDER — OXYCODONE-ACETAMINOPHEN 5-325 MG PO TABS: 1.0000 | ORAL_TABLET | Freq: Three times a day (TID) | ORAL | 0 refills | Status: DC | PRN

## 2017-02-13 MED ORDER — METRONIDAZOLE 500 MG PO TABS: 500.0000 mg | ORAL_TABLET | Freq: Three times a day (TID) | ORAL | 0 refills | Status: DC

## 2017-02-13 MED ORDER — PIPERACILLIN-TAZOBACTAM 3.375 G IVPB 30 MIN
3.3750 g | Freq: Once | INTRAVENOUS | Status: AC
  Administered 2017-02-13: 3.375 g via INTRAVENOUS
  Filled 2017-02-13: qty 50

## 2017-02-13 MED ORDER — MORPHINE SULFATE (PF) 4 MG/ML IV SOLN
4.0000 mg | Freq: Once | INTRAVENOUS | Status: AC
  Administered 2017-02-13: 4 mg via INTRAVENOUS
  Filled 2017-02-13: qty 1

## 2017-02-13 MED ORDER — ONDANSETRON HCL 4 MG/2ML IJ SOLN
4.0000 mg | Freq: Once | INTRAMUSCULAR | Status: AC
  Administered 2017-02-13: 4 mg via INTRAVENOUS
  Filled 2017-02-13: qty 2

## 2017-02-13 MED ORDER — SODIUM CHLORIDE 0.9 % IV SOLN
1000.0000 mL | Freq: Once | INTRAVENOUS | Status: AC
  Administered 2017-02-13: 1000 mL via INTRAVENOUS

## 2017-02-13 MED ORDER — CIPROFLOXACIN HCL 500 MG PO TABS: 500.0000 mg | ORAL_TABLET | Freq: Two times a day (BID) | ORAL | 0 refills | Status: AC
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
## 2017-02-13 NOTE — ED Notes (Signed)
The EKG was exported and signed by Dr. Jimmye Norman. The EKG was also exported into the system.

## 2017-02-13 NOTE — ED Triage Notes (Signed)
Pt c/o LLQ pain since yesterday,. Denies N/V/D. Pt states he has a hx of bowel obstruction and took a laxative last night and had a large BM with no relief from abd pain.Marland Kitchen

## 2017-02-14 DIAGNOSIS — E1122 Type 2 diabetes mellitus with diabetic chronic kidney disease: Secondary | ICD-10-CM | POA: Diagnosis not present

## 2017-02-14 DIAGNOSIS — Z794 Long term (current) use of insulin: Secondary | ICD-10-CM | POA: Diagnosis not present

## 2017-02-14 DIAGNOSIS — N183 Chronic kidney disease, stage 3 (moderate): Secondary | ICD-10-CM | POA: Diagnosis not present

## 2017-02-14 DIAGNOSIS — E1159 Type 2 diabetes mellitus with other circulatory complications: Secondary | ICD-10-CM | POA: Diagnosis not present

## 2017-02-14 DIAGNOSIS — Z8719 Personal history of other diseases of the digestive system: Secondary | ICD-10-CM | POA: Diagnosis not present

## 2017-02-14 DIAGNOSIS — E1142 Type 2 diabetes mellitus with diabetic polyneuropathy: Secondary | ICD-10-CM | POA: Diagnosis not present

## 2017-02-14 DIAGNOSIS — E113293 Type 2 diabetes mellitus with mild nonproliferative diabetic retinopathy without macular edema, bilateral: Secondary | ICD-10-CM | POA: Diagnosis not present

## 2017-02-18 DIAGNOSIS — E875 Hyperkalemia: Secondary | ICD-10-CM | POA: Diagnosis not present

## 2017-03-12 DIAGNOSIS — E118 Type 2 diabetes mellitus with unspecified complications: Secondary | ICD-10-CM | POA: Diagnosis not present

## 2017-03-12 DIAGNOSIS — N183 Chronic kidney disease, stage 3 (moderate): Secondary | ICD-10-CM | POA: Diagnosis not present

## 2017-03-12 DIAGNOSIS — E1122 Type 2 diabetes mellitus with diabetic chronic kidney disease: Secondary | ICD-10-CM | POA: Diagnosis not present

## 2017-04-09 DIAGNOSIS — E1142 Type 2 diabetes mellitus with diabetic polyneuropathy: Secondary | ICD-10-CM | POA: Diagnosis not present

## 2017-04-09 DIAGNOSIS — I10 Essential (primary) hypertension: Secondary | ICD-10-CM | POA: Diagnosis not present

## 2017-04-09 DIAGNOSIS — R001 Bradycardia, unspecified: Secondary | ICD-10-CM | POA: Diagnosis not present

## 2017-04-09 DIAGNOSIS — I509 Heart failure, unspecified: Secondary | ICD-10-CM | POA: Diagnosis not present

## 2017-04-09 DIAGNOSIS — H6123 Impacted cerumen, bilateral: Secondary | ICD-10-CM | POA: Diagnosis not present

## 2017-04-09 DIAGNOSIS — N183 Chronic kidney disease, stage 3 (moderate): Secondary | ICD-10-CM | POA: Diagnosis not present

## 2017-04-09 DIAGNOSIS — E0822 Diabetes mellitus due to underlying condition with diabetic chronic kidney disease: Secondary | ICD-10-CM | POA: Diagnosis not present

## 2017-04-09 DIAGNOSIS — J069 Acute upper respiratory infection, unspecified: Secondary | ICD-10-CM | POA: Diagnosis not present

## 2017-04-09 DIAGNOSIS — E1159 Type 2 diabetes mellitus with other circulatory complications: Secondary | ICD-10-CM | POA: Diagnosis not present

## 2017-04-09 DIAGNOSIS — N529 Male erectile dysfunction, unspecified: Secondary | ICD-10-CM | POA: Diagnosis not present

## 2017-04-09 DIAGNOSIS — R05 Cough: Secondary | ICD-10-CM | POA: Diagnosis not present

## 2017-05-09 DIAGNOSIS — R05 Cough: Secondary | ICD-10-CM | POA: Diagnosis not present

## 2017-05-17 DIAGNOSIS — Z794 Long term (current) use of insulin: Secondary | ICD-10-CM | POA: Diagnosis not present

## 2017-05-17 DIAGNOSIS — N183 Chronic kidney disease, stage 3 (moderate): Secondary | ICD-10-CM | POA: Diagnosis not present

## 2017-05-17 DIAGNOSIS — E1122 Type 2 diabetes mellitus with diabetic chronic kidney disease: Secondary | ICD-10-CM | POA: Diagnosis not present

## 2017-05-24 DIAGNOSIS — E1165 Type 2 diabetes mellitus with hyperglycemia: Secondary | ICD-10-CM | POA: Diagnosis not present

## 2017-05-24 DIAGNOSIS — E1142 Type 2 diabetes mellitus with diabetic polyneuropathy: Secondary | ICD-10-CM | POA: Diagnosis not present

## 2017-05-24 DIAGNOSIS — E113293 Type 2 diabetes mellitus with mild nonproliferative diabetic retinopathy without macular edema, bilateral: Secondary | ICD-10-CM | POA: Diagnosis not present

## 2017-05-24 DIAGNOSIS — E1159 Type 2 diabetes mellitus with other circulatory complications: Secondary | ICD-10-CM | POA: Diagnosis not present

## 2017-05-24 DIAGNOSIS — Z794 Long term (current) use of insulin: Secondary | ICD-10-CM | POA: Diagnosis not present

## 2017-06-03 DIAGNOSIS — E1165 Type 2 diabetes mellitus with hyperglycemia: Secondary | ICD-10-CM | POA: Diagnosis not present

## 2017-06-03 DIAGNOSIS — G4733 Obstructive sleep apnea (adult) (pediatric): Secondary | ICD-10-CM | POA: Diagnosis not present

## 2017-06-03 DIAGNOSIS — E669 Obesity, unspecified: Secondary | ICD-10-CM | POA: Diagnosis not present

## 2017-06-03 DIAGNOSIS — R0602 Shortness of breath: Secondary | ICD-10-CM | POA: Diagnosis not present

## 2017-06-03 DIAGNOSIS — I10 Essential (primary) hypertension: Secondary | ICD-10-CM | POA: Diagnosis not present

## 2017-06-03 DIAGNOSIS — I219 Acute myocardial infarction, unspecified: Secondary | ICD-10-CM | POA: Diagnosis not present

## 2017-06-03 DIAGNOSIS — E1122 Type 2 diabetes mellitus with diabetic chronic kidney disease: Secondary | ICD-10-CM | POA: Diagnosis not present

## 2017-06-03 DIAGNOSIS — E1159 Type 2 diabetes mellitus with other circulatory complications: Secondary | ICD-10-CM | POA: Diagnosis not present

## 2017-06-03 DIAGNOSIS — I255 Ischemic cardiomyopathy: Secondary | ICD-10-CM | POA: Diagnosis not present

## 2017-07-03 DIAGNOSIS — J069 Acute upper respiratory infection, unspecified: Secondary | ICD-10-CM | POA: Diagnosis not present

## 2017-07-03 DIAGNOSIS — E1165 Type 2 diabetes mellitus with hyperglycemia: Secondary | ICD-10-CM | POA: Diagnosis not present

## 2017-07-03 DIAGNOSIS — Z794 Long term (current) use of insulin: Secondary | ICD-10-CM | POA: Diagnosis not present

## 2017-08-12 DIAGNOSIS — M1712 Unilateral primary osteoarthritis, left knee: Secondary | ICD-10-CM | POA: Diagnosis not present

## 2017-08-21 ENCOUNTER — Other Ambulatory Visit: Payer: Self-pay | Admitting: Orthopedic Surgery

## 2017-08-21 DIAGNOSIS — E1165 Type 2 diabetes mellitus with hyperglycemia: Secondary | ICD-10-CM | POA: Diagnosis not present

## 2017-08-21 DIAGNOSIS — Z794 Long term (current) use of insulin: Secondary | ICD-10-CM | POA: Diagnosis not present

## 2017-08-21 DIAGNOSIS — M1712 Unilateral primary osteoarthritis, left knee: Secondary | ICD-10-CM | POA: Diagnosis not present

## 2017-08-29 DIAGNOSIS — N183 Chronic kidney disease, stage 3 (moderate): Secondary | ICD-10-CM | POA: Diagnosis not present

## 2017-08-29 DIAGNOSIS — Z794 Long term (current) use of insulin: Secondary | ICD-10-CM | POA: Diagnosis not present

## 2017-08-29 DIAGNOSIS — E1142 Type 2 diabetes mellitus with diabetic polyneuropathy: Secondary | ICD-10-CM | POA: Diagnosis not present

## 2017-08-29 DIAGNOSIS — E113293 Type 2 diabetes mellitus with mild nonproliferative diabetic retinopathy without macular edema, bilateral: Secondary | ICD-10-CM | POA: Diagnosis not present

## 2017-08-29 DIAGNOSIS — E1122 Type 2 diabetes mellitus with diabetic chronic kidney disease: Secondary | ICD-10-CM | POA: Diagnosis not present

## 2017-08-29 DIAGNOSIS — E1165 Type 2 diabetes mellitus with hyperglycemia: Secondary | ICD-10-CM | POA: Diagnosis not present

## 2017-08-29 DIAGNOSIS — E1159 Type 2 diabetes mellitus with other circulatory complications: Secondary | ICD-10-CM | POA: Diagnosis not present

## 2017-09-03 ENCOUNTER — Ambulatory Visit
Admission: RE | Admit: 2017-09-03 | Discharge: 2017-09-03 | Disposition: A | Discharge status: Home / Self Care | Payer: Medicare HMO | Source: Ambulatory Visit | Attending: Orthopedic Surgery | Admitting: Orthopedic Surgery

## 2017-09-03 DIAGNOSIS — M1712 Unilateral primary osteoarthritis, left knee: Secondary | ICD-10-CM | POA: Diagnosis not present

## 2017-09-25 ENCOUNTER — Other Ambulatory Visit: Payer: Self-pay

## 2017-09-25 ENCOUNTER — Encounter
Admission: RE | Admit: 2017-09-25 | Discharge: 2017-09-25 | Disposition: A | Discharge status: Home / Self Care | Payer: Medicare HMO | Source: Ambulatory Visit | Attending: Orthopedic Surgery | Admitting: Orthopedic Surgery

## 2017-09-25 DIAGNOSIS — M1712 Unilateral primary osteoarthritis, left knee: Secondary | ICD-10-CM | POA: Insufficient documentation

## 2017-09-25 DIAGNOSIS — Z01818 Encounter for other preprocedural examination: Secondary | ICD-10-CM | POA: Insufficient documentation

## 2017-09-25 DIAGNOSIS — R001 Bradycardia, unspecified: Secondary | ICD-10-CM | POA: Insufficient documentation

## 2017-09-25 DIAGNOSIS — I4589 Other specified conduction disorders: Secondary | ICD-10-CM | POA: Diagnosis not present

## 2017-09-25 DIAGNOSIS — I10 Essential (primary) hypertension: Secondary | ICD-10-CM | POA: Insufficient documentation

## 2017-09-25 DIAGNOSIS — E119 Type 2 diabetes mellitus without complications: Secondary | ICD-10-CM | POA: Diagnosis not present

## 2017-09-25 HISTORY — DX: Accidental discharge from unspecified firearms or gun, initial encounter: W34.00XA

## 2017-09-25 LAB — PROTIME-INR
INR: 0.99
Prothrombin Time: 13 seconds (ref 11.4–15.2)

## 2017-09-25 LAB — URINALYSIS, ROUTINE W REFLEX MICROSCOPIC
BILIRUBIN URINE: NEGATIVE
Bacteria, UA: NONE SEEN
GLUCOSE, UA: 50 mg/dL — AB
Hgb urine dipstick: NEGATIVE
Ketones, ur: NEGATIVE mg/dL
LEUKOCYTES UA: NEGATIVE
Nitrite: NEGATIVE
PH: 5 (ref 5.0–8.0)
Protein, ur: 30 mg/dL — AB
Specific Gravity, Urine: 1.028 (ref 1.005–1.030)
Squamous Epithelial / LPF: NONE SEEN (ref 0–5)

## 2017-09-25 LAB — CBC
HEMATOCRIT: 33.6 % — AB (ref 40.0–52.0)
Hemoglobin: 11 g/dL — ABNORMAL LOW (ref 13.0–18.0)
MCH: 24.8 pg — ABNORMAL LOW (ref 26.0–34.0)
MCHC: 32.7 g/dL (ref 32.0–36.0)
MCV: 76 fL — AB (ref 80.0–100.0)
Platelets: 127 10*3/uL — ABNORMAL LOW (ref 150–440)
RBC: 4.42 MIL/uL (ref 4.40–5.90)
RDW: 18.3 % — ABNORMAL HIGH (ref 11.5–14.5)
WBC: 6.4 10*3/uL (ref 3.8–10.6)

## 2017-09-25 LAB — TYPE AND SCREEN
ABO/RH(D): A POS
Antibody Screen: NEGATIVE

## 2017-09-25 LAB — BASIC METABOLIC PANEL
Anion gap: 7 (ref 5–15)
BUN: 23 mg/dL (ref 8–23)
CO2: 28 mmol/L (ref 22–32)
Calcium: 9.1 mg/dL (ref 8.9–10.3)
Chloride: 106 mmol/L (ref 98–111)
Creatinine, Ser: 1.38 mg/dL — ABNORMAL HIGH (ref 0.61–1.24)
GFR calc Af Amer: 59 mL/min — ABNORMAL LOW (ref >60)
GFR calc non Af Amer: 51 mL/min — ABNORMAL LOW (ref >60)
GLUCOSE: 250 mg/dL — AB (ref 70–99)
POTASSIUM: 4 mmol/L (ref 3.5–5.1)
Sodium: 141 mmol/L (ref 135–145)

## 2017-09-25 LAB — SURGICAL PCR SCREEN
MRSA, PCR: NEGATIVE
Staphylococcus aureus: NEGATIVE

## 2017-09-25 LAB — SEDIMENTATION RATE: Sed Rate: 15 mm/hr (ref 0–20)

## 2017-09-25 LAB — APTT: APTT: 30 s (ref 24–36)

## 2017-09-25 NOTE — Pre-Procedure Instructions (Signed)
EKG'S REVIEWED BY DR Rosey Bath AND OK TO PROCEED

## 2017-09-25 NOTE — Pre-Procedure Instructions (Signed)
Abnormal urinalysis result faxed to Dr Rudene Christians office

## 2017-09-25 NOTE — Patient Instructions (Signed)
Your procedure is scheduled on: October 08, 2017 TUESDAY Report to Day Surgery on the 2nd floor of the [facility]. To find out your arrival time, please call [phone] between 1PM - 3PM on: October 07, 2017 MONDAY  REMEMBER: Instructions that are not followed completely may result in serious medical risk, up to and including death; or upon the discretion of your surgeon and anesthesiologist your surgery may need to be rescheduled.  Do not eat food after midnight the night before surgery.  No gum chewing, lozengers or hard candies.  You may however, drink CLEAR liquids up to 2 hours before you are scheduled to arrive for your surgery. Do not drink anything within 2 hours of the start of your surgery.  Clear liquids include: - water   Type 1 and Type 2 diabetics should only drink water.     No Alcohol for 24 hours before or after surgery.  No Smoking including e-cigarettes for 24 hours prior to surgery.  No chewable tobacco products for at least 6 hours prior to surgery.  No nicotine patches on the day of surgery.  On the morning of surgery brush your teeth with toothpaste and water, you may rinse your mouth with mouthwash if you wish. Do not swallow any toothpaste or mouthwash.  Notify your doctor if there is any change in your medical condition (cold, fever, infection).  Do not wear jewelry, make-up, hairpins, clips or nail polish.  Do not wear lotions, powders, or perfumes. You may wear deodorant.  Do not shave 48 hours prior to surgery. Men may shave face and neck.  Contacts and dentures may not be worn into surgery.  Do not bring valuables to the hospital, including drivers license, insurance or credit cards.  Todd Spencer is not responsible for any belongings or valuables.   TAKE THESE MEDICATIONS THE MORNING OF SURGERY: CARVEDILOL LIPITOR GEMFIBROZIL ISOSORBIDE MECLIZINE DOXEPIN LYRICA   Use CHG Soap  as directed on instruction sheet.  Bring your C-PAP to  the hospital with you in case you may have to spend the night.   Stop Metformin 2 days prior to surgery. LAST DOSE October 05, 2017  Take 1/2 of usual insulin dose the night before surgery and none on the morning of surgery.  Follow recommendations from Cardiologist, Pulmonologist or PCP regarding stopping Aspirin  Stop Anti-inflammatories (NSAIDS) such as Advil, Aleve, Ibuprofen, Motrin, Naproxen, Naprosyn and Aspirin based products such as Excedrin, Goodys Powder, BC Powder. (May take Tylenol or Acetaminophen if needed.)  Stop ANY OVER THE COUNTER supplements until after surgery. (May continue Vitamin D, Vitamin B, and multivitamin.)  Wear comfortable clothing (specific to your surgery type) to the hospital.  Plan for stool softeners for home use.  If you are being admitted to the hospital overnight, leave your suitcase in the car. After surgery it may be brought to your room.  If you are being discharged the day of surgery, you will not be allowed to drive home. You will need a responsible adult to drive you home and stay with you that night.   If you are taking public transportation, you will need to have a responsible adult with you. Please confirm with your physician that it is acceptable to use public transportation.   Please call [phone] if you have any questions about these instructions.

## 2017-09-26 LAB — URINE CULTURE: Culture: NO GROWTH

## 2017-10-07 MED ORDER — CEFAZOLIN SODIUM-DEXTROSE 2-4 GM/100ML-% IV SOLN
2.0000 g | Freq: Once | INTRAVENOUS | Status: AC
  Administered 2017-10-08: 2 g via INTRAVENOUS

## 2017-10-08 ENCOUNTER — Other Ambulatory Visit: Payer: Self-pay

## 2017-10-08 ENCOUNTER — Encounter: Payer: Self-pay | Admitting: *Deleted

## 2017-10-08 ENCOUNTER — Inpatient Hospital Stay: Payer: Medicare HMO

## 2017-10-08 ENCOUNTER — Encounter
Admission: RE | Disposition: A | Discharge status: Skilled Nursing Facility | Payer: Self-pay | Source: Home / Self Care | Attending: Orthopedic Surgery

## 2017-10-08 ENCOUNTER — Inpatient Hospital Stay
Admission: RE | Admit: 2017-10-08 | Discharge: 2017-10-12 | DRG: 469 | Disposition: A | Discharge status: Skilled Nursing Facility | Payer: Medicare HMO | Attending: Internal Medicine | Admitting: Internal Medicine

## 2017-10-08 DIAGNOSIS — K567 Ileus, unspecified: Secondary | ICD-10-CM | POA: Diagnosis not present

## 2017-10-08 DIAGNOSIS — R531 Weakness: Secondary | ICD-10-CM | POA: Diagnosis not present

## 2017-10-08 DIAGNOSIS — Z8249 Family history of ischemic heart disease and other diseases of the circulatory system: Secondary | ICD-10-CM | POA: Diagnosis not present

## 2017-10-08 DIAGNOSIS — G8918 Other acute postprocedural pain: Secondary | ICD-10-CM

## 2017-10-08 DIAGNOSIS — E1142 Type 2 diabetes mellitus with diabetic polyneuropathy: Secondary | ICD-10-CM | POA: Diagnosis present

## 2017-10-08 DIAGNOSIS — I252 Old myocardial infarction: Secondary | ICD-10-CM

## 2017-10-08 DIAGNOSIS — H9190 Unspecified hearing loss, unspecified ear: Secondary | ICD-10-CM | POA: Diagnosis present

## 2017-10-08 DIAGNOSIS — Z955 Presence of coronary angioplasty implant and graft: Secondary | ICD-10-CM | POA: Diagnosis not present

## 2017-10-08 DIAGNOSIS — I2510 Atherosclerotic heart disease of native coronary artery without angina pectoris: Secondary | ICD-10-CM | POA: Diagnosis present

## 2017-10-08 DIAGNOSIS — G4733 Obstructive sleep apnea (adult) (pediatric): Secondary | ICD-10-CM | POA: Diagnosis present

## 2017-10-08 DIAGNOSIS — Z87891 Personal history of nicotine dependence: Secondary | ICD-10-CM | POA: Diagnosis not present

## 2017-10-08 DIAGNOSIS — E1122 Type 2 diabetes mellitus with diabetic chronic kidney disease: Secondary | ICD-10-CM | POA: Diagnosis not present

## 2017-10-08 DIAGNOSIS — Z7401 Bed confinement status: Secondary | ICD-10-CM | POA: Diagnosis not present

## 2017-10-08 DIAGNOSIS — E1140 Type 2 diabetes mellitus with diabetic neuropathy, unspecified: Secondary | ICD-10-CM | POA: Diagnosis not present

## 2017-10-08 DIAGNOSIS — E669 Obesity, unspecified: Secondary | ICD-10-CM | POA: Diagnosis present

## 2017-10-08 DIAGNOSIS — E785 Hyperlipidemia, unspecified: Secondary | ICD-10-CM | POA: Diagnosis not present

## 2017-10-08 DIAGNOSIS — Z96652 Presence of left artificial knee joint: Secondary | ICD-10-CM

## 2017-10-08 DIAGNOSIS — R140 Abdominal distension (gaseous): Secondary | ICD-10-CM | POA: Diagnosis not present

## 2017-10-08 DIAGNOSIS — J9601 Acute respiratory failure with hypoxia: Secondary | ICD-10-CM | POA: Diagnosis not present

## 2017-10-08 DIAGNOSIS — N183 Chronic kidney disease, stage 3 (moderate): Secondary | ICD-10-CM | POA: Diagnosis present

## 2017-10-08 DIAGNOSIS — Z79899 Other long term (current) drug therapy: Secondary | ICD-10-CM

## 2017-10-08 DIAGNOSIS — Z951 Presence of aortocoronary bypass graft: Secondary | ICD-10-CM | POA: Diagnosis not present

## 2017-10-08 DIAGNOSIS — Z7982 Long term (current) use of aspirin: Secondary | ICD-10-CM | POA: Diagnosis not present

## 2017-10-08 DIAGNOSIS — Z6832 Body mass index (BMI) 32.0-32.9, adult: Secondary | ICD-10-CM

## 2017-10-08 DIAGNOSIS — R4182 Altered mental status, unspecified: Secondary | ICD-10-CM | POA: Diagnosis not present

## 2017-10-08 DIAGNOSIS — I255 Ischemic cardiomyopathy: Secondary | ICD-10-CM | POA: Diagnosis present

## 2017-10-08 DIAGNOSIS — I130 Hypertensive heart and chronic kidney disease with heart failure and stage 1 through stage 4 chronic kidney disease, or unspecified chronic kidney disease: Secondary | ICD-10-CM | POA: Diagnosis present

## 2017-10-08 DIAGNOSIS — Z794 Long term (current) use of insulin: Secondary | ICD-10-CM | POA: Diagnosis not present

## 2017-10-08 DIAGNOSIS — F0390 Unspecified dementia without behavioral disturbance: Secondary | ICD-10-CM | POA: Diagnosis present

## 2017-10-08 DIAGNOSIS — F329 Major depressive disorder, single episode, unspecified: Secondary | ICD-10-CM | POA: Diagnosis not present

## 2017-10-08 DIAGNOSIS — G92 Toxic encephalopathy: Secondary | ICD-10-CM | POA: Diagnosis not present

## 2017-10-08 DIAGNOSIS — M1712 Unilateral primary osteoarthritis, left knee: Secondary | ICD-10-CM | POA: Diagnosis not present

## 2017-10-08 DIAGNOSIS — Z471 Aftercare following joint replacement surgery: Secondary | ICD-10-CM | POA: Diagnosis not present

## 2017-10-08 DIAGNOSIS — D638 Anemia in other chronic diseases classified elsewhere: Secondary | ICD-10-CM | POA: Diagnosis present

## 2017-10-08 DIAGNOSIS — I5022 Chronic systolic (congestive) heart failure: Secondary | ICD-10-CM | POA: Diagnosis not present

## 2017-10-08 DIAGNOSIS — Z96659 Presence of unspecified artificial knee joint: Secondary | ICD-10-CM | POA: Diagnosis not present

## 2017-10-08 DIAGNOSIS — R0603 Acute respiratory distress: Secondary | ICD-10-CM | POA: Diagnosis not present

## 2017-10-08 DIAGNOSIS — I10 Essential (primary) hypertension: Secondary | ICD-10-CM | POA: Diagnosis not present

## 2017-10-08 DIAGNOSIS — T40605A Adverse effect of unspecified narcotics, initial encounter: Secondary | ICD-10-CM | POA: Diagnosis not present

## 2017-10-08 HISTORY — PX: TOTAL KNEE ARTHROPLASTY: SHX125

## 2017-10-08 LAB — GLUCOSE, CAPILLARY
GLUCOSE-CAPILLARY: 230 mg/dL — AB (ref 70–99)
GLUCOSE-CAPILLARY: 238 mg/dL — AB (ref 70–99)
Glucose-Capillary: 194 mg/dL — ABNORMAL HIGH (ref 70–99)
Glucose-Capillary: 217 mg/dL — ABNORMAL HIGH (ref 70–99)

## 2017-10-08 LAB — ABO/RH: ABO/RH(D): A POS

## 2017-10-08 SURGERY — ARTHROPLASTY, KNEE, TOTAL
Anesthesia: Spinal | Laterality: Left

## 2017-10-08 MED ORDER — TRAMADOL HCL 50 MG PO TABS
50.0000 mg | ORAL_TABLET | Freq: Four times a day (QID) | ORAL | Status: DC
  Administered 2017-10-08 – 2017-10-10 (×5): 50 mg via ORAL
  Filled 2017-10-08 (×6): qty 1

## 2017-10-08 MED ORDER — BUPIVACAINE-EPINEPHRINE (PF) 0.25% -1:200000 IJ SOLN
INTRAMUSCULAR | Status: AC
  Filled 2017-10-08: qty 30

## 2017-10-08 MED ORDER — MAGNESIUM HYDROXIDE 400 MG/5ML PO SUSP
15.0000 mL | Freq: Every day | ORAL | Status: DC
  Administered 2017-10-08 – 2017-10-09 (×2): 15 mL via ORAL
  Filled 2017-10-08 (×2): qty 30

## 2017-10-08 MED ORDER — ONDANSETRON HCL 4 MG/2ML IJ SOLN
INTRAMUSCULAR | Status: DC | PRN
  Administered 2017-10-08: 4 mg via INTRAVENOUS

## 2017-10-08 MED ORDER — TRANEXAMIC ACID 1000 MG/10ML IV SOLN
1000.0000 mg | INTRAVENOUS | Status: AC
  Administered 2017-10-08: 1000 mg via INTRAVENOUS
  Filled 2017-10-08: qty 1000

## 2017-10-08 MED ORDER — BISACODYL 5 MG PO TBEC
5.0000 mg | DELAYED_RELEASE_TABLET | Freq: Every day | ORAL | Status: DC | PRN
  Administered 2017-10-10: 5 mg via ORAL
  Filled 2017-10-08: qty 1

## 2017-10-08 MED ORDER — CEFAZOLIN SODIUM-DEXTROSE 2-4 GM/100ML-% IV SOLN
2.0000 g | Freq: Four times a day (QID) | INTRAVENOUS | Status: AC
  Administered 2017-10-08 (×2): 2 g via INTRAVENOUS
  Filled 2017-10-08 (×2): qty 100

## 2017-10-08 MED ORDER — MENTHOL 3 MG MT LOZG
1.0000 | LOZENGE | OROMUCOSAL | Status: DC | PRN
  Filled 2017-10-08: qty 9

## 2017-10-08 MED ORDER — ONDANSETRON HCL 4 MG/2ML IJ SOLN: 4.0000 mg | Freq: Four times a day (QID) | INTRAMUSCULAR | Status: DC | PRN

## 2017-10-08 MED ORDER — DIAZEPAM 5 MG PO TABS: 5.0000 mg | ORAL_TABLET | Freq: Two times a day (BID) | ORAL | Status: DC | PRN

## 2017-10-08 MED ORDER — DOXEPIN HCL 100 MG PO CAPS
100.0000 mg | ORAL_CAPSULE | Freq: Every day | ORAL | Status: DC
  Administered 2017-10-10: 100 mg via ORAL
  Filled 2017-10-08 (×2): qty 1

## 2017-10-08 MED ORDER — PROPOFOL 500 MG/50ML IV EMUL
INTRAVENOUS | Status: DC | PRN
  Administered 2017-10-08: 75 ug/kg/min via INTRAVENOUS

## 2017-10-08 MED ORDER — INSULIN ASPART 100 UNIT/ML ~~LOC~~ SOLN
0.0000 [IU] | Freq: Three times a day (TID) | SUBCUTANEOUS | Status: DC
  Administered 2017-10-08 – 2017-10-09 (×2): 5 [IU] via SUBCUTANEOUS
  Administered 2017-10-09: 2 [IU] via SUBCUTANEOUS
  Administered 2017-10-10 (×2): 3 [IU] via SUBCUTANEOUS
  Administered 2017-10-11 (×2): 5 [IU] via SUBCUTANEOUS
  Administered 2017-10-12 (×2): 3 [IU] via SUBCUTANEOUS
  Filled 2017-10-08 (×9): qty 1

## 2017-10-08 MED ORDER — PREGABALIN 75 MG PO CAPS
300.0000 mg | ORAL_CAPSULE | Freq: Two times a day (BID) | ORAL | Status: DC
  Administered 2017-10-08 – 2017-10-10 (×3): 300 mg via ORAL
  Filled 2017-10-08 (×4): qty 4

## 2017-10-08 MED ORDER — MAGNESIUM CITRATE PO SOLN
1.0000 | Freq: Once | ORAL | Status: DC | PRN
  Filled 2017-10-08: qty 296

## 2017-10-08 MED ORDER — ZOLPIDEM TARTRATE 5 MG PO TABS: 5.0000 mg | ORAL_TABLET | Freq: Every evening | ORAL | Status: DC | PRN

## 2017-10-08 MED ORDER — METHOCARBAMOL 500 MG PO TABS
500.0000 mg | ORAL_TABLET | Freq: Four times a day (QID) | ORAL | Status: DC | PRN
  Administered 2017-10-10: 500 mg via ORAL
  Filled 2017-10-08: qty 1

## 2017-10-08 MED ORDER — GLYCOPYRROLATE 0.2 MG/ML IJ SOLN
0.2000 mg | Freq: Once | INTRAMUSCULAR | Status: AC
  Administered 2017-10-08: 0.2 mg via INTRAVENOUS

## 2017-10-08 MED ORDER — MORPHINE SULFATE (PF) 10 MG/ML IV SOLN
INTRAVENOUS | Status: AC
  Filled 2017-10-08: qty 1

## 2017-10-08 MED ORDER — FAMOTIDINE 20 MG PO TABS
ORAL_TABLET | ORAL | Status: AC
  Filled 2017-10-08: qty 1

## 2017-10-08 MED ORDER — CARVEDILOL 3.125 MG PO TABS
3.1250 mg | ORAL_TABLET | Freq: Two times a day (BID) | ORAL | Status: DC
  Administered 2017-10-09 – 2017-10-12 (×7): 3.125 mg via ORAL
  Filled 2017-10-08 (×7): qty 1

## 2017-10-08 MED ORDER — ONDANSETRON HCL 4 MG/2ML IJ SOLN: 4.0000 mg | Freq: Once | INTRAMUSCULAR | Status: DC | PRN

## 2017-10-08 MED ORDER — HYDROCODONE-ACETAMINOPHEN 5-325 MG PO TABS
1.0000 | ORAL_TABLET | ORAL | Status: DC | PRN
  Administered 2017-10-08: 1 via ORAL
  Administered 2017-10-09: 2 via ORAL
  Administered 2017-10-09: 1 via ORAL
  Filled 2017-10-08: qty 1
  Filled 2017-10-08: qty 2
  Filled 2017-10-08: qty 1

## 2017-10-08 MED ORDER — INSULIN GLARGINE 100 UNIT/ML ~~LOC~~ SOLN
100.0000 [IU] | Freq: Every day | SUBCUTANEOUS | Status: DC
  Administered 2017-10-08 – 2017-10-11 (×2): 100 [IU] via SUBCUTANEOUS
  Filled 2017-10-08 (×5): qty 1

## 2017-10-08 MED ORDER — METFORMIN HCL ER 750 MG PO TB24
1500.0000 mg | ORAL_TABLET | Freq: Every day | ORAL | Status: DC
  Administered 2017-10-09 – 2017-10-11 (×3): 1500 mg via ORAL
  Filled 2017-10-08 (×6): qty 2

## 2017-10-08 MED ORDER — BUPIVACAINE LIPOSOME 1.3 % IJ SUSP
INTRAMUSCULAR | Status: AC
  Filled 2017-10-08: qty 20

## 2017-10-08 MED ORDER — GEMFIBROZIL 600 MG PO TABS
600.0000 mg | ORAL_TABLET | Freq: Two times a day (BID) | ORAL | Status: DC
  Administered 2017-10-09 – 2017-10-12 (×7): 600 mg via ORAL
  Filled 2017-10-08 (×9): qty 1

## 2017-10-08 MED ORDER — BUPIVACAINE HCL (PF) 0.5 % IJ SOLN
INTRAMUSCULAR | Status: DC | PRN
  Administered 2017-10-08: 3 mL via INTRATHECAL

## 2017-10-08 MED ORDER — DIPHENHYDRAMINE HCL 12.5 MG/5ML PO ELIX: 12.5000 mg | ORAL_SOLUTION | ORAL | Status: DC | PRN

## 2017-10-08 MED ORDER — ACETAMINOPHEN 500 MG PO TABS
500.0000 mg | ORAL_TABLET | Freq: Four times a day (QID) | ORAL | Status: AC
  Administered 2017-10-08 – 2017-10-09 (×2): 500 mg via ORAL
  Filled 2017-10-08 (×3): qty 1

## 2017-10-08 MED ORDER — SODIUM CHLORIDE 0.9 % IV SOLN
INTRAVENOUS | Status: DC
  Administered 2017-10-08: 07:00:00 via INTRAVENOUS

## 2017-10-08 MED ORDER — METHOCARBAMOL 1000 MG/10ML IJ SOLN
500.0000 mg | Freq: Four times a day (QID) | INTRAVENOUS | Status: DC | PRN
  Filled 2017-10-08: qty 5

## 2017-10-08 MED ORDER — ISOSORBIDE DINITRATE 30 MG PO TABS
30.0000 mg | ORAL_TABLET | Freq: Three times a day (TID) | ORAL | Status: DC
  Administered 2017-10-08 – 2017-10-12 (×10): 30 mg via ORAL
  Filled 2017-10-08 (×14): qty 1

## 2017-10-08 MED ORDER — ACETAMINOPHEN 325 MG PO TABS
325.0000 mg | ORAL_TABLET | Freq: Four times a day (QID) | ORAL | Status: DC | PRN
  Administered 2017-10-09 – 2017-10-11 (×4): 650 mg via ORAL
  Filled 2017-10-08 (×4): qty 2

## 2017-10-08 MED ORDER — MORPHINE SULFATE (PF) 2 MG/ML IV SOLN: 0.5000 mg | INTRAVENOUS | Status: DC | PRN

## 2017-10-08 MED ORDER — METOCLOPRAMIDE HCL 10 MG PO TABS: 5.0000 mg | ORAL_TABLET | Freq: Three times a day (TID) | ORAL | Status: DC | PRN

## 2017-10-08 MED ORDER — SODIUM CHLORIDE FLUSH 0.9 % IV SOLN
INTRAVENOUS | Status: AC
  Filled 2017-10-08: qty 40

## 2017-10-08 MED ORDER — ATORVASTATIN CALCIUM 20 MG PO TABS
40.0000 mg | ORAL_TABLET | Freq: Every day | ORAL | Status: DC
  Administered 2017-10-08 – 2017-10-11 (×4): 40 mg via ORAL
  Filled 2017-10-08 (×4): qty 2

## 2017-10-08 MED ORDER — MIDAZOLAM HCL 5 MG/5ML IJ SOLN
INTRAMUSCULAR | Status: DC | PRN
  Administered 2017-10-08 (×2): 1 mg via INTRAVENOUS

## 2017-10-08 MED ORDER — PROPOFOL 10 MG/ML IV BOLUS
INTRAVENOUS | Status: AC
  Filled 2017-10-08: qty 20

## 2017-10-08 MED ORDER — MONTELUKAST SODIUM 10 MG PO TABS
10.0000 mg | ORAL_TABLET | Freq: Every day | ORAL | Status: DC
  Administered 2017-10-09 – 2017-10-12 (×4): 10 mg via ORAL
  Filled 2017-10-08 (×4): qty 1

## 2017-10-08 MED ORDER — MIDAZOLAM HCL 2 MG/2ML IJ SOLN
INTRAMUSCULAR | Status: AC
  Filled 2017-10-08: qty 2

## 2017-10-08 MED ORDER — ONDANSETRON HCL 4 MG PO TABS
4.0000 mg | ORAL_TABLET | Freq: Four times a day (QID) | ORAL | Status: DC | PRN
  Administered 2017-10-08: 4 mg via ORAL
  Filled 2017-10-08: qty 1

## 2017-10-08 MED ORDER — DOXEPIN HCL 100 MG PO CAPS
200.0000 mg | ORAL_CAPSULE | Freq: Every day | ORAL | Status: DC
  Administered 2017-10-08 – 2017-10-09 (×2): 200 mg via ORAL
  Filled 2017-10-08 (×3): qty 2

## 2017-10-08 MED ORDER — FENTANYL CITRATE (PF) 100 MCG/2ML IJ SOLN
INTRAMUSCULAR | Status: DC | PRN
  Administered 2017-10-08: 100 ug via INTRAVENOUS

## 2017-10-08 MED ORDER — PHENOL 1.4 % MT LIQD
1.0000 | OROMUCOSAL | Status: DC | PRN
  Filled 2017-10-08: qty 177

## 2017-10-08 MED ORDER — SODIUM CHLORIDE 0.9 % IV SOLN
INTRAVENOUS | Status: DC
  Administered 2017-10-08 – 2017-10-09 (×3): via INTRAVENOUS

## 2017-10-08 MED ORDER — CEFAZOLIN SODIUM-DEXTROSE 2-4 GM/100ML-% IV SOLN
INTRAVENOUS | Status: AC
  Filled 2017-10-08: qty 100

## 2017-10-08 MED ORDER — DOCUSATE SODIUM 100 MG PO CAPS
100.0000 mg | ORAL_CAPSULE | Freq: Two times a day (BID) | ORAL | Status: DC
  Administered 2017-10-08 – 2017-10-11 (×6): 100 mg via ORAL
  Filled 2017-10-08 (×6): qty 1

## 2017-10-08 MED ORDER — GLYCOPYRROLATE 0.2 MG/ML IJ SOLN
INTRAMUSCULAR | Status: AC
  Administered 2017-10-08: 0.2 mg via INTRAVENOUS
  Filled 2017-10-08: qty 1

## 2017-10-08 MED ORDER — BUPIVACAINE-EPINEPHRINE (PF) 0.25% -1:200000 IJ SOLN
INTRAMUSCULAR | Status: DC | PRN
  Administered 2017-10-08: 30 mL via PERINEURAL

## 2017-10-08 MED ORDER — HYDROCODONE-ACETAMINOPHEN 7.5-325 MG PO TABS: 1.0000 | ORAL_TABLET | ORAL | Status: DC | PRN

## 2017-10-08 MED ORDER — BUPIVACAINE HCL (PF) 0.5 % IJ SOLN
INTRAMUSCULAR | Status: AC
  Filled 2017-10-08: qty 10

## 2017-10-08 MED ORDER — ALUM & MAG HYDROXIDE-SIMETH 200-200-20 MG/5ML PO SUSP
30.0000 mL | ORAL | Status: DC | PRN
  Administered 2017-10-12: 30 mL via ORAL
  Filled 2017-10-08: qty 30

## 2017-10-08 MED ORDER — ONDANSETRON HCL 4 MG/2ML IJ SOLN
INTRAMUSCULAR | Status: AC
  Filled 2017-10-08: qty 2

## 2017-10-08 MED ORDER — NITROGLYCERIN 0.4 MG SL SUBL: 0.4000 mg | SUBLINGUAL_TABLET | SUBLINGUAL | Status: DC | PRN

## 2017-10-08 MED ORDER — FAMOTIDINE 20 MG PO TABS
20.0000 mg | ORAL_TABLET | Freq: Every day | ORAL | Status: DC
  Administered 2017-10-09 – 2017-10-12 (×4): 20 mg via ORAL
  Filled 2017-10-08 (×4): qty 1

## 2017-10-08 MED ORDER — ADULT MULTIVITAMIN W/MINERALS CH
1.0000 | ORAL_TABLET | Freq: Every day | ORAL | Status: DC
  Administered 2017-10-10 – 2017-10-11 (×2): 1 via ORAL
  Filled 2017-10-08 (×3): qty 1

## 2017-10-08 MED ORDER — ASPIRIN EC 325 MG PO TBEC
325.0000 mg | DELAYED_RELEASE_TABLET | Freq: Every day | ORAL | Status: DC
  Administered 2017-10-09 – 2017-10-12 (×4): 325 mg via ORAL
  Filled 2017-10-08 (×4): qty 1

## 2017-10-08 MED ORDER — SODIUM CHLORIDE 0.9 % IJ SOLN
INTRAMUSCULAR | Status: DC | PRN
  Administered 2017-10-08: 40 mL via INTRAVENOUS

## 2017-10-08 MED ORDER — SENNOSIDES-DOCUSATE SODIUM 8.6-50 MG PO TABS: 1.0000 | ORAL_TABLET | Freq: Every evening | ORAL | Status: DC | PRN

## 2017-10-08 MED ORDER — MORPHINE SULFATE 10 MG/ML IJ SOLN
INTRAMUSCULAR | Status: DC | PRN
  Administered 2017-10-08: 10 mg via INTRAVENOUS

## 2017-10-08 MED ORDER — METOCLOPRAMIDE HCL 5 MG/ML IJ SOLN: 5.0000 mg | Freq: Three times a day (TID) | INTRAMUSCULAR | Status: DC | PRN

## 2017-10-08 MED ORDER — NEOMYCIN-POLYMYXIN B GU 40-200000 IR SOLN
Status: AC
  Filled 2017-10-08: qty 20

## 2017-10-08 MED ORDER — NEOMYCIN-POLYMYXIN B GU 40-200000 IR SOLN
Status: DC | PRN
  Administered 2017-10-08: 4 mL

## 2017-10-08 MED ORDER — FENTANYL CITRATE (PF) 100 MCG/2ML IJ SOLN: 25.0000 ug | INTRAMUSCULAR | Status: DC | PRN

## 2017-10-08 MED ORDER — MECLIZINE HCL 25 MG PO TABS
25.0000 mg | ORAL_TABLET | Freq: Two times a day (BID) | ORAL | Status: DC
Start: 2017-10-08 — End: 2017-10-11
  Administered 2017-10-08 – 2017-10-11 (×5): 25 mg via ORAL
  Filled 2017-10-08 (×7): qty 1

## 2017-10-08 MED ORDER — FAMOTIDINE 20 MG PO TABS
20.0000 mg | ORAL_TABLET | Freq: Once | ORAL | Status: AC
  Administered 2017-10-08: 20 mg via ORAL

## 2017-10-08 MED ORDER — SODIUM CHLORIDE 0.9 % IV SOLN
INTRAVENOUS | Status: DC | PRN
  Administered 2017-10-08: 40 mL

## 2017-10-08 MED ORDER — FENTANYL CITRATE (PF) 100 MCG/2ML IJ SOLN
INTRAMUSCULAR | Status: AC
  Filled 2017-10-08: qty 2

## 2017-10-08 MED ORDER — PROPOFOL 500 MG/50ML IV EMUL
INTRAVENOUS | Status: AC
  Filled 2017-10-08: qty 50

## 2017-10-08 MED ORDER — INSULIN ASPART 100 UNIT/ML ~~LOC~~ SOLN
40.0000 [IU] | Freq: Three times a day (TID) | SUBCUTANEOUS | Status: DC
  Administered 2017-10-09 – 2017-10-11 (×8): 40 [IU] via SUBCUTANEOUS
  Filled 2017-10-08 (×8): qty 1

## 2017-10-08 SURGICAL SUPPLY — 68 items
BANDAGE ACE 6X5 VEL STRL LF (GAUZE/BANDAGES/DRESSINGS) ×2 IMPLANT
BLADE SAW 1 (BLADE) ×2 IMPLANT
BLOCK CUTTING FEMUR 5+ LT MED (MISCELLANEOUS) ×1 IMPLANT
BLOCK CUTTING TIBIAL 4 MED (MISCELLANEOUS) ×1 IMPLANT
BLOCK CUTTING TIBIAL 5 LT (MISCELLANEOUS) ×1 IMPLANT
CANISTER SUCT 1200ML W/VALVE (MISCELLANEOUS) ×2 IMPLANT
CANISTER SUCT 3000ML PPV (MISCELLANEOUS) ×4 IMPLANT
CEMENT HV SMART SET (Cement) ×4 IMPLANT
CHLORAPREP W/TINT 26ML (MISCELLANEOUS) ×4 IMPLANT
COMP FEMORAL LT SZ 5P SPHERE (Femur) ×1 IMPLANT
COOLER POLAR GLACIER W/PUMP (MISCELLANEOUS) ×2 IMPLANT
CUFF TOURN 24 STER (MISCELLANEOUS) IMPLANT
CUFF TOURN 30 STER DUAL PORT (MISCELLANEOUS) IMPLANT
DRAPE SHEET LG 3/4 BI-LAMINATE (DRAPES) ×4 IMPLANT
ELECT CAUTERY BLADE 6.4 (BLADE) ×2 IMPLANT
ELECT REM PT RETURN 9FT ADLT (ELECTROSURGICAL) ×2
ELECTRODE REM PT RTRN 9FT ADLT (ELECTROSURGICAL) ×1 IMPLANT
FEMUR BONE MODEL 4.9010 MEDACT (MISCELLANEOUS) ×1 IMPLANT
GAUZE PETRO XEROFOAM 1X8 (MISCELLANEOUS) ×2 IMPLANT
GAUZE SPONGE 4X4 12PLY STRL (GAUZE/BANDAGES/DRESSINGS) ×2 IMPLANT
GLOVE BIOGEL PI IND STRL 9 (GLOVE) ×1 IMPLANT
GLOVE BIOGEL PI INDICATOR 9 (GLOVE) ×1
GLOVE INDICATOR 8.0 STRL GRN (GLOVE) ×2 IMPLANT
GLOVE SURG ORTHO 8.0 STRL STRW (GLOVE) ×2 IMPLANT
GLOVE SURG SYN 9.0  PF PI (GLOVE) ×1
GLOVE SURG SYN 9.0 PF PI (GLOVE) ×1 IMPLANT
GOWN SRG 2XL LVL 4 RGLN SLV (GOWNS) ×1 IMPLANT
GOWN STRL NON-REIN 2XL LVL4 (GOWNS) ×2
GOWN STRL REUS W/ TWL LRG LVL3 (GOWN DISPOSABLE) ×1 IMPLANT
GOWN STRL REUS W/ TWL XL LVL3 (GOWN DISPOSABLE) ×1 IMPLANT
GOWN STRL REUS W/TWL LRG LVL3 (GOWN DISPOSABLE) ×2
GOWN STRL REUS W/TWL XL LVL3 (GOWN DISPOSABLE) ×2
HOLDER FOLEY CATH W/STRAP (MISCELLANEOUS) ×2 IMPLANT
HOOD PEEL AWAY FLYTE STAYCOOL (MISCELLANEOUS) ×4 IMPLANT
INSERT TIBIAL FIXED SZ5 LEFT (Insert) ×1 IMPLANT
KIT TURNOVER KIT A (KITS) ×2 IMPLANT
KNIFE SCULPS 14X20 (INSTRUMENTS) ×2 IMPLANT
NDL SAFETY ECLIPSE 18X1.5 (NEEDLE) ×1 IMPLANT
NDL SPNL 18GX3.5 QUINCKE PK (NEEDLE) ×1 IMPLANT
NDL SPNL 20GX3.5 QUINCKE YW (NEEDLE) ×1 IMPLANT
NEEDLE HYPO 18GX1.5 SHARP (NEEDLE) ×2
NEEDLE SPNL 18GX3.5 QUINCKE PK (NEEDLE) ×2 IMPLANT
NEEDLE SPNL 20GX3.5 QUINCKE YW (NEEDLE) ×2 IMPLANT
NS IRRIG 1000ML POUR BTL (IV SOLUTION) ×2 IMPLANT
PACK TOTAL KNEE (MISCELLANEOUS) ×2 IMPLANT
PAD WRAPON POLAR KNEE (MISCELLANEOUS) ×1 IMPLANT
PATELLA SZ4 CEMENTED (Joint) ×1 IMPLANT
PULSAVAC PLUS IRRIG FAN TIP (DISPOSABLE) ×2
SCALPEL PROTECTED #10 DISP (BLADE) ×4 IMPLANT
SOL .9 NS 3000ML IRR  AL (IV SOLUTION) ×1
SOL .9 NS 3000ML IRR AL (IV SOLUTION) ×1
SOL .9 NS 3000ML IRR UROMATIC (IV SOLUTION) ×1 IMPLANT
STAPLER SKIN PROX 35W (STAPLE) ×2 IMPLANT
STEM EXTENSION 11MMX30MM (Stem) ×1 IMPLANT
SUCTION FRAZIER HANDLE 10FR (MISCELLANEOUS) ×1
SUCTION TUBE FRAZIER 10FR DISP (MISCELLANEOUS) ×1 IMPLANT
SUT DVC 2 QUILL PDO  T11 36X36 (SUTURE) ×1
SUT DVC 2 QUILL PDO T11 36X36 (SUTURE) ×1 IMPLANT
SUT V-LOC 90 ABS DVC 3-0 CL (SUTURE) ×2 IMPLANT
SYR 20CC LL (SYRINGE) ×2 IMPLANT
SYR 50ML LL SCALE MARK (SYRINGE) ×4 IMPLANT
TIB TRAY FIXED SZ5 L (Joint) ×1 IMPLANT
TIBIAL BONE MODEL LEFT (MISCELLANEOUS) ×1 IMPLANT
TIP FAN IRRIG PULSAVAC PLUS (DISPOSABLE) ×1 IMPLANT
TOWEL OR 17X26 4PK STRL BLUE (TOWEL DISPOSABLE) ×2 IMPLANT
TOWER CARTRIDGE SMART MIX (DISPOSABLE) ×2 IMPLANT
TRAY FOLEY MTR SLVR 16FR STAT (SET/KITS/TRAYS/PACK) ×2 IMPLANT
WRAPON POLAR PAD KNEE (MISCELLANEOUS) ×2

## 2017-10-08 NOTE — Anesthesia Preprocedure Evaluation (Addendum)
Anesthesia Evaluation  Patient identified by MRN, date of birth, ID band Patient awake    Reviewed: Allergy & Precautions, NPO status , Patient's Chart, lab work & pertinent test results, reviewed documented beta blocker date and time   Airway Mallampati: III  TM Distance: >3 FB Neck ROM: Limited    Dental  (+) Teeth Intact   Pulmonary sleep apnea , former smoker,    Pulmonary exam normal        Cardiovascular Exercise Tolerance: Poor hypertension, Pt. on medications and Pt. on home beta blockers + angina with exertion + CAD, + Past MI, + Cardiac Stents, + CABG and +CHF  Normal cardiovascular exam     Neuro/Psych PSYCHIATRIC DISORDERS Depression Dementia  Neuromuscular disease    GI/Hepatic hiatal hernia,   Endo/Other  diabetes, Poorly Controlled, Type 2  Renal/GU Renal InsufficiencyRenal disease     Musculoskeletal   Abdominal (+) + obese,   Peds  Hematology   Anesthesia Other Findings Patient saw Dr. Josefa Half 4 months ago and is doing well after stents with an EF of 45-50%  Reproductive/Obstetrics                           Anesthesia Physical  Anesthesia Plan  ASA: III  Anesthesia Plan: Spinal   Post-op Pain Management:    Induction: Intravenous  PONV Risk Score and Plan:   Airway Management Planned: Nasal Cannula  Additional Equipment:   Intra-op Plan:   Post-operative Plan:   Informed Consent: I have reviewed the patients History and Physical, chart, labs and discussed the procedure including the risks, benefits and alternatives for the proposed anesthesia with the patient or authorized representative who has indicated his/her understanding and acceptance.     Plan Discussed with: CRNA  Anesthesia Plan Comments: (BG 265, CABG, stent CHF, EF35% and schedule for ICD/pacemaker.)        Anesthesia Quick Evaluation

## 2017-10-08 NOTE — Anesthesia Post-op Follow-up Note (Signed)
Anesthesia QCDR form completed.        

## 2017-10-08 NOTE — H&P (Signed)
Reviewed paper H+P, will be scanned into chart. No changes noted.  

## 2017-10-08 NOTE — Progress Notes (Signed)
Patient alert and oriented x 4. Verbalizes pain 4/10.  Patient able to ambulate with walker. Patient complains of nausea. PO PRN Zofran given and effective. Patient not tolerating Bone foam very well. Education provided with understanding.

## 2017-10-08 NOTE — Transfer of Care (Signed)
Immediate Anesthesia Transfer of Care Note  Patient: Todd Spencer  Procedure(s) Performed: TOTAL KNEE ARTHROPLASTY (Left )  Patient Location: PACU  Anesthesia Type:Spinal  Level of Consciousness: awake and alert   Airway & Oxygen Therapy: Patient Spontanous Breathing and Patient connected to face mask oxygen  Post-op Assessment: Report given to RN and Post -op Vital signs reviewed and stable  Post vital signs: Reviewed  Last Vitals:  Vitals Value Taken Time  BP 138/65 10/08/2017 11:40 AM  Temp 36.6 C 10/08/2017 11:38 AM  Pulse 61 10/08/2017 11:40 AM  Resp 11 10/08/2017 11:40 AM  SpO2 98 % 10/08/2017 11:40 AM    Last Pain:  Vitals:   10/08/17 0624  TempSrc: Oral  PainSc: 4          Complications: No apparent anesthesia complications

## 2017-10-08 NOTE — Anesthesia Procedure Notes (Signed)
Spinal  Patient location during procedure: OR Staffing Anesthesiologist: Alvin Critchley, MD Resident/CRNA: Rolla Plate, CRNA Other anesthesia staff: Faylene Kurtz, RN Performed: other anesthesia staff  Preanesthetic Checklist Completed: patient identified, site marked, surgical consent, pre-op evaluation, timeout performed, IV checked, risks and benefits discussed and monitors and equipment checked Spinal Block Patient position: sitting Prep: ChloraPrep and site prepped and draped Patient monitoring: heart rate, continuous pulse ox, blood pressure and cardiac monitor Approach: midline Location: L4-5 Injection technique: single-shot Needle Needle type: Whitacre and Introducer  Needle gauge: 24 G Needle length: 9 cm Additional Notes Negative paresthesia. Negative blood return. Positive free-flowing CSF. Expiration date of kit checked and confirmed. Patient tolerated procedure well, without complications.

## 2017-10-08 NOTE — NC FL2 (Signed)
[place] LEVEL OF CARE SCREENING TOOL     IDENTIFICATION  Patient Name: Todd Spencer Birthdate: 1950-05-30 Sex: male Admission Date (Current Location): 10/08/2017  [place] and [place] Number:  Engineering geologist and Address:  [facility], [address], [place], [place] [address]      Provider Number: [id number]  Attending Physician Name and Address:  Hessie Knows, MD  Relative Name and Phone Number:       Current Level of Care: Hospital Recommended Level of Care: [place] Prior Approval Number:    Date Approved/Denied:   PASRR Number: [id number])  Discharge Plan: SNF    Current Diagnoses: Patient Active Problem List   Diagnosis Date Noted  . Status post total knee replacement using cement, left 10/08/2017  . Small bowel obstruction ([place]) 10/27/2016  . Insulin overdose 01/22/2016  . CKD (chronic kidney disease) stage 3, GFR 30-59 ml/min (HCC) 12/12/2015  . Diabetic peripheral neuropathy ([place]) 12/12/2015  . DM2 (diabetes mellitus, type 2) ([place]) 12/12/2015  . Small bowel obstruction due to adhesions ([place]) 12/12/2015  . Intestinal adhesions with obstruction ([place])   . Obesity, Class I, BMI 30-34.9 05/11/2015  . Mixed Alzheimer's and vascular dementia 05/04/2015  . Uncontrolled diabetes mellitus type 2 without complications ([place]) 70/35/0093  . Cardiomyopathy, ischemic 11/04/2014  . Chronic systolic heart failure ([place]) 11/04/2014  . Atherosclerotic heart disease of native coronary artery without angina pectoris 11/04/2014  . SOB (shortness of breath) on exertion 04/26/2014  . Cardiomyopathy ([place]) 04/23/2014  . H/O cardiac catheterization 04/23/2014  . H/O degenerative disc disease 04/23/2014  . History of PTCA 04/23/2014  . HTN (hypertension) 04/23/2014  . Hyperlipidemia, unspecified 04/23/2014  . MI (myocardial infarction) ([place]) 04/23/2014  . OSA (obstructive sleep apnea) 04/23/2014    Orientation  RESPIRATION BLADDER Height & Weight     Self, Time, Situation, Place  Normal Continent Weight: 236 lb 8 oz (107.3 kg) Height:  5\' 11"  (180.3 cm)  BEHAVIORAL SYMPTOMS/MOOD NEUROLOGICAL BOWEL NUTRITION STATUS      Continent Diet(Diet: Carb Modified. )  AMBULATORY STATUS COMMUNICATION OF NEEDS Skin   Extensive Assist Verbally Surgical wounds(Incision: Left Knee. )                       Personal Care Assistance Level of Assistance  Bathing, Feeding, Dressing Bathing Assistance: Limited assistance Feeding assistance: Independent Dressing Assistance: Limited assistance     Functional Limitations Info  Sight, Hearing, Speech Sight Info: Adequate Hearing Info: Adequate Speech Info: Adequate    SPECIAL CARE FACTORS FREQUENCY  PT (By licensed PT), OT (By licensed OT)     PT Frequency: (5) OT Frequency: (5)            Contractures      Additional Factors Info  Code Status, Allergies Code Status Info: (Full Code. ) Allergies Info: (No Known Allergies. )           Current Medications (10/08/2017):  This is the current hospital active medication list Current Facility-Administered Medications  Medication Dose Route Frequency Provider Last Rate Last Dose  . 0.9 %  sodium chloride infusion   Intravenous Continuous Hessie Knows, MD 75 mL/hr at 10/08/17 1337    . [START ON 10/09/2017] acetaminophen (TYLENOL) tablet 325-650 mg  325-650 mg Oral Q6H PRN Hessie Knows, MD      . acetaminophen (TYLENOL) tablet 500 mg  500 mg Oral Q6H Hessie Knows, MD      .  alum & mag hydroxide-simeth (MAALOX/MYLANTA) 200-200-20 MG/5ML suspension 30 mL  30 mL Oral Q4H PRN Hessie Knows, MD      . Derrill Memo ON 10/09/2017] aspirin EC tablet 325 mg  325 mg Oral Q breakfast Hessie Knows, MD      . atorvastatin (LIPITOR) tablet 40 mg  40 mg Oral q1800 Hessie Knows, MD      . bisacodyl (DULCOLAX) EC tablet 5 mg  5 mg Oral Daily PRN Hessie Knows, MD      . carvedilol (COREG) tablet 3.125 mg  3.125 mg  Oral BID WC Hessie Knows, MD      . ceFAZolin (ANCEF) IVPB 2g/100 mL premix  2 g Intravenous Q6H Hessie Knows, MD      . diazepam (VALIUM) tablet 5 mg  5 mg Oral Q12H PRN Hessie Knows, MD      . diphenhydrAMINE (BENADRYL) 12.5 MG/5ML elixir 12.5-25 mg  12.5-25 mg Oral Q4H PRN Hessie Knows, MD      . docusate sodium (COLACE) capsule 100 mg  100 mg Oral BID Hessie Knows, MD      . Derrill Memo ON 10/09/2017] doxepin (SINEQUAN) capsule 100 mg  100 mg Oral Daily Hessie Knows, MD      . doxepin (SINEQUAN) capsule 200 mg  200 mg Oral QHS Hessie Knows, MD      . famotidine (PEPCID) 20 MG tablet           . famotidine (PEPCID) tablet 20 mg  20 mg Oral Daily Hessie Knows, MD      . gemfibrozil (LOPID) tablet 600 mg  600 mg Oral BID AC Hessie Knows, MD      . HYDROcodone-acetaminophen [facility]) 7.5-325 MG per tablet 1-2 tablet  1-2 tablet Oral Q4H PRN Hessie Knows, MD      . HYDROcodone-acetaminophen (NORCO/VICODIN) 5-325 MG per tablet 1-2 tablet  1-2 tablet Oral Q4H PRN Hessie Knows, MD   1 tablet at 10/08/17 1530  . insulin aspart (novoLOG) injection 0-15 Units  0-15 Units Subcutaneous TID WC Hessie Knows, MD      . insulin aspart (novoLOG) injection 40 Units  40 Units Subcutaneous TID Kathreen Cosier, MD      . insulin glargine (LANTUS) injection 100 Units  100 Units Subcutaneous QHS Hessie Knows, MD      . isosorbide dinitrate (ISORDIL) tablet 30 mg  30 mg Oral TID Hessie Knows, MD      . magnesium citrate solution 1 Bottle  1 Bottle Oral Once PRN Hessie Knows, MD      . magnesium hydroxide (MILK OF MAGNESIA) suspension 15 mL  15 mL Oral QHS Hessie Knows, MD      . meclizine (ANTIVERT) tablet 25 mg  25 mg Oral BID Hessie Knows, MD      . menthol-cetylpyridinium (CEPACOL) lozenge 3 mg  1 lozenge Oral PRN Hessie Knows, MD       Or  . phenol (CHLORASEPTIC) mouth spray 1 spray  1 spray Mouth/Throat PRN Hessie Knows, MD      . Derrill Memo ON 10/09/2017] metFORMIN (GLUCOPHAGE-XR) 24 hr tablet 1,500 mg   1,500 mg Oral Q breakfast Hessie Knows, MD      . methocarbamol (ROBAXIN) tablet 500 mg  500 mg Oral Q6H PRN Hessie Knows, MD       Or  . methocarbamol (ROBAXIN) 500 mg in dextrose 5 % 50 mL IVPB  500 mg Intravenous Q6H PRN Hessie Knows, MD      . metoCLOPramide (REGLAN) tablet 5-10  mg  5-10 mg Oral Q8H PRN Hessie Knows, MD       Or  . metoCLOPramide (REGLAN) injection 5-10 mg  5-10 mg Intravenous Q8H PRN Hessie Knows, MD      . montelukast (SINGULAIR) tablet 10 mg  10 mg Oral Daily Hessie Knows, MD      . morphine 2 MG/ML injection 0.5-1 mg  0.5-1 mg Intravenous Q2H PRN Hessie Knows, MD      . multivitamin with minerals tablet 1 tablet  1 tablet Oral Daily Hessie Knows, MD      . nitroGLYCERIN (NITROSTAT) SL tablet 0.4 mg  0.4 mg Sublingual Q5 min PRN Hessie Knows, MD      . ondansetron [facility]) tablet 4 mg  4 mg Oral Q6H PRN Hessie Knows, MD       Or  . ondansetron [facility]) injection 4 mg  4 mg Intravenous Q6H PRN Hessie Knows, MD      . pregabalin (LYRICA) capsule 300 mg  300 mg Oral BID Hessie Knows, MD      . senna-docusate (Senokot-S) tablet 1 tablet  1 tablet Oral QHS PRN Hessie Knows, MD      . traMADol Veat[facility]bon) tablet 50 mg  50 mg Oral Q6H Hessie Knows, MD      . zolpidem (AMBIEN) tablet 5 mg  5 mg Oral QHS PRN Hessie Knows, MD         Discharge Medications: Please see discharge summary for a list of discharge medications.  Relevant Imaging Results:  Relevant Lab Results:   Additional Information (SSN: [id number])  Avriel Kandel, Veronia Beets, LCSW

## 2017-10-08 NOTE — Op Note (Signed)
10/08/2017  11:38 AM  PATIENT:  Todd Spencer  67 y.o. male  PRE-OPERATIVE DIAGNOSIS:  PRIMARY OSTEOARTHRITIS OF LEFT KNEE  POST-OPERATIVE DIAGNOSIS: Same  PROCEDURE:  Procedure(s): TOTAL KNEE ARTHROPLASTY (Left)  SURGEON: Laurene Footman, MD  ASSISTANTS: None  ANESTHESIA:   spinal  EBL:  Total I/O In: 800 [I.V.:800] Out: 800 [Urine:650; Blood:150]  BLOOD ADMINISTERED:none  DRAINS: none   LOCAL MEDICATIONS USED:  MARCAINE    and OTHER morphine  SPECIMEN:  No Specimen  DISPOSITION OF SPECIMEN:  N/A  COUNTS:  YES  TOURNIQUET:   Total Tourniquet Time Documented: Thigh (Left) - 81 minutes Total: Thigh (Left) - 81 minutes   IMPLANTS: Medacta GMK Sphere 5+ femur, 5 left tibia with short stem and 10 mm insert, size 4 patella, all components cemented  DICTATION: .Dragon Dictation  patient was brought to the operating room and after adequate anesthesia was obtained left legwas prepped and draped in thesterile fashion with tourniquet applied to the leftupper thigh. After patient identification and timeout procedure was completed thetourniquet was raised and midline skin incision was made followed by medial parapatellar arthrotomy. Inspection revealed significant patellofemoral degenerative change of medial compartment, there was exposed bone and eburnation of the medial compartment both femoral and tibial condyleswith moderate mild lateral compartmentosteoarthritis. There is also synovitis present within the joint which was excised. The ACL and fat pad were excised as well. The proximal tibia was exposed and themy knee cutting guide was applied proximal tibia cut carried out.Distal femoral cutting guide applied in a similar fashion and distal femoral cut carried outThe femur was then prepared withsize5+cutting guide withanterior posterior and chamfer cuts made with appropriate size cuts and this appeared to fit well the femoraltrial was placed distal drill  holes made followed by the trochlear groove cut. The tibia was prepared using the tibial template baseplate size 5this was pinned into position proximal tibial preparation carried out for short stem. A 80mm insert gave excellent stability and full extension.  Posterior osteophytes were removed off the distal femur with curved osteotome to thete trials were removed at this point and the bony surfaces thoroughly irrigated and dried after the patellar cutting been carried out with the patellar cutting guide drill holes made and sized to a size 4the above local was then infiltrated in the periarticular tissues and the bony surfaces dried the tibial component was cemented to place first with excess cement removed followed by the polyethylene component with the set screws and a torque screwdriver. The femoral component was placed in the knee held in extension as the cement set with the patellar button clamped into place. After the cement hadset,excess cement was removed and the knee was thoroughly again thoroughly irrigated with tourniquet let down. A lateral release was performed because of some tendency for the patella to want to track laterally. The arthrotomy was then repaired using heavy Brion Aliment,.3-0 v-loccuticular followed by skin staples.Wound was then covered with Xeroform 4 x 4's ABD web roll Polar Care and Ace wrap  PLAN OF CARE: Admit to inpatient   PATIENT DISPOSITION:  PACU - hemodynamically stable.

## 2017-10-08 NOTE — Evaluation (Signed)
Physical Therapy Evaluation Patient Details Name: Todd Spencer MRN: [id number] DOB: 1950/10/31 Today's Date: 10/08/2017   History of Present Illness  Pt is a 67 y.o. male s/p L TKA secondary OA 10/08/17.  PMH includes pacemaker 07/2014, sleep apnea, h/o smoking, htn, CAD, MI, CABG, CHF, cardiac stents, (+) angina with exertion, DM, hiatal hernia.  Clinical Impression  Prior to hospital admission, pt was modified independent ambulating with walking stick.  Pt lives alone in 1 level home with 3 steps with R railing to enter.  Currently pt is min assist supine to sit; min assist to stand with RW; and CGA to ambulate a few feet bed to recliner with RW.  Pain L knee 0/10 at rest beginning of session; increased to 7/10 with activity during session; and 6/10 end of session resting in chair (nurse notified end of session; nurse also gave pt pain meds during session).  Pt able to perform L LE SLR without physical assist (with cueing for technique); no knee buckling noted during session.  Overall limited with activity d/t L knee pain.  A&Ox4 but pt requiring extra time for activities at times and extra cueing (verbal/demo/tactile)--pt may be [facility] which may complicate cueing/activities.   Pt would benefit from skilled PT to address noted impairments and functional limitations (see below for any additional details).  Upon hospital discharge, currently anticipate pt would benefit from STR but will continue to monitor pt's status and update recommendations as appropriate.    Follow Up Recommendations SNF    Equipment Recommendations  Rolling walker with 5" wheels;3in1 (PT)    Recommendations for Other Services       Precautions / Restrictions Precautions Precautions: Fall;Knee Precaution Booklet Issued: Yes (comment) Restrictions Weight Bearing Restrictions: Yes LLE Weight Bearing: Weight bearing as tolerated      Mobility  Bed Mobility Overal bed mobility: Needs Assistance Bed Mobility: Supine to  Sit     Supine to sit: Min assist;HOB elevated     General bed mobility comments: mild increased time to perform; assist for L LE; vc's to use bedrail and to scoot to edge of bed  Transfers Overall transfer level: Needs assistance Equipment used: Rolling walker (2 wheeled) Transfers: Sit to/from Stand Sit to Stand: Min assist         General transfer comment: vc's for UE and LE placement and walker use; assist to initiate and come to full stand  Ambulation/Gait Ambulation/Gait assistance: Min guard Gait Distance (Feet): 3 Feet(bed to recliner) Assistive device: Rolling walker (2 wheeled)   Gait velocity: decrease   General Gait Details: antalgic; decreased stance time L LE; vc's to increase UE support through RW to offweight L LE d/t pain and also keep RW closer   Stairs            Wheelchair Mobility    Modified Rankin (Stroke Patients Only)       Balance Overall balance assessment: Needs assistance Sitting-balance support: No upper extremity supported;Feet supported Sitting balance-Leahy Scale: Normal Sitting balance - Comments: steady sitting reaching outside BOS   Standing balance support: Single extremity supported Standing balance-Leahy Scale: Poor Standing balance comment: pt requiring at least single UE support for static standing balance                             Pertinent Vitals/Pain Pain Assessment: 0-10 Pain Score: 6  Pain Descriptors / Indicators: Guarding;Sore;Aching Pain Intervention(s): Limited activity within patient's tolerance;Monitored during session;Repositioned;RN  gave pain meds during session;Other (comment)(Polar care applied and activated)  Vitals (HR and O2 on room air) stable and WFL throughout treatment session.    Home Living Family/patient expects to be discharged to:: Private residence Living Arrangements: Alone   Type of Home: House Home Access: Stairs to enter Entrance Stairs-Rails: Right Entrance  Stairs-Number of Steps: 3 Home Layout: One level Home Equipment: Other (comment);Walker - standard(walking stick)      Prior Function Level of Independence: Independent with assistive device(s)         Comments: Ambulating with self-made walking stick.     Hand Dominance        Extremity/Trunk Assessment   Upper Extremity Assessment Upper Extremity Assessment: Overall WFL for tasks assessed    Lower Extremity Assessment Lower Extremity Assessment: RLE deficits/detail;LLE deficits/detail RLE Deficits / Details: strength and ROM WFL LLE Deficits / Details: able to perform L LE SLR independently; good quad set LLE: Unable to fully assess due to pain    Cervical / Trunk Assessment Cervical / Trunk Assessment: Normal  Communication   Communication: HOH  Cognition Arousal/Alertness: Awake/alert Behavior During Therapy: WFL for tasks assessed/performed Overall Cognitive Status: (A&O x4)                                 General Comments: Pt requiring extra time for activities at times and extra cueing (verbal/demo/tactile)--pt appears HOH which may complicate cueing/activities      General Comments General comments (skin integrity, edema, etc.): L knee ace wrap and polar care in place.  Nursing cleared pt for participation in physical therapy.  Pt agreeable to PT session and eager to participate.  Pt reporting concerns regarding falling after surgery.    Exercises Total Joint Exercises Ankle Circles/Pumps: AROM;Strengthening;Both;10 reps;Supine Quad Sets: AROM;Strengthening;Both;10 reps;Supine Short Arc Quad: AAROM;AROM;Strengthening;Left;10 reps;Supine Heel Slides: AAROM;Strengthening;Left;10 reps;Supine Hip ABduction/ADduction: AAROM;AROM;Strengthening;Left;10 reps;Supine Straight Leg Raises: AAROM;AROM;Strengthening;Left;10 reps;Supine Goniometric ROM: L knee extension 8 degrees short of neutral semi-supine in bed; L knee flexion 75 degrees sitting edge  of recliner   Assessment/Plan    PT Assessment Patient needs continued PT services  PT Problem List Decreased strength;Decreased range of motion;Decreased activity tolerance;Decreased balance;Decreased mobility;Decreased knowledge of use of DME;Decreased knowledge of precautions;Pain       PT Treatment Interventions DME instruction;Gait training;Stair training;Functional mobility training;Therapeutic activities;Therapeutic exercise;Balance training;Patient/family education    PT Goals (Current goals can be found in the Care Plan section)  Acute Rehab PT Goals Patient Stated Goal: to be able to walk again safely and not fall PT Goal Formulation: With patient Time For Goal Achievement: 10/22/17 Potential to Achieve Goals: Good    Frequency BID   Barriers to discharge Decreased caregiver support      Co-evaluation               AM-PAC PT "6 Clicks" Daily Activity  Outcome Measure Difficulty turning over in bed (including adjusting bedclothes, sheets and blankets)?: Unable Difficulty moving from lying on back to sitting on the side of the bed? : Unable Difficulty sitting down on and standing up from a chair with arms (e.g., wheelchair, bedside commode, etc,.)?: Unable Help needed moving to and from a bed to chair (including a wheelchair)?: A Little Help needed walking in hospital room?: A Little Help needed climbing 3-5 steps with a railing? : A Lot 6 Click Score: 11    End of Session Equipment Utilized During Treatment: Gait belt Activity  Tolerance: Patient limited by pain Patient left: in chair;with call bell/phone within reach;with chair alarm set;with SCD's reapplied;Other (comment)(B heels elevated via towel rolls; polar care in place and activated) Nurse Communication: Mobility status;Precautions;Weight bearing status;Other (comment)(Pt's pain status) PT Visit Diagnosis: Other abnormalities of gait and mobility (R26.89);Muscle weakness (generalized)  (M62.81);Difficulty in walking, not elsewhere classified (R26.2);Pain Pain - Right/Left: Left Pain - part of body: Knee    Time: 2003-7944 PT Time Calculation (min) (ACUTE ONLY): 44 min   Charges:   PT Evaluation $PT Eval Low Complexity: 1 Low PT Treatments $Therapeutic Exercise: 8-22 mins $Therapeutic Activity: 8-22 mins       Leitha Bleak, PT 10/08/17, 4:26 PM [phone]

## 2017-10-09 ENCOUNTER — Encounter: Payer: Self-pay | Admitting: Orthopedic Surgery

## 2017-10-09 LAB — BASIC METABOLIC PANEL
ANION GAP: 9 (ref 5–15)
BUN: 22 mg/dL (ref 8–23)
CALCIUM: 8.2 mg/dL — AB (ref 8.9–10.3)
CHLORIDE: 100 mmol/L (ref 98–111)
CO2: 27 mmol/L (ref 22–32)
CREATININE: 1.26 mg/dL — AB (ref 0.61–1.24)
GFR calc non Af Amer: 57 mL/min — ABNORMAL LOW (ref >60)
Glucose, Bld: 223 mg/dL — ABNORMAL HIGH (ref 70–99)
Potassium: 4.4 mmol/L (ref 3.5–5.1)
SODIUM: 136 mmol/L (ref 135–145)

## 2017-10-09 LAB — GLUCOSE, CAPILLARY
GLUCOSE-CAPILLARY: 99 mg/dL (ref 70–99)
Glucose-Capillary: 221 mg/dL — ABNORMAL HIGH (ref 70–99)
Glucose-Capillary: 128 mg/dL — ABNORMAL HIGH (ref 70–99)
Glucose-Capillary: 82 mg/dL (ref 70–99)
Glucose-Capillary: 82 mg/dL (ref 70–99)

## 2017-10-09 LAB — CBC
HEMATOCRIT: 30.6 % — AB (ref 40.0–52.0)
HEMOGLOBIN: 10.4 g/dL — AB (ref 13.0–18.0)
MCH: 25.6 pg — ABNORMAL LOW (ref 26.0–34.0)
MCHC: 33.9 g/dL (ref 32.0–36.0)
MCV: 75.6 fL — ABNORMAL LOW (ref 80.0–100.0)
Platelets: 118 10*3/uL — ABNORMAL LOW (ref 150–440)
RBC: 4.05 MIL/uL — ABNORMAL LOW (ref 4.40–5.90)
RDW: 18.5 % — ABNORMAL HIGH (ref 11.5–14.5)
WBC: 8.4 10*3/uL (ref 3.8–10.6)

## 2017-10-09 NOTE — Progress Notes (Signed)
Physical Therapy Treatment Patient Details Name: Todd Spencer MRN: [id number] DOB: [birth year] Today's Date: 10/09/2017    History of Present Illness Pt is a 67 y.o. male s/p L TKA secondary OA 10/08/17.  PMH includes pacemaker 07/2014, sleep apnea, h/o smoking, htn, CAD, MI, CABG, CHF, cardiac stents, (+) angina with exertion, DM, hiatal hernia, CKD, DDD, HLD, and Alzheimer's and vascular dementia.    PT Comments    Pt able to progress to ambulating 20 feet x2 with RW CGA to min assist to steady; limited distance d/t L knee pain and fatigue; pt also appearing drowsy.  Decreased cadence noted with ambulation.  Pt requiring consistent cueing to stay closer to RW with ambulation to provide more support for L LE d/t L knee flexing during L LE stance phase but improved with correct walker positioning.  Will continue to progress pt with strengthening, L knee ROM, and progressive ambulation per pt tolerance.    Follow Up Recommendations  SNF     Equipment Recommendations  Rolling walker with 5" wheels;3in1 (PT)    Recommendations for Other Services       Precautions / Restrictions Precautions Precautions: Fall;Knee Precaution Booklet Issued: Yes (comment) Restrictions Weight Bearing Restrictions: Yes LLE Weight Bearing: Weight bearing as tolerated    Mobility  Bed Mobility Overal bed mobility: Needs Assistance Bed Mobility: Supine to Sit;Sit to Supine     Supine to sit: Min assist;HOB elevated Sit to supine: Min assist;HOB elevated   General bed mobility comments: assist for L LE management; pt using bed rail to assist; extra time and cueing for pt to scoot up in bed  Transfers Overall transfer level: Needs assistance Equipment used: Rolling walker (2 wheeled) Transfers: Sit to/from [facility] Sit to Stand: Min assist Stand pivot transfers: Min guard;Min assist       General transfer comment: vc's for UE and LE placement sit to/from stand (from bed and  from [facility] over toilet); assist to initiate stand up to RW and control descent sitting  Ambulation/Gait Ambulation/Gait assistance: Min guard;Min assist Gait Distance (Feet): (20 feet x2) Assistive device: Rolling walker (2 wheeled)   Gait velocity: decrease   General Gait Details: antalgic; decreased stance time L LE; vc's to increase UE support through RW to offweight L LE d/t pain and also keep RW closer; L knee flexing mildly when taking steps with R LE requiring intermittent cueing for L quad set during L LE stance phase(pt requiring increased UE support through RW and to keep RW closer to assist and prevent L knee buckling)   Stairs             Wheelchair Mobility    Modified Rankin (Stroke Patients Only)       Balance Overall balance assessment: Needs assistance Sitting-balance support: No upper extremity supported;Feet supported Sitting balance-Leahy Scale: Normal Sitting balance - Comments: steady sitting reaching outside BOS   Standing balance support: Single extremity supported Standing balance-Leahy Scale: Poor Standing balance comment: pt requiring min assist to steady standing managing shorts for toileting                            Cognition Arousal/Alertness: Awake/alert Behavior During Therapy: Flat affect Overall Cognitive Status: No family/caregiver present to determine baseline cognitive functioning  General Comments: A&O x4; increased time to respond to questions and initiate movement; extra vc's/tactile cues/demo required for activities during session; does better with simple cues      Exercises      General Comments General comments (skin integrity, edema, etc.): L knee ace wrap and polar care in place.  Pt agreeable to PT session.      Pertinent Vitals/Pain Pain Assessment: 0-10 Pain Score: 8  Pain Location: L knee Pain Descriptors / Indicators: Guarding;Sore;Aching Pain  Intervention(s): Limited activity within patient's tolerance;Monitored during session;Repositioned;Other (comment)(polar care applied and activated; nurse notified regarding pt's pain level)  O2 90% or greater on room air.  HR WFL during session.    Home Living                      Prior Function            PT Goals (current goals can now be found in the care plan section) Acute Rehab PT Goals Patient Stated Goal: go to rehab and get better so I can go home PT Goal Formulation: With patient Time For Goal Achievement: 10/22/17 Potential to Achieve Goals: Good Progress towards PT goals: Progressing toward goals    Frequency    BID      PT Plan Current plan remains appropriate    Co-evaluation              AM-PAC PT "6 Clicks" Daily Activity  Outcome Measure  Difficulty turning over in bed (including adjusting bedclothes, sheets and blankets)?: Unable Difficulty moving from lying on back to sitting on the side of the bed? : Unable Difficulty sitting down on and standing up from a chair with arms (e.g., wheelchair, bedside commode, etc,.)?: Unable Help needed moving to and from a bed to chair (including a wheelchair)?: A Little Help needed walking in hospital room?: A Little Help needed climbing 3-5 steps with a railing? : A Lot 6 Click Score: 11    End of Session Equipment Utilized During Treatment: Gait belt Activity Tolerance: Patient limited by pain Patient left: in bed;with call bell/phone within reach;with bed alarm set;with SCD's reapplied;Other (comment)(pt declined bone foam; B towel rolls place; polar care placed and activated) Nurse Communication: Mobility status;Precautions;Weight bearing status;Other (comment)(Pt's pain status; pt refusing bone foam; pt's cognition concerns) PT Visit Diagnosis: Other abnormalities of gait and mobility (R26.89);Muscle weakness (generalized) (M62.81);Difficulty in walking, not elsewhere classified (R26.2);Pain Pain  - Right/Left: Left Pain - part of body: Knee     Time: 8811-0315 PT Time Calculation (min) (ACUTE ONLY): 39 min  Charges:  $Gait Training: 23-37 mins $Therapeutic Activity: 8-22 mins                    Leitha Bleak, PT 10/09/17, 5:11 PM [phone]

## 2017-10-09 NOTE — Progress Notes (Signed)
Physical Therapy Treatment Patient Details Name: Todd Spencer MRN: [id number] DOB: 1950-05-24 Today's Date: 10/09/2017    History of Present Illness Pt is a 67 y.o. male s/p L TKA secondary OA 10/08/17.  PMH includes pacemaker 07/2014, sleep apnea, h/o smoking, htn, CAD, MI, CABG, CHF, cardiac stents, (+) angina with exertion, DM, hiatal hernia, CKD, DDD, HLD, and Alzheimer's and vascular dementia.    PT Comments    Pt appearing drowsy this morning (nurse reports pt did not sleep well last night).  Increased time required for pt to respond to questions and to initiate movement for activity.  Pt responding better to simple cues.  Appears forgetful with activities.  Pain L knee 8/10 beginning of session resting in chair but 5/10 end of session resting in bed (nurse notified).  During ambulation, L knee flexing during L LE stance phase requiring cueing for L quad set during L LE stance phase and to increase UE support through RW to prevent L knee buckling.  L knee flexion ROM to 80 degrees AAROM.  Pt requiring education on importance of keeping L knee straight when sitting or laying in bed to promote optimal L knee extension ROM.  Will continue to progress pt with strengthening, knee ROM, and progressive functional mobility per pt tolerance.   Follow Up Recommendations  SNF     Equipment Recommendations  Rolling walker with 5" wheels;3in1 (PT)    Recommendations for Other Services       Precautions / Restrictions Precautions Precautions: Fall;Knee Precaution Booklet Issued: Yes (comment) Restrictions Weight Bearing Restrictions: Yes LLE Weight Bearing: Weight bearing as tolerated    Mobility  Bed Mobility Overal bed mobility: Needs Assistance Bed Mobility: Sit to Supine     Sit to supine: Min assist;HOB elevated   General bed mobility comments: assist for L LE management; pt using bed rail to assist; extra time and cueing for pt to scoot up in bed  Transfers Overall transfer  level: Needs assistance Equipment used: Rolling walker (2 wheeled) Transfers: Sit to/from Stand Sit to Stand: Min assist         General transfer comment: vc's for UE and LE placement sit to/from stand; assist to initiate stand up to RW and control descent sitting onto bed  Ambulation/Gait Ambulation/Gait assistance: Min guard Gait Distance (Feet): 7 Feet Assistive device: Rolling walker (2 wheeled)   Gait velocity: decrease   General Gait Details: antalgic; decreased stance time L LE; vc's to increase UE support through RW to offweight L LE d/t pain and also keep RW closer; L knee flexing when taking steps with R LE requiring consistent cueing for L quad set during L LE stance phase(pt requiring increased UE support through RW to assist and prevent L knee buckling)   Stairs             Wheelchair Mobility    Modified Rankin (Stroke Patients Only)       Balance Overall balance assessment: Needs assistance Sitting-balance support: No upper extremity supported;Feet supported Sitting balance-Leahy Scale: Normal Sitting balance - Comments: steady sitting reaching outside BOS   Standing balance support: Single extremity supported Standing balance-Leahy Scale: Poor Standing balance comment: pt requiring at least single UE support for static standing balance                            Cognition Arousal/Alertness: Awake/alert Behavior During Therapy: Flat affect Overall Cognitive Status: No family/caregiver present to determine baseline  cognitive functioning                                 General Comments: A&O x4; increased time to respond to questions and initiate movement; extra vc's/tactile cues/demo required for activities during session; does better with simple cues      Exercises Total Joint Exercises Long Arc Quad: Strengthening;AROM;Right;AAROM;Left;10 reps;Seated Goniometric ROM: L knee extension AAROM 10 degrees short of neutral  semi-supine in recliner; L knee flexion 80 degrees AAROM sitting edge of recliner    General Comments General comments (skin integrity, edema, etc.): L knee ace wrap and polar care in place.  Pt agreeable to PT session.      Pertinent Vitals/Pain Pain Assessment: 0-10 Pain Score: 5  Pain Location: L knee Pain Descriptors / Indicators: Guarding;Sore;Aching Pain Intervention(s): Limited activity within patient's tolerance;Monitored during session;Premedicated before session;Repositioned;Other (comment)(polar care applied and activated)  Vitals (HR and O2 on room air) stable and WFL throughout treatment session.    Home Living       Prior Function        PT Goals (current goals can now be found in the care plan section) Acute Rehab PT Goals Patient Stated Goal: go to rehab and get better so I can go home PT Goal Formulation: With patient Time For Goal Achievement: 10/22/17 Potential to Achieve Goals: Good Progress towards PT goals: Progressing toward goals    Frequency    BID      PT Plan Current plan remains appropriate    Co-evaluation              AM-PAC PT "6 Clicks" Daily Activity  Outcome Measure  Difficulty turning over in bed (including adjusting bedclothes, sheets and blankets)?: Unable Difficulty moving from lying on back to sitting on the side of the bed? : Unable Difficulty sitting down on and standing up from a chair with arms (e.g., wheelchair, bedside commode, etc,.)?: Unable Help needed moving to and from a bed to chair (including a wheelchair)?: A Little Help needed walking in hospital room?: A Little Help needed climbing 3-5 steps with a railing? : A Lot 6 Click Score: 11    End of Session Equipment Utilized During Treatment: Gait belt Activity Tolerance: Patient limited by pain;Other (comment)(Limited d/t drowsiness) Patient left: in bed;with call bell/phone within reach;with bed alarm set;with SCD's reapplied;Other (comment)(pt unable to  tolerate bone foam so B towel rolls placed to elevated B heels; polar care in place and activated) Nurse Communication: Mobility status;Precautions;Weight bearing status;Other (comment)(pt unable to tolerate bone foam so towel roll placed instead; pt's pain status; pt appearing drowsy) PT Visit Diagnosis: Other abnormalities of gait and mobility (R26.89);Muscle weakness (generalized) (M62.81);Difficulty in walking, not elsewhere classified (R26.2);Pain Pain - Right/Left: Left Pain - part of body: Knee     Time: 5397-6734 PT Time Calculation (min) (ACUTE ONLY): 38 min  Charges:  $Gait Training: 8-22 mins $Therapeutic Exercise: 8-22 mins $Therapeutic Activity: 8-22 mins                    Leitha Bleak, PT 10/09/17, 11:26 AM [phone]

## 2017-10-09 NOTE — Clinical Social Work Placement (Signed)
   CLINICAL SOCIAL WORK PLACEMENT  NOTE  Date:  10/09/2017  Patient Details  Name: Todd Spencer MRN: [id number] Date of Birth: 10-17-1950  Clinical Social Work is seeking post-discharge placement for this patient at the [place] level of care (*CSW will initial, date and re-position this form in  chart as items are completed):  Yes   Patient/family provided with [place] Work Department's list of facilities offering this level of care within the geographic area requested by the patient (or if unable, by the patient's family).  Yes   Patient/family informed of their freedom to choose among providers that offer the needed level of care, that participate in Medicare, Medicaid or managed care program needed by the patient, have an available bed and are willing to accept the patient.  Yes   Patient/family informed of [facility]'s ownership interest in [facility] and [facility], as well as of the fact that they are under no obligation to receive care at these facilities.  PASRR submitted to EDS on 10/08/17     PASRR number received on 10/08/17     Existing PASRR number confirmed on       FL2 transmitted to all facilities in geographic area requested by pt/family on 10/08/17     FL2 transmitted to all facilities within larger geographic area on       Patient informed that his/her managed care company has contracts with or will negotiate with certain facilities, including the following:            Patient/family informed of bed offers received.  Patient chooses bed at       Physician recommends and patient chooses bed at      Patient to be transferred to   on  .  Patient to be transferred to facility by       Patient family notified on   of transfer.  Name of family member notified:        PHYSICIAN       Additional Comment:    _______________________________________________ Jera Headings, Veronia Beets, LCSW 10/09/2017, 10:23 AM

## 2017-10-09 NOTE — Anesthesia Postprocedure Evaluation (Signed)
Anesthesia Post Note  Patient: Todd Spencer  Procedure(s) Performed: TOTAL KNEE ARTHROPLASTY (Left )  Patient location during evaluation: PACU Anesthesia Type: Spinal Level of consciousness: oriented and awake and alert Pain management: pain level controlled Vital Signs Assessment: post-procedure vital signs reviewed and stable Respiratory status: spontaneous breathing, respiratory function stable and patient connected to nasal cannula oxygen Cardiovascular status: blood pressure returned to baseline and stable Postop Assessment: no headache, no backache and no apparent nausea or vomiting Anesthetic complications: no     Last Vitals:  Vitals:   10/09/17 0405 10/09/17 0802  BP: 115/82 121/78  Pulse: 95 97  Resp: 18 18  Temp: 37.1 C 36.9 C  SpO2: 99% 90%    Last Pain:  Vitals:   10/09/17 0853  TempSrc:   PainSc: 8                  Angelia Hazell,  Clearnce Sorrel

## 2017-10-09 NOTE — Evaluation (Signed)
Occupational Therapy Evaluation Patient Details Name: Todd Spencer MRN: [id number] DOB: 1950/12/04 Today's Date: 10/09/2017    History of Present Illness Pt is a 67 y.o. male s/p L TKA secondary OA 10/08/17.  PMH includes pacemaker 07/2014, sleep apnea, h/o smoking, htn, CAD, MI, CABG, CHF, cardiac stents, (+) angina with exertion, DM, hiatal hernia, CKD, DDD, HLD, and Alzheimer's and vascular dementia..   Clinical Impression   Pt seen for OT evaluation this date, POD#1 from above surgery. Pt was independent in all ADLs prior to surgery, however using a walking stick for mobility due to L knee pain. Pt is eager to return to PLOF with less pain and improved safety and independence. Pt currently requires min-mod assist for LB dressing and bathing while in seated position due to pain and limited AROM of L knee. Pt and neighbor (who verbalizes he can assist pt as needed once pt returns home) instructed in polar care mgt, falls prevention strategies, home/routines modifications, DME/AE for LB bathing and dressing tasks, and compression stocking mgt. Pt would benefit from skilled OT services including additional instruction in dressing techniques with or without assistive devices for dressing and bathing skills to support recall and carryover prior to discharge and ultimately to maximize safety, independence, and minimize falls risk and caregiver burden. Recommend STR upon discharge.   Follow Up Recommendations  SNF    Equipment Recommendations  3 in 1 bedside commode    Recommendations for Other Services       Precautions / Restrictions Precautions Precautions: Fall;Knee Restrictions Weight Bearing Restrictions: Yes LLE Weight Bearing: Weight bearing as tolerated      Mobility Bed Mobility Overal bed mobility: Needs Assistance Bed Mobility: Supine to Sit     Supine to sit: Min assist;HOB elevated     General bed mobility comments: min assist for LLE mgt, pt using bed rail to assist,  increased time/effort to perform  Transfers Overall transfer level: Needs assistance Equipment used: Rolling walker (2 wheeled) Transfers: Sit to/from Stand Sit to Stand: From elevated surface;Min guard;Min assist         General transfer comment: max cues for hand and foot placement    Balance Overall balance assessment: Needs assistance Sitting-balance support: No upper extremity supported;Feet supported Sitting balance-Leahy Scale: Normal     Standing balance support: Bilateral upper extremity supported Standing balance-Leahy Scale: Fair                             ADL either performed or assessed with clinical judgement   ADL Overall ADL's : Needs assistance/impaired Eating/Feeding: Sitting;Independent Eating/Feeding Details (indicate cue type and reason): sat EOB and ate breakfast without difficulty Grooming: Sitting;Modified independent   Upper Body Bathing: Sitting;Supervision/ safety   Lower Body Bathing: Sit to/from stand;Minimal assistance;Moderate assistance   Upper Body Dressing : Sitting;Supervision/safety   Lower Body Dressing: Sit to/from stand;Minimal assistance;Moderate assistance Lower Body Dressing Details (indicate cue type and reason): pt/neighbor instructed in LB dressing AE and compression stocking mgt. Neighbor verbalizes understanding, pt needs reinforcement Toilet Transfer: RW;Min guard;Ambulation;BSC;Cueing for sequencing;Cueing for safety Toilet Transfer Details (indicate cue type and reason): max cues for safety/sequencing         Functional mobility during ADLs: Cueing for safety;Cueing for sequencing;Min guard;Rolling walker(max cues for safety/sequencing)       Vision Baseline Vision/History: Wears glasses Wears Glasses: Reading only Patient Visual Report: No change from baseline       Perception  Praxis      Pertinent Vitals/Pain Pain Assessment: 0-10 Pain Score: 8  Pain Location: posterior aspect of L  knee Pain Descriptors / Indicators: Guarding;Sore;Aching Pain Intervention(s): Limited activity within patient's tolerance;Monitored during session;Repositioned;Ice applied;RN gave pain meds during session;Patient requesting pain meds-RN notified     Hand Dominance Right   Extremity/Trunk Assessment Upper Extremity Assessment Upper Extremity Assessment: Overall WFL for tasks assessed   Lower Extremity Assessment Lower Extremity Assessment: Defer to PT evaluation;LLE deficits/detail LLE Deficits / Details: able to perform L LE SLR independently LLE: Unable to fully assess due to pain   Cervical / Trunk Assessment Cervical / Trunk Assessment: Normal   Communication Communication Communication: Other (comment)(HOH vs receptive difficulties related to dementia?)   Cognition Arousal/Alertness: Awake/alert Behavior During Therapy: WFL for tasks assessed/performed Overall Cognitive Status: No family/caregiver present to determine baseline cognitive functioning                                 General Comments: alert and oriented, additional time to respond to questions, commands, and extra cueing t/o session, does better with simple cues, lower tone voice appears helpful   General Comments       Exercises Other Exercises Other Exercises: Pt/neighbor educated in polar care mgt Other Exercises: Pt/neighbor educated in falls prevention strategies    Shoulder Instructions      Home Living Family/patient expects to be discharged to:: Private residence Living Arrangements: Alone Available Help at Discharge: Neighbor;Available PRN/intermittently Type of Home: House Home Access: Stairs to enter [employer] of Steps: 3 Entrance Stairs-Rails: Right Home Layout: One level     Bathroom Shower/Tub: Teacher, early years/pre: Handicapped height     Home Equipment: Other (comment);Walker - standard(walking stick)          Prior  Functioning/Environment Level of Independence: Independent with assistive device(s)        Comments: Ambulating with self-made walking stick.        OT Problem List: Decreased strength;Decreased knowledge of use of DME or AE;Decreased range of motion;Impaired balance (sitting and/or standing);Decreased safety awareness;Pain      OT Treatment/Interventions: Self-care/ADL training;Balance training;Therapeutic exercise;Therapeutic activities;DME and/or AE instruction;Cognitive remediation/compensation;Patient/family education    OT Goals(Current goals can be found in the care plan section) Acute Rehab OT Goals Patient Stated Goal: go to rehab and get better so I can go home OT Goal Formulation: With patient Time For Goal Achievement: 10/23/17 Potential to Achieve Goals: Good ADL Goals Pt Will Perform Lower Body Dressing: with supervision;sit to/from stand;with adaptive equipment Pt Will Transfer to Toilet: with supervision;ambulating(BSC over toilet, LRAD for amb) Additional ADL Goal #1: Pt will independently instruct friend/caregiver in polar care mgt including donning/doffing, wear schedule, and positioning. Additional ADL Goal #2: Pt will independently instruct friend/caregiver in compression stocking mgt including donning/doffing, wear schedule, and positioning.  OT Frequency: Min 1X/week   Barriers to D/C: Decreased caregiver support          Co-evaluation              AM-PAC PT "6 Clicks" Daily Activity     Outcome Measure Help from another person eating meals?: None Help from another person taking care of personal grooming?: None Help from another person toileting, which includes using toliet, bedpan, or urinal?: A Little Help from another person bathing (including washing, rinsing, drying)?: A Lot Help from another person to put on and taking off regular  upper body clothing?: None Help from another person to put on and taking off regular lower body clothing?: A  Lot 6 Click Score: 19   End of Session Equipment Utilized During Treatment: Gait belt;Rolling walker Nurse Communication: Patient requests pain meds  Activity Tolerance: Patient tolerated treatment well Patient left: in chair;with call bell/phone within reach;with chair alarm set;with family/visitor present;with SCD's reapplied;Other (comment)(polar care in place)  OT Visit Diagnosis: Other abnormalities of gait and mobility (R26.89);Muscle weakness (generalized) (M62.81);History of falling (Z91.81);Pain Pain - Right/Left: Left Pain - part of body: Knee                Time: 1994-1290 OT Time Calculation (min): 41 min Charges:  OT General Charges $OT Visit: 1 Visit OT Evaluation $OT Eval Low Complexity: 1 Low OT Treatments $Self Care/Home Management : 23-37 mins  Jeni Salles, MPH, MS, OTR/L ascom [phone] 10/09/17, 9:52 AM

## 2017-10-09 NOTE — Progress Notes (Signed)
Clinical Education officer, museum (CSW) presented bed offers to patient. He chose [facility]. Per [facility] admissions coordinator at [facility] she will start [facility] authorization today. CSW will continue to follow and assist as needed.   [employer], LCSW [phone]

## 2017-10-09 NOTE — Progress Notes (Signed)
Pt alert to voice. Oriented X4 but very drowsy. Did  Not sleep well last night. RN clustered care to allow pt rest. VSS. Pt is voiding well. PO well. Will continue to monitor.

## 2017-10-09 NOTE — Clinical Social Work Note (Signed)
Clinical Social Work Assessment  Patient Details  Name: Todd Spencer MRN: [id number] Date of Birth: 05/08/1950  Date of referral:  10/09/17               Reason for consult:  Facility Placement                Permission sought to share information with:  Chartered certified accountant granted to share information::  Yes, Verbal Permission Granted  Name::      [place]::   [place]   Relationship::     Contact Information:     Housing/Transportation Living arrangements for the past 2 months:  [place] of Information:  Patient, Friend/Neighbor Patient Interpreter Needed:  None Criminal Activity/Legal Involvement Pertinent to Current Situation/Hospitalization:  No - Comment as needed Significant Relationships:  Adult Children, Friend Lives with:  Self Do you feel safe going back to the place where you live?  Yes Need for family participation in patient care:  Yes (Comment)  Care giving concerns:  Patient lives alone in [place].    Social Worker assessment / plan:  Holiday representative (CSW) received SNF consult. PT is recommending SNF. CSW met with patient and his neighbor Arnette Norris was at bedside. Patient was alert and oriented X4 and was sitting up in the chair. CSW introduced self and explained role of CSW department. Per patient he lives in his son Jessie's home alone. Per patient his son Evelena Peat his is HPOA and comes to check on him often. CSW explained SNF process and that [facility] will have to approve it. Patient is agreeable to SNF search in [facility] and requested [facility]. Per patient he went to visit [facility] prior to his surgery. Patient asked what his options would be if [facility] denied SNF. CSW explained that patient can pay out of pocket for SNF, which he said he can't afford to do. CSW also explained that patient could go home with home health. Patient verbalized his understanding. FL2 complete and  faxed out. CSW will continue to follow and assist as needed.   Employment status:  Disabled (Comment on whether or not currently receiving Disability), Retired Nurse, adult PT Recommendations:  [place] / Referral to community resources:  [place]  Patient/Family's Response to care:  Patient is agreeable to [facility] in [place].   Patient/Family's Understanding of and Emotional Response to Diagnosis, Current Treatment, and Prognosis:  Patient was very pleasant and thanked CSW for assistance.   Emotional Assessment Appearance:  Appears stated age Attitude/Demeanor/Rapport:    Affect (typically observed):  Accepting, Adaptable, Pleasant Orientation:  Oriented to Self, Oriented to Place, Oriented to Situation, Oriented to  Time Alcohol / Substance use:  Not Applicable Psych involvement (Current and /or in the community):  No (Comment)  Discharge Needs  Concerns to be addressed:  Discharge Planning Concerns Readmission within the last 30 days:  No Current discharge risk:  Dependent with Mobility Barriers to Discharge:  Continued Medical Work up   [employer], Veronia Beets, LCSW 10/09/2017, 10:25 AM

## 2017-10-09 NOTE — Progress Notes (Signed)
Bs 82. Pt scheduled for lantus 100units. Dr. Jannifer Franklin notified. Order to hold lantus obtained.
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
## 2017-10-10 ENCOUNTER — Inpatient Hospital Stay: Payer: Medicare HMO

## 2017-10-10 LAB — BLOOD GAS, ARTERIAL
ACID-BASE EXCESS: 2.8 mmol/L — AB (ref 0.0–2.0)
Bicarbonate: 27.9 mmol/L (ref 20.0–28.0)
FIO2: 0.44
O2 Saturation: 96.6 %
PCO2 ART: 44 mmHg (ref 32.0–48.0)
PH ART: 7.41 (ref 7.350–7.450)
Patient temperature: 37
pO2, Arterial: 86 mmHg (ref 83.0–108.0)

## 2017-10-10 LAB — FIBRIN DERIVATIVES D-DIMER (ARMC ONLY): Fibrin derivatives D-dimer (ARMC): 805.63 ng/mL (FEU) — ABNORMAL HIGH (ref 0.00–499.00)

## 2017-10-10 LAB — COMPREHENSIVE METABOLIC PANEL
ALK PHOS: 56 U/L (ref 38–126)
ALT: 20 U/L (ref 0–44)
AST: 35 U/L (ref 15–41)
Albumin: 3.5 g/dL (ref 3.5–5.0)
Anion gap: 7 (ref 5–15)
BILIRUBIN TOTAL: 0.9 mg/dL (ref 0.3–1.2)
BUN: 24 mg/dL — AB (ref 8–23)
CALCIUM: 8.9 mg/dL (ref 8.9–10.3)
CO2: 26 mmol/L (ref 22–32)
CREATININE: 1.39 mg/dL — AB (ref 0.61–1.24)
Chloride: 101 mmol/L (ref 98–111)
GFR calc Af Amer: 59 mL/min — ABNORMAL LOW (ref >60)
GFR calc non Af Amer: 51 mL/min — ABNORMAL LOW (ref >60)
Glucose, Bld: 80 mg/dL (ref 70–99)
Potassium: 4.2 mmol/L (ref 3.5–5.1)
Sodium: 134 mmol/L — ABNORMAL LOW (ref 135–145)
TOTAL PROTEIN: 7 g/dL (ref 6.5–8.1)

## 2017-10-10 LAB — FOLATE: Folate: 12.2 ng/mL (ref >5.9)

## 2017-10-10 LAB — CBC
HEMATOCRIT: 28 % — AB (ref 40.0–52.0)
HEMATOCRIT: 30.1 % — AB (ref 40.0–52.0)
HEMOGLOBIN: 10.1 g/dL — AB (ref 13.0–18.0)
Hemoglobin: 9.4 g/dL — ABNORMAL LOW (ref 13.0–18.0)
MCH: 25.1 pg — AB (ref 26.0–34.0)
MCH: 25.2 pg — ABNORMAL LOW (ref 26.0–34.0)
MCHC: 33.4 g/dL (ref 32.0–36.0)
MCHC: 33.5 g/dL (ref 32.0–36.0)
MCV: 75.1 fL — AB (ref 80.0–100.0)
MCV: 75.2 fL — AB (ref 80.0–100.0)
PLATELETS: 106 10*3/uL — AB (ref 150–440)
Platelets: 122 10*3/uL — ABNORMAL LOW (ref 150–440)
RBC: 3.73 MIL/uL — AB (ref 4.40–5.90)
RBC: 4.01 MIL/uL — AB (ref 4.40–5.90)
RDW: 18.5 % — ABNORMAL HIGH (ref 11.5–14.5)
RDW: 18.6 % — AB (ref 11.5–14.5)
WBC: 8.8 10*3/uL (ref 3.8–10.6)
WBC: 9.2 10*3/uL (ref 3.8–10.6)

## 2017-10-10 LAB — GLUCOSE, CAPILLARY
GLUCOSE-CAPILLARY: 82 mg/dL (ref 70–99)
GLUCOSE-CAPILLARY: 83 mg/dL (ref 70–99)
Glucose-Capillary: 175 mg/dL — ABNORMAL HIGH (ref 70–99)
Glucose-Capillary: 168 mg/dL — ABNORMAL HIGH (ref 70–99)
Glucose-Capillary: 86 mg/dL (ref 70–99)

## 2017-10-10 LAB — TROPONIN I: Troponin I: <0.03 ng/mL (ref <0.03)

## 2017-10-10 LAB — PROCALCITONIN: Procalcitonin: 0.38 ng/mL

## 2017-10-10 LAB — BRAIN NATRIURETIC PEPTIDE: B Natriuretic Peptide: 32 pg/mL (ref 0.0–100.0)

## 2017-10-10 MED ORDER — ASPIRIN 325 MG PO TBEC: 325.0000 mg | DELAYED_RELEASE_TABLET | Freq: Every day | ORAL | 0 refills | Status: DC

## 2017-10-10 MED ORDER — HYDROCODONE-ACETAMINOPHEN 5-325 MG PO TABS: 1.0000 | ORAL_TABLET | ORAL | 0 refills | Status: DC | PRN

## 2017-10-10 MED ORDER — TRAMADOL HCL 50 MG PO TABS: 50.0000 mg | ORAL_TABLET | Freq: Four times a day (QID) | ORAL | 1 refills | Status: DC

## 2017-10-10 MED ORDER — DEXTROSE-NACL 5-0.45 % IV SOLN
INTRAVENOUS | Status: DC
  Administered 2017-10-10: via INTRAVENOUS

## 2017-10-10 NOTE — Progress Notes (Signed)
PT Cancellation Note  Patient Details Name: Todd Spencer MRN: [id number] DOB: [birth year]   Cancelled Treatment:    Reason Eval/Treat Not Completed: Other (comment).  Upon PT arrival, pt soundly sleeping in bed.  Therapist attempted waking pt up a few times but pt briefly speaking to therapist and falling back asleep each time; nurse notified who reports pt was awake this morning but pt fell back asleep as soon as pt was assisted back to bed this morning.  Will re-attempt PT treatment later today.  Leitha Bleak, PT 10/10/17, 10:28 AM [phone]

## 2017-10-10 NOTE — Progress Notes (Signed)
Per [facility] admissions coordinator at [facility] SNF authorization has been received. Patient can D/C to [facility] when medically stable. Patient is aware of above.  [employer], LCSW [phone]

## 2017-10-10 NOTE — Progress Notes (Signed)
Physical Therapy Treatment Patient Details Name: Todd Spencer MRN: [id number] DOB: 1950-05-06 Today's Date: 10/10/2017    History of Present Illness Pt is a 67 y.o. male s/p L TKA secondary OA 10/08/17.  PMH includes pacemaker 07/2014, sleep apnea, h/o smoking, htn, CAD, MI, CABG, CHF, cardiac stents, (+) angina with exertion, DM, hiatal hernia, CKD, DDD, HLD, and Alzheimer's and vascular dementia.    PT Comments    Pt requiring max cueing to wake up to participate in therapy session.  Max assist required to perform supine to sit and attempt to stand with RW (pt unable to reach full stand with 1 assist).  Pt reporting feeling "drunk" and appearing lethargic/drowsy.  Pt assisted back to bed with 2 assist for safety and pt fell back asleep in bed within about 1 minute.  Nurse notified of above as well as pt reporting using sleeping machine at night prior to hospitalization (pt with h/o sleep apnea) and has not been using at hospital.  Will attempt to progress pt as able.    Follow Up Recommendations  SNF     Equipment Recommendations  Rolling walker with 5" wheels;3in1 (PT)    Recommendations for Other Services       Precautions / Restrictions Precautions Precautions: Fall;Knee Precaution Booklet Issued: Yes (comment) Restrictions Weight Bearing Restrictions: Yes LLE Weight Bearing: Weight bearing as tolerated    Mobility  Bed Mobility Overal bed mobility: Needs Assistance Bed Mobility: Supine to Sit;Sit to Supine     Supine to sit: Max assist;HOB elevated Sit to supine: Max assist;+2 for physical assistance;HOB elevated   General bed mobility comments: assist for L LE management and trunk; vc's to use bed rail to assist; extra time and cueing for pt to perform; 2 assist provided for safety laying back down in bed and required 2 assist to boost up in bed  Transfers Overall transfer level: Needs assistance Equipment used: Rolling walker (2 wheeled) Transfers: Sit to/from  Stand Sit to Stand: Max assist         General transfer comment: vc's for UE and LE placement sit to/from stand; unable to come to full upright standing with max verbal and tactile cueing and extra time  Ambulation/Gait             General Gait Details: Deferred d/t pt unable to stand upright with 1 assist   Stairs             Wheelchair Mobility    Modified Rankin (Stroke Patients Only)       Balance Overall balance assessment: Needs assistance Sitting-balance support: Single extremity supported;Feet supported Sitting balance-Leahy Scale: Poor Sitting balance - Comments: requires at least single UE support for static sitting balance; intermittent posterior or anterior lean noted (pt appearing drowsy)       Standing balance comment: pt unable to come to full upright posture with assist                            Cognition Arousal/Alertness: Lethargic Behavior During Therapy: Flat affect Overall Cognitive Status: No family/caregiver present to determine baseline cognitive functioning                                 General Comments: A&O x4; increased time to respond to questions and initiate movement; extra vc's/tactile cues/demo required for activities during session; does better with simple cues  Exercises Total Joint Exercises Goniometric ROM: L knee extension PROM 10 degrees short of neutral semi-supine in bed; L knee flexion 85 degrees AAROM sitting edge of bed    General Comments General comments (skin integrity, edema, etc.): L knee polar care in place.  Pt agreeable to PT session once woken.      Pertinent Vitals/Pain Pain Assessment: Faces Faces Pain Scale: Hurts a little bit(6/10 with activity; 2/10 at rest) Pain Location: L knee Pain Descriptors / Indicators: Guarding;Sore;Aching;Grimacing Pain Intervention(s): Limited activity within patient's tolerance;Monitored during session;Premedicated before  session;Repositioned;Other (comment)(polar care applied and activated)  HR WFL; O2 noted to be 89% in bed end of session on room air (nurse notified)    Home Living                      Prior Function            PT Goals (current goals can now be found in the care plan section) Acute Rehab PT Goals Patient Stated Goal: go to rehab and get better so I can go home PT Goal Formulation: With patient Time For Goal Achievement: 10/22/17 Potential to Achieve Goals: Good Progress towards PT goals: Progressing toward goals    Frequency    BID      PT Plan Current plan remains appropriate    Co-evaluation              AM-PAC PT "6 Clicks" Daily Activity  Outcome Measure  Difficulty turning over in bed (including adjusting bedclothes, sheets and blankets)?: Unable Difficulty moving from lying on back to sitting on the side of the bed? : Unable Difficulty sitting down on and standing up from a chair with arms (e.g., wheelchair, bedside commode, etc,.)?: Unable Help needed moving to and from a bed to chair (including a wheelchair)?: Total Help needed walking in hospital room?: Total Help needed climbing 3-5 steps with a railing? : Total 6 Click Score: 6    End of Session Equipment Utilized During Treatment: Gait belt Activity Tolerance: Patient limited by pain;Patient limited by lethargy Patient left: in bed;with call bell/phone within reach;with bed alarm set;with SCD's reapplied;Other (comment)(B heels elevated via towel rolls; polar care in place and activated) Nurse Communication: Mobility status;Precautions;Weight bearing status;Other (comment)(Pt's lethargy and O2 sats) PT Visit Diagnosis: Other abnormalities of gait and mobility (R26.89);Muscle weakness (generalized) (M62.81);Difficulty in walking, not elsewhere classified (R26.2);Pain Pain - Right/Left: Left Pain - part of body: Knee     Time: 1200-1223 PT Time Calculation (min) (ACUTE ONLY): 23  min  Charges:  $Therapeutic Exercise: 8-22 mins $Therapeutic Activity: 8-22 mins                    Leitha Bleak, PT 10/10/17, 1:14 PM [phone]

## 2017-10-10 NOTE — Progress Notes (Signed)
Called by pt RN to come place CPAP on patient due to pt falling asleep at this time with history of sleep apnea and concern for his breathing.  Raquel Sarna, RN states she is also going to place continuous pulse ox on pt.

## 2017-10-10 NOTE — Progress Notes (Signed)
Responded to rapid response. ABG drawn, results given to RN at bedside. Pt placed back on Cpap with 6L O2 bleed-in. Will continue to monitor pt and titrate O2 as tolerated.

## 2017-10-10 NOTE — Discharge Instructions (Signed)

## 2017-10-10 NOTE — Progress Notes (Signed)
Physical Therapy Treatment Patient Details Name: Todd Spencer MRN: [id number] DOB: 05/11/1950 Today's Date: 10/10/2017    History of Present Illness Pt is a 67 y.o. male s/p L TKA secondary OA 10/08/17.  PMH includes pacemaker 07/2014, sleep apnea, h/o smoking, htn, CAD, MI, CABG, CHF, cardiac stents, (+) angina with exertion, DM, hiatal hernia, CKD, DDD, HLD, and Alzheimer's and vascular dementia.    PT Comments    Nurse reports medications adjusted (to decrease any potential sedating medications) and CPAP ordered.  Pt soundly sleeping upon PT arrival but woken with tactile and vc's.  Pt requiring consistent cueing and interaction to stay awake and interactive during session, otherwise easily falling back to sleep.  Attempted to perform LE ex's but pt either not assisting or resisting any attempts.  Pt requiring 2 assist to stand x3 trials with RW (bed height elevated).  Able to stand shifting weight using RW but unable to take any steps (d/t pain L knee pain and drowsiness--pt reporting feeling "groggy").  Pt assisted back to bed and within a minute of laying down in bed, pt fell asleep (O2 sats low 90's on room air).  Nursing notified of above (including concerns regarding pt's continued drowsiness/lethargy and pt easily falling back to sleep).  Will continue to progress pt with functional mobility as able.   Follow Up Recommendations  SNF     Equipment Recommendations  Rolling walker with 5" wheels;3in1 (PT)    Recommendations for Other Services       Precautions / Restrictions Precautions Precautions: Fall;Knee Precaution Booklet Issued: Yes (comment) Restrictions Weight Bearing Restrictions: Yes LLE Weight Bearing: Weight bearing as tolerated    Mobility  Bed Mobility Overal bed mobility: Needs Assistance Bed Mobility: Supine to Sit;Sit to Supine     Supine to sit: Max assist;HOB elevated Sit to supine: Max assist;+2 for physical assistance;HOB elevated   General bed  mobility comments: assist for L LE management and trunk; extra time and cueing for pt to perform; 2 assist provided for safety laying back down in bed and required 2 assist to boost up in bed  Transfers Overall transfer level: Needs assistance Equipment used: Rolling walker (2 wheeled) Transfers: Sit to/from Stand Sit to Stand: Min assist;Mod assist;+2 physical assistance         General transfer comment: 1st and 3rd trial standing pt requiring mod assist x2 to come to full upright posture with extra time, cueing, and use of walker (with elevated bed height); 2nd trial pt requiring min assist x2 with RW to stand (with elevated bed height)  Ambulation/Gait Ambulation/Gait assistance: Min assist;Mod assist;+2 physical assistance   Assistive device: Rolling walker (2 wheeled)       General Gait Details: pt able to stand shifting weight side to side x5 reps B with use of walker before needing to sit d/t fatigue and L knee pain; unable to take any steps in standing with assist, walker use, and max cueing   Stairs             Wheelchair Mobility    Modified Rankin (Stroke Patients Only)       Balance Overall balance assessment: Needs assistance Sitting-balance support: Single extremity supported;Feet supported Sitting balance-Leahy Scale: Poor Sitting balance - Comments: requires at least single UE support for static sitting balance; intermittent posterior or anterior lean noted (pt appearing drowsy)   Standing balance support: Bilateral upper extremity supported Standing balance-Leahy Scale: Poor Standing balance comment: pt requires B UE support on RW  for static standing balance (CGA x2 for safety)                            Cognition Arousal/Alertness: Lethargic Behavior During Therapy: Flat affect Overall Cognitive Status: No family/caregiver present to determine baseline cognitive functioning                                 General  Comments: A&O x4; increased time to respond to questions and initiate movement; extra vc's/tactile cues/demo required for activities during session; does better with simple cues      Exercises     General Comments General comments (skin integrity, edema, etc.): L knee polar care in place.  Nursing cleared pt for participation in physical therapy.  Pt agreeable to PT session.      Pertinent Vitals/Pain Pain Assessment: Faces Faces Pain Scale: Hurts a little bit(8/10 with activity; 2/10 at rest) Pain Location: L knee Pain Descriptors / Indicators: Guarding;Sore;Aching;Grimacing Pain Intervention(s): Limited activity within patient's tolerance;Monitored during session;Repositioned;Other (comment)(RN notified; polar care applied and activated)  HR WFL during session    Home Living                      Prior Function            PT Goals (current goals can now be found in the care plan section) Acute Rehab PT Goals Patient Stated Goal: go to rehab and get better so I can go home PT Goal Formulation: With patient Time For Goal Achievement: 10/22/17 Potential to Achieve Goals: Good Progress towards PT goals: Progressing toward goals    Frequency    BID      PT Plan Current plan remains appropriate    Co-evaluation              AM-PAC PT "6 Clicks" Daily Activity  Outcome Measure  Difficulty turning over in bed (including adjusting bedclothes, sheets and blankets)?: Unable Difficulty moving from lying on back to sitting on the side of the bed? : Unable Difficulty sitting down on and standing up from a chair with arms (e.g., wheelchair, bedside commode, etc,.)?: Unable Help needed moving to and from a bed to chair (including a wheelchair)?: Total Help needed walking in hospital room?: Total Help needed climbing 3-5 steps with a railing? : Total 6 Click Score: 6    End of Session Equipment Utilized During Treatment: Gait belt Activity Tolerance: Patient  limited by pain;Patient limited by lethargy Patient left: in bed;with call bell/phone within reach;with bed alarm set;with SCD's reapplied;Other (comment)(B heels elevated via towel rolls; polar care applied and activated) Nurse Communication: Mobility status;Precautions;Weight bearing status;Other (comment)(Pt's lethargy/drowsiness and O2 sats) PT Visit Diagnosis: Other abnormalities of gait and mobility (R26.89);Muscle weakness (generalized) (M62.81);Difficulty in walking, not elsewhere classified (R26.2);Pain Pain - Right/Left: Left Pain - part of body: Knee     Time: 0086-7619 PT Time Calculation (min) (ACUTE ONLY): 30 min  $Therapeutic Activity: 23-37 mins                    Leitha Bleak, PT 10/10/17, 3:33 PM [phone]
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
## 2017-10-10 NOTE — Progress Notes (Signed)
Pt remains on CPAP with 6L O2 bleed in and tol well. SPO2 92%, RR 18.
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
## 2017-10-11 LAB — GLUCOSE, CAPILLARY
GLUCOSE-CAPILLARY: 229 mg/dL — AB (ref 70–99)
GLUCOSE-CAPILLARY: 179 mg/dL — AB (ref 70–99)
Glucose-Capillary: 141 mg/dL — ABNORMAL HIGH (ref 70–99)
Glucose-Capillary: 211 mg/dL — ABNORMAL HIGH (ref 70–99)
Glucose-Capillary: 77 mg/dL (ref 70–99)

## 2017-10-11 LAB — VITAMIN B12: Vitamin B-12: 221 pg/mL (ref 180–914)

## 2017-10-11 LAB — AMMONIA: Ammonia: 19 umol/L (ref 9–35)

## 2017-10-11 MED ORDER — HYDROCODONE-ACETAMINOPHEN 7.5-325 MG PO TABS: 1.0000 | ORAL_TABLET | Freq: Four times a day (QID) | ORAL | Status: DC | PRN

## 2017-10-11 MED ORDER — HYDROCODONE-ACETAMINOPHEN 5-325 MG PO TABS: 1.0000 | ORAL_TABLET | Freq: Four times a day (QID) | ORAL | Status: DC | PRN

## 2017-10-11 MED ORDER — INSULIN ASPART 100 UNIT/ML ~~LOC~~ SOLN: 10.0000 [IU] | Freq: Three times a day (TID) | SUBCUTANEOUS | Status: DC

## 2017-10-11 MED ORDER — SODIUM CHLORIDE 0.9 % IV SOLN
INTRAVENOUS | Status: DC
  Administered 2017-10-11: 16:00:00 via INTRAVENOUS

## 2017-10-11 MED ORDER — POLYETHYLENE GLYCOL 3350 17 G PO PACK
17.0000 g | PACK | Freq: Every day | ORAL | Status: DC
  Administered 2017-10-11: 17 g via ORAL
  Filled 2017-10-11: qty 1

## 2017-10-11 MED ORDER — TRAZODONE HCL 50 MG PO TABS
50.0000 mg | ORAL_TABLET | Freq: Every day | ORAL | Status: DC
  Administered 2017-10-11: 50 mg via ORAL
  Filled 2017-10-11: qty 1

## 2017-10-11 MED ORDER — FLEET ENEMA 7-19 GM/118ML RE ENEM
1.0000 | ENEMA | Freq: Once | RECTAL | Status: AC
  Administered 2017-10-11: 1 via RECTAL

## 2017-10-11 NOTE — Plan of Care (Signed)
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
## 2017-10-11 NOTE — Progress Notes (Signed)
Physical Therapy Treatment Patient Details Name: Todd Spencer MRN: [id number] DOB: 14-Jan-1951 Today's Date: 10/11/2017    History of Present Illness Pt is a 67 y.o. male s/p L TKA secondary OA 10/08/17.  PMH includes pacemaker 07/2014, sleep apnea, h/o smoking, htn, CAD, MI, CABG, CHF, cardiac stents, (+) angina with exertion, DM, hiatal hernia, CKD, DDD, HLD, and Alzheimer's and vascular dementia. Of note, pt with rapid response last night secondary to respiratory distress. Currently on room air throughout session.    PT Comments    Pt is making gradual progress towards goals, however still very confused and needs reorienting to task. Pt very impulsive, trying to get out of chair upon entering room. Needs +2 assist for all mobility/transfers. Still requiring assist for there-ex. All mobility perform on room air this session with O2 sats WNL. Will continue to progress as able.    Follow Up Recommendations  SNF     Equipment Recommendations  Rolling walker with 5" wheels;3in1 (PT)    Recommendations for Other Services       Precautions / Restrictions Precautions Precautions: Fall;Knee Precaution Booklet Issued: Yes (comment) Restrictions Weight Bearing Restrictions: Yes LLE Weight Bearing: Weight bearing as tolerated    Mobility  Bed Mobility Overal bed mobility: Needs Assistance Bed Mobility: Sit to Supine       Sit to supine: Min assist;+2 for physical assistance   General bed mobility comments: needs assist raising L LE into bed with 2nd person for trunk support. once supine in bed, needs assist to slide towards HOB.  Transfers Overall transfer level: Needs assistance Equipment used: Rolling walker (2 wheeled) Transfers: Sit to/from Stand Sit to Stand: Min assist;+2 physical assistance;From elevated surface         General transfer comment: Pt with improved ability to tolerate standing this date. Upright posture noted and RW used.  Struggles from lower surface  requiring increased time  Ambulation/Gait Ambulation/Gait assistance: Min assist;+2 physical assistance Gait Distance (Feet): 3 Feet Assistive device: Rolling walker (2 wheeled) Gait Pattern/deviations: Step-to pattern     General Gait Details: slow step to gait pattern with heavy cues for sequencing. Upright posture noted and decreased pain this session   Stairs             Wheelchair Mobility    Modified Rankin (Stroke Patients Only)       Balance                                            Cognition Arousal/Alertness: Awake/alert Behavior During Therapy: WFL for tasks assessed/performed Overall Cognitive Status: Impaired/Different from baseline                                 General Comments: Pt still appears confused to situation and ADLs like donning/doffing gown      Exercises Other Exercises Other Exercises: Supine ther-ex performed on L LE including ankle pumps, quad sets, SLRs, hip abd/add, and SAQ. All ther-ex performed x 15 reps with min/mod assist and heavy cues for sequencing and technique.    General Comments        Pertinent Vitals/Pain Pain Assessment: 0-10 Pain Score: 2  Pain Location: L knee Pain Descriptors / Indicators: Guarding;Sore;Aching;Grimacing Pain Intervention(s): Limited activity within patient's tolerance    Home Living  Prior Function            PT Goals (current goals can now be found in the care plan section) Acute Rehab PT Goals Patient Stated Goal: go to rehab and get better so I can go home PT Goal Formulation: With patient Time For Goal Achievement: 10/22/17 Potential to Achieve Goals: Good Progress towards PT goals: Progressing toward goals    Frequency    BID      PT Plan Current plan remains appropriate    Co-evaluation              AM-PAC PT "6 Clicks" Daily Activity  Outcome Measure  Difficulty turning over in bed (including  adjusting bedclothes, sheets and blankets)?: Unable Difficulty moving from lying on back to sitting on the side of the bed? : Unable Difficulty sitting down on and standing up from a chair with arms (e.g., wheelchair, bedside commode, etc,.)?: Unable Help needed moving to and from a bed to chair (including a wheelchair)?: A Lot Help needed walking in hospital room?: A Lot Help needed climbing 3-5 steps with a railing? : Total 6 Click Score: 8    End of Session Equipment Utilized During Treatment: Gait belt Activity Tolerance: Patient tolerated treatment well Patient left: in bed;with bed alarm set;with SCD's reapplied Nurse Communication: Mobility status PT Visit Diagnosis: Other abnormalities of gait and mobility (R26.89);Muscle weakness (generalized) (M62.81);Difficulty in walking, not elsewhere classified (R26.2);Pain Pain - Right/Left: Left Pain - part of body: Knee     Time: 1342-1411 PT Time Calculation (min) (ACUTE ONLY): 29 min  Charges:  $Gait Training: 8-22 mins $Therapeutic Exercise: 8-22 mins                     Greggory Stallion, PT, DPT [phone]    Tay Whitwell 10/11/2017, 3:59 PM

## 2017-10-11 NOTE — Progress Notes (Signed)
Patient ID: Todd Spencer, male   DOB: 1950-10-18, 67 y.o.   MRN: [id number]  [facility] PROGRESS NOTE  Todd Spencer BZJ:[id number] DOB: 10/02/1950 DOA: 10/08/2017 PCP: [place] Pink, MD  HPI/Subjective: When I walked in the room the patient was holding his luggage.  I took the luggage and put it on the floor.  He handed me his wallet and asked me to put it in his luggage.  Patient was nauseous and did not eat very much.  Has not had a bowel movement.  No complaints of shortness of breath.  Objective: Vitals:   10/11/17 0802 10/11/17 1234  BP: (!) 147/85 (!) 155/83  Pulse: 89 97  Resp: 20   Temp: 97.8 F (36.6 C) 98.1 F (36.7 C)  SpO2: 99% 98%    Filed Weights   10/08/17 0624  Weight: 107.3 kg    ROS: Review of Systems  Constitutional: Negative for chills and fever.  Eyes: Negative for blurred vision.  Respiratory: Negative for cough and shortness of breath.   Cardiovascular: Negative for chest pain.  Gastrointestinal: Positive for constipation and nausea. Negative for abdominal pain, diarrhea and vomiting.  Genitourinary: Negative for dysuria.  Musculoskeletal: Negative for joint pain.  Neurological: Negative for dizziness and headaches.   Exam: Physical Exam  HENT:  Nose: No mucosal edema.  Mouth/Throat: No oropharyngeal exudate or posterior oropharyngeal edema.  Eyes: Pupils are equal, round, and reactive to light. Conjunctivae, EOM and lids are normal.  Neck: No JVD present. Carotid bruit is not present. No edema present. No thyroid mass and no thyromegaly present.  Cardiovascular: S1 normal and S2 normal. Exam reveals no gallop.  No murmur heard. Pulses:      Dorsalis pedis pulses are 2+ on the right side, and 2+ on the left side.  Respiratory: No respiratory distress. He has decreased breath sounds in the right lower field and the left lower field. He has no wheezes. He has no rhonchi. He has no rales.  GI: Soft. Bowel sounds are normal. There is no  tenderness.  Musculoskeletal:       Right ankle: He exhibits swelling.       Left ankle: He exhibits swelling.  Lymphadenopathy:    He has no cervical adenopathy.  Neurological: He is alert. No cranial nerve deficit.  Skin: Skin is warm. No rash noted. Nails show no clubbing.  Psychiatric: He has a normal mood and affect.      Data Reviewed: Basic Metabolic Panel: Recent Labs  Lab 10/09/17 0406 10/10/17 2032  NA 136 134*  K 4.4 4.2  CL 100 101  CO2 27 26  GLUCOSE 223* 80  BUN 22 24*  CREATININE 1.26* 1.39*  CALCIUM 8.2* 8.9   Liver Function Tests: Recent Labs  Lab 10/10/17 2032  AST 35  ALT 20  ALKPHOS 56  BILITOT 0.9  PROT 7.0  ALBUMIN 3.5    Recent Labs  Lab 10/11/17 0005  AMMONIA 19   CBC: Recent Labs  Lab 10/09/17 0406 10/10/17 0349 10/10/17 2032  WBC 8.4 8.8 9.2  HGB 10.4* 9.4* 10.1*  HCT 30.6* 28.0* 30.1*  MCV 75.6* 75.1* 75.2*  PLT 118* 106* 122*   Cardiac Enzymes: Recent Labs  Lab 10/10/17 2032  TROPONINI <0.03   BNP (last 3 results) Recent Labs    10/10/17 2024  BNP 32.0     CBG: Recent Labs  Lab 10/10/17 1901 10/10/17 2146 10/11/17 0257 10/11/17 0806 10/11/17 1156  GLUCAP 83 86 141* 211*  229*     Studies: Ct Head Wo Contrast  Result Date: 10/10/2017 CLINICAL DATA:  Altered mental status.  Lethargy. EXAM: CT HEAD WITHOUT CONTRAST TECHNIQUE: Contiguous axial images were obtained from the base of the skull through the vertex without intravenous contrast. COMPARISON:  Head CT 09/11/2011, 02/22/2006 FINDINGS: Brain: No intracranial hemorrhage or evidence of acute ischemia. Generalized atrophy with the bifrontal and right greater than left predominance, unchanged from prior exams. Right frontal extra-axial CSF prominence extending along the falx is chronic and unchanged, may be secondary to asymmetric atrophy or chronic hygroma. Minimal chronic rightward midline shift of the lateral ventricles. No hydrocephalus, the basilar  cisterns are patent. Vascular: Atherosclerosis of skullbase vasculature without hyperdense vessel or abnormal calcification. Skull: No fracture or focal lesion. Sinuses/Orbits: Paranasal sinuses and mastoid air cells are clear. The visualized orbits are unremarkable. Bilateral cataract resection. Other: None. IMPRESSION: 1.  No acute intracranial abnormality. 2. Unchanged atrophy, most prominent in the bifrontal regions. Electronically Signed   By: Jeb Levering M.D.   On: 10/10/2017 21:11   Dg Chest Port 1 View  Result Date: 10/10/2017 CLINICAL DATA:  Weakness EXAM: PORTABLE CHEST 1 VIEW COMPARISON:  01/04/2017, CT chest 10/16/2016 FINDINGS: No focal opacity or pleural effusion. Mild cardiomegaly. Enlarged mediastinal silhouette, likely augmented by portable supine technique. No pneumothorax. IMPRESSION: No active disease. Cardiomegaly. Enlarged mediastinal silhouette felt largely due to lordotic positioning and AP portable technique. Electronically Signed   By: Donavan Foil M.D.   On: 10/10/2017 21:04   Dg Abd Portable 1v  Result Date: 10/10/2017 CLINICAL DATA:  Abdominal distension EXAM: PORTABLE ABDOMEN - 1 VIEW COMPARISON:  01/05/2017 FINDINGS: Surgical clips near the GE junction. Gas-filled slightly dilated small bowel measuring up to 4 cm with mild gaseous enlargement of the colon. Moderate stool in the colon. No abnormal calcification. IMPRESSION: Gaseous distension of small and large bowel suggestive of an ileus. Moderate stool in the colon. Electronically Signed   By: Donavan Foil M.D.   On: 10/10/2017 21:06    Scheduled Meds: . aspirin EC  325 mg Oral Q breakfast  . atorvastatin  40 mg Oral q1800  . carvedilol  3.125 mg Oral BID WC  . docusate sodium  100 mg Oral BID  . famotidine  20 mg Oral Daily  . gemfibrozil  600 mg Oral BID AC  . insulin aspart  0-15 Units Subcutaneous TID WC  . insulin aspart  40 Units Subcutaneous TID AC  . insulin glargine  100 Units Subcutaneous QHS  .  isosorbide dinitrate  30 mg Oral TID  . magnesium hydroxide  15 mL Oral QHS  . metFORMIN  1,500 mg Oral Q breakfast  . montelukast  10 mg Oral Daily  . multivitamin with minerals  1 tablet Oral Daily  . polyethylene glycol  17 g Oral Daily  . sodium phosphate  1 enema Rectal Once  . traZODone  50 mg Oral QHS   Continuous Infusions:  Assessment/Plan:  1. Acute encephalopathy and hypoxia last night.  Patient seems better but still little slow with answering some questions.  CT scan of the head is negative.  Cut back on pain medications.  Get rid of Benadryl Reglan.  Only use pain medications if absolutely needed.  Try to taper off oxygen.  Try to get sleeping with trazodone tonight. 2. Acute hypoxic respiratory failure with increasing oxygen requirements last night.  Try to taper off oxygen. 3. Ileus and nausea.  Clear liquid diet.  Try to  get a bowel movement with MiraLAX and enema 4. Sleep apnea and obesity CPAP at night 5. History of CAD on aspirin, Coreg Imdur and atorvastatin 6. Type 2 diabetes mellitus on glargine insulin and sliding scale and metformin 7. Status post left total knee replacement.  Followed by orthopedics 8. Chronic kidney disease stage III continue to monitor closely  Code Status:     Code Status Orders  (From admission, onward)         Start     Ordered   10/08/17 1248  Full code  Continuous     10/08/17 1249        Code Status History    Date Active Date Inactive Code Status Order ID Comments User Context   01/04/2017 2313 01/06/2017 2043 Full Code [id number]  Vickie Epley, MD ED   10/27/2016 1355 10/30/2016 1636 Full Code [id number]  Olean Ree, MD ED   01/22/2016 0319 01/22/2016 1453 Full Code [id number]  Harrie Foreman, MD Inpatient   12/12/2015 0640 12/14/2015 2003 Full Code [id number]  Hubbard Robinson, MD ED   11/04/2014 0821 11/04/2014 1244 Full Code [id number]  Isaias Cowman, MD Inpatient    Advance Directive Documentation     Most  Recent Value  Type of Advance Directive  Healthcare Power of Attorney  Pre-existing out of facility DNR order (yellow form or pink MOST form)  -  "MOST" Form in Place?  -     Family Communication: As per orthopedics Disposition Plan: Hopefully over to rehab over the weekend  Consultants:  Orthopedic surgery  Time spent: 28 minutes  [place]

## 2017-10-11 NOTE — Progress Notes (Signed)
Physical Therapy Treatment Patient Details Name: Todd Spencer MRN: [id number] DOB: [birth year] Today's Date: 10/11/2017    History of Present Illness Pt is a 67 y.o. male s/p L TKA secondary OA 10/08/17.  PMH includes pacemaker 07/2014, sleep apnea, h/o smoking, htn, CAD, MI, CABG, CHF, cardiac stents, (+) angina with exertion, DM, hiatal hernia, CKD, DDD, HLD, and Alzheimer's and vascular dementia. Of note, pt with rapid response last night secondary to respiratory distress. Currently on 6L of O2 at this time.    PT Comments    Pt is making increased progress towards goals this date with improved ability to perform OOB and ambulate to recliner. Still requires +2 assist for safety and pt still appears confused compared to baseline. Upon entering room, pt trying to get OOB and bed alarm not activated. Pt reoriented and able to follow commands. O2 sats WNL on 6L of O2 and removed for all mobility with O2 sats remaining WNL. Notified RN, however reapplied to 3L of O2 with sats WNL. Will plan to progress as able.   Follow Up Recommendations  SNF     Equipment Recommendations  Rolling walker with 5" wheels;3in1 (PT)    Recommendations for Other Services       Precautions / Restrictions Precautions Precautions: Fall;Knee Precaution Booklet Issued: Yes (comment) Restrictions Weight Bearing Restrictions: Yes LLE Weight Bearing: Weight bearing as tolerated    Mobility  Bed Mobility Overal bed mobility: Needs Assistance Bed Mobility: Supine to Sit     Supine to sit: Mod assist;+2 for safety/equipment;+2 for physical assistance     General bed mobility comments: needs assist for B LEs and trunk control. ONce seated at EOB, pt able to sit with upright posture decreasing assist to cga. Bed elevated prior to transfer to standing  Transfers Overall transfer level: Needs assistance Equipment used: Rolling walker (2 wheeled) Transfers: Sit to/from Stand Sit to Stand: Min assist;+2  physical assistance;From elevated surface         General transfer comment: Pt with improved ability to tolerate standing this date. Upright posture noted and RW used.   Ambulation/Gait Ambulation/Gait assistance: Min assist;+2 physical assistance Gait Distance (Feet): 3 Feet Assistive device: Rolling walker (2 wheeled) Gait Pattern/deviations: Step-to pattern Gait velocity: decrease   General Gait Details: decreased stance time noted on L Leg and needs cues for safety as he tries to sit prior to being infront of chair. Able to take small steps over to recliner. +2 used for Barrister[employer]    Modified Rankin (Stroke Patients Only)       Balance                                            Cognition Arousal/Alertness: Awake/alert Behavior During Therapy: WFL for tasks assessed/performed Overall Cognitive Status: Impaired/Different from baseline                                 General Comments: upon arrival, pt with both legs off trying to exit bed. No alarm on and pt naked. Tried to reorient and explain safety. Pt had disconnected lines/leads.      Exercises Total Joint Exercises Goniometric ROM: L knee AAROM: 7-85 degrees Other Exercises Other Exercises: Supine ther-ex  performed on L LE including ankle pumps, quad sets, SLRs, hip abd/add, and SAQ. All ther-ex performed x 15 reps with min/mod assist and heavy cues for sequencing and technique.    General Comments        Pertinent Vitals/Pain Pain Assessment: 0-10 Pain Score: 3  Pain Location: L knee Pain Descriptors / Indicators: Guarding;Sore;Aching;Grimacing Pain Intervention(s): Limited activity within patient's tolerance;Repositioned;Ice applied    Home Living                      Prior Function            PT Goals (current goals can now be found in the care plan section) Acute Rehab PT Goals Patient Stated Goal: go to rehab  and get better so I can go home PT Goal Formulation: With patient Time For Goal Achievement: 10/22/17 Potential to Achieve Goals: Good Progress towards PT goals: Progressing toward goals    Frequency    BID      PT Plan Current plan remains appropriate    Co-evaluation              AM-PAC PT "6 Clicks" Daily Activity  Outcome Measure  Difficulty turning over in bed (including adjusting bedclothes, sheets and blankets)?: Unable Difficulty moving from lying on back to sitting on the side of the bed? : Unable Difficulty sitting down on and standing up from a chair with arms (e.g., wheelchair, bedside commode, etc,.)?: Unable Help needed moving to and from a bed to chair (including a wheelchair)?: A Lot Help needed walking in hospital room?: A Lot Help needed climbing 3-5 steps with a railing? : Total 6 Click Score: 8    End of Session Equipment Utilized During Treatment: Gait belt;Oxygen Activity Tolerance: Patient tolerated treatment well Patient left: in chair;with chair alarm set Nurse Communication: Mobility status PT Visit Diagnosis: Other abnormalities of gait and mobility (R26.89);Muscle weakness (generalized) (M62.81);Difficulty in walking, not elsewhere classified (R26.2);Pain Pain - Right/Left: Left Pain - part of body: Knee     Time: 1010-1048 PT Time Calculation (min) (ACUTE ONLY): 38 min  Charges:  $Gait Training: 8-22 mins $Therapeutic Exercise: 23-37 mins                     Greggory Stallion, PT, DPT [phone]    Suleika Donavan 10/11/2017, 12:05 PM

## 2017-10-11 NOTE — Progress Notes (Signed)
Per MD patient will likely D/C over the weekend. Plan is for patient to D/C to [facility], [place] [facility]. Per [facility] admissions coordinator at [facility] patient can come over the weekend and [facility] authorization was received on 10/10/17 and is good for 7 days. Clinical Education officer, museum (CSW) sent D/C summary to [facility] via [facility] Thursday 10/10/17. CSW will continue to follow and assist as needed.   [employer], LCSW [phone]

## 2017-10-11 NOTE — Care Management Important Message (Signed)
Important Message  Patient Details  Name: Todd Spencer MRN: [id number] Date of Birth: Jul 08, 1950   Medicare Important Message Given:  Yes    Juliann Pulse A Sheletha Bow 10/11/2017, 1:25 PM

## 2017-10-11 NOTE — Progress Notes (Signed)
Patient is confused, constantly pulling off oxygen and spo2 dropping into 80's with multiple reminders.  Setting off bed alarm trying to get up to void.  Patient is very weak and can barely situp straight without falling over.  Condom cath in place due to frequency of voiding.
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
## 2017-10-12 ENCOUNTER — Inpatient Hospital Stay: Payer: Medicare HMO

## 2017-10-12 DIAGNOSIS — E1159 Type 2 diabetes mellitus with other circulatory complications: Secondary | ICD-10-CM | POA: Diagnosis not present

## 2017-10-12 DIAGNOSIS — Z7401 Bed confinement status: Secondary | ICD-10-CM | POA: Diagnosis not present

## 2017-10-12 DIAGNOSIS — M1712 Unilateral primary osteoarthritis, left knee: Secondary | ICD-10-CM | POA: Diagnosis not present

## 2017-10-12 DIAGNOSIS — Z789 Other specified health status: Secondary | ICD-10-CM | POA: Diagnosis not present

## 2017-10-12 DIAGNOSIS — Z7982 Long term (current) use of aspirin: Secondary | ICD-10-CM | POA: Diagnosis not present

## 2017-10-12 DIAGNOSIS — G4733 Obstructive sleep apnea (adult) (pediatric): Secondary | ICD-10-CM | POA: Diagnosis not present

## 2017-10-12 DIAGNOSIS — Z794 Long term (current) use of insulin: Secondary | ICD-10-CM | POA: Diagnosis not present

## 2017-10-12 DIAGNOSIS — Z96652 Presence of left artificial knee joint: Secondary | ICD-10-CM | POA: Diagnosis not present

## 2017-10-12 DIAGNOSIS — N183 Chronic kidney disease, stage 3 (moderate): Secondary | ICD-10-CM | POA: Diagnosis not present

## 2017-10-12 DIAGNOSIS — R52 Pain, unspecified: Secondary | ICD-10-CM | POA: Diagnosis not present

## 2017-10-12 DIAGNOSIS — I255 Ischemic cardiomyopathy: Secondary | ICD-10-CM | POA: Diagnosis not present

## 2017-10-12 DIAGNOSIS — Z471 Aftercare following joint replacement surgery: Secondary | ICD-10-CM | POA: Diagnosis not present

## 2017-10-12 DIAGNOSIS — E1140 Type 2 diabetes mellitus with diabetic neuropathy, unspecified: Secondary | ICD-10-CM | POA: Diagnosis not present

## 2017-10-12 DIAGNOSIS — F329 Major depressive disorder, single episode, unspecified: Secondary | ICD-10-CM | POA: Diagnosis not present

## 2017-10-12 DIAGNOSIS — J9601 Acute respiratory failure with hypoxia: Secondary | ICD-10-CM | POA: Diagnosis not present

## 2017-10-12 DIAGNOSIS — M6281 Muscle weakness (generalized): Secondary | ICD-10-CM | POA: Diagnosis not present

## 2017-10-12 DIAGNOSIS — I2510 Atherosclerotic heart disease of native coronary artery without angina pectoris: Secondary | ICD-10-CM | POA: Diagnosis not present

## 2017-10-12 DIAGNOSIS — I10 Essential (primary) hypertension: Secondary | ICD-10-CM | POA: Diagnosis not present

## 2017-10-12 DIAGNOSIS — M25562 Pain in left knee: Secondary | ICD-10-CM | POA: Diagnosis not present

## 2017-10-12 DIAGNOSIS — E1122 Type 2 diabetes mellitus with diabetic chronic kidney disease: Secondary | ICD-10-CM | POA: Diagnosis not present

## 2017-10-12 DIAGNOSIS — M25662 Stiffness of left knee, not elsewhere classified: Secondary | ICD-10-CM | POA: Diagnosis not present

## 2017-10-12 DIAGNOSIS — R0603 Acute respiratory distress: Secondary | ICD-10-CM | POA: Diagnosis not present

## 2017-10-12 DIAGNOSIS — I252 Old myocardial infarction: Secondary | ICD-10-CM | POA: Diagnosis not present

## 2017-10-12 DIAGNOSIS — K567 Ileus, unspecified: Secondary | ICD-10-CM | POA: Diagnosis not present

## 2017-10-12 DIAGNOSIS — R4182 Altered mental status, unspecified: Secondary | ICD-10-CM | POA: Diagnosis not present

## 2017-10-12 DIAGNOSIS — E1142 Type 2 diabetes mellitus with diabetic polyneuropathy: Secondary | ICD-10-CM | POA: Diagnosis not present

## 2017-10-12 LAB — BASIC METABOLIC PANEL
Anion gap: 7 (ref 5–15)
BUN: 27 mg/dL — ABNORMAL HIGH (ref 8–23)
CHLORIDE: 100 mmol/L (ref 98–111)
CO2: 30 mmol/L (ref 22–32)
CREATININE: 1.2 mg/dL (ref 0.61–1.24)
Calcium: 8.9 mg/dL (ref 8.9–10.3)
Glucose, Bld: 147 mg/dL — ABNORMAL HIGH (ref 70–99)
POTASSIUM: 4.4 mmol/L (ref 3.5–5.1)
SODIUM: 137 mmol/L (ref 135–145)

## 2017-10-12 LAB — CBC
HCT: 27.5 % — ABNORMAL LOW (ref 40.0–52.0)
HEMOGLOBIN: 9.3 g/dL — AB (ref 13.0–18.0)
MCH: 25.7 pg — ABNORMAL LOW (ref 26.0–34.0)
MCHC: 33.9 g/dL (ref 32.0–36.0)
MCV: 75.8 fL — ABNORMAL LOW (ref 80.0–100.0)
PLATELETS: 128 10*3/uL — AB (ref 150–440)
RBC: 3.63 MIL/uL — AB (ref 4.40–5.90)
RDW: 18.7 % — ABNORMAL HIGH (ref 11.5–14.5)
WBC: 8 10*3/uL (ref 3.8–10.6)

## 2017-10-12 LAB — RPR: RPR: NONREACTIVE

## 2017-10-12 LAB — HIV ANTIBODY (ROUTINE TESTING W REFLEX): HIV SCREEN 4TH GENERATION: NONREACTIVE

## 2017-10-12 LAB — GLUCOSE, CAPILLARY
GLUCOSE-CAPILLARY: 163 mg/dL — AB (ref 70–99)
GLUCOSE-CAPILLARY: 192 mg/dL — AB (ref 70–99)
Glucose-Capillary: 188 mg/dL — ABNORMAL HIGH (ref 70–99)

## 2017-10-12 MED ORDER — IOPAMIDOL (ISOVUE-370) INJECTION 76%
80.0000 mL | Freq: Once | INTRAVENOUS | Status: AC | PRN
  Administered 2017-10-12: 80 mL via INTRAVENOUS

## 2017-10-12 NOTE — Progress Notes (Signed)
[facility] NAME: Todd Spencer    MR#:  [id number]  DATE OF BIRTH:  May 08, 1950  SUBJECTIVE:  CHIEF COMPLAINT:  No chief complaint on file.  - alert and oriented, intermittent confusion noted.  REVIEW OF SYSTEMS:  Review of Systems  Constitutional: Negative for chills and fever.  HENT: Negative for congestion, ear discharge, hearing loss and nosebleeds.   Eyes: Negative for blurred vision and double vision.  Respiratory: Negative for cough, shortness of breath and wheezing.   Cardiovascular: Negative for chest pain and palpitations.  Gastrointestinal: Negative for abdominal pain, constipation, diarrhea, nausea and vomiting.  Genitourinary: Negative for dysuria.  Musculoskeletal: Positive for joint pain and myalgias.  Neurological: Negative for dizziness, focal weakness, seizures, weakness and headaches.  Psychiatric/Behavioral: Negative for depression.    DRUG ALLERGIES:  No Known Allergies  VITALS:  Blood pressure (!) 142/88, pulse 94, temperature 99.3 F (37.4 C), temperature source Oral, resp. rate 20, height 5\' 11"  (1.803 m), weight 107.3 kg, SpO2 93 %.  PHYSICAL EXAMINATION:  Physical Exam   GENERAL:  67 y.o.-year-old patient lying in the bed with no acute distress.  EYES: Pupils equal, round, reactive to light and accommodation. No scleral icterus. Extraocular muscles intact.  HEENT: Head atraumatic, normocephalic. Oropharynx and nasopharynx clear.  NECK:  Supple, no jugular venous distention. No thyroid enlargement, no tenderness.  LUNGS: Normal breath sounds bilaterally, no wheezing, rales,rhonchi or crepitation. No use of accessory muscles of respiration. Decreased bibasilar breath sounds CARDIOVASCULAR: S1, S2 normal. No murmurs, rubs, or gallops.  ABDOMEN: Soft, nontender, nondistended. Bowel sounds present. No organomegaly or mass.  EXTREMITIES: No pedal edema, cyanosis, or clubbing. Left knee in a soft  brace NEUROLOGIC: Cranial nerves II through XII are intact. Muscle strength 5/5 in all extremities. Sensation intact. Gait not checked.  PSYCHIATRIC: The patient is alert and oriented x 3. Intermittent confusion noted. SKIN: No obvious rash, lesion, or ulcer.    LABORATORY PANEL:   CBC Recent Labs  Lab 10/12/17 0434  WBC 8.0  HGB 9.3*  HCT 27.5*  PLT 128*   ------------------------------------------------------------------------------------------------------------------  Chemistries  Recent Labs  Lab 10/10/17 2032 10/12/17 0434  NA 134* 137  K 4.2 4.4  CL 101 100  CO2 26 30  GLUCOSE 80 147*  BUN 24* 27*  CREATININE 1.39* 1.20  CALCIUM 8.9 8.9  AST 35  --   ALT 20  --   ALKPHOS 56  --   BILITOT 0.9  --    ------------------------------------------------------------------------------------------------------------------  Cardiac Enzymes Recent Labs  Lab 10/10/17 2032  TROPONINI <0.03   ------------------------------------------------------------------------------------------------------------------  RADIOLOGY:  Ct Head Wo Contrast  Result Date: 10/10/2017 CLINICAL DATA:  Altered mental status.  Lethargy. EXAM: CT HEAD WITHOUT CONTRAST TECHNIQUE: Contiguous axial images were obtained from the base of the skull through the vertex without intravenous contrast. COMPARISON:  Head CT 09/11/2011, 02/22/2006 FINDINGS: Brain: No intracranial hemorrhage or evidence of acute ischemia. Generalized atrophy with the bifrontal and right greater than left predominance, unchanged from prior exams. Right frontal extra-axial CSF prominence extending along the falx is chronic and unchanged, may be secondary to asymmetric atrophy or chronic hygroma. Minimal chronic rightward midline shift of the lateral ventricles. No hydrocephalus, the basilar cisterns are patent. Vascular: Atherosclerosis of skullbase vasculature without hyperdense vessel or abnormal calcification. Skull: No fracture or  focal lesion. Sinuses/Orbits: Paranasal sinuses and mastoid air cells are clear. The visualized orbits are unremarkable. Bilateral cataract resection. Other: None. IMPRESSION:  1.  No acute intracranial abnormality. 2. Unchanged atrophy, most prominent in the bifrontal regions. Electronically Signed   By: Jeb Levering M.D.   On: 10/10/2017 21:11   Dg Chest Port 1 View  Result Date: 10/10/2017 CLINICAL DATA:  Weakness EXAM: PORTABLE CHEST 1 VIEW COMPARISON:  01/04/2017, CT chest 10/16/2016 FINDINGS: No focal opacity or pleural effusion. Mild cardiomegaly. Enlarged mediastinal silhouette, likely augmented by portable supine technique. No pneumothorax. IMPRESSION: No active disease. Cardiomegaly. Enlarged mediastinal silhouette felt largely due to lordotic positioning and AP portable technique. Electronically Signed   By: Donavan Foil M.D.   On: 10/10/2017 21:04   Dg Abd Portable 1v  Result Date: 10/10/2017 CLINICAL DATA:  Abdominal distension EXAM: PORTABLE ABDOMEN - 1 VIEW COMPARISON:  01/05/2017 FINDINGS: Surgical clips near the GE junction. Gas-filled slightly dilated small bowel measuring up to 4 cm with mild gaseous enlargement of the colon. Moderate stool in the colon. No abnormal calcification. IMPRESSION: Gaseous distension of small and large bowel suggestive of an ileus. Moderate stool in the colon. Electronically Signed   By: Donavan Foil M.D.   On: 10/10/2017 21:06    EKG:   Orders placed or performed during the hospital encounter of 10/08/17  . EKG 12-Lead  . EKG 12-Lead    ASSESSMENT AND PLAN:   67 year old male with past medical history significant for CAD, ischemic cardiomyopathy, systolic CHF, CKD stage III, diabetes and hypertension presents to hospital secondary to elective left total knee replacement surgery.  Medical consult requested for hypoxia and altered mental status.  1.  Altered mental status-noted by RN that patient does have known history of mild cognitive  deficit/dementia -Is alert and oriented today with intermittent confusion.  Pain medicines and anticholinergics on hold.  All the work-up has been negative so far. -no source of Infection identified  2.  Acute hypoxia after surgery- elevated d-dimer -Could have been hypoventilation. -However due to surgery, should r/o PE- CT chest stat ordered  3. Anemia of chronic disease- stable  4. DM-on Lantus, NovoLog insulin and sliding scale insulin.  5.  CAD status post stent/ischemic cardiomyopathy-stable.  Continue cardiac medications  6. DVT Prophylaxis- only on asa, encourage ambulation TEDs and SCDs   Patient will be going to rehab today - if CT chest is negative   All the records are reviewed and case discussed with Care Management/Social Workerr. Management plans discussed with the patient, family and they are in agreement.  CODE STATUS: Full Code  TOTAL TIME TAKING CARE OF THIS PATIENT: 28 minutes.   POSSIBLE D/C TODAY, DEPENDING ON CLINICAL CONDITION.   Gladstone Lighter M.D on 10/12/2017 at 11:23 AM  Between 7am to 6pm - Pager - [phone]  After 6pm go to [email or web address] - password [email or web address] [facility]  Office  [phone]  CC: Primary care physician; [place] Pink, MD

## 2017-10-12 NOTE — Progress Notes (Signed)
Verbal order received by MD Tressia Miners pt ok for d/c. (CT negative).
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
## 2017-10-12 NOTE — Progress Notes (Signed)
EMS called for transportation.  

## 2017-10-12 NOTE — Progress Notes (Signed)
EMS here to transport pt. 

## 2017-10-12 NOTE — Clinical Social Work Note (Addendum)
The patient will discharge today to [facility]. The CSW has attempted to contact his emergency contacts with no answer and no ability to leave a HIPPA compliant voicemail. The patient is alert but not oriented past self and place. The patient has also had impulsive movements that would make him unsafe to travel by POV. The CSW will assist in delivering the patient's personal items to [facility] as he will most likely travel via EMS, and no family has been available to transport the patient's belongings. The CSW will deliver the packet to the chart when able, and all discharge information has been sent to the facility. The CSW will sign off once the personal belongings have been delivered to the facility.  UPDATE: The patient pleasantly refuses to give contact information for his neighbor, Charlotte Crumb, or his son, Evelena Peat. The patient has given the CSW permission to transport his belongings to [facility].  Santiago Bumpers, MSW, Latanya Presser [phone]

## 2017-10-12 NOTE — Progress Notes (Signed)
Physical Therapy Treatment Patient Details Name: Todd Spencer MRN: [id number] DOB: [birth year] Today's Date: 10/12/2017    History of Present Illness Pt is a 67 y.o. male s/p L TKA secondary OA 10/08/17.  PMH includes pacemaker 07/2014, sleep apnea, h/o smoking, htn, CAD, MI, CABG, CHF, cardiac stents, (+) angina with exertion, DM, hiatal hernia, CKD, DDD, HLD, and Alzheimer's and vascular dementia. Of note, pt with rapid response last night secondary to respiratory distress. Currently on room air throughout session.    PT Comments    Participated in exercises as described below.  Deferred mobility as pt in bed awaiting transfer to rehab.    Follow Up Recommendations  SNF     Equipment Recommendations       Recommendations for Other Services       Precautions / Restrictions Precautions Precautions: Fall;Knee Restrictions Weight Bearing Restrictions: Yes LLE Weight Bearing: Weight bearing as tolerated    Mobility  Bed Mobility               General bed mobility comments: deferred - awaiting SNF transfer  Transfers                    Ambulation/Gait                 Stairs             Wheelchair Mobility    Modified Rankin (Stroke Patients Only)       Balance                                            Cognition Arousal/Alertness: Awake/alert Behavior During Therapy: WFL for tasks assessed/performed Overall Cognitive Status: Impaired/Different from baseline                                        Exercises Other Exercises Other Exercises: Supine ther-ex performed on L LE including ankle pumps, quad sets, SLRs, hip abd/add, and SAQ. All ther-ex performed x 15 reps with min/mod assist and heavy cues for sequencing and technique.    General Comments        Pertinent Vitals/Pain Pain Assessment: Faces Faces Pain Scale: Hurts little more Pain Location: L knee - with movement Pain Descriptors /  Indicators: Sore;Operative site guarding Pain Intervention(s): Limited activity within patient's tolerance;Monitored during session    Home Living                      Prior Function            PT Goals (current goals can now be found in the care plan section) Progress towards PT goals: Progressing toward goals    Frequency    BID      PT Plan Current plan remains appropriate    Co-evaluation              AM-PAC PT "6 Clicks" Daily Activity  Outcome Measure  Difficulty turning over in bed (including adjusting bedclothes, sheets and blankets)?: Unable Difficulty moving from lying on back to sitting on the side of the bed? : Unable Difficulty sitting down on and standing up from a chair with arms (e.g., wheelchair, bedside commode, etc,.)?: Unable Help needed moving to and from a bed to chair (including a  wheelchair)?: A Lot Help needed walking in hospital room?: A Lot Help needed climbing 3-5 steps with a railing? : Total 6 Click Score: 8    End of Session   Activity Tolerance: Patient tolerated treatment well Patient left: in bed;with bed alarm set;with call bell/phone within reach Nurse Communication: Other (comment) Pain - Right/Left: Left Pain - part of body: Knee     Time: 0383-3383 PT Time Calculation (min) (ACUTE ONLY): 8 min  Charges:  $Therapeutic Exercise: 8-22 mins                     Chesley Noon, PTA 10/12/17, 11:44 AM

## 2017-10-14 DIAGNOSIS — Z794 Long term (current) use of insulin: Secondary | ICD-10-CM | POA: Diagnosis not present

## 2017-10-14 DIAGNOSIS — E1159 Type 2 diabetes mellitus with other circulatory complications: Secondary | ICD-10-CM | POA: Diagnosis not present

## 2017-10-14 DIAGNOSIS — M1712 Unilateral primary osteoarthritis, left knee: Secondary | ICD-10-CM | POA: Diagnosis not present

## 2017-10-14 DIAGNOSIS — I2510 Atherosclerotic heart disease of native coronary artery without angina pectoris: Secondary | ICD-10-CM | POA: Diagnosis not present

## 2017-10-14 DIAGNOSIS — G4733 Obstructive sleep apnea (adult) (pediatric): Secondary | ICD-10-CM | POA: Diagnosis not present

## 2017-10-14 DIAGNOSIS — E1122 Type 2 diabetes mellitus with diabetic chronic kidney disease: Secondary | ICD-10-CM | POA: Diagnosis not present

## 2017-10-14 DIAGNOSIS — I255 Ischemic cardiomyopathy: Secondary | ICD-10-CM | POA: Diagnosis not present

## 2017-10-14 DIAGNOSIS — E1142 Type 2 diabetes mellitus with diabetic polyneuropathy: Secondary | ICD-10-CM | POA: Diagnosis not present

## 2017-10-14 DIAGNOSIS — N183 Chronic kidney disease, stage 3 (moderate): Secondary | ICD-10-CM | POA: Diagnosis not present

## 2017-10-23 DIAGNOSIS — R52 Pain, unspecified: Secondary | ICD-10-CM | POA: Diagnosis not present

## 2017-10-23 DIAGNOSIS — M25562 Pain in left knee: Secondary | ICD-10-CM | POA: Diagnosis not present

## 2017-10-23 DIAGNOSIS — Z789 Other specified health status: Secondary | ICD-10-CM | POA: Diagnosis not present

## 2017-10-23 DIAGNOSIS — Z471 Aftercare following joint replacement surgery: Secondary | ICD-10-CM | POA: Diagnosis not present

## 2017-10-23 DIAGNOSIS — Z96652 Presence of left artificial knee joint: Secondary | ICD-10-CM | POA: Diagnosis not present

## 2017-10-23 DIAGNOSIS — M6281 Muscle weakness (generalized): Secondary | ICD-10-CM | POA: Diagnosis not present

## 2017-10-23 DIAGNOSIS — M25662 Stiffness of left knee, not elsewhere classified: Secondary | ICD-10-CM | POA: Diagnosis not present

## 2017-10-23 DIAGNOSIS — I10 Essential (primary) hypertension: Secondary | ICD-10-CM | POA: Diagnosis not present

## 2017-10-30 DIAGNOSIS — Z96652 Presence of left artificial knee joint: Secondary | ICD-10-CM | POA: Diagnosis not present

## 2017-10-30 DIAGNOSIS — M25562 Pain in left knee: Secondary | ICD-10-CM | POA: Diagnosis not present

## 2017-11-07 DIAGNOSIS — M25562 Pain in left knee: Secondary | ICD-10-CM | POA: Diagnosis not present

## 2017-11-12 DIAGNOSIS — Z96652 Presence of left artificial knee joint: Secondary | ICD-10-CM | POA: Diagnosis not present

## 2017-11-13 DIAGNOSIS — G4733 Obstructive sleep apnea (adult) (pediatric): Secondary | ICD-10-CM | POA: Diagnosis not present

## 2017-11-20 DIAGNOSIS — Z96652 Presence of left artificial knee joint: Secondary | ICD-10-CM | POA: Diagnosis not present

## 2017-11-20 DIAGNOSIS — M25662 Stiffness of left knee, not elsewhere classified: Secondary | ICD-10-CM | POA: Diagnosis not present

## 2017-11-20 DIAGNOSIS — M25562 Pain in left knee: Secondary | ICD-10-CM | POA: Diagnosis not present

## 2017-11-20 DIAGNOSIS — M6281 Muscle weakness (generalized): Secondary | ICD-10-CM | POA: Diagnosis not present

## 2017-11-25 DIAGNOSIS — E118 Type 2 diabetes mellitus with unspecified complications: Secondary | ICD-10-CM | POA: Diagnosis not present

## 2017-11-25 DIAGNOSIS — N183 Chronic kidney disease, stage 3 (moderate): Secondary | ICD-10-CM | POA: Diagnosis not present

## 2017-11-28 DIAGNOSIS — E1165 Type 2 diabetes mellitus with hyperglycemia: Secondary | ICD-10-CM | POA: Diagnosis not present

## 2017-11-28 DIAGNOSIS — Z794 Long term (current) use of insulin: Secondary | ICD-10-CM | POA: Diagnosis not present

## 2017-11-29 DIAGNOSIS — E785 Hyperlipidemia, unspecified: Secondary | ICD-10-CM | POA: Diagnosis not present

## 2017-12-05 DIAGNOSIS — E782 Mixed hyperlipidemia: Secondary | ICD-10-CM | POA: Diagnosis not present

## 2017-12-05 DIAGNOSIS — R0602 Shortness of breath: Secondary | ICD-10-CM | POA: Diagnosis not present

## 2017-12-05 DIAGNOSIS — I10 Essential (primary) hypertension: Secondary | ICD-10-CM | POA: Diagnosis not present

## 2017-12-05 DIAGNOSIS — I2510 Atherosclerotic heart disease of native coronary artery without angina pectoris: Secondary | ICD-10-CM | POA: Diagnosis not present

## 2017-12-05 DIAGNOSIS — N183 Chronic kidney disease, stage 3 (moderate): Secondary | ICD-10-CM | POA: Diagnosis not present

## 2017-12-05 DIAGNOSIS — Z9889 Other specified postprocedural states: Secondary | ICD-10-CM | POA: Diagnosis not present

## 2017-12-05 DIAGNOSIS — E1122 Type 2 diabetes mellitus with diabetic chronic kidney disease: Secondary | ICD-10-CM | POA: Diagnosis not present

## 2017-12-05 DIAGNOSIS — E1159 Type 2 diabetes mellitus with other circulatory complications: Secondary | ICD-10-CM | POA: Diagnosis not present

## 2017-12-05 DIAGNOSIS — E113293 Type 2 diabetes mellitus with mild nonproliferative diabetic retinopathy without macular edema, bilateral: Secondary | ICD-10-CM | POA: Diagnosis not present

## 2017-12-05 DIAGNOSIS — Z794 Long term (current) use of insulin: Secondary | ICD-10-CM | POA: Diagnosis not present

## 2017-12-05 DIAGNOSIS — E1142 Type 2 diabetes mellitus with diabetic polyneuropathy: Secondary | ICD-10-CM | POA: Diagnosis not present

## 2018-02-25 DIAGNOSIS — I10 Essential (primary) hypertension: Secondary | ICD-10-CM | POA: Diagnosis not present

## 2018-02-25 DIAGNOSIS — R001 Bradycardia, unspecified: Secondary | ICD-10-CM | POA: Diagnosis not present

## 2018-02-25 DIAGNOSIS — R0602 Shortness of breath: Secondary | ICD-10-CM | POA: Diagnosis not present

## 2018-02-25 DIAGNOSIS — E782 Mixed hyperlipidemia: Secondary | ICD-10-CM | POA: Diagnosis not present

## 2018-02-25 DIAGNOSIS — I5022 Chronic systolic (congestive) heart failure: Secondary | ICD-10-CM | POA: Diagnosis not present

## 2018-02-25 DIAGNOSIS — G4733 Obstructive sleep apnea (adult) (pediatric): Secondary | ICD-10-CM | POA: Diagnosis not present

## 2018-02-25 DIAGNOSIS — R5383 Other fatigue: Secondary | ICD-10-CM | POA: Diagnosis not present

## 2018-03-14 DIAGNOSIS — E1142 Type 2 diabetes mellitus with diabetic polyneuropathy: Secondary | ICD-10-CM | POA: Diagnosis not present

## 2018-03-21 DIAGNOSIS — E1165 Type 2 diabetes mellitus with hyperglycemia: Secondary | ICD-10-CM | POA: Diagnosis not present

## 2018-03-21 DIAGNOSIS — N183 Chronic kidney disease, stage 3 (moderate): Secondary | ICD-10-CM | POA: Diagnosis not present

## 2018-03-21 DIAGNOSIS — E1122 Type 2 diabetes mellitus with diabetic chronic kidney disease: Secondary | ICD-10-CM | POA: Diagnosis not present

## 2018-03-21 DIAGNOSIS — E1142 Type 2 diabetes mellitus with diabetic polyneuropathy: Secondary | ICD-10-CM | POA: Diagnosis not present

## 2018-03-21 DIAGNOSIS — E782 Mixed hyperlipidemia: Secondary | ICD-10-CM | POA: Diagnosis not present

## 2018-03-21 DIAGNOSIS — E113293 Type 2 diabetes mellitus with mild nonproliferative diabetic retinopathy without macular edema, bilateral: Secondary | ICD-10-CM | POA: Diagnosis not present

## 2018-03-21 DIAGNOSIS — Z794 Long term (current) use of insulin: Secondary | ICD-10-CM | POA: Diagnosis not present

## 2018-03-21 DIAGNOSIS — E1159 Type 2 diabetes mellitus with other circulatory complications: Secondary | ICD-10-CM | POA: Diagnosis not present

## 2018-05-08 DIAGNOSIS — E113291 Type 2 diabetes mellitus with mild nonproliferative diabetic retinopathy without macular edema, right eye: Secondary | ICD-10-CM | POA: Diagnosis not present

## 2018-05-26 DIAGNOSIS — I2510 Atherosclerotic heart disease of native coronary artery without angina pectoris: Secondary | ICD-10-CM | POA: Diagnosis not present

## 2018-05-26 DIAGNOSIS — Z9861 Coronary angioplasty status: Secondary | ICD-10-CM | POA: Diagnosis not present

## 2018-05-26 DIAGNOSIS — E782 Mixed hyperlipidemia: Secondary | ICD-10-CM | POA: Diagnosis not present

## 2018-05-26 DIAGNOSIS — R001 Bradycardia, unspecified: Secondary | ICD-10-CM | POA: Diagnosis not present

## 2018-05-26 DIAGNOSIS — I10 Essential (primary) hypertension: Secondary | ICD-10-CM | POA: Diagnosis not present

## 2018-05-26 DIAGNOSIS — E1142 Type 2 diabetes mellitus with diabetic polyneuropathy: Secondary | ICD-10-CM | POA: Diagnosis not present

## 2018-05-26 DIAGNOSIS — R0602 Shortness of breath: Secondary | ICD-10-CM | POA: Diagnosis not present

## 2018-05-28 DIAGNOSIS — E785 Hyperlipidemia, unspecified: Secondary | ICD-10-CM | POA: Diagnosis not present

## 2018-05-28 DIAGNOSIS — Z125 Encounter for screening for malignant neoplasm of prostate: Secondary | ICD-10-CM | POA: Diagnosis not present

## 2018-05-28 DIAGNOSIS — I10 Essential (primary) hypertension: Secondary | ICD-10-CM | POA: Diagnosis not present

## 2018-06-03 DIAGNOSIS — E1159 Type 2 diabetes mellitus with other circulatory complications: Secondary | ICD-10-CM | POA: Diagnosis not present

## 2018-06-03 DIAGNOSIS — E1142 Type 2 diabetes mellitus with diabetic polyneuropathy: Secondary | ICD-10-CM | POA: Diagnosis not present

## 2018-06-03 DIAGNOSIS — E1122 Type 2 diabetes mellitus with diabetic chronic kidney disease: Secondary | ICD-10-CM | POA: Diagnosis not present

## 2018-06-03 DIAGNOSIS — E782 Mixed hyperlipidemia: Secondary | ICD-10-CM | POA: Diagnosis not present

## 2018-06-03 DIAGNOSIS — N183 Chronic kidney disease, stage 3 (moderate): Secondary | ICD-10-CM | POA: Diagnosis not present

## 2018-06-03 DIAGNOSIS — E113293 Type 2 diabetes mellitus with mild nonproliferative diabetic retinopathy without macular edema, bilateral: Secondary | ICD-10-CM | POA: Diagnosis not present

## 2018-06-03 DIAGNOSIS — Z794 Long term (current) use of insulin: Secondary | ICD-10-CM | POA: Diagnosis not present

## 2018-06-03 DIAGNOSIS — E1165 Type 2 diabetes mellitus with hyperglycemia: Secondary | ICD-10-CM | POA: Diagnosis not present

## 2018-06-04 DIAGNOSIS — I2510 Atherosclerotic heart disease of native coronary artery without angina pectoris: Secondary | ICD-10-CM | POA: Diagnosis not present

## 2018-06-04 DIAGNOSIS — Z0000 Encounter for general adult medical examination without abnormal findings: Secondary | ICD-10-CM | POA: Diagnosis not present

## 2018-06-04 DIAGNOSIS — E1142 Type 2 diabetes mellitus with diabetic polyneuropathy: Secondary | ICD-10-CM | POA: Diagnosis not present

## 2018-06-04 DIAGNOSIS — K219 Gastro-esophageal reflux disease without esophagitis: Secondary | ICD-10-CM | POA: Diagnosis not present

## 2018-06-04 DIAGNOSIS — I10 Essential (primary) hypertension: Secondary | ICD-10-CM | POA: Diagnosis not present

## 2018-06-04 DIAGNOSIS — E782 Mixed hyperlipidemia: Secondary | ICD-10-CM | POA: Diagnosis not present

## 2018-06-04 DIAGNOSIS — J309 Allergic rhinitis, unspecified: Secondary | ICD-10-CM | POA: Diagnosis not present

## 2018-06-09 DIAGNOSIS — I10 Essential (primary) hypertension: Secondary | ICD-10-CM | POA: Diagnosis not present

## 2018-07-10 DIAGNOSIS — R413 Other amnesia: Secondary | ICD-10-CM | POA: Diagnosis not present

## 2018-07-10 DIAGNOSIS — M792 Neuralgia and neuritis, unspecified: Secondary | ICD-10-CM | POA: Diagnosis not present

## 2018-07-15 DIAGNOSIS — E785 Hyperlipidemia, unspecified: Secondary | ICD-10-CM | POA: Diagnosis not present

## 2018-07-15 DIAGNOSIS — I252 Old myocardial infarction: Secondary | ICD-10-CM | POA: Diagnosis not present

## 2018-07-15 DIAGNOSIS — G4730 Sleep apnea, unspecified: Secondary | ICD-10-CM | POA: Diagnosis not present

## 2018-07-15 DIAGNOSIS — I110 Hypertensive heart disease with heart failure: Secondary | ICD-10-CM | POA: Diagnosis not present

## 2018-07-15 DIAGNOSIS — I429 Cardiomyopathy, unspecified: Secondary | ICD-10-CM | POA: Diagnosis not present

## 2018-07-15 DIAGNOSIS — I509 Heart failure, unspecified: Secondary | ICD-10-CM | POA: Diagnosis not present

## 2018-07-15 DIAGNOSIS — F0390 Unspecified dementia without behavioral disturbance: Secondary | ICD-10-CM | POA: Diagnosis not present

## 2018-07-15 DIAGNOSIS — I2510 Atherosclerotic heart disease of native coronary artery without angina pectoris: Secondary | ICD-10-CM | POA: Diagnosis not present

## 2018-07-15 DIAGNOSIS — E1140 Type 2 diabetes mellitus with diabetic neuropathy, unspecified: Secondary | ICD-10-CM | POA: Diagnosis not present

## 2018-07-16 DIAGNOSIS — I110 Hypertensive heart disease with heart failure: Secondary | ICD-10-CM | POA: Diagnosis not present

## 2018-07-16 DIAGNOSIS — E785 Hyperlipidemia, unspecified: Secondary | ICD-10-CM | POA: Diagnosis not present

## 2018-07-16 DIAGNOSIS — E1140 Type 2 diabetes mellitus with diabetic neuropathy, unspecified: Secondary | ICD-10-CM | POA: Diagnosis not present

## 2018-07-16 DIAGNOSIS — I2510 Atherosclerotic heart disease of native coronary artery without angina pectoris: Secondary | ICD-10-CM | POA: Diagnosis not present

## 2018-07-16 DIAGNOSIS — G4730 Sleep apnea, unspecified: Secondary | ICD-10-CM | POA: Diagnosis not present

## 2018-07-16 DIAGNOSIS — I509 Heart failure, unspecified: Secondary | ICD-10-CM | POA: Diagnosis not present

## 2018-07-16 DIAGNOSIS — I429 Cardiomyopathy, unspecified: Secondary | ICD-10-CM | POA: Diagnosis not present

## 2018-07-16 DIAGNOSIS — I252 Old myocardial infarction: Secondary | ICD-10-CM | POA: Diagnosis not present

## 2018-07-16 DIAGNOSIS — F0390 Unspecified dementia without behavioral disturbance: Secondary | ICD-10-CM | POA: Diagnosis not present

## 2018-07-18 DIAGNOSIS — F0390 Unspecified dementia without behavioral disturbance: Secondary | ICD-10-CM | POA: Diagnosis not present

## 2018-07-18 DIAGNOSIS — I2510 Atherosclerotic heart disease of native coronary artery without angina pectoris: Secondary | ICD-10-CM | POA: Diagnosis not present

## 2018-07-18 DIAGNOSIS — E1140 Type 2 diabetes mellitus with diabetic neuropathy, unspecified: Secondary | ICD-10-CM | POA: Diagnosis not present

## 2018-07-18 DIAGNOSIS — I252 Old myocardial infarction: Secondary | ICD-10-CM | POA: Diagnosis not present

## 2018-07-18 DIAGNOSIS — I429 Cardiomyopathy, unspecified: Secondary | ICD-10-CM | POA: Diagnosis not present

## 2018-07-18 DIAGNOSIS — E785 Hyperlipidemia, unspecified: Secondary | ICD-10-CM | POA: Diagnosis not present

## 2018-07-18 DIAGNOSIS — I509 Heart failure, unspecified: Secondary | ICD-10-CM | POA: Diagnosis not present

## 2018-07-18 DIAGNOSIS — I110 Hypertensive heart disease with heart failure: Secondary | ICD-10-CM | POA: Diagnosis not present

## 2018-07-18 DIAGNOSIS — G4730 Sleep apnea, unspecified: Secondary | ICD-10-CM | POA: Diagnosis not present

## 2018-07-23 DIAGNOSIS — I252 Old myocardial infarction: Secondary | ICD-10-CM | POA: Diagnosis not present

## 2018-07-23 DIAGNOSIS — G4730 Sleep apnea, unspecified: Secondary | ICD-10-CM | POA: Diagnosis not present

## 2018-07-23 DIAGNOSIS — E785 Hyperlipidemia, unspecified: Secondary | ICD-10-CM | POA: Diagnosis not present

## 2018-07-23 DIAGNOSIS — I2510 Atherosclerotic heart disease of native coronary artery without angina pectoris: Secondary | ICD-10-CM | POA: Diagnosis not present

## 2018-07-23 DIAGNOSIS — I509 Heart failure, unspecified: Secondary | ICD-10-CM | POA: Diagnosis not present

## 2018-07-23 DIAGNOSIS — I110 Hypertensive heart disease with heart failure: Secondary | ICD-10-CM | POA: Diagnosis not present

## 2018-07-23 DIAGNOSIS — E1140 Type 2 diabetes mellitus with diabetic neuropathy, unspecified: Secondary | ICD-10-CM | POA: Diagnosis not present

## 2018-07-23 DIAGNOSIS — F0390 Unspecified dementia without behavioral disturbance: Secondary | ICD-10-CM | POA: Diagnosis not present

## 2018-07-23 DIAGNOSIS — I429 Cardiomyopathy, unspecified: Secondary | ICD-10-CM | POA: Diagnosis not present

## 2018-07-28 DIAGNOSIS — I110 Hypertensive heart disease with heart failure: Secondary | ICD-10-CM | POA: Diagnosis not present

## 2018-07-28 DIAGNOSIS — F0390 Unspecified dementia without behavioral disturbance: Secondary | ICD-10-CM | POA: Diagnosis not present

## 2018-07-28 DIAGNOSIS — E1140 Type 2 diabetes mellitus with diabetic neuropathy, unspecified: Secondary | ICD-10-CM | POA: Diagnosis not present

## 2018-07-28 DIAGNOSIS — I10 Essential (primary) hypertension: Secondary | ICD-10-CM | POA: Diagnosis not present

## 2018-07-28 DIAGNOSIS — E785 Hyperlipidemia, unspecified: Secondary | ICD-10-CM | POA: Diagnosis not present

## 2018-07-28 DIAGNOSIS — N183 Chronic kidney disease, stage 3 (moderate): Secondary | ICD-10-CM | POA: Diagnosis not present

## 2018-07-28 DIAGNOSIS — I2510 Atherosclerotic heart disease of native coronary artery without angina pectoris: Secondary | ICD-10-CM | POA: Diagnosis not present

## 2018-07-28 DIAGNOSIS — I429 Cardiomyopathy, unspecified: Secondary | ICD-10-CM | POA: Diagnosis not present

## 2018-07-28 DIAGNOSIS — I252 Old myocardial infarction: Secondary | ICD-10-CM | POA: Diagnosis not present

## 2018-07-28 DIAGNOSIS — E875 Hyperkalemia: Secondary | ICD-10-CM | POA: Diagnosis not present

## 2018-07-28 DIAGNOSIS — E118 Type 2 diabetes mellitus with unspecified complications: Secondary | ICD-10-CM | POA: Diagnosis not present

## 2018-07-28 DIAGNOSIS — G4730 Sleep apnea, unspecified: Secondary | ICD-10-CM | POA: Diagnosis not present

## 2018-07-28 DIAGNOSIS — I509 Heart failure, unspecified: Secondary | ICD-10-CM | POA: Diagnosis not present

## 2018-08-05 DIAGNOSIS — E785 Hyperlipidemia, unspecified: Secondary | ICD-10-CM | POA: Diagnosis not present

## 2018-08-05 DIAGNOSIS — I2510 Atherosclerotic heart disease of native coronary artery without angina pectoris: Secondary | ICD-10-CM | POA: Diagnosis not present

## 2018-08-05 DIAGNOSIS — F0390 Unspecified dementia without behavioral disturbance: Secondary | ICD-10-CM | POA: Diagnosis not present

## 2018-08-05 DIAGNOSIS — I509 Heart failure, unspecified: Secondary | ICD-10-CM | POA: Diagnosis not present

## 2018-08-05 DIAGNOSIS — G4730 Sleep apnea, unspecified: Secondary | ICD-10-CM | POA: Diagnosis not present

## 2018-08-05 DIAGNOSIS — I110 Hypertensive heart disease with heart failure: Secondary | ICD-10-CM | POA: Diagnosis not present

## 2018-08-05 DIAGNOSIS — E1140 Type 2 diabetes mellitus with diabetic neuropathy, unspecified: Secondary | ICD-10-CM | POA: Diagnosis not present

## 2018-08-05 DIAGNOSIS — I429 Cardiomyopathy, unspecified: Secondary | ICD-10-CM | POA: Diagnosis not present

## 2018-08-05 DIAGNOSIS — I252 Old myocardial infarction: Secondary | ICD-10-CM | POA: Diagnosis not present

## 2018-08-06 DIAGNOSIS — I429 Cardiomyopathy, unspecified: Secondary | ICD-10-CM | POA: Diagnosis not present

## 2018-08-06 DIAGNOSIS — F0390 Unspecified dementia without behavioral disturbance: Secondary | ICD-10-CM | POA: Diagnosis not present

## 2018-08-06 DIAGNOSIS — I110 Hypertensive heart disease with heart failure: Secondary | ICD-10-CM | POA: Diagnosis not present

## 2018-08-06 DIAGNOSIS — I2510 Atherosclerotic heart disease of native coronary artery without angina pectoris: Secondary | ICD-10-CM | POA: Diagnosis not present

## 2018-08-06 DIAGNOSIS — I509 Heart failure, unspecified: Secondary | ICD-10-CM | POA: Diagnosis not present

## 2018-08-06 DIAGNOSIS — E785 Hyperlipidemia, unspecified: Secondary | ICD-10-CM | POA: Diagnosis not present

## 2018-08-06 DIAGNOSIS — E1140 Type 2 diabetes mellitus with diabetic neuropathy, unspecified: Secondary | ICD-10-CM | POA: Diagnosis not present

## 2018-08-06 DIAGNOSIS — G4730 Sleep apnea, unspecified: Secondary | ICD-10-CM | POA: Diagnosis not present

## 2018-08-06 DIAGNOSIS — I252 Old myocardial infarction: Secondary | ICD-10-CM | POA: Diagnosis not present

## 2018-08-12 DIAGNOSIS — R413 Other amnesia: Secondary | ICD-10-CM | POA: Diagnosis not present

## 2018-08-12 DIAGNOSIS — E1149 Type 2 diabetes mellitus with other diabetic neurological complication: Secondary | ICD-10-CM | POA: Diagnosis not present

## 2018-08-13 DIAGNOSIS — I110 Hypertensive heart disease with heart failure: Secondary | ICD-10-CM | POA: Diagnosis not present

## 2018-08-13 DIAGNOSIS — I2510 Atherosclerotic heart disease of native coronary artery without angina pectoris: Secondary | ICD-10-CM | POA: Diagnosis not present

## 2018-08-13 DIAGNOSIS — I429 Cardiomyopathy, unspecified: Secondary | ICD-10-CM | POA: Diagnosis not present

## 2018-08-13 DIAGNOSIS — E1140 Type 2 diabetes mellitus with diabetic neuropathy, unspecified: Secondary | ICD-10-CM | POA: Diagnosis not present

## 2018-08-13 DIAGNOSIS — I509 Heart failure, unspecified: Secondary | ICD-10-CM | POA: Diagnosis not present

## 2018-08-13 DIAGNOSIS — F0390 Unspecified dementia without behavioral disturbance: Secondary | ICD-10-CM | POA: Diagnosis not present

## 2018-08-13 DIAGNOSIS — E785 Hyperlipidemia, unspecified: Secondary | ICD-10-CM | POA: Diagnosis not present

## 2018-08-13 DIAGNOSIS — I252 Old myocardial infarction: Secondary | ICD-10-CM | POA: Diagnosis not present

## 2018-08-13 DIAGNOSIS — G4730 Sleep apnea, unspecified: Secondary | ICD-10-CM | POA: Diagnosis not present

## 2018-08-18 DIAGNOSIS — G4730 Sleep apnea, unspecified: Secondary | ICD-10-CM | POA: Diagnosis not present

## 2018-08-18 DIAGNOSIS — E1140 Type 2 diabetes mellitus with diabetic neuropathy, unspecified: Secondary | ICD-10-CM | POA: Diagnosis not present

## 2018-08-18 DIAGNOSIS — I509 Heart failure, unspecified: Secondary | ICD-10-CM | POA: Diagnosis not present

## 2018-08-18 DIAGNOSIS — I2510 Atherosclerotic heart disease of native coronary artery without angina pectoris: Secondary | ICD-10-CM | POA: Diagnosis not present

## 2018-08-18 DIAGNOSIS — E785 Hyperlipidemia, unspecified: Secondary | ICD-10-CM | POA: Diagnosis not present

## 2018-08-18 DIAGNOSIS — I252 Old myocardial infarction: Secondary | ICD-10-CM | POA: Diagnosis not present

## 2018-08-18 DIAGNOSIS — I110 Hypertensive heart disease with heart failure: Secondary | ICD-10-CM | POA: Diagnosis not present

## 2018-08-18 DIAGNOSIS — F0390 Unspecified dementia without behavioral disturbance: Secondary | ICD-10-CM | POA: Diagnosis not present

## 2018-08-18 DIAGNOSIS — I429 Cardiomyopathy, unspecified: Secondary | ICD-10-CM | POA: Diagnosis not present

## 2018-08-19 DIAGNOSIS — G4733 Obstructive sleep apnea (adult) (pediatric): Secondary | ICD-10-CM | POA: Diagnosis not present

## 2018-09-02 DIAGNOSIS — E1159 Type 2 diabetes mellitus with other circulatory complications: Secondary | ICD-10-CM | POA: Diagnosis not present

## 2018-09-02 DIAGNOSIS — N183 Chronic kidney disease, stage 3 (moderate): Secondary | ICD-10-CM | POA: Diagnosis not present

## 2018-09-02 DIAGNOSIS — E1122 Type 2 diabetes mellitus with diabetic chronic kidney disease: Secondary | ICD-10-CM | POA: Diagnosis not present

## 2018-09-02 DIAGNOSIS — Z794 Long term (current) use of insulin: Secondary | ICD-10-CM | POA: Diagnosis not present

## 2018-09-02 DIAGNOSIS — E782 Mixed hyperlipidemia: Secondary | ICD-10-CM | POA: Diagnosis not present

## 2018-09-02 DIAGNOSIS — E113293 Type 2 diabetes mellitus with mild nonproliferative diabetic retinopathy without macular edema, bilateral: Secondary | ICD-10-CM | POA: Diagnosis not present

## 2018-09-02 DIAGNOSIS — E1142 Type 2 diabetes mellitus with diabetic polyneuropathy: Secondary | ICD-10-CM | POA: Diagnosis not present

## 2018-09-02 DIAGNOSIS — E1165 Type 2 diabetes mellitus with hyperglycemia: Secondary | ICD-10-CM | POA: Diagnosis not present

## 2018-09-03 DIAGNOSIS — G309 Alzheimer's disease, unspecified: Secondary | ICD-10-CM | POA: Diagnosis not present

## 2018-09-03 DIAGNOSIS — G4733 Obstructive sleep apnea (adult) (pediatric): Secondary | ICD-10-CM | POA: Diagnosis not present

## 2018-09-03 DIAGNOSIS — I255 Ischemic cardiomyopathy: Secondary | ICD-10-CM | POA: Diagnosis not present

## 2018-09-03 DIAGNOSIS — Z9889 Other specified postprocedural states: Secondary | ICD-10-CM | POA: Diagnosis not present

## 2018-09-03 DIAGNOSIS — I219 Acute myocardial infarction, unspecified: Secondary | ICD-10-CM | POA: Diagnosis not present

## 2018-09-03 DIAGNOSIS — E782 Mixed hyperlipidemia: Secondary | ICD-10-CM | POA: Diagnosis not present

## 2018-09-03 DIAGNOSIS — E1159 Type 2 diabetes mellitus with other circulatory complications: Secondary | ICD-10-CM | POA: Diagnosis not present

## 2018-09-03 DIAGNOSIS — I2510 Atherosclerotic heart disease of native coronary artery without angina pectoris: Secondary | ICD-10-CM | POA: Diagnosis not present

## 2018-09-03 DIAGNOSIS — I10 Essential (primary) hypertension: Secondary | ICD-10-CM | POA: Diagnosis not present

## 2018-09-05 DIAGNOSIS — I509 Heart failure, unspecified: Secondary | ICD-10-CM | POA: Diagnosis not present

## 2018-09-05 DIAGNOSIS — E785 Hyperlipidemia, unspecified: Secondary | ICD-10-CM | POA: Diagnosis not present

## 2018-09-05 DIAGNOSIS — I2510 Atherosclerotic heart disease of native coronary artery without angina pectoris: Secondary | ICD-10-CM | POA: Diagnosis not present

## 2018-09-05 DIAGNOSIS — I252 Old myocardial infarction: Secondary | ICD-10-CM | POA: Diagnosis not present

## 2018-09-05 DIAGNOSIS — I429 Cardiomyopathy, unspecified: Secondary | ICD-10-CM | POA: Diagnosis not present

## 2018-09-05 DIAGNOSIS — E1140 Type 2 diabetes mellitus with diabetic neuropathy, unspecified: Secondary | ICD-10-CM | POA: Diagnosis not present

## 2018-09-05 DIAGNOSIS — G4730 Sleep apnea, unspecified: Secondary | ICD-10-CM | POA: Diagnosis not present

## 2018-09-05 DIAGNOSIS — F0390 Unspecified dementia without behavioral disturbance: Secondary | ICD-10-CM | POA: Diagnosis not present

## 2018-09-05 DIAGNOSIS — I110 Hypertensive heart disease with heart failure: Secondary | ICD-10-CM | POA: Diagnosis not present

## 2018-09-15 DIAGNOSIS — E113291 Type 2 diabetes mellitus with mild nonproliferative diabetic retinopathy without macular edema, right eye: Secondary | ICD-10-CM | POA: Diagnosis not present

## 2018-09-23 DIAGNOSIS — H34831 Tributary (branch) retinal vein occlusion, right eye, with macular edema: Secondary | ICD-10-CM | POA: Diagnosis not present

## 2018-10-28 DIAGNOSIS — H34831 Tributary (branch) retinal vein occlusion, right eye, with macular edema: Secondary | ICD-10-CM | POA: Diagnosis not present

## 2018-10-29 DIAGNOSIS — H1131 Conjunctival hemorrhage, right eye: Secondary | ICD-10-CM | POA: Diagnosis not present

## 2018-12-01 DIAGNOSIS — H34831 Tributary (branch) retinal vein occlusion, right eye, with macular edema: Secondary | ICD-10-CM | POA: Diagnosis not present

## 2018-12-05 DIAGNOSIS — E113293 Type 2 diabetes mellitus with mild nonproliferative diabetic retinopathy without macular edema, bilateral: Secondary | ICD-10-CM | POA: Diagnosis not present

## 2018-12-05 DIAGNOSIS — Z794 Long term (current) use of insulin: Secondary | ICD-10-CM | POA: Diagnosis not present

## 2018-12-12 DIAGNOSIS — Z794 Long term (current) use of insulin: Secondary | ICD-10-CM | POA: Diagnosis not present

## 2018-12-12 DIAGNOSIS — E1159 Type 2 diabetes mellitus with other circulatory complications: Secondary | ICD-10-CM | POA: Diagnosis not present

## 2018-12-12 DIAGNOSIS — E1142 Type 2 diabetes mellitus with diabetic polyneuropathy: Secondary | ICD-10-CM | POA: Diagnosis not present

## 2018-12-12 DIAGNOSIS — N1831 Chronic kidney disease, stage 3a: Secondary | ICD-10-CM | POA: Diagnosis not present

## 2018-12-12 DIAGNOSIS — E113293 Type 2 diabetes mellitus with mild nonproliferative diabetic retinopathy without macular edema, bilateral: Secondary | ICD-10-CM | POA: Diagnosis not present

## 2018-12-12 DIAGNOSIS — E1121 Type 2 diabetes mellitus with diabetic nephropathy: Secondary | ICD-10-CM | POA: Diagnosis not present

## 2018-12-12 DIAGNOSIS — E1165 Type 2 diabetes mellitus with hyperglycemia: Secondary | ICD-10-CM | POA: Diagnosis not present

## 2019-01-12 DIAGNOSIS — H34831 Tributary (branch) retinal vein occlusion, right eye, with macular edema: Secondary | ICD-10-CM | POA: Diagnosis not present

## 2019-02-23 DIAGNOSIS — H34831 Tributary (branch) retinal vein occlusion, right eye, with macular edema: Secondary | ICD-10-CM | POA: Diagnosis not present

## 2019-03-02 DIAGNOSIS — E118 Type 2 diabetes mellitus with unspecified complications: Secondary | ICD-10-CM | POA: Diagnosis not present

## 2019-03-02 DIAGNOSIS — N183 Chronic kidney disease, stage 3 unspecified: Secondary | ICD-10-CM | POA: Diagnosis not present

## 2019-03-05 DIAGNOSIS — N1831 Chronic kidney disease, stage 3a: Secondary | ICD-10-CM | POA: Diagnosis not present

## 2019-03-05 DIAGNOSIS — E118 Type 2 diabetes mellitus with unspecified complications: Secondary | ICD-10-CM | POA: Diagnosis not present

## 2019-03-05 DIAGNOSIS — I10 Essential (primary) hypertension: Secondary | ICD-10-CM | POA: Diagnosis not present

## 2019-03-05 DIAGNOSIS — E875 Hyperkalemia: Secondary | ICD-10-CM | POA: Diagnosis not present

## 2019-03-10 DIAGNOSIS — R200 Anesthesia of skin: Secondary | ICD-10-CM | POA: Diagnosis not present

## 2019-03-10 DIAGNOSIS — E1159 Type 2 diabetes mellitus with other circulatory complications: Secondary | ICD-10-CM | POA: Diagnosis not present

## 2019-03-10 DIAGNOSIS — M792 Neuralgia and neuritis, unspecified: Secondary | ICD-10-CM | POA: Diagnosis not present

## 2019-03-10 DIAGNOSIS — R413 Other amnesia: Secondary | ICD-10-CM | POA: Diagnosis not present

## 2019-03-10 DIAGNOSIS — R202 Paresthesia of skin: Secondary | ICD-10-CM | POA: Diagnosis not present

## 2019-03-10 DIAGNOSIS — H9193 Unspecified hearing loss, bilateral: Secondary | ICD-10-CM | POA: Diagnosis not present

## 2019-03-17 DIAGNOSIS — Z794 Long term (current) use of insulin: Secondary | ICD-10-CM | POA: Diagnosis not present

## 2019-03-17 DIAGNOSIS — Z9889 Other specified postprocedural states: Secondary | ICD-10-CM | POA: Diagnosis not present

## 2019-03-17 DIAGNOSIS — E1159 Type 2 diabetes mellitus with other circulatory complications: Secondary | ICD-10-CM | POA: Diagnosis not present

## 2019-03-17 DIAGNOSIS — G4733 Obstructive sleep apnea (adult) (pediatric): Secondary | ICD-10-CM | POA: Diagnosis not present

## 2019-03-17 DIAGNOSIS — I255 Ischemic cardiomyopathy: Secondary | ICD-10-CM | POA: Diagnosis not present

## 2019-03-17 DIAGNOSIS — N1831 Chronic kidney disease, stage 3a: Secondary | ICD-10-CM | POA: Diagnosis not present

## 2019-03-17 DIAGNOSIS — I219 Acute myocardial infarction, unspecified: Secondary | ICD-10-CM | POA: Diagnosis not present

## 2019-03-17 DIAGNOSIS — E1121 Type 2 diabetes mellitus with diabetic nephropathy: Secondary | ICD-10-CM | POA: Diagnosis not present

## 2019-03-17 DIAGNOSIS — E1142 Type 2 diabetes mellitus with diabetic polyneuropathy: Secondary | ICD-10-CM | POA: Diagnosis not present

## 2019-03-17 DIAGNOSIS — Z9861 Coronary angioplasty status: Secondary | ICD-10-CM | POA: Diagnosis not present

## 2019-03-17 DIAGNOSIS — E782 Mixed hyperlipidemia: Secondary | ICD-10-CM | POA: Diagnosis not present

## 2019-03-17 DIAGNOSIS — I10 Essential (primary) hypertension: Secondary | ICD-10-CM | POA: Diagnosis not present

## 2019-03-17 DIAGNOSIS — E1165 Type 2 diabetes mellitus with hyperglycemia: Secondary | ICD-10-CM | POA: Diagnosis not present

## 2019-03-17 DIAGNOSIS — E113293 Type 2 diabetes mellitus with mild nonproliferative diabetic retinopathy without macular edema, bilateral: Secondary | ICD-10-CM | POA: Diagnosis not present

## 2019-03-17 DIAGNOSIS — G309 Alzheimer's disease, unspecified: Secondary | ICD-10-CM | POA: Diagnosis not present

## 2019-03-17 DIAGNOSIS — I2510 Atherosclerotic heart disease of native coronary artery without angina pectoris: Secondary | ICD-10-CM | POA: Diagnosis not present

## 2019-03-30 ENCOUNTER — Inpatient Hospital Stay
Admission: EM | Admit: 2019-03-30 | Discharge: 2019-04-02 | DRG: 339 | Disposition: A | Discharge status: Home / Self Care | Payer: Medicare HMO | Attending: Surgery | Admitting: Surgery

## 2019-03-30 ENCOUNTER — Other Ambulatory Visit: Payer: Self-pay

## 2019-03-30 ENCOUNTER — Emergency Department: Payer: Medicare HMO

## 2019-03-30 ENCOUNTER — Encounter: Payer: Self-pay | Admitting: Emergency Medicine

## 2019-03-30 DIAGNOSIS — Z03818 Encounter for observation for suspected exposure to other biological agents ruled out: Secondary | ICD-10-CM | POA: Diagnosis not present

## 2019-03-30 DIAGNOSIS — E785 Hyperlipidemia, unspecified: Secondary | ICD-10-CM | POA: Diagnosis present

## 2019-03-30 DIAGNOSIS — Z79899 Other long term (current) drug therapy: Secondary | ICD-10-CM

## 2019-03-30 DIAGNOSIS — Z951 Presence of aortocoronary bypass graft: Secondary | ICD-10-CM

## 2019-03-30 DIAGNOSIS — Z807 Family history of other malignant neoplasms of lymphoid, hematopoietic and related tissues: Secondary | ICD-10-CM

## 2019-03-30 DIAGNOSIS — I252 Old myocardial infarction: Secondary | ICD-10-CM

## 2019-03-30 DIAGNOSIS — H9190 Unspecified hearing loss, unspecified ear: Secondary | ICD-10-CM | POA: Diagnosis present

## 2019-03-30 DIAGNOSIS — I2510 Atherosclerotic heart disease of native coronary artery without angina pectoris: Secondary | ICD-10-CM | POA: Diagnosis present

## 2019-03-30 DIAGNOSIS — K3533 Acute appendicitis with perforation and localized peritonitis, with abscess: Secondary | ICD-10-CM | POA: Diagnosis not present

## 2019-03-30 DIAGNOSIS — K3580 Unspecified acute appendicitis: Secondary | ICD-10-CM | POA: Diagnosis present

## 2019-03-30 DIAGNOSIS — G4730 Sleep apnea, unspecified: Secondary | ICD-10-CM | POA: Diagnosis present

## 2019-03-30 DIAGNOSIS — Z955 Presence of coronary angioplasty implant and graft: Secondary | ICD-10-CM | POA: Diagnosis not present

## 2019-03-30 DIAGNOSIS — K660 Peritoneal adhesions (postprocedural) (postinfection): Secondary | ICD-10-CM | POA: Diagnosis present

## 2019-03-30 DIAGNOSIS — R1031 Right lower quadrant pain: Secondary | ICD-10-CM | POA: Diagnosis not present

## 2019-03-30 DIAGNOSIS — K37 Unspecified appendicitis: Secondary | ICD-10-CM | POA: Diagnosis not present

## 2019-03-30 DIAGNOSIS — Z87891 Personal history of nicotine dependence: Secondary | ICD-10-CM | POA: Diagnosis not present

## 2019-03-30 DIAGNOSIS — E1122 Type 2 diabetes mellitus with diabetic chronic kidney disease: Secondary | ICD-10-CM | POA: Diagnosis not present

## 2019-03-30 DIAGNOSIS — Z7982 Long term (current) use of aspirin: Secondary | ICD-10-CM

## 2019-03-30 DIAGNOSIS — Z87448 Personal history of other diseases of urinary system: Secondary | ICD-10-CM

## 2019-03-30 DIAGNOSIS — E669 Obesity, unspecified: Secondary | ICD-10-CM | POA: Diagnosis not present

## 2019-03-30 DIAGNOSIS — Z5331 Laparoscopic surgical procedure converted to open procedure: Secondary | ICD-10-CM | POA: Diagnosis not present

## 2019-03-30 DIAGNOSIS — Z8249 Family history of ischemic heart disease and other diseases of the circulatory system: Secondary | ICD-10-CM | POA: Diagnosis not present

## 2019-03-30 DIAGNOSIS — K353 Acute appendicitis with localized peritonitis, without perforation or gangrene: Secondary | ICD-10-CM

## 2019-03-30 DIAGNOSIS — I130 Hypertensive heart and chronic kidney disease with heart failure and stage 1 through stage 4 chronic kidney disease, or unspecified chronic kidney disease: Secondary | ICD-10-CM | POA: Diagnosis not present

## 2019-03-30 DIAGNOSIS — Z794 Long term (current) use of insulin: Secondary | ICD-10-CM

## 2019-03-30 DIAGNOSIS — E1140 Type 2 diabetes mellitus with diabetic neuropathy, unspecified: Secondary | ICD-10-CM | POA: Diagnosis not present

## 2019-03-30 DIAGNOSIS — I5022 Chronic systolic (congestive) heart failure: Secondary | ICD-10-CM | POA: Diagnosis present

## 2019-03-30 DIAGNOSIS — Z20822 Contact with and (suspected) exposure to covid-19: Secondary | ICD-10-CM | POA: Diagnosis present

## 2019-03-30 DIAGNOSIS — G4733 Obstructive sleep apnea (adult) (pediatric): Secondary | ICD-10-CM | POA: Diagnosis not present

## 2019-03-30 DIAGNOSIS — F329 Major depressive disorder, single episode, unspecified: Secondary | ICD-10-CM | POA: Diagnosis present

## 2019-03-30 DIAGNOSIS — Z6833 Body mass index (BMI) 33.0-33.9, adult: Secondary | ICD-10-CM | POA: Diagnosis not present

## 2019-03-30 DIAGNOSIS — Z96652 Presence of left artificial knee joint: Secondary | ICD-10-CM | POA: Diagnosis present

## 2019-03-30 DIAGNOSIS — I429 Cardiomyopathy, unspecified: Secondary | ICD-10-CM | POA: Diagnosis present

## 2019-03-30 DIAGNOSIS — R1110 Vomiting, unspecified: Secondary | ICD-10-CM | POA: Diagnosis not present

## 2019-03-30 DIAGNOSIS — N183 Chronic kidney disease, stage 3 unspecified: Secondary | ICD-10-CM | POA: Diagnosis not present

## 2019-03-30 LAB — URINALYSIS, COMPLETE (UACMP) WITH MICROSCOPIC
Bacteria, UA: NONE SEEN
Bilirubin Urine: NEGATIVE
Glucose, UA: NEGATIVE mg/dL
Hgb urine dipstick: NEGATIVE
Ketones, ur: NEGATIVE mg/dL
Leukocytes,Ua: NEGATIVE
Nitrite: NEGATIVE
Protein, ur: 100 mg/dL — AB
Specific Gravity, Urine: 1.02 (ref 1.005–1.030)
Squamous Epithelial / LPF: NONE SEEN (ref 0–5)
pH: 6 (ref 5.0–8.0)

## 2019-03-30 LAB — CBC
HCT: 40.3 % (ref 39.0–52.0)
Hemoglobin: 13.2 g/dL (ref 13.0–17.0)
MCH: 28.8 pg (ref 26.0–34.0)
MCHC: 32.8 g/dL (ref 30.0–36.0)
MCV: 87.8 fL (ref 80.0–100.0)
Platelets: 141 10*3/uL — ABNORMAL LOW (ref 150–400)
RBC: 4.59 MIL/uL (ref 4.22–5.81)
RDW: 15.1 % (ref 11.5–15.5)
WBC: 10.4 10*3/uL (ref 4.0–10.5)
nRBC: 0 % (ref 0.0–0.2)

## 2019-03-30 LAB — COMPREHENSIVE METABOLIC PANEL
ALT: 56 U/L — ABNORMAL HIGH (ref 0–44)
AST: 52 U/L — ABNORMAL HIGH (ref 15–41)
Albumin: 3.9 g/dL (ref 3.5–5.0)
Alkaline Phosphatase: 57 U/L (ref 38–126)
Anion gap: 9 (ref 5–15)
BUN: 16 mg/dL (ref 8–23)
CO2: 26 mmol/L (ref 22–32)
Calcium: 9 mg/dL (ref 8.9–10.3)
Chloride: 104 mmol/L (ref 98–111)
Creatinine, Ser: 1.29 mg/dL — ABNORMAL HIGH (ref 0.61–1.24)
GFR calc Af Amer: >60 mL/min (ref >60)
GFR calc non Af Amer: 56 mL/min — ABNORMAL LOW (ref >60)
Glucose, Bld: 118 mg/dL — ABNORMAL HIGH (ref 70–99)
Potassium: 3.9 mmol/L (ref 3.5–5.1)
Sodium: 139 mmol/L (ref 135–145)
Total Bilirubin: 0.8 mg/dL (ref 0.3–1.2)
Total Protein: 7 g/dL (ref 6.5–8.1)

## 2019-03-30 LAB — SURGICAL PCR SCREEN
MRSA, PCR: NEGATIVE
Staphylococcus aureus: NEGATIVE

## 2019-03-30 LAB — LIPASE, BLOOD: Lipase: 37 U/L (ref 11–51)

## 2019-03-30 LAB — GLUCOSE, CAPILLARY
Glucose-Capillary: 70 mg/dL (ref 70–99)
Glucose-Capillary: 71 mg/dL (ref 70–99)

## 2019-03-30 LAB — RESPIRATORY PANEL BY RT PCR (FLU A&B, COVID)
Influenza A by PCR: NEGATIVE
Influenza B by PCR: NEGATIVE
SARS Coronavirus 2 by RT PCR: NEGATIVE

## 2019-03-30 LAB — HIV ANTIBODY (ROUTINE TESTING W REFLEX): HIV Screen 4th Generation wRfx: NONREACTIVE

## 2019-03-30 MED ORDER — INSULIN ASPART 100 UNIT/ML ~~LOC~~ SOLN
0.0000 [IU] | Freq: Three times a day (TID) | SUBCUTANEOUS | Status: DC
  Administered 2019-04-01: 11 [IU] via SUBCUTANEOUS
  Administered 2019-04-01: 8 [IU] via SUBCUTANEOUS
  Administered 2019-04-01: 18:00:00 5 [IU] via SUBCUTANEOUS
  Administered 2019-04-02: 10:00:00 3 [IU] via SUBCUTANEOUS
  Administered 2019-04-02: 13:00:00 5 [IU] via SUBCUTANEOUS
  Filled 2019-03-30 (×5): qty 1

## 2019-03-30 MED ORDER — LACTATED RINGERS IV SOLN: INTRAVENOUS | Status: DC

## 2019-03-30 MED ORDER — SODIUM CHLORIDE 0.9 % IV SOLN
1000.0000 mL | Freq: Once | INTRAVENOUS | Status: AC
  Administered 2019-03-30: 1000 mL via INTRAVENOUS

## 2019-03-30 MED ORDER — NITROGLYCERIN 0.4 MG SL SUBL: 0.4000 mg | SUBLINGUAL_TABLET | SUBLINGUAL | Status: DC | PRN

## 2019-03-30 MED ORDER — ONDANSETRON HCL 4 MG/2ML IJ SOLN
4.0000 mg | Freq: Once | INTRAMUSCULAR | Status: AC
  Administered 2019-03-30: 4 mg via INTRAVENOUS
  Filled 2019-03-30: qty 2

## 2019-03-30 MED ORDER — MAGNESIUM HYDROXIDE 400 MG/5ML PO SUSP
15.0000 mL | Freq: Every day | ORAL | Status: DC
  Administered 2019-03-31 – 2019-04-01 (×2): 15 mL via ORAL
  Filled 2019-03-30 (×3): qty 30

## 2019-03-30 MED ORDER — HYDROCODONE-ACETAMINOPHEN 5-325 MG PO TABS
1.0000 | ORAL_TABLET | ORAL | Status: DC | PRN
  Administered 2019-03-30: 2 via ORAL
  Filled 2019-03-30: qty 2

## 2019-03-30 MED ORDER — IOHEXOL 300 MG/ML  SOLN
100.0000 mL | Freq: Once | INTRAMUSCULAR | Status: AC | PRN
  Administered 2019-03-30: 14:00:00 100 mL via INTRAVENOUS
  Filled 2019-03-30: qty 100

## 2019-03-30 MED ORDER — DONEPEZIL HCL 5 MG PO TABS
10.0000 mg | ORAL_TABLET | Freq: Every day | ORAL | Status: DC
  Administered 2019-03-31 – 2019-04-02 (×3): 10 mg via ORAL
  Filled 2019-03-30 (×4): qty 2

## 2019-03-30 MED ORDER — MORPHINE SULFATE (PF) 2 MG/ML IV SOLN
2.0000 mg | INTRAVENOUS | Status: DC | PRN
  Administered 2019-03-30 – 2019-04-01 (×5): 2 mg via INTRAVENOUS
  Filled 2019-03-30 (×5): qty 1

## 2019-03-30 MED ORDER — TRAMADOL HCL 50 MG PO TABS
50.0000 mg | ORAL_TABLET | Freq: Four times a day (QID) | ORAL | Status: DC | PRN
  Administered 2019-03-31 – 2019-04-02 (×3): 50 mg via ORAL
  Filled 2019-03-30 (×4): qty 1

## 2019-03-30 MED ORDER — PREGABALIN 75 MG PO CAPS
300.0000 mg | ORAL_CAPSULE | Freq: Two times a day (BID) | ORAL | Status: DC
  Administered 2019-03-30 – 2019-04-02 (×5): 300 mg via ORAL
  Filled 2019-03-30 (×5): qty 4

## 2019-03-30 MED ORDER — ONDANSETRON 4 MG PO TBDP: 4.0000 mg | ORAL_TABLET | Freq: Four times a day (QID) | ORAL | Status: DC | PRN

## 2019-03-30 MED ORDER — DOXEPIN HCL 100 MG PO CAPS
100.0000 mg | ORAL_CAPSULE | Freq: Every morning | ORAL | Status: DC
  Filled 2019-03-30 (×3): qty 1

## 2019-03-30 MED ORDER — ISOSORBIDE DINITRATE 30 MG PO TABS
30.0000 mg | ORAL_TABLET | Freq: Three times a day (TID) | ORAL | Status: DC
  Administered 2019-03-31 – 2019-04-02 (×6): 30 mg via ORAL
  Filled 2019-03-30 (×11): qty 1

## 2019-03-30 MED ORDER — DOCUSATE SODIUM 100 MG PO CAPS
100.0000 mg | ORAL_CAPSULE | Freq: Two times a day (BID) | ORAL | Status: DC | PRN
  Administered 2019-04-01: 16:00:00 100 mg via ORAL
  Filled 2019-03-30: qty 1

## 2019-03-30 MED ORDER — MORPHINE SULFATE (PF) 4 MG/ML IV SOLN
4.0000 mg | Freq: Once | INTRAVENOUS | Status: AC
  Administered 2019-03-30: 4 mg via INTRAVENOUS
  Filled 2019-03-30: qty 1

## 2019-03-30 MED ORDER — METRONIDAZOLE IN NACL 5-0.79 MG/ML-% IV SOLN
500.0000 mg | Freq: Three times a day (TID) | INTRAVENOUS | Status: DC
  Administered 2019-03-30 – 2019-04-02 (×8): 500 mg via INTRAVENOUS
  Filled 2019-03-30 (×12): qty 100

## 2019-03-30 MED ORDER — GEMFIBROZIL 600 MG PO TABS
600.0000 mg | ORAL_TABLET | Freq: Two times a day (BID) | ORAL | Status: DC
  Filled 2019-03-30 (×2): qty 1

## 2019-03-30 MED ORDER — DOXEPIN HCL 100 MG PO CAPS
200.0000 mg | ORAL_CAPSULE | Freq: Every day | ORAL | Status: DC
  Administered 2019-03-30 – 2019-04-01 (×3): 200 mg via ORAL
  Filled 2019-03-30 (×4): qty 2

## 2019-03-30 MED ORDER — MECLIZINE HCL 25 MG PO TABS
25.0000 mg | ORAL_TABLET | Freq: Two times a day (BID) | ORAL | Status: DC
  Administered 2019-03-30 – 2019-04-02 (×6): 25 mg via ORAL
  Filled 2019-03-30 (×7): qty 1

## 2019-03-30 MED ORDER — ONDANSETRON HCL 4 MG/2ML IJ SOLN: 4.0000 mg | Freq: Four times a day (QID) | INTRAMUSCULAR | Status: DC | PRN

## 2019-03-30 MED ORDER — SODIUM CHLORIDE 0.9 % IV SOLN
2.0000 g | INTRAVENOUS | Status: DC
  Administered 2019-03-30 – 2019-04-01 (×3): 2 g via INTRAVENOUS
  Filled 2019-03-30: qty 2
  Filled 2019-03-30: qty 20
  Filled 2019-03-30 (×2): qty 2

## 2019-03-30 MED ORDER — FAMOTIDINE 20 MG PO TABS
20.0000 mg | ORAL_TABLET | Freq: Two times a day (BID) | ORAL | Status: DC
  Administered 2019-03-30 – 2019-04-02 (×6): 20 mg via ORAL
  Filled 2019-03-30 (×6): qty 1

## 2019-03-30 NOTE — ED Notes (Signed)
[facility] sent to the lab.  
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
## 2019-03-30 NOTE — ED Triage Notes (Signed)
Pt in via POV, reports right lower abdominal pain x 1 days, some N/V/D as well.  NAD noted at this time.
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
## 2019-03-31 ENCOUNTER — Encounter: Payer: Self-pay | Admitting: Surgery

## 2019-03-31 ENCOUNTER — Inpatient Hospital Stay: Payer: Medicare HMO | Admitting: Anesthesiology

## 2019-03-31 ENCOUNTER — Encounter
Admission: EM | Disposition: A | Discharge status: Home / Self Care | Payer: Self-pay | Source: Home / Self Care | Attending: Surgery

## 2019-03-31 HISTORY — PX: APPENDECTOMY: SHX54

## 2019-03-31 LAB — GLUCOSE, CAPILLARY
Glucose-Capillary: 111 mg/dL — ABNORMAL HIGH (ref 70–99)
Glucose-Capillary: 116 mg/dL — ABNORMAL HIGH (ref 70–99)
Glucose-Capillary: 121 mg/dL — ABNORMAL HIGH (ref 70–99)
Glucose-Capillary: 123 mg/dL — ABNORMAL HIGH (ref 70–99)
Glucose-Capillary: 148 mg/dL — ABNORMAL HIGH (ref 70–99)
Glucose-Capillary: 169 mg/dL — ABNORMAL HIGH (ref 70–99)
Glucose-Capillary: 265 mg/dL — ABNORMAL HIGH (ref 70–99)
Glucose-Capillary: 59 mg/dL — ABNORMAL LOW (ref 70–99)

## 2019-03-31 LAB — BASIC METABOLIC PANEL
Anion gap: 8 (ref 5–15)
BUN: 15 mg/dL (ref 8–23)
CO2: 27 mmol/L (ref 22–32)
Calcium: 8.3 mg/dL — ABNORMAL LOW (ref 8.9–10.3)
Chloride: 102 mmol/L (ref 98–111)
Creatinine, Ser: 1.17 mg/dL (ref 0.61–1.24)
GFR calc Af Amer: >60 mL/min (ref >60)
GFR calc non Af Amer: >60 mL/min (ref >60)
Glucose, Bld: 131 mg/dL — ABNORMAL HIGH (ref 70–99)
Potassium: 4.9 mmol/L (ref 3.5–5.1)
Sodium: 137 mmol/L (ref 135–145)

## 2019-03-31 LAB — CBC
HCT: 37.8 % — ABNORMAL LOW (ref 39.0–52.0)
Hemoglobin: 12.5 g/dL — ABNORMAL LOW (ref 13.0–17.0)
MCH: 29.1 pg (ref 26.0–34.0)
MCHC: 33.1 g/dL (ref 30.0–36.0)
MCV: 87.9 fL (ref 80.0–100.0)
Platelets: 103 10*3/uL — ABNORMAL LOW (ref 150–400)
RBC: 4.3 MIL/uL (ref 4.22–5.81)
RDW: 15.3 % (ref 11.5–15.5)
WBC: 10.9 10*3/uL — ABNORMAL HIGH (ref 4.0–10.5)
nRBC: 0 % (ref 0.0–0.2)

## 2019-03-31 LAB — HEMOGLOBIN A1C
Hgb A1c MFr Bld: 7.6 % — ABNORMAL HIGH (ref 4.8–5.6)
Mean Plasma Glucose: 171 mg/dL

## 2019-03-31 LAB — MAGNESIUM: Magnesium: 1.7 mg/dL (ref 1.7–2.4)

## 2019-03-31 LAB — PHOSPHORUS: Phosphorus: 3 mg/dL (ref 2.5–4.6)

## 2019-03-31 SURGERY — APPENDECTOMY, ROBOT-ASSISTED, LAPAROSCOPIC
Anesthesia: General

## 2019-03-31 MED ORDER — FENTANYL CITRATE (PF) 100 MCG/2ML IJ SOLN: 25.0000 ug | INTRAMUSCULAR | Status: DC | PRN

## 2019-03-31 MED ORDER — EPHEDRINE SULFATE 50 MG/ML IJ SOLN
INTRAMUSCULAR | Status: AC
  Filled 2019-03-31: qty 1

## 2019-03-31 MED ORDER — ONDANSETRON HCL 4 MG/2ML IJ SOLN: 4.0000 mg | Freq: Once | INTRAMUSCULAR | Status: DC | PRN

## 2019-03-31 MED ORDER — DEXAMETHASONE SODIUM PHOSPHATE 10 MG/ML IJ SOLN
INTRAMUSCULAR | Status: DC | PRN
  Administered 2019-03-31: 10 mg via INTRAVENOUS

## 2019-03-31 MED ORDER — FENTANYL CITRATE (PF) 100 MCG/2ML IJ SOLN
INTRAMUSCULAR | Status: AC
  Filled 2019-03-31: qty 2

## 2019-03-31 MED ORDER — LIDOCAINE HCL (CARDIAC) PF 100 MG/5ML IV SOSY
PREFILLED_SYRINGE | INTRAVENOUS | Status: DC | PRN
Start: 2019-03-31 — End: 2019-03-31
  Administered 2019-03-31: 100 mg via INTRAVENOUS

## 2019-03-31 MED ORDER — ROCURONIUM BROMIDE 50 MG/5ML IV SOLN
INTRAVENOUS | Status: AC
  Filled 2019-03-31: qty 1

## 2019-03-31 MED ORDER — SODIUM CHLORIDE 0.9 % IV SOLN
INTRAVENOUS | Status: DC | PRN
  Administered 2019-03-31: 60 mL

## 2019-03-31 MED ORDER — PHENYLEPHRINE HCL (PRESSORS) 10 MG/ML IV SOLN
INTRAVENOUS | Status: DC | PRN
  Administered 2019-03-31: 200 ug via INTRAVENOUS
  Administered 2019-03-31: 100 ug via INTRAVENOUS
  Administered 2019-03-31: 200 ug via INTRAVENOUS
  Administered 2019-03-31 (×3): 100 ug via INTRAVENOUS
  Administered 2019-03-31: 300 ug via INTRAVENOUS
  Administered 2019-03-31: 100 ug via INTRAVENOUS

## 2019-03-31 MED ORDER — SUGAMMADEX SODIUM 200 MG/2ML IV SOLN
INTRAVENOUS | Status: DC | PRN
  Administered 2019-03-31: 220 mg via INTRAVENOUS

## 2019-03-31 MED ORDER — PROPOFOL 10 MG/ML IV BOLUS
INTRAVENOUS | Status: AC
  Filled 2019-03-31: qty 20

## 2019-03-31 MED ORDER — PROPOFOL 10 MG/ML IV BOLUS
INTRAVENOUS | Status: DC | PRN
  Administered 2019-03-31: 140 mg via INTRAVENOUS

## 2019-03-31 MED ORDER — SUCCINYLCHOLINE CHLORIDE 20 MG/ML IJ SOLN
INTRAMUSCULAR | Status: DC | PRN
  Administered 2019-03-31: 120 mg via INTRAVENOUS

## 2019-03-31 MED ORDER — ENOXAPARIN SODIUM 40 MG/0.4ML ~~LOC~~ SOLN
40.0000 mg | SUBCUTANEOUS | Status: DC
  Administered 2019-04-01 – 2019-04-02 (×2): 40 mg via SUBCUTANEOUS
  Filled 2019-03-31 (×2): qty 0.4

## 2019-03-31 MED ORDER — SODIUM CHLORIDE 0.9 % IV SOLN
INTRAVENOUS | Status: DC | PRN
  Administered 2019-03-31: 40 ug/min via INTRAVENOUS

## 2019-03-31 MED ORDER — BUPIVACAINE LIPOSOME 1.3 % IJ SUSP
INTRAMUSCULAR | Status: AC
  Filled 2019-03-31: qty 20

## 2019-03-31 MED ORDER — FENTANYL CITRATE (PF) 100 MCG/2ML IJ SOLN
INTRAMUSCULAR | Status: AC
  Administered 2019-03-31: 15:00:00 25 ug via INTRAVENOUS
  Filled 2019-03-31: qty 2

## 2019-03-31 MED ORDER — ONDANSETRON HCL 4 MG/2ML IJ SOLN
INTRAMUSCULAR | Status: DC | PRN
  Administered 2019-03-31: 4 mg via INTRAVENOUS

## 2019-03-31 MED ORDER — ROCURONIUM BROMIDE 100 MG/10ML IV SOLN
INTRAVENOUS | Status: DC | PRN
  Administered 2019-03-31: 20 mg via INTRAVENOUS
  Administered 2019-03-31: 40 mg via INTRAVENOUS
  Administered 2019-03-31: 10 mg via INTRAVENOUS

## 2019-03-31 MED ORDER — DEXAMETHASONE SODIUM PHOSPHATE 10 MG/ML IJ SOLN
INTRAMUSCULAR | Status: AC
  Filled 2019-03-31: qty 1

## 2019-03-31 MED ORDER — BUPIVACAINE HCL (PF) 0.5 % IJ SOLN
INTRAMUSCULAR | Status: DC | PRN
  Administered 2019-03-31: 30 mL

## 2019-03-31 MED ORDER — LIDOCAINE HCL (PF) 1 % IJ SOLN
INTRAMUSCULAR | Status: AC
  Filled 2019-03-31: qty 30

## 2019-03-31 MED ORDER — EPINEPHRINE PF 1 MG/ML IJ SOLN
INTRAMUSCULAR | Status: AC
  Filled 2019-03-31: qty 1

## 2019-03-31 MED ORDER — SODIUM CHLORIDE 0.9 % IV SOLN: INTRAVENOUS | Status: DC | PRN

## 2019-03-31 MED ORDER — SUGAMMADEX SODIUM 500 MG/5ML IV SOLN
INTRAVENOUS | Status: AC
  Filled 2019-03-31: qty 5

## 2019-03-31 MED ORDER — BUPIVACAINE HCL (PF) 0.5 % IJ SOLN
INTRAMUSCULAR | Status: AC
  Filled 2019-03-31: qty 30

## 2019-03-31 MED ORDER — EPHEDRINE SULFATE 50 MG/ML IJ SOLN
INTRAMUSCULAR | Status: DC | PRN
  Administered 2019-03-31: 10 mg via INTRAVENOUS
  Administered 2019-03-31: 5 mg via INTRAVENOUS
  Administered 2019-03-31: 10 mg via INTRAVENOUS
  Administered 2019-03-31: 5 mg via INTRAVENOUS

## 2019-03-31 MED ORDER — FENTANYL CITRATE (PF) 100 MCG/2ML IJ SOLN
INTRAMUSCULAR | Status: DC | PRN
  Administered 2019-03-31 (×2): 50 ug via INTRAVENOUS

## 2019-03-31 MED ORDER — LIDOCAINE HCL (PF) 2 % IJ SOLN
INTRAMUSCULAR | Status: AC
  Filled 2019-03-31: qty 10

## 2019-03-31 MED ORDER — DEXTROSE 50 % IV SOLN
INTRAVENOUS | Status: AC
  Administered 2019-03-31: 25 mL
  Filled 2019-03-31: qty 50

## 2019-03-31 MED ORDER — ONDANSETRON HCL 4 MG/2ML IJ SOLN
INTRAMUSCULAR | Status: AC
  Filled 2019-03-31: qty 2

## 2019-03-31 SURGICAL SUPPLY — 82 items
ADH SKN CLS APL DERMABOND .7 (GAUZE/BANDAGES/DRESSINGS)
APL PRP STRL LF DISP 70% ISPRP (MISCELLANEOUS) ×2
BAG INFUSER PRESSURE 100CC (MISCELLANEOUS) IMPLANT
BLADE SURG SZ11 CARB STEEL (BLADE) ×3 IMPLANT
BULB RESERV EVAC DRAIN JP 100C (MISCELLANEOUS) ×1 IMPLANT
CANISTER SUCT 1200ML W/VALVE (MISCELLANEOUS) ×3 IMPLANT
CANNULA REDUC XI 12-8 STAPL (CANNULA) ×1
CANNULA REDUCER 12-8 DVNC XI (CANNULA) ×2 IMPLANT
CHLORAPREP W/TINT 26 (MISCELLANEOUS) ×3 IMPLANT
COVER TIP SHEARS 8 DVNC (MISCELLANEOUS) ×2 IMPLANT
COVER TIP SHEARS 8MM DA VINCI (MISCELLANEOUS) ×1
COVER WAND RF STERILE (DRAPES) IMPLANT
CUTTER FLEX LINEAR 45M (STAPLE) ×1 IMPLANT
DEFOGGER SCOPE WARMER CLEARIFY (MISCELLANEOUS) ×3 IMPLANT
DERMABOND ADVANCED (GAUZE/BANDAGES/DRESSINGS)
DERMABOND ADVANCED .7 DNX12 (GAUZE/BANDAGES/DRESSINGS) ×2 IMPLANT
DRAIN CHANNEL JP 15F RND 16 (MISCELLANEOUS) ×1 IMPLANT
DRAPE 3/4 80X56 (DRAPES) ×3 IMPLANT
DRAPE ARM DVNC X/XI (DISPOSABLE) ×8 IMPLANT
DRAPE COLUMN DVNC XI (DISPOSABLE) ×2 IMPLANT
DRAPE DA VINCI XI ARM (DISPOSABLE) ×4
DRAPE DA VINCI XI COLUMN (DISPOSABLE) ×1
DRSG OPSITE POSTOP 4X10 (GAUZE/BANDAGES/DRESSINGS) ×1 IMPLANT
ELECT CAUTERY BLADE 6.4 (BLADE) IMPLANT
ELECT REM PT RETURN 9FT ADLT (ELECTROSURGICAL) ×3
ELECTRODE REM PT RTRN 9FT ADLT (ELECTROSURGICAL) ×2 IMPLANT
GLOVE BIOGEL PI IND STRL 7.0 (GLOVE) ×4 IMPLANT
GLOVE BIOGEL PI INDICATOR 7.0 (GLOVE) ×2
GLOVE SURG SYN 6.5 ES PF (GLOVE) ×6 IMPLANT
GLOVE SURG SYN 6.5 PF PI (GLOVE) ×4 IMPLANT
GOWN STRL REUS W/ TWL LRG LVL3 (GOWN DISPOSABLE) ×6 IMPLANT
GOWN STRL REUS W/TWL LRG LVL3 (GOWN DISPOSABLE) ×9
GRASPER SUT TROCAR 14GX15 (MISCELLANEOUS) IMPLANT
HANDLE YANKAUER SUCT BULB TIP (MISCELLANEOUS) ×1 IMPLANT
HEMOSTAT SURGICEL 2X3 (HEMOSTASIS) ×1 IMPLANT
IRRIGATOR SUCT 8 DISP DVNC XI (IRRIGATION / IRRIGATOR) IMPLANT
IRRIGATOR SUCTION 8MM XI DISP (IRRIGATION / IRRIGATOR)
IV NS 1000ML (IV SOLUTION)
IV NS 1000ML BAXH (IV SOLUTION) IMPLANT
KIT PINK PAD W/HEAD ARE REST (MISCELLANEOUS) ×3
KIT PINK PAD W/HEAD ARM REST (MISCELLANEOUS) ×2 IMPLANT
LABEL OR SOLS (LABEL) IMPLANT
NDL INSUFFLATION 14GA 120MM (NEEDLE) ×2 IMPLANT
NEEDLE HYPO 22GX1.5 SAFETY (NEEDLE) ×3 IMPLANT
NEEDLE INSUFFLATION 14GA 120MM (NEEDLE) ×3 IMPLANT
OBTURATOR OPTICAL STANDARD 8MM (TROCAR) ×1
OBTURATOR OPTICAL STND 8 DVNC (TROCAR) ×2
OBTURATOR OPTICALSTD 8 DVNC (TROCAR) ×2 IMPLANT
PACK LAP CHOLECYSTECTOMY (MISCELLANEOUS) ×3 IMPLANT
PENCIL ELECTRO HAND CTR (MISCELLANEOUS) ×3 IMPLANT
RELOAD STAPLE 45 2.5 WHT DVNC (STAPLE) ×2 IMPLANT
RELOAD STAPLE 45 3.5 BLU DVNC (STAPLE) ×2 IMPLANT
RELOAD STAPLE 45 3.5 BLU ETS (ENDOMECHANICALS) IMPLANT
RELOAD STAPLE TA45 3.5 REG BLU (ENDOMECHANICALS) ×3 IMPLANT
RELOAD STAPLER 2.5X45 WHT DVNC (STAPLE) ×2 IMPLANT
RELOAD STAPLER 3.5X45 BLU DVNC (STAPLE) ×2 IMPLANT
SEAL CANN UNIV 5-8 DVNC XI (MISCELLANEOUS) ×6 IMPLANT
SEAL XI 5MM-8MM UNIVERSAL (MISCELLANEOUS) ×3
SOLUTION ELECTROLUBE (MISCELLANEOUS) ×3 IMPLANT
SPONGE LAP 18X18 RF (DISPOSABLE) ×1 IMPLANT
STAPLER 45 DA VINCI SURE FORM (STAPLE) ×1
STAPLER 45 SUREFORM DVNC (STAPLE) ×2 IMPLANT
STAPLER CANNULA SEAL DVNC XI (STAPLE) ×2 IMPLANT
STAPLER CANNULA SEAL XI (STAPLE) ×1
STAPLER RELOAD 2.5X45 WHITE (STAPLE) ×1
STAPLER RELOAD 2.5X45 WHT DVNC (STAPLE) ×2
STAPLER RELOAD 3.5X45 BLU DVNC (STAPLE) ×2
STAPLER RELOAD 3.5X45 BLUE (STAPLE) ×1
STAPLER SKIN PROX 35W (STAPLE) ×1 IMPLANT
SUT ETHILON 3-0 FS-10 30 BLK (SUTURE) ×3
SUT MNCRL AB 4-0 PS2 18 (SUTURE) ×3 IMPLANT
SUT PDS AB 1 CT1 36 (SUTURE) ×2 IMPLANT
SUT VIC AB 2-0 SH 27 (SUTURE) ×3
SUT VIC AB 2-0 SH 27XBRD (SUTURE) ×2 IMPLANT
SUT VIC AB 3-0 SH 27 (SUTURE) ×6
SUT VIC AB 3-0 SH 27X BRD (SUTURE) ×2 IMPLANT
SUT VICRYL 0 AB UR-6 (SUTURE) ×3 IMPLANT
SUTURE EHLN 3-0 FS-10 30 BLK (SUTURE) IMPLANT
SYR 30ML LL (SYRINGE) ×3 IMPLANT
TRAY FOLEY MTR SLVR 16FR STAT (SET/KITS/TRAYS/PACK) ×3 IMPLANT
TROCAR XCEL NON-BLD 5MMX100MML (ENDOMECHANICALS) ×1 IMPLANT
TUBING EVAC SMOKE HEATED PNEUM (TUBING) ×3 IMPLANT

## 2019-03-31 NOTE — Transfer of Care (Signed)
Immediate Anesthesia Transfer of Care Note  Patient: Todd Spencer  Procedure(s) Performed: XI ROBOTIC LAPAROSCOPIC ASSISTED APPENDECTOMY (N/A ) APPENDECTOMY  Patient Location: PACU  Anesthesia Type:General  Level of Consciousness: awake, alert  and oriented  Airway & Oxygen Therapy: Patient connected to face mask oxygen  Post-op Assessment: Post -op Vital signs reviewed and stable  Post vital signs: stable  Last Vitals:  Vitals Value Taken Time  BP 153/85 03/31/19 1442  Temp    Pulse 99 03/31/19 1442  Resp 10 03/31/19 1442  SpO2 97 % 03/31/19 1442  Vitals shown include unvalidated device data.  Last Pain:  Vitals:   03/31/19 1206  TempSrc: Temporal  PainSc:          Complications: No apparent anesthesia complications

## 2019-03-31 NOTE — Anesthesia Preprocedure Evaluation (Signed)
Anesthesia Evaluation  Patient identified by MRN, date of birth, ID band Patient awake    Reviewed: Allergy & Precautions, NPO status , Patient's Chart, lab work & pertinent test results, reviewed documented beta blocker date and time   History of Anesthesia Complications Negative for: history of anesthetic complications  Airway Mallampati: III  TM Distance: >3 FB Neck ROM: Limited    Dental  (+) Teeth Intact, Dental Advidsory Given   Pulmonary neg shortness of breath, sleep apnea and Continuous Positive Airway Pressure Ventilation , COPD, neg recent URI, former smoker,    Pulmonary exam normal        Cardiovascular Exercise Tolerance: Poor hypertension, Pt. on medications and Pt. on home beta blockers + angina with exertion + CAD, + Past MI, + Cardiac Stents, + CABG and +CHF  Normal cardiovascular exam(-) dysrhythmias (-) Valvular Problems/Murmurs     Neuro/Psych PSYCHIATRIC DISORDERS Depression Dementia  Neuromuscular disease    GI/Hepatic hiatal hernia,   Endo/Other  diabetes, Poorly Controlled, Type 2  Renal/GU Renal InsufficiencyRenal disease     Musculoskeletal   Abdominal (+) + obese,   Peds  Hematology   Anesthesia Other Findings Patient saw Dr. Josefa Half 4 months ago and is doing well after stents with an EF of 45-50%  Reproductive/Obstetrics                             Anesthesia Physical  Anesthesia Plan  ASA: III  Anesthesia Plan: General   Post-op Pain Management:    Induction: Intravenous, Rapid sequence and Cricoid pressure planned  PONV Risk Score and Plan: 1 and Ondansetron and Dexamethasone  Airway Management Planned: Oral ETT  Additional Equipment:   Intra-op Plan:   Post-operative Plan: Extubation in OR  Informed Consent: I have reviewed the patients History and Physical, chart, labs and discussed the procedure including the risks, benefits and alternatives  for the proposed anesthesia with the patient or authorized representative who has indicated his/her understanding and acceptance.       Plan Discussed with: CRNA  Anesthesia Plan Comments: (BG 265, CABG, stent CHF, EF35% and schedule for ICD/pacemaker.)        Anesthesia Quick Evaluation

## 2019-03-31 NOTE — Progress Notes (Signed)
Hypoglycemic Event  CBG: 59  Treatment: D50 25 mL (12.5 gm)  Symptoms: None  Follow-up CBG: Time:0036 CBG Result:121  Possible Reasons for Event: Inadequate meal intake  Comments/MD notified: Rufina Falco, NP    Beverly Sessions

## 2019-03-31 NOTE — Interval H&P Note (Signed)
History and Physical Interval Note:  03/31/2019 11:19 AM  Todd Spencer  has presented today for surgery, with the diagnosis of APPENDICITIS.  The various methods of treatment have been discussed with the patient and family. After consideration of risks, benefits and other options for treatment, the patient has consented to  Procedure(s): XI ROBOTIC LAPAROSCOPIC ASSISTED APPENDECTOMY (N/A) as a surgical intervention.  The patient's history has been reviewed, patient examined, no change in status, stable for surgery.  I have reviewed the patient's chart and labs.  Questions were answered to the patient's satisfaction.     Crissy Mccreadie Lysle Pearl

## 2019-03-31 NOTE — Anesthesia Postprocedure Evaluation (Signed)
Anesthesia Post Note  Patient: Todd Spencer  Procedure(s) Performed: XI ROBOTIC LAPAROSCOPIC ASSISTED APPENDECTOMY (N/A ) APPENDECTOMY  Patient location during evaluation: PACU Anesthesia Type: General Level of consciousness: awake and alert and oriented Pain management: pain level controlled Vital Signs Assessment: post-procedure vital signs reviewed and stable Respiratory status: spontaneous breathing, nonlabored ventilation and respiratory function stable Cardiovascular status: blood pressure returned to baseline and stable Postop Assessment: no signs of nausea or vomiting Anesthetic complications: no     Last Vitals:  Vitals:   03/31/19 1206 03/31/19 1442  BP: 114/71 (!) 153/85  Pulse: 89 (!) 101  Resp:  16  Temp: 37.2 C (!) 36.3 C  SpO2: 91% 99%    Last Pain:  Vitals:   03/31/19 1442  TempSrc:   PainSc: Asleep                 Amylah Will

## 2019-03-31 NOTE — Anesthesia Procedure Notes (Signed)
Procedure Name: Intubation Date/Time: 03/31/2019 12:46 PM Performed by: Aline Brochure, CRNA Pre-anesthesia Checklist: Patient identified, Emergency Drugs available, Suction available and Patient being monitored Patient Re-evaluated:Patient Re-evaluated prior to induction Oxygen Delivery Method: Circle system utilized Preoxygenation: Pre-oxygenation with 100% oxygen Induction Type: IV induction, Rapid sequence and Cricoid Pressure applied Laryngoscope Size: McGraph and 4 Grade View: Grade I Tube type: Oral Tube size: 7.5 mm Number of attempts: 1 Airway Equipment and Method: Stylet and Video-laryngoscopy Placement Confirmation: ETT inserted through vocal cords under direct vision,  positive ETCO2 and breath sounds checked- equal and bilateral Secured at: 23 cm Tube secured with: Tape Dental Injury: Teeth and Oropharynx as per pre-operative assessment

## 2019-04-01 LAB — BASIC METABOLIC PANEL
Anion gap: 4 — ABNORMAL LOW (ref 5–15)
BUN: 23 mg/dL (ref 8–23)
CO2: 30 mmol/L (ref 22–32)
Calcium: 8.2 mg/dL — ABNORMAL LOW (ref 8.9–10.3)
Chloride: 104 mmol/L (ref 98–111)
Creatinine, Ser: 1.35 mg/dL — ABNORMAL HIGH (ref 0.61–1.24)
GFR calc Af Amer: >60 mL/min (ref >60)
GFR calc non Af Amer: 53 mL/min — ABNORMAL LOW (ref >60)
Glucose, Bld: 297 mg/dL — ABNORMAL HIGH (ref 70–99)
Potassium: 5.2 mmol/L — ABNORMAL HIGH (ref 3.5–5.1)
Sodium: 138 mmol/L (ref 135–145)

## 2019-04-01 LAB — GLUCOSE, CAPILLARY
Glucose-Capillary: 274 mg/dL — ABNORMAL HIGH (ref 70–99)
Glucose-Capillary: 327 mg/dL — ABNORMAL HIGH (ref 70–99)
Glucose-Capillary: 234 mg/dL — ABNORMAL HIGH (ref 70–99)
Glucose-Capillary: 291 mg/dL — ABNORMAL HIGH (ref 70–99)

## 2019-04-01 LAB — CBC
HCT: 35.3 % — ABNORMAL LOW (ref 39.0–52.0)
Hemoglobin: 11.4 g/dL — ABNORMAL LOW (ref 13.0–17.0)
MCH: 28.5 pg (ref 26.0–34.0)
MCHC: 32.3 g/dL (ref 30.0–36.0)
MCV: 88.3 fL (ref 80.0–100.0)
Platelets: 100 10*3/uL — ABNORMAL LOW (ref 150–400)
RBC: 4 MIL/uL — ABNORMAL LOW (ref 4.22–5.81)
RDW: 14.8 % (ref 11.5–15.5)
WBC: 9.5 10*3/uL (ref 4.0–10.5)
nRBC: 0 % (ref 0.0–0.2)

## 2019-04-01 LAB — PHOSPHORUS: Phosphorus: 2.6 mg/dL (ref 2.5–4.6)

## 2019-04-01 LAB — MAGNESIUM: Magnesium: 1.9 mg/dL (ref 1.7–2.4)

## 2019-04-01 MED ORDER — INSULIN GLARGINE 100 UNIT/ML ~~LOC~~ SOLN
30.0000 [IU] | Freq: Every day | SUBCUTANEOUS | Status: DC
  Administered 2019-04-01 – 2019-04-02 (×2): 30 [IU] via SUBCUTANEOUS
  Filled 2019-04-01 (×3): qty 0.3

## 2019-04-01 NOTE — Progress Notes (Addendum)
Inpatient Diabetes Program Recommendations  AACE/ADA: New Consensus Statement on Inpatient Glycemic Control   Target Ranges:  Prepandial:   less than 140 mg/dL      Peak postprandial:   less than 180 mg/dL (1-2 hours)      Critically ill patients:  140 - 180 mg/dL  Results for Todd Spencer, Todd Spencer (MRN [id number]) as of 04/01/2019 13:34  Ref. Range 04/01/2019 07:35 04/01/2019 12:39  Glucose-Capillary Latest Ref Range: 70 - 99 mg/dL 274 (H)  Novolog 8 units 327 (H)  Novolog 11 units   Results for Todd Spencer, Todd Spencer (MRN [id number]) as of 04/01/2019 10:51  Ref. Range 03/31/2019 08:01 03/31/2019 11:22 03/31/2019 14:13 03/31/2019 15:09 03/31/2019 16:38 03/31/2019 21:00  Glucose-Capillary Latest Ref Range: 70 - 99 mg/dL 116 (H) 123 (H)   Decadron 10 mg 148 (H) 169 (H) 265 (H)  Results for Todd Spencer, Todd Spencer (MRN [id number]) as of 04/01/2019 10:51  Ref. Range 03/30/2019 17:38 03/30/2019 20:26 03/31/2019 00:18 03/31/2019 00:37 03/31/2019 04:16  Glucose-Capillary Latest Ref Range: 70 - 99 mg/dL 70 71 59 (L) 121 (H) 111 (H)   Review of Glycemic Control  Diabetes history: DM2 Outpatient Diabetes medications: Lantus 100 units QHS, Regular 40-50  Units TID with meals, Metformin XR 1500 mg daily, Ozempic 0.25 mg Qweek Current orders for Inpatient glycemic control: Novolog 0-15 units TID with meals  Inpatient Diabetes Program Recommendations:   Insulin-correction: Please consider adding Novolog 0-5 units QHS for bedtime correction.  Insulin-Basal: Please consider ordering Lantus 30 units Q24H.  Consult: May want to consider consulting Hospitalist to assist with DM management while inpatient.  NOTE: In reviewing chart noted patient is followed by Dr. Gabriel Carina (Endocrinologist) and was last seen 03/17/2019 and per office note patient was started on Ozempic 0.25 mg Qweek x4 weeks and then increase to 0.5 mg Qweek (and to continue Lantus, Regular, and Metformin as noted above).  Patient has not received any basal insulin since being  admitted and NO Novolog insulin was given at all on 03/31/19. Noted patient received Decadron 10 mg x1 on 03/31/19 which is contributing to hyperglycemia. Fasting glucose 274 mg/dl today (was 265 mg/dl at bedtime). Recommend adding Novolog 0-5 units QHS for bedtime correction. Will continue to follow and make further recommendations if needed as more data is collected on glycemic trends.  Thanks, Barnie Alderman, RN, MSN, CDE Diabetes Coordinator Inpatient Diabetes Program [phone] (Team Pager from 8am to 5pm)

## 2019-04-01 NOTE — Progress Notes (Signed)
Subjective:  CC: Todd Spencer is a 69 y.o. male  Hospital stay day 2, 1 Day Post-Op open appendectomy  HPI: No issues overnight.  Feeling much better.  Passing flatus as well.  ROS:  General: Denies weight loss, weight gain, fatigue, fevers, chills, and night sweats. Heart: Denies chest pain, palpitations, racing heart, irregular heartbeat, leg pain or swelling, and decreased activity tolerance. Respiratory: Denies breathing difficulty, shortness of breath, wheezing, cough, and sputum. GI: Denies change in appetite, heartburn, nausea, vomiting, constipation, diarrhea, and blood in stool. GU: Denies difficulty urinating, pain with urinating, urgency, frequency, blood in urine.   Objective:   Temp:  [97.3 F (36.3 C)-98.9 F (37.2 C)] 97.6 F (36.4 C) (02/10 0752) Pulse Rate:  [80-104] 80 (02/10 0621) Resp:  [14-22] 18 (02/10 0752) BP: (104-156)/(70-90) 132/71 (02/10 0752) SpO2:  [90 %-99 %] 93 % (02/10 0752)     Height: 5\' 11"  (180.3 cm) Weight: 108.9 kg BMI (Calculated): 33.49   Intake/Output this shift:   Intake/Output Summary (Last 24 hours) at 04/01/2019 0919 Last data filed at 04/01/2019 0809 Gross per 24 hour  Intake 1866.25 ml  Output 1830 ml  Net 36.25 ml    Constitutional :  alert, cooperative, appears stated age and no distress  Respiratory:  clear to auscultation bilaterally  Cardiovascular:  regular rate and rhythm  Gastrointestinal: Soft, no guarding, significant improvement in right lower quadrant tenderness.  AP with minimal serosanguineous fluid..   Skin: Cool and moist.  Midline incision with some strikethrough but no obvious hematoma formation.  Psychiatric: Normal affect, non-agitated, not confused       LABS:  CMP Latest Ref Rng & Units 04/01/2019 03/31/2019 03/30/2019  Glucose 70 - 99 mg/dL 297(H) 131(H) 118(H)  BUN 8 - 23 mg/dL 23 15 16   Creatinine 0.61 - 1.24 mg/dL 1.35(H) 1.17 1.29(H)  Sodium 135 - 145 mmol/L 138 137 139  Potassium 3.5 - 5.1 mmol/L  5.2(H) 4.9 3.9  Chloride 98 - 111 mmol/L 104 102 104  CO2 22 - 32 mmol/L 30 27 26   Calcium 8.9 - 10.3 mg/dL 8.2(L) 8.3(L) 9.0  Total Protein 6.5 - 8.1 g/dL - - 7.0  Total Bilirubin 0.3 - 1.2 mg/dL - - 0.8  Alkaline Phos 38 - 126 U/L - - 57  AST 15 - 41 U/L - - 52(H)  ALT 0 - 44 U/L - - 56(H)   CBC Latest Ref Rng & Units 04/01/2019 03/31/2019 03/30/2019  WBC 4.0 - 10.5 K/uL 9.5 10.9(H) 10.4  Hemoglobin 13.0 - 17.0 g/dL 11.4(L) 12.5(L) 13.2  Hematocrit 39.0 - 52.0 % 35.3(L) 37.8(L) 40.3  Platelets 150 - 400 K/uL 100(L) 103(L) 141(L)    RADS: N/a  Assessment:   Status post open appendectomy for complicated perforated appendicitis with abscess formation.  Continue antibiotics for now, start clear liquid diet and advance diet as tolerated.  Pain well controlled.

## 2019-04-02 LAB — SURGICAL PATHOLOGY

## 2019-04-02 LAB — BASIC METABOLIC PANEL
Anion gap: 9 (ref 5–15)
BUN: 24 mg/dL — ABNORMAL HIGH (ref 8–23)
CO2: 28 mmol/L (ref 22–32)
Calcium: 8.5 mg/dL — ABNORMAL LOW (ref 8.9–10.3)
Chloride: 103 mmol/L (ref 98–111)
Creatinine, Ser: 1.19 mg/dL (ref 0.61–1.24)
GFR calc Af Amer: >60 mL/min (ref >60)
GFR calc non Af Amer: >60 mL/min (ref >60)
Glucose, Bld: 221 mg/dL — ABNORMAL HIGH (ref 70–99)
Potassium: 4.5 mmol/L (ref 3.5–5.1)
Sodium: 140 mmol/L (ref 135–145)

## 2019-04-02 LAB — CBC
HCT: 32.9 % — ABNORMAL LOW (ref 39.0–52.0)
Hemoglobin: 10.8 g/dL — ABNORMAL LOW (ref 13.0–17.0)
MCH: 28.9 pg (ref 26.0–34.0)
MCHC: 32.8 g/dL (ref 30.0–36.0)
MCV: 88 fL (ref 80.0–100.0)
Platelets: 101 10*3/uL — ABNORMAL LOW (ref 150–400)
RBC: 3.74 MIL/uL — ABNORMAL LOW (ref 4.22–5.81)
RDW: 15.1 % (ref 11.5–15.5)
WBC: 7.2 10*3/uL (ref 4.0–10.5)
nRBC: 0 % (ref 0.0–0.2)

## 2019-04-02 LAB — GLUCOSE, CAPILLARY
Glucose-Capillary: 193 mg/dL — ABNORMAL HIGH (ref 70–99)
Glucose-Capillary: 249 mg/dL — ABNORMAL HIGH (ref 70–99)
Glucose-Capillary: 146 mg/dL — ABNORMAL HIGH (ref 70–99)

## 2019-04-02 LAB — PHOSPHORUS: Phosphorus: 2.3 mg/dL — ABNORMAL LOW (ref 2.5–4.6)

## 2019-04-02 LAB — MAGNESIUM: Magnesium: 2.3 mg/dL (ref 1.7–2.4)

## 2019-04-02 MED ORDER — HYDROCODONE-ACETAMINOPHEN 5-325 MG PO TABS: 1.0000 | ORAL_TABLET | Freq: Four times a day (QID) | ORAL | 0 refills | Status: DC | PRN

## 2019-04-02 MED ORDER — INSULIN GLARGINE 100 UNIT/ML ~~LOC~~ SOLN: 100.0000 [IU] | Freq: Every day | SUBCUTANEOUS | 0 refills | Status: DC

## 2019-04-02 MED ORDER — ACETAMINOPHEN 325 MG PO TABS
650.0000 mg | ORAL_TABLET | Freq: Four times a day (QID) | ORAL | Status: DC | PRN
  Administered 2019-04-02: 13:00:00 650 mg via ORAL
  Filled 2019-04-02: qty 2

## 2019-04-02 MED ORDER — DOCUSATE SODIUM 100 MG PO CAPS: 100.0000 mg | ORAL_CAPSULE | Freq: Two times a day (BID) | ORAL | 0 refills | Status: AC | PRN

## 2019-04-02 MED ORDER — METRONIDAZOLE 500 MG PO TABS: 500.0000 mg | ORAL_TABLET | Freq: Three times a day (TID) | ORAL | 0 refills | Status: AC

## 2019-04-02 MED ORDER — ACETAMINOPHEN 325 MG PO TABS: 650.0000 mg | ORAL_TABLET | Freq: Three times a day (TID) | ORAL | 0 refills | Status: AC | PRN

## 2019-04-02 MED ORDER — CIPROFLOXACIN HCL 500 MG PO TABS
500.0000 mg | ORAL_TABLET | Freq: Two times a day (BID) | ORAL | Status: DC
  Administered 2019-04-02: 500 mg via ORAL
  Filled 2019-04-02 (×2): qty 1

## 2019-04-02 MED ORDER — IBUPROFEN 800 MG PO TABS: 800.0000 mg | ORAL_TABLET | Freq: Three times a day (TID) | ORAL | 0 refills | Status: DC | PRN

## 2019-04-02 MED ORDER — TRAMADOL HCL 50 MG PO TABS
50.0000 mg | ORAL_TABLET | Freq: Four times a day (QID) | ORAL | Status: DC | PRN
  Administered 2019-04-02: 16:00:00 50 mg via ORAL
  Filled 2019-04-02: qty 1

## 2019-04-02 MED ORDER — METRONIDAZOLE 500 MG PO TABS
500.0000 mg | ORAL_TABLET | Freq: Three times a day (TID) | ORAL | Status: DC
  Filled 2019-04-02 (×2): qty 1

## 2019-04-02 MED ORDER — CIPROFLOXACIN HCL 500 MG PO TABS: 500.0000 mg | ORAL_TABLET | Freq: Two times a day (BID) | ORAL | 0 refills | Status: AC

## 2019-04-02 MED ORDER — IBUPROFEN 400 MG PO TABS: 600.0000 mg | ORAL_TABLET | Freq: Three times a day (TID) | ORAL | Status: DC | PRN

## 2019-04-02 NOTE — Discharge Instructions (Signed)
Laparoscopic Appendectomy, Care After This sheet gives you information about how to care for yourself after your procedure. Your doctor may also give you more specific instructions. If you have problems or questions, contact your doctor. Follow these instructions at home: Care for cuts from surgery (incisions)   Follow instructions from your doctor about how to take care of your cuts from surgery. Make sure you: ? Wash your hands with soap and water before you change your bandage (dressing). If you cannot use soap and water, use hand sanitizer. ? Change your bandage as told by your doctor. ? Leave stitches (sutures), skin glue, or skin tape (adhesive) strips in place. They may need to stay in place for 2 weeks or longer. If tape strips get loose and curl up, you may trim the loose edges. Do not remove tape strips completely unless your doctor says it is okay.  Do not take baths, swim, or use a hot tub until your doctor says it is okay. OK TO SHOWER 24HRS AFTER YOUR SURGERY.   Check your surgical cut area every day for signs of infection. Check for: ? More redness, swelling, or pain. ? More fluid or blood. ? Warmth. ? Pus or a bad smell. Activity  Do not drive or use heavy machinery while taking prescription pain medicine.  Do not play contact sports until your doctor says it is okay.  Do not drive for 24 hours if you were given a medicine to help you relax (sedative).  Rest as needed. Do not return to work or school until your doctor says it is okay. General instructions .  tylenol and advil as needed for discomfort.  Please alternate between the two every four hours as needed for pain.   .  Use narcotics, if prescribed, only when tylenol and motrin is not enough to control pain. .  325-650mg  every 8hrs to max of 3000mg /24hrs (including the 325mg  in every norco dose) for the tylenol.   .  Advil up to 800mg  per dose every 8hrs as needed for pain.   . Measure daily output from drain as  instructed  To prevent or treat constipation while you are taking prescription pain medicine, your doctor may recommend that you: ? Drink enough fluid to keep your pee (urine) clear or pale yellow. ? Take over-the-counter or prescription medicines. ? Eat foods that are high in fiber, such as fresh fruits and vegetables, whole grains, and beans. ? Limit foods that are high in fat and processed sugars, such as fried and sweet foods. Contact a doctor if:  You develop a rash.  You have more redness, swelling, or pain around your surgical cuts.  You have more fluid or blood coming from your surgical cuts.  Your surgical cuts feel warm to the touch.  You have pus or a bad smell coming from your surgical cuts.  You have a fever.  One or more of your surgical cuts breaks open. Get help right away if:  You have trouble breathing.  You have chest pain.  You have pain that is getting worse in your shoulders.  You faint or feel dizzy when you stand.  You have very bad pain in your belly (abdomen).  You are sick to your stomach (nauseous) for more than one day.  You have throwing up (vomiting) that lasts for more than one day.  You have leg pain. This information is not intended to replace advice given to you by your health care provider. Make sure  you discuss any questions you have with your health care provider. Document Released: 11/15/2007 Document Revised: 08/27/2015 Document Reviewed: 07/25/2015 Elsevier Interactive Patient Education  2019 [employer].

## 2019-04-02 NOTE — Progress Notes (Signed)
Inpatient Diabetes Program Recommendations  AACE/ADA: New Consensus Statement on Inpatient Glycemic Control   Target Ranges:  Prepandial:   less than 140 mg/dL      Peak postprandial:   less than 180 mg/dL (1-2 hours)      Critically ill patients:  140 - 180 mg/dL   Results for Todd Spencer, Todd Spencer (MRN [id number]) as of 04/02/2019 07:30  Ref. Range 04/02/2019 05:33  Glucose Latest Ref Range: 70 - 99 mg/dL 221 (H)   Results for Todd Spencer, Todd Spencer (MRN [id number]) as of 04/02/2019 07:30  Ref. Range 04/01/2019 07:35 04/01/2019 12:39 04/01/2019 16:38 04/01/2019 20:59  Glucose-Capillary Latest Ref Range: 70 - 99 mg/dL 274 (H) 327 (H) 234 (H) 291 (H)   Review of Glycemic Control  Diabetes history: DM2 Outpatient Diabetes medications: Lantus 100 units QHS, Regular 40-50  Units TID with meals, Metformin XR 1500 mg daily, Ozempic 0.25 mg Qweek Current orders for Inpatient glycemic control:Lantus 30 units daily, Novolog 0-15 units TID with meals   NOTE: In reviewing chart noted patient is followed by Dr. Gabriel Carina (Endocrinologist) and was last seen 03/17/2019 and per office note patient was started on Ozempic 0.25 mg Qweek x4 weeks and then increase to 0.5 mg Qweek (and to continue Lantus, Regular, and Metformin as noted above).  Patient has not received any basal insulin since being admitted and NO Novolog insulin was given at all on 03/31/19. Noted patient received Decadron 10 mg x1 on 03/31/19 which is contributing to hyperglycemia. Noted patient received Lantus 30 units around 16:14 on 04/01/19 and is scheduled to receive Lantus 30 units this morning. No recommendations for changes at this time.   Thanks, Barnie Alderman, RN, MSN, CDE Diabetes Coordinator Inpatient Diabetes Program [phone] (Team Pager from 8am to 5pm)

## 2019-04-02 NOTE — Op Note (Signed)
Preoperative diagnosis: Acute appendicitis.  Postoperative diagnosis: Acute appendicitis with abscess formation  Procedure: Robotic assisted laparoscopic converted to open appendectomy.  Anesthesia: GETA  Surgeon: Benjamine Sprague  Wound Classification: clean contaminated  Specimen: Appendix  Complications: None  Estimated Blood Loss: 3 mL   Indications: Patient is a 69 y.o. male  presented with right lower quadrant pain.  Computed tomography scan and physical examination were consistent with acute appendicitis.  24-hour course of antibiotics was completed prior to attempting appendectomy due to patient's use of full dose aspirin.  Findings: 1. Acutely inflamed appendix with abscess 2. Normal anatomy 3. Adequate hemostasis.   Description of procedure: The patient was placed on the operating table in the supine position, left arm tucked. General anesthesia was induced. A time-out was completed verifying correct patient, procedure, site, positioning, and implant(s) and/or special equipment prior to beginning this procedure. A Foley catheter placed. The abdomen was prepped and draped in the usual sterile fashion.   Palmer's point chosen for Veress needle insertion.  Abdomen was insufflated with no dramatic increase in pressure.  After reaching 15 mmHg, needle removed and abdomen entered via the Optiview technique.  Upon entering abdominal cavity.  Multiple dense adhesions were noted surrounding the entire area of port insertion site.  No injury was noted to the surrounding structures including loops of bowel.  Due to the inability to visualize any further beyond these extensive adhesions, decision was made to proceed with an open appendectomy.  Infraumbilical incision was made extended down to the fascia.  After incising the fascia the previous ventral hernia repair mesh was noted.  This had to be incised in order to obtain adequate exposure to the area of concern.  Afterwards inspection of the  right lower quadrant noted a severely inflamed appendix with surrounding phlegmon and abscess, with purulent fluid noted.  Area was suctioned out extensively and the appendiceal tip was eventually visualized with gentle blunt dissection.  The appendix itself was then traced down to the cecal base which was noted to be intact.  A laparoscopic Endo GIA stapler with a blue load was then used to transect the base.  Afterwards, surrounding inflammatory tissue was carefully dissected off the appendix and the mesoappendix was ligated completely until the appendiceal specimen was able to be removed off operative field.  Reinspection of the area of concern noted adequate hemostasis and no other further sign of spillage or purulent fluid.  There is no other areas of a potential abscess.  JP drain placed through the right lower quadrant with drain located over the healthy cecal base staple line.  Cavity extensively irrigated, Exparel infused along incision line prior to initiating closure of the fascia using 1 PDS x2.  During closure the mesh itself was also approximated.  Subcutaneous layer then closed with interrupted 3-0 Vicryl prior to closing skin with interrupted staples.  Wound then dressed with honeycomb dressing.  Drain secured to skin using 3-0 nylon and secured with drain dressing.  The patient tolerated the procedure well, foley removed, awakened from anesthesia and was taken to the postanesthesia care unit in satisfactory condition.  Sponge count and instrument count correct at the end of the procedure.

## 2019-04-02 NOTE — Care Management Important Message (Signed)
Important Message  Patient Details  Name: Todd Spencer MRN: [id number] Date of Birth: 1950-10-22   Medicare Important Message Given:  Yes  Initial Medicare IM given by Patient Access Associate on 04/01/2019 at 9:10am.  Still valid.   Dannette Barbara 04/02/2019, 1:22 PM

## 2019-04-02 NOTE — Discharge Summary (Signed)
Physician Discharge Summary  Patient ID: ASCHER BERTRAND MRN: [id number] DOB/AGE: 04-21-1950 69 y.o.  Admit date: 03/30/2019 Discharge date: 04/02/2019  Admission Diagnoses: Acute appendicitis  Discharge Diagnoses:  Same as above  Discharged Condition: good  Hospital Course: Admitted for above.  24-hour antibiotic course to allow the full dose aspirin if asked to wear off slightly prior to proceeding with open appendectomy secondary to patient's extensive abdominal history.  Please see op note for details.  Postop patient recovered without any issues.  Diet slowly advanced and at time of discharge pain was minimal, tolerating regular diet, JP with minimal serosanguineous drainage.  Also having bowel movements.  Patient will follow-up in office for possible staple removal and drain removal in 1 week. Also will finish abx course outpt.  Consults: None  Discharge Exam: Blood pressure 125/75, pulse 60, temperature 97.9 F (36.6 C), resp. rate 18, height 5\' 11"  (1.803 m), weight 108.9 kg, SpO2 96 %. General appearance: alert, cooperative and no distress GI: Soft, no guarding, minimal tenderness to palpation in right lower quadrant as expected.Staple intact.  JP with minimal serosanguineous drainage.  Disposition:  Discharge disposition: 01-Home or Self Care       Discharge Instructions    Discharge patient   Complete by: As directed    Discharge disposition: 01-Home or Self Care   Discharge patient date: 04/02/2019     Allergies as of 04/02/2019   No Known Allergies     Medication List    TAKE these medications   acetaminophen 325 MG tablet Commonly known as: Tylenol Take 2 tablets (650 mg total) by mouth every 8 (eight) hours as needed for mild pain.   aspirin 325 MG EC tablet Take 1 tablet (325 mg total) by mouth daily with breakfast. Notes to patient: RESUME ON SAT 2/13   atorvastatin 40 MG tablet Commonly known as: LIPITOR Take 40 mg by mouth daily.   carvedilol  3.125 MG tablet Commonly known as: COREG Take 3.125 mg by mouth 2 (two) times daily with a meal.   ciprofloxacin 500 MG tablet Commonly known as: CIPRO Take 1 tablet (500 mg total) by mouth 2 (two) times daily for 7 days.   diazepam 5 MG tablet Commonly known as: VALIUM Take 5 mg by mouth every 12 (twelve) hours as needed for anxiety.   docusate sodium 100 MG capsule Commonly known as: Colace Take 1 capsule (100 mg total) by mouth 2 (two) times daily as needed for up to 10 days for mild constipation.   donepezil 10 MG tablet Commonly known as: ARICEPT Take 10 mg by mouth daily.   doxepin 100 MG capsule Commonly known as: SINEQUAN Take 100-200 mg by mouth See admin instructions. Take 100 mg in the morning and 200 mg at night   famotidine 20 MG tablet Commonly known as: PEPCID Take 20 mg by mouth 2 (two) times daily.   gemfibrozil 600 MG tablet Commonly known as: LOPID Take 600 mg by mouth 2 (two) times daily before a meal.   HYDROcodone-acetaminophen 5-325 MG tablet Commonly known as: Norco Take 1 tablet by mouth every 6 (six) hours as needed for up to 6 doses for moderate pain.   ibuprofen 800 MG tablet Commonly known as: ADVIL Take 1 tablet (800 mg total) by mouth every 8 (eight) hours as needed for mild pain or moderate pain.   insulin glargine 100 UNIT/ML injection Commonly known as: LANTUS Inject 1 mL (100 Units total) into the skin at bedtime.  insulin regular 100 units/mL injection Commonly known as: NOVOLIN R Inject 40 Units into the skin 3 (three) times daily.   isosorbide dinitrate 30 MG tablet Commonly known as: ISORDIL Take 30 mg by mouth 3 (three) times daily.   magnesium hydroxide 400 MG/5ML suspension Commonly known as: MILK OF MAGNESIA Take 15 mLs by mouth at bedtime.   meclizine 25 MG tablet Commonly known as: ANTIVERT Take 25 mg by mouth 2 (two) times daily.   metFORMIN 500 MG 24 hr tablet Commonly known as: GLUCOPHAGE-XR Take 1,500 mg  by mouth daily.   metroNIDAZOLE 500 MG tablet Commonly known as: FLAGYL Take 1 tablet (500 mg total) by mouth every 8 (eight) hours for 7 days.   multivitamin with minerals Tabs tablet Take 1 tablet by mouth daily.   nitroGLYCERIN 0.4 MG SL tablet Commonly known as: NITROSTAT Place 0.4 mg under the tongue every 5 (five) minutes as needed for chest pain.   pregabalin 300 MG capsule Commonly known as: LYRICA Take 300 mg by mouth 2 (two) times daily.      Follow-up Information    Ekin Pilar, DO Follow up in 1 week(s).   Specialty: Surgery Why: post op appy possible staple and drain removal Contact information: [address] [place] [address] [phone]            Total time spent arranging discharge was >10min. Signed: Benjamine Sprague 04/02/2019, 12:53 PM

## 2019-04-03 IMAGING — CT CT ANGIO CHEST
2 of 6 series · 18 of 46 positions shown · IV contrast (APPLIED)
Comparison: Radiograph October 16, 2017. CT scan October 16, 2016.

CLINICAL DATA: Respiratory distress.

EXAM:
CT ANGIOGRAPHY CHEST WITH CONTRAST
TECHNIQUE: Multidetector CT imaging of the chest was performed using the
standard protocol during bolus administration of intravenous
contrast. Multiplanar CT image reconstructions and MIPs were
obtained to evaluate the vascular anatomy.
CONTRAST:  80mL 129RCB-1RG IOPAMIDOL (129RCB-1RG) INJECTION 76%

[Series 5: thins · axial · 0.77mm/px · z∈[-988,-731]mm · 15 of 283 slices shown]
[im 13/283  lung]
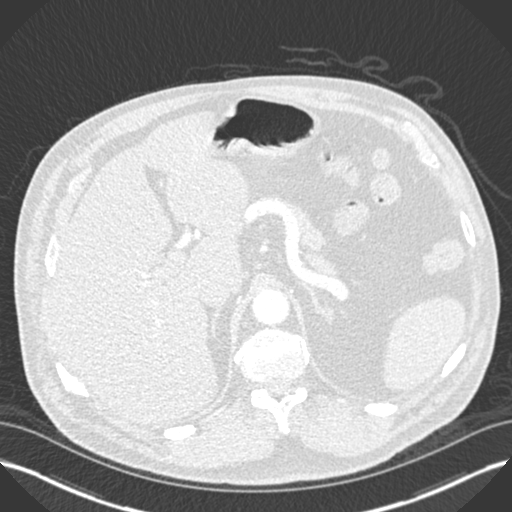
[im 37/283  soft-tissue]
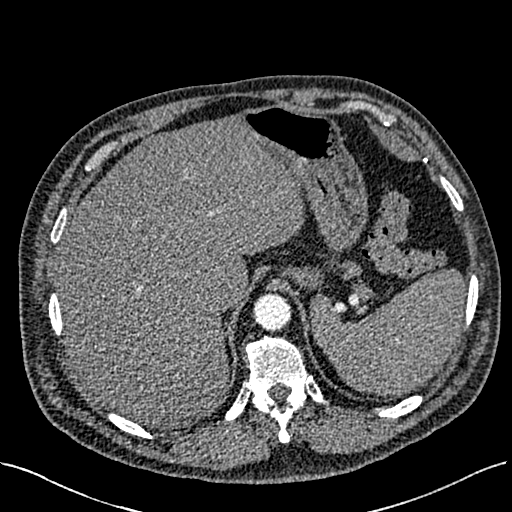
[im 50/283  lung]
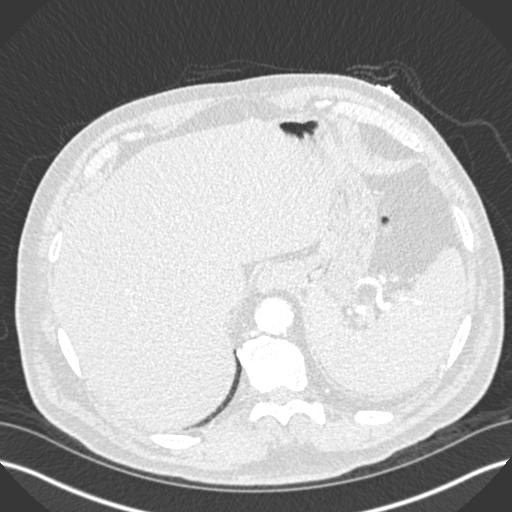
[im 74/283  soft-tissue]
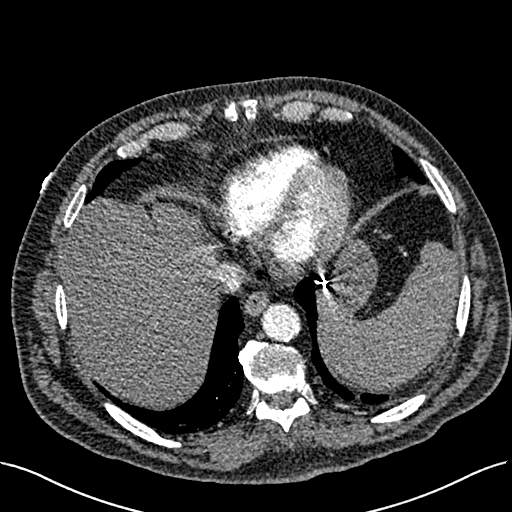
[im 86/283  lung]
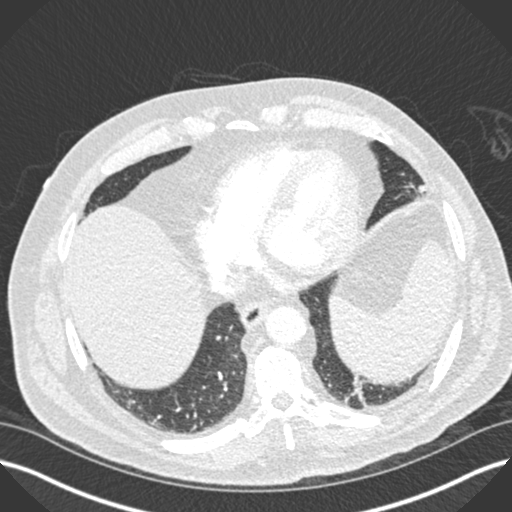
[im 111/283  soft-tissue]
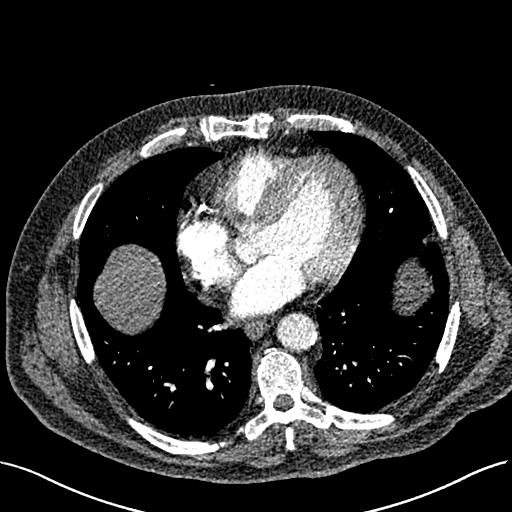
[im 123/283  lung]
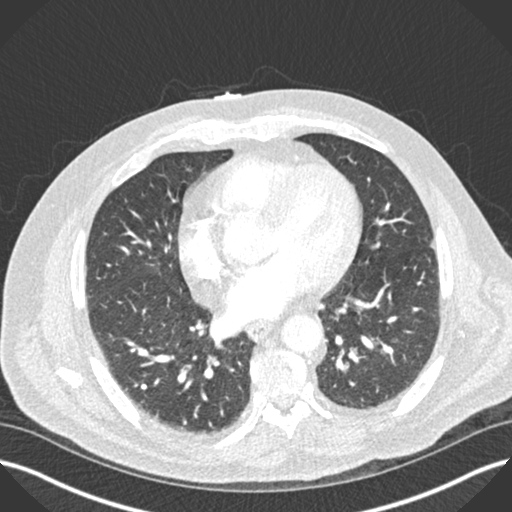
[im 148/283  soft-tissue]
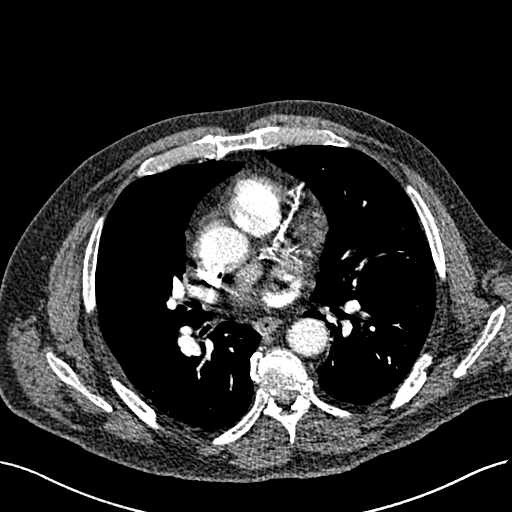
[im 160/283  lung]
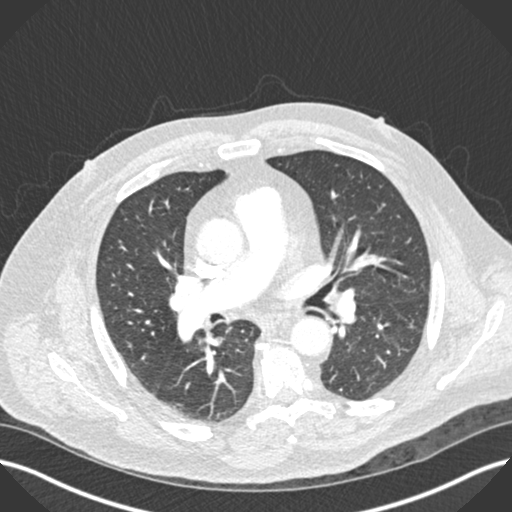
[im 172/283  soft-tissue]
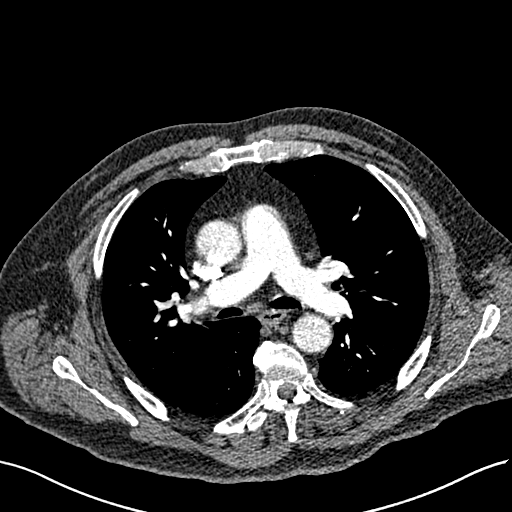
[im 197/283  lung]
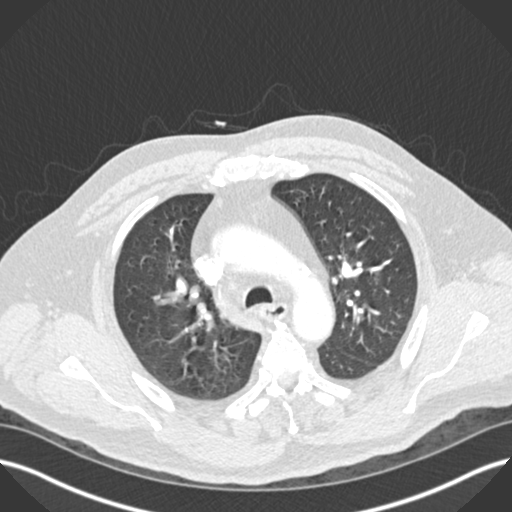
[im 209/283  soft-tissue]
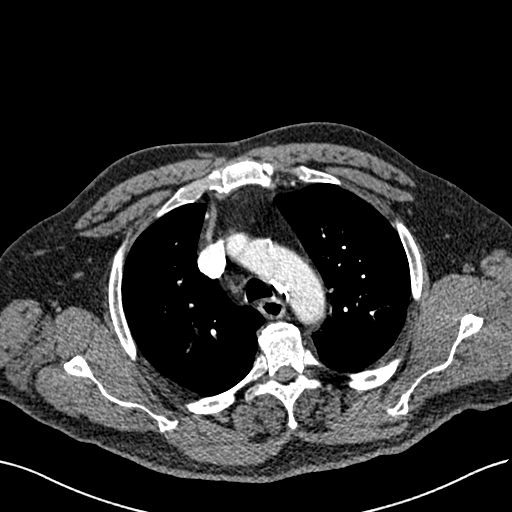
[im 233/283  lung]
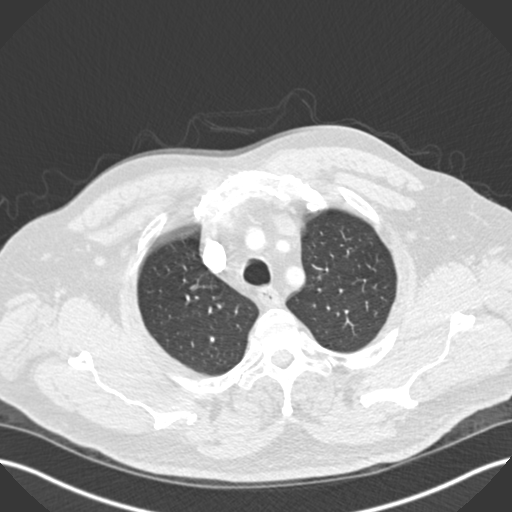
[im 246/283  soft-tissue]
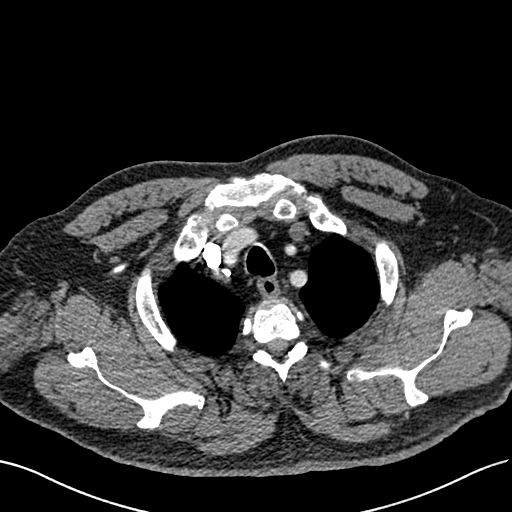
[im 270/283  lung]
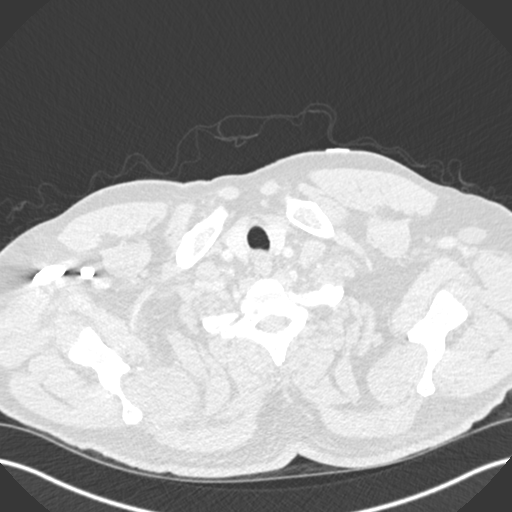

[Series 7: coronal mpr · coronal · 0.56mm/px · 3 of 90 slices shown]
[im 23/90  soft-tissue]
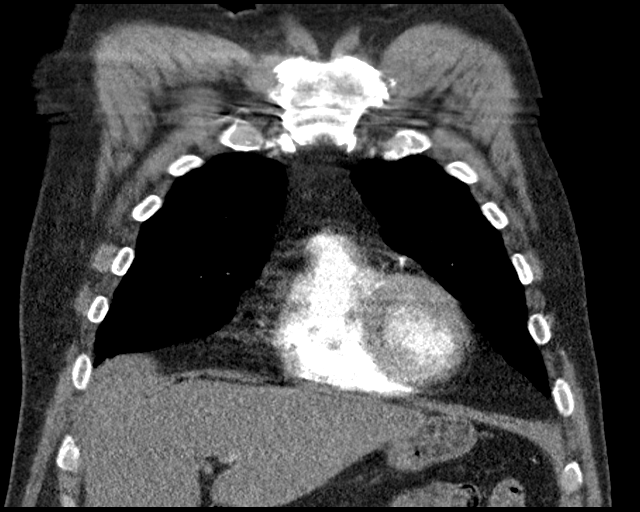
[im 45/90  soft-tissue]
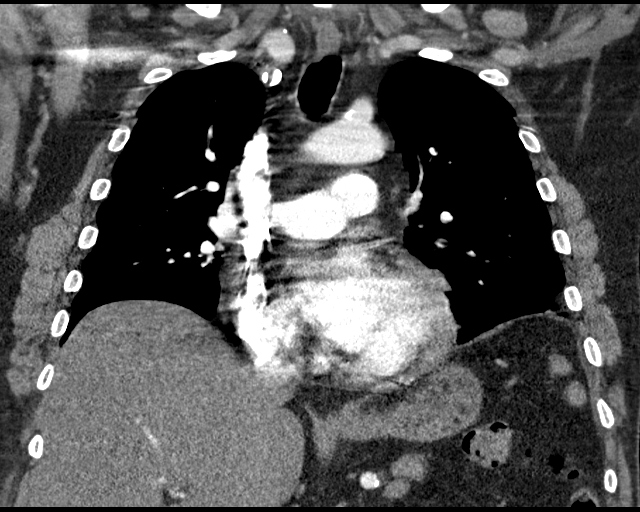
[im 67/90  soft-tissue]
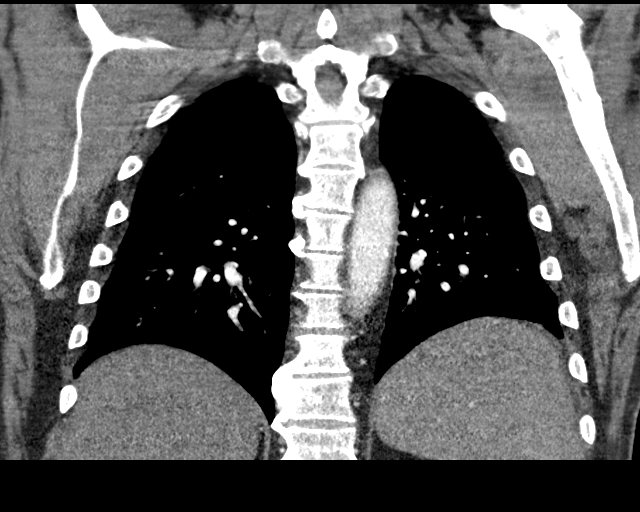

[18 of 46 positions shown; findings below may reference images not displayed]

FINDINGS: Cardiovascular: Satisfactory opacification of the pulmonary arteries
to the segmental level. No evidence of pulmonary embolism. Normal
heart size. No pericardial effusion. Atherosclerosis of thoracic
aorta is noted without aneurysm or dissection. Coronary
calcifications are noted.

Mediastinum/Nodes: No enlarged mediastinal, hilar, or axillary lymph
nodes. Thyroid gland, trachea, and esophagus demonstrate no
significant findings.

Lungs/Pleura: Lungs are clear. No pleural effusion or pneumothorax.

Upper Abdomen: No acute abnormality.

Musculoskeletal: No chest wall abnormality. No acute or significant
osseous findings.

Review of the MIP images confirms the above findings.
IMPRESSION: No definite evidence of pulmonary embolus.

Coronary artery calcifications are noted suggesting coronary artery
disease.

No acute abnormality seen in the chest.

Aortic Atherosclerosis ([id number]-B28.8).

## 2019-04-08 DIAGNOSIS — R6889 Other general symptoms and signs: Secondary | ICD-10-CM | POA: Diagnosis not present

## 2019-04-12 ENCOUNTER — Encounter: Payer: Self-pay | Admitting: *Deleted

## 2019-05-04 DIAGNOSIS — H34831 Tributary (branch) retinal vein occlusion, right eye, with macular edema: Secondary | ICD-10-CM | POA: Diagnosis not present

## 2019-05-11 DIAGNOSIS — I110 Hypertensive heart disease with heart failure: Secondary | ICD-10-CM | POA: Diagnosis not present

## 2019-05-11 DIAGNOSIS — E782 Mixed hyperlipidemia: Secondary | ICD-10-CM | POA: Diagnosis not present

## 2019-05-11 DIAGNOSIS — E1165 Type 2 diabetes mellitus with hyperglycemia: Secondary | ICD-10-CM | POA: Diagnosis not present

## 2019-05-11 DIAGNOSIS — E1149 Type 2 diabetes mellitus with other diabetic neurological complication: Secondary | ICD-10-CM | POA: Diagnosis not present

## 2019-05-11 DIAGNOSIS — I2510 Atherosclerotic heart disease of native coronary artery without angina pectoris: Secondary | ICD-10-CM | POA: Diagnosis not present

## 2019-05-11 DIAGNOSIS — R413 Other amnesia: Secondary | ICD-10-CM | POA: Diagnosis not present

## 2019-05-11 DIAGNOSIS — Z794 Long term (current) use of insulin: Secondary | ICD-10-CM | POA: Diagnosis not present

## 2019-05-11 DIAGNOSIS — Z0000 Encounter for general adult medical examination without abnormal findings: Secondary | ICD-10-CM | POA: Diagnosis not present

## 2019-05-11 DIAGNOSIS — I5022 Chronic systolic (congestive) heart failure: Secondary | ICD-10-CM | POA: Diagnosis not present

## 2019-06-09 DIAGNOSIS — Z125 Encounter for screening for malignant neoplasm of prostate: Secondary | ICD-10-CM | POA: Diagnosis not present

## 2019-06-09 DIAGNOSIS — E782 Mixed hyperlipidemia: Secondary | ICD-10-CM | POA: Diagnosis not present

## 2019-06-09 DIAGNOSIS — E1159 Type 2 diabetes mellitus with other circulatory complications: Secondary | ICD-10-CM | POA: Diagnosis not present

## 2019-06-09 DIAGNOSIS — I10 Essential (primary) hypertension: Secondary | ICD-10-CM | POA: Diagnosis not present

## 2019-06-15 DIAGNOSIS — H34831 Tributary (branch) retinal vein occlusion, right eye, with macular edema: Secondary | ICD-10-CM | POA: Diagnosis not present

## 2019-06-16 DIAGNOSIS — N1831 Chronic kidney disease, stage 3a: Secondary | ICD-10-CM | POA: Diagnosis not present

## 2019-06-16 DIAGNOSIS — E1159 Type 2 diabetes mellitus with other circulatory complications: Secondary | ICD-10-CM | POA: Diagnosis not present

## 2019-06-16 DIAGNOSIS — E113293 Type 2 diabetes mellitus with mild nonproliferative diabetic retinopathy without macular edema, bilateral: Secondary | ICD-10-CM | POA: Diagnosis not present

## 2019-06-16 DIAGNOSIS — E1142 Type 2 diabetes mellitus with diabetic polyneuropathy: Secondary | ICD-10-CM | POA: Diagnosis not present

## 2019-06-16 DIAGNOSIS — E1121 Type 2 diabetes mellitus with diabetic nephropathy: Secondary | ICD-10-CM | POA: Diagnosis not present

## 2019-06-16 DIAGNOSIS — Z794 Long term (current) use of insulin: Secondary | ICD-10-CM | POA: Diagnosis not present

## 2019-06-16 DIAGNOSIS — E1165 Type 2 diabetes mellitus with hyperglycemia: Secondary | ICD-10-CM | POA: Diagnosis not present

## 2019-06-16 DIAGNOSIS — E782 Mixed hyperlipidemia: Secondary | ICD-10-CM | POA: Diagnosis not present

## 2019-06-17 ENCOUNTER — Emergency Department: Payer: Medicare HMO

## 2019-06-17 ENCOUNTER — Other Ambulatory Visit: Payer: Self-pay

## 2019-06-17 ENCOUNTER — Encounter: Payer: Self-pay | Admitting: Emergency Medicine

## 2019-06-17 DIAGNOSIS — Z79899 Other long term (current) drug therapy: Secondary | ICD-10-CM | POA: Insufficient documentation

## 2019-06-17 DIAGNOSIS — Z96652 Presence of left artificial knee joint: Secondary | ICD-10-CM | POA: Diagnosis not present

## 2019-06-17 DIAGNOSIS — I252 Old myocardial infarction: Secondary | ICD-10-CM | POA: Diagnosis not present

## 2019-06-17 DIAGNOSIS — I2510 Atherosclerotic heart disease of native coronary artery without angina pectoris: Secondary | ICD-10-CM | POA: Diagnosis not present

## 2019-06-17 DIAGNOSIS — Z794 Long term (current) use of insulin: Secondary | ICD-10-CM | POA: Diagnosis not present

## 2019-06-17 DIAGNOSIS — E1122 Type 2 diabetes mellitus with diabetic chronic kidney disease: Secondary | ICD-10-CM | POA: Diagnosis not present

## 2019-06-17 DIAGNOSIS — R0789 Other chest pain: Secondary | ICD-10-CM | POA: Diagnosis not present

## 2019-06-17 DIAGNOSIS — R079 Chest pain, unspecified: Secondary | ICD-10-CM | POA: Diagnosis not present

## 2019-06-17 DIAGNOSIS — Z7982 Long term (current) use of aspirin: Secondary | ICD-10-CM | POA: Diagnosis not present

## 2019-06-17 DIAGNOSIS — I5022 Chronic systolic (congestive) heart failure: Secondary | ICD-10-CM | POA: Insufficient documentation

## 2019-06-17 DIAGNOSIS — Z9861 Coronary angioplasty status: Secondary | ICD-10-CM | POA: Diagnosis not present

## 2019-06-17 DIAGNOSIS — Z87891 Personal history of nicotine dependence: Secondary | ICD-10-CM | POA: Diagnosis not present

## 2019-06-17 DIAGNOSIS — G308 Other Alzheimer's disease: Secondary | ICD-10-CM | POA: Diagnosis not present

## 2019-06-17 DIAGNOSIS — Z9049 Acquired absence of other specified parts of digestive tract: Secondary | ICD-10-CM | POA: Insufficient documentation

## 2019-06-17 DIAGNOSIS — I130 Hypertensive heart and chronic kidney disease with heart failure and stage 1 through stage 4 chronic kidney disease, or unspecified chronic kidney disease: Secondary | ICD-10-CM | POA: Diagnosis not present

## 2019-06-17 DIAGNOSIS — T50905A Adverse effect of unspecified drugs, medicaments and biological substances, initial encounter: Secondary | ICD-10-CM | POA: Insufficient documentation

## 2019-06-17 DIAGNOSIS — N183 Chronic kidney disease, stage 3 unspecified: Secondary | ICD-10-CM | POA: Insufficient documentation

## 2019-06-17 DIAGNOSIS — F0150 Vascular dementia without behavioral disturbance: Secondary | ICD-10-CM | POA: Insufficient documentation

## 2019-06-17 DIAGNOSIS — R222 Localized swelling, mass and lump, trunk: Secondary | ICD-10-CM | POA: Diagnosis present

## 2019-06-17 LAB — GLUCOSE, CAPILLARY: Glucose-Capillary: 182 mg/dL — ABNORMAL HIGH (ref 70–99)

## 2019-06-17 NOTE — ED Triage Notes (Signed)
Patient ambulatory to triage with steady gait, without difficulty or distress noted; pt reports pressure to left side chest and left arm, sudden onset PTA; st hx MI; st began trulicity today and believes he is having a rx

## 2019-06-18 ENCOUNTER — Emergency Department
Admission: EM | Admit: 2019-06-18 | Discharge: 2019-06-18 | Disposition: A | Discharge status: Home / Self Care | Payer: Medicare HMO | Attending: Emergency Medicine | Admitting: Emergency Medicine

## 2019-06-18 DIAGNOSIS — R079 Chest pain, unspecified: Secondary | ICD-10-CM

## 2019-06-18 DIAGNOSIS — R0789 Other chest pain: Secondary | ICD-10-CM | POA: Diagnosis not present

## 2019-06-18 DIAGNOSIS — T50905A Adverse effect of unspecified drugs, medicaments and biological substances, initial encounter: Secondary | ICD-10-CM

## 2019-06-18 LAB — BRAIN NATRIURETIC PEPTIDE: B Natriuretic Peptide: 21 pg/mL (ref 0.0–100.0)

## 2019-06-18 LAB — CBC WITH DIFFERENTIAL/PLATELET
Abs Immature Granulocytes: 0.03 10*3/uL (ref 0.00–0.07)
Basophils Absolute: 0.1 10*3/uL (ref 0.0–0.1)
Basophils Relative: 1 %
Eosinophils Absolute: 0.3 10*3/uL (ref 0.0–0.5)
Eosinophils Relative: 5 %
HCT: 39.3 % (ref 39.0–52.0)
Hemoglobin: 13.6 g/dL (ref 13.0–17.0)
Immature Granulocytes: 1 %
Lymphocytes Relative: 24 %
Lymphs Abs: 1.6 10*3/uL (ref 0.7–4.0)
MCH: 29.2 pg (ref 26.0–34.0)
MCHC: 34.6 g/dL (ref 30.0–36.0)
MCV: 84.5 fL (ref 80.0–100.0)
Monocytes Absolute: 1 10*3/uL (ref 0.1–1.0)
Monocytes Relative: 15 %
Neutro Abs: 3.7 10*3/uL (ref 1.7–7.7)
Neutrophils Relative %: 54 %
Platelets: 128 10*3/uL — ABNORMAL LOW (ref 150–400)
RBC: 4.65 MIL/uL (ref 4.22–5.81)
RDW: 15.9 % — ABNORMAL HIGH (ref 11.5–15.5)
WBC: 6.6 10*3/uL (ref 4.0–10.5)
nRBC: 0 % (ref 0.0–0.2)

## 2019-06-18 LAB — TROPONIN I (HIGH SENSITIVITY)
Troponin I (High Sensitivity): 18 ng/L — ABNORMAL HIGH (ref <18)
Troponin I (High Sensitivity): 18 ng/L — ABNORMAL HIGH (ref <18)

## 2019-06-18 LAB — COMPREHENSIVE METABOLIC PANEL
ALT: 53 U/L — ABNORMAL HIGH (ref 0–44)
AST: 47 U/L — ABNORMAL HIGH (ref 15–41)
Albumin: 4.2 g/dL (ref 3.5–5.0)
Alkaline Phosphatase: 60 U/L (ref 38–126)
Anion gap: 11 (ref 5–15)
BUN: 32 mg/dL — ABNORMAL HIGH (ref 8–23)
CO2: 27 mmol/L (ref 22–32)
Calcium: 10 mg/dL (ref 8.9–10.3)
Chloride: 102 mmol/L (ref 98–111)
Creatinine, Ser: 1.28 mg/dL — ABNORMAL HIGH (ref 0.61–1.24)
GFR calc Af Amer: >60 mL/min (ref >60)
GFR calc non Af Amer: 56 mL/min — ABNORMAL LOW (ref >60)
Glucose, Bld: 190 mg/dL — ABNORMAL HIGH (ref 70–99)
Potassium: 4 mmol/L (ref 3.5–5.1)
Sodium: 140 mmol/L (ref 135–145)
Total Bilirubin: 0.7 mg/dL (ref 0.3–1.2)
Total Protein: 7.6 g/dL (ref 6.5–8.1)

## 2019-06-18 LAB — CK: Total CK: 317 U/L (ref 49–397)

## 2019-06-18 LAB — LIPASE, BLOOD: Lipase: 48 U/L (ref 11–51)

## 2019-06-18 MED ORDER — SODIUM CHLORIDE 0.9 % IV BOLUS
500.0000 mL | Freq: Once | INTRAVENOUS | Status: AC
  Administered 2019-06-18: 500 mL via INTRAVENOUS

## 2019-06-18 MED ORDER — DIPHENHYDRAMINE HCL 25 MG PO CAPS
50.0000 mg | ORAL_CAPSULE | Freq: Once | ORAL | Status: AC
  Administered 2019-06-18: 50 mg via ORAL
  Filled 2019-06-18: qty 2

## 2019-06-18 NOTE — Discharge Instructions (Addendum)
Do not take any more Trulicity until you speak to your doctor.  Return to the ER for worsening symptoms, persistent vomiting, difficulty breathing or other concerns.
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
## 2019-07-16 DIAGNOSIS — I10 Essential (primary) hypertension: Secondary | ICD-10-CM | POA: Diagnosis not present

## 2019-07-16 DIAGNOSIS — E782 Mixed hyperlipidemia: Secondary | ICD-10-CM | POA: Diagnosis not present

## 2019-07-16 DIAGNOSIS — I2510 Atherosclerotic heart disease of native coronary artery without angina pectoris: Secondary | ICD-10-CM | POA: Diagnosis not present

## 2019-07-16 DIAGNOSIS — I255 Ischemic cardiomyopathy: Secondary | ICD-10-CM | POA: Diagnosis not present

## 2019-07-16 DIAGNOSIS — Z9861 Coronary angioplasty status: Secondary | ICD-10-CM | POA: Diagnosis not present

## 2019-07-27 DIAGNOSIS — H34831 Tributary (branch) retinal vein occlusion, right eye, with macular edema: Secondary | ICD-10-CM | POA: Diagnosis not present

## 2019-07-29 ENCOUNTER — Other Ambulatory Visit: Payer: Self-pay | Admitting: Orthopedic Surgery

## 2019-07-29 DIAGNOSIS — M1711 Unilateral primary osteoarthritis, right knee: Secondary | ICD-10-CM

## 2019-07-29 DIAGNOSIS — M25561 Pain in right knee: Secondary | ICD-10-CM | POA: Diagnosis not present

## 2019-07-29 DIAGNOSIS — M25551 Pain in right hip: Secondary | ICD-10-CM | POA: Diagnosis not present

## 2019-07-31 ENCOUNTER — Ambulatory Visit
Admission: RE | Admit: 2019-07-31 | Discharge: 2019-07-31 | Disposition: A | Discharge status: Home / Self Care | Payer: Medicare HMO | Source: Ambulatory Visit | Attending: Orthopedic Surgery | Admitting: Orthopedic Surgery

## 2019-07-31 ENCOUNTER — Other Ambulatory Visit: Payer: Self-pay

## 2019-07-31 DIAGNOSIS — M1711 Unilateral primary osteoarthritis, right knee: Secondary | ICD-10-CM | POA: Diagnosis not present

## 2019-08-18 ENCOUNTER — Ambulatory Visit: Payer: Self-pay | Admitting: Surgery

## 2019-08-18 DIAGNOSIS — Z9889 Other specified postprocedural states: Secondary | ICD-10-CM | POA: Diagnosis not present

## 2019-08-18 DIAGNOSIS — E669 Obesity, unspecified: Secondary | ICD-10-CM | POA: Diagnosis not present

## 2019-08-18 DIAGNOSIS — G4733 Obstructive sleep apnea (adult) (pediatric): Secondary | ICD-10-CM | POA: Diagnosis not present

## 2019-08-18 DIAGNOSIS — E1142 Type 2 diabetes mellitus with diabetic polyneuropathy: Secondary | ICD-10-CM | POA: Diagnosis not present

## 2019-08-18 DIAGNOSIS — K432 Incisional hernia without obstruction or gangrene: Secondary | ICD-10-CM | POA: Diagnosis not present

## 2019-08-18 NOTE — H&P (Signed)
Subjective:   CC: Incisional hernia, without obstruction or gangrene [K43.2]   HPI:  Todd Spencer is a 69 y.o. male who is here for followup from above. New onset lump at inferior aspect of his open appendectomy incision site. Minimally symptomatic.     Current Medications: has a current medication list which includes the following prescription(s): aspirin, atorvastatin, bd alcohol swabs, accu-chek guide test strips, carvedilol, diazepam, donepezil, doxepin, famotidine, gemfibrozil, ibuprofen, insulin aspart, isosorbide dinitrate, lantus u-100 insulin, lubricant redness reliever, magnesium hydroxide, meclizine, metformin, montelukast, multivitamin, nitroglycerin, polyvinyl alcohol, pregabalin, and dulaglutide.  Allergies:  Allergies  Allergen Reactions  . Pollens Extract Unknown    ROS: General: Denies weight loss, weight gain, fatigue, fevers, chills, and night sweats. Heart: Denies chest pain, palpitations, racing heart, irregular heartbeat, leg pain or swelling, and decreased activity tolerance. Respiratory: Denies breathing difficulty, shortness of breath, wheezing, cough, and sputum. GI: Denies change in appetite, heartburn, nausea, vomiting, constipation, diarrhea, and blood in stool. GU: Denies difficulty urinating, pain with urinating, urgency, frequency, blood in urine    Objective:     BP 110/60   Pulse 75   Ht 177.8 cm (5\' 10" )   Wt (!) 105.7 kg (233 lb)   BMI 33.43 kg/m   Constitutional :  alert, appears stated age, cooperative and no distress  Gastrointestinal: soft, non-tender; bowel sounds normal; no masses,  no organomegaly.  Staples intact, JP with serosanguinous drainage  Musculoskeletal: Steady gait and movement  Skin: Cool and moist, incision healed.  Inferior most aspect at belt line is obviously palpable, non-tender incisional hernia easily reducible.  Defect measuring around 4cm.  No overlying skin changes  Psychiatric: Normal affect, non-agitated, not  confused       LABS:  n/a  RADS: N/A  Assessment:      Incisional hernia, without obstruction or gangrene [K43.2] s/p open appendectomy secondary to adhesions from previous surgeries. Returns with new onset incisional hernia OSA Hx of cardiac cath DM obesity   Plan:     1. Discussed the risk of surgery including recurrence, which can be up to 50% in the case of incisional or complex hernias, possible use of prosthetic materials (mesh) and the increased risk of mesh infxn if used, bleeding, chronic pain, post-op infxn, post-op SBO or ileus, and possible re-operation to address said risks. The risks of general anesthetic, if used, includes MI, CVA, sudden death or even reaction to anesthetic medications also discussed. Alternatives include continued observation.  Benefits include possible symptom relief, prevention of incarceration, strangulation, enlargement in size over time, and the risk of emergency surgery in the face of strangulation.   Typical post-op recovery time of 3-5 days with 2 weeks of activity restrictions were also discussed.  ED return precautions given for sudden increase in pain, size of hernia with accompanying fever, nausea, and/or vomiting.  The patient verbalized understanding and all questions were answered to the patient's satisfaction.  Will proceed with open repair due to inaccessible abdomen on last attempt.  He remains at high risk of recurrence due to previous surgery in the area, as well as above noted comorbidities.  Scheduled for a cardiac stress test this week.  He will need cardiac risk stratification completed prior to proceeding with surgery.  He verbalized understanding, still wishes to proceed with repair.  May require admission afterwards depending on extent of required repair.

## 2019-08-18 NOTE — H&P (View-Only) (Signed)
Subjective:   CC: Incisional hernia, without obstruction or gangrene [K43.2]   HPI:  Todd Spencer is a 69 y.o. male who is here for followup from above. New onset lump at inferior aspect of his open appendectomy incision site. Minimally symptomatic.     Current Medications: has a current medication list which includes the following prescription(s): aspirin, atorvastatin, bd alcohol swabs, accu-chek guide test strips, carvedilol, diazepam, donepezil, doxepin, famotidine, gemfibrozil, ibuprofen, insulin aspart, isosorbide dinitrate, lantus u-100 insulin, lubricant redness reliever, magnesium hydroxide, meclizine, metformin, montelukast, multivitamin, nitroglycerin, polyvinyl alcohol, pregabalin, and dulaglutide.  Allergies:  Allergies  Allergen Reactions  . Pollens Extract Unknown    ROS: General: Denies weight loss, weight gain, fatigue, fevers, chills, and night sweats. Heart: Denies chest pain, palpitations, racing heart, irregular heartbeat, leg pain or swelling, and decreased activity tolerance. Respiratory: Denies breathing difficulty, shortness of breath, wheezing, cough, and sputum. GI: Denies change in appetite, heartburn, nausea, vomiting, constipation, diarrhea, and blood in stool. GU: Denies difficulty urinating, pain with urinating, urgency, frequency, blood in urine    Objective:     BP 110/60   Pulse 75   Ht 177.8 cm (5\' 10" )   Wt (!) 105.7 kg (233 lb)   BMI 33.43 kg/m   Constitutional :  alert, appears stated age, cooperative and no distress  Gastrointestinal: soft, non-tender; bowel sounds normal; no masses,  no organomegaly.  Staples intact, JP with serosanguinous drainage  Musculoskeletal: Steady gait and movement  Skin: Cool and moist, incision healed.  Inferior most aspect at belt line is obviously palpable, non-tender incisional hernia easily reducible.  Defect measuring around 4cm.  No overlying skin changes  Psychiatric: Normal affect, non-agitated, not  confused       LABS:  n/a  RADS: N/A  Assessment:      Incisional hernia, without obstruction or gangrene [K43.2] s/p open appendectomy secondary to adhesions from previous surgeries. Returns with new onset incisional hernia OSA Hx of cardiac cath DM obesity   Plan:     1. Discussed the risk of surgery including recurrence, which can be up to 50% in the case of incisional or complex hernias, possible use of prosthetic materials (mesh) and the increased risk of mesh infxn if used, bleeding, chronic pain, post-op infxn, post-op SBO or ileus, and possible re-operation to address said risks. The risks of general anesthetic, if used, includes MI, CVA, sudden death or even reaction to anesthetic medications also discussed. Alternatives include continued observation.  Benefits include possible symptom relief, prevention of incarceration, strangulation, enlargement in size over time, and the risk of emergency surgery in the face of strangulation.   Typical post-op recovery time of 3-5 days with 2 weeks of activity restrictions were also discussed.  ED return precautions given for sudden increase in pain, size of hernia with accompanying fever, nausea, and/or vomiting.  The patient verbalized understanding and all questions were answered to the patient's satisfaction.  Will proceed with open repair due to inaccessible abdomen on last attempt.  He remains at high risk of recurrence due to previous surgery in the area, as well as above noted comorbidities.  Scheduled for a cardiac stress test this week.  He will need cardiac risk stratification completed prior to proceeding with surgery.  He verbalized understanding, still wishes to proceed with repair.  May require admission afterwards depending on extent of required repair.

## 2019-08-20 DIAGNOSIS — Z9861 Coronary angioplasty status: Secondary | ICD-10-CM | POA: Diagnosis not present

## 2019-08-20 DIAGNOSIS — I2510 Atherosclerotic heart disease of native coronary artery without angina pectoris: Secondary | ICD-10-CM | POA: Diagnosis not present

## 2019-08-20 DIAGNOSIS — I255 Ischemic cardiomyopathy: Secondary | ICD-10-CM | POA: Diagnosis not present

## 2019-08-26 DIAGNOSIS — M1711 Unilateral primary osteoarthritis, right knee: Secondary | ICD-10-CM | POA: Diagnosis not present

## 2019-08-31 DIAGNOSIS — H34831 Tributary (branch) retinal vein occlusion, right eye, with macular edema: Secondary | ICD-10-CM | POA: Diagnosis not present

## 2019-09-03 ENCOUNTER — Other Ambulatory Visit
Admission: RE | Admit: 2019-09-03 | Discharge: 2019-09-03 | Disposition: A | Discharge status: Home / Self Care | Payer: Medicare HMO | Source: Ambulatory Visit | Attending: Surgery | Admitting: Surgery

## 2019-09-03 ENCOUNTER — Other Ambulatory Visit: Payer: Self-pay

## 2019-09-03 NOTE — Pre-Procedure Instructions (Signed)
Copied from ED note dated same   EKG  ED ECG REPORT I, SUNG,JADE J, the attending physician, personally viewed and interpreted this ECG.   Date: 06/18/2019  EKG Time: 2314  Rate: 68  Rhythm: normal EKG, normal sinus rhythm  Axis: Normal  Intervals:none  ST&T Change: Nonspecific

## 2019-09-03 NOTE — Patient Instructions (Signed)
Your procedure is scheduled on: 09/10/19 Report to [place]. To find out your arrival time please call [phone] between 1PM - 3PM on 09/09/19.  Remember: Instructions that are not followed completely may result in serious medical risk, up to and including death, or upon the discretion of your surgeon and anesthesiologist your surgery may need to be rescheduled.     _X__ 1. Do not eat food after midnight the night before your procedure.                 No gum chewing or hard candies. You may drink clear liquids up to 2 hours                 before you are scheduled to arrive for your surgery- DO not drink clear                 liquids within 2 hours of the start of your surgery.                 Clear Liquids include:  water, apple juice without pulp, clear carbohydrate                 drink such as Clearfast or Gatorade, Black Coffee or Tea (Do not add                 anything to coffee or tea). Diabetics water only  __X__2.  On the morning of surgery brush your teeth with toothpaste and water, you                 may rinse your mouth with mouthwash if you wish.  Do not swallow any              toothpaste of mouthwash.     _X__ 3.  No Alcohol for 24 hours before or after surgery.   _X__ 4.  Do Not Smoke or use e-cigarettes For 24 Hours Prior to Your Surgery.                 Do not use any chewable tobacco products for at least 6 hours prior to                 surgery.  ____  5.  Bring all medications with you on the day of surgery if instructed.   __X__  6.  Notify your doctor if there is any change in your medical condition      (cold, fever, infections).     Do not wear jewelry, make-up, hairpins, clips or nail polish. Do not wear lotions, powders, or perfumes.  Do not shave 48 hours prior to surgery. Men may shave face and neck. Do not bring valuables to the hospital.    [facility] is not responsible for any belongings or  valuables.  Contacts, dentures/partials or body piercings may not be worn into surgery. Bring a case for your contacts, glasses or hearing aids, a denture cup will be supplied. Leave your suitcase in the car. After surgery it may be brought to your room. For patients admitted to the hospital, discharge time is determined by your treatment team.   Patients discharged the day of surgery will not be allowed to drive home.   Please read over the following fact sheets that you were given:   MRSA Information  __X__ Take these medicines the morning of surgery with A SIP OF WATER:  1. atorvastatin (LIPITOR) 40 MG tablet  2. carvedilol (COREG) 3.125 MG tablet  3. donepezil (ARICEPT) 10 MG tablet  4. doxepin (SINEQUAN) 100 MG capsule  5. famotidine (PEPCID) 40 MG tablet  6. isosorbide dinitrate (ISORDIL) 30 MG tablet  7. meclizine (ANTIVERT) 25 MG tablet  8. pregabalin (LYRICA) 300 MG capsule  ____ Fleet Enema (as directed)   __X__ Use CHG Soap/SAGE wipes as directed  ____ Use inhalers on the day of surgery  __X__ Stop metformin/Janumet/Farxiga 2 days prior to surgery    __X__ Take 1/2 of usual insulin dose the night before surgery. No insulin the morning          of surgery.   ____ Stop Blood Thinners Aspirin AS INSTRUCTED    Or contact your Surgeon, Cardiologist or Medical Doctor regarding  ability to stop your blood thinners  __X__ Stop Anti-inflammatories 7 days before surgery such as Advil, Ibuprofen, Motrin,  BC or Goodies Powder, Naprosyn, Naproxen, Aleve,   __X__ Stop all herbal supplements, fish oil or vitamin E until after surgery.    ____ Bring C-Pap to the hospital.

## 2019-09-07 DIAGNOSIS — M792 Neuralgia and neuritis, unspecified: Secondary | ICD-10-CM | POA: Diagnosis not present

## 2019-09-07 DIAGNOSIS — R413 Other amnesia: Secondary | ICD-10-CM | POA: Diagnosis not present

## 2019-09-08 ENCOUNTER — Other Ambulatory Visit: Payer: Self-pay

## 2019-09-08 ENCOUNTER — Other Ambulatory Visit
Admission: RE | Admit: 2019-09-08 | Discharge: 2019-09-08 | Disposition: A | Discharge status: Home / Self Care | Payer: Medicare HMO | Source: Ambulatory Visit | Attending: Surgery | Admitting: Surgery

## 2019-09-08 DIAGNOSIS — Z01812 Encounter for preprocedural laboratory examination: Secondary | ICD-10-CM | POA: Diagnosis not present

## 2019-09-08 DIAGNOSIS — Z20822 Contact with and (suspected) exposure to covid-19: Secondary | ICD-10-CM | POA: Insufficient documentation

## 2019-09-08 LAB — BASIC METABOLIC PANEL
Anion gap: 10 (ref 5–15)
BUN: 20 mg/dL (ref 8–23)
CO2: 25 mmol/L (ref 22–32)
Calcium: 9.3 mg/dL (ref 8.9–10.3)
Chloride: 104 mmol/L (ref 98–111)
Creatinine, Ser: 1.17 mg/dL (ref 0.61–1.24)
GFR calc Af Amer: >60 mL/min (ref >60)
GFR calc non Af Amer: >60 mL/min (ref >60)
Glucose, Bld: 163 mg/dL — ABNORMAL HIGH (ref 70–99)
Potassium: 3.9 mmol/L (ref 3.5–5.1)
Sodium: 139 mmol/L (ref 135–145)

## 2019-09-08 LAB — CBC
HCT: 42 % (ref 39.0–52.0)
Hemoglobin: 14.1 g/dL (ref 13.0–17.0)
MCH: 29.3 pg (ref 26.0–34.0)
MCHC: 33.6 g/dL (ref 30.0–36.0)
MCV: 87.1 fL (ref 80.0–100.0)
Platelets: 121 10*3/uL — ABNORMAL LOW (ref 150–400)
RBC: 4.82 MIL/uL (ref 4.22–5.81)
RDW: 14.3 % (ref 11.5–15.5)
WBC: 5.2 10*3/uL (ref 4.0–10.5)
nRBC: 0 % (ref 0.0–0.2)

## 2019-09-08 LAB — SARS CORONAVIRUS 2 (TAT 6-24 HRS): SARS Coronavirus 2: NEGATIVE
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
## 2019-09-10 ENCOUNTER — Inpatient Hospital Stay: Payer: Medicare HMO | Admitting: Urgent Care

## 2019-09-10 ENCOUNTER — Observation Stay
Admission: RE | Admit: 2019-09-10 | Discharge: 2019-09-11 | Disposition: A | Discharge status: Home / Self Care | Payer: Medicare HMO | Attending: Surgery | Admitting: Surgery

## 2019-09-10 ENCOUNTER — Encounter: Payer: Self-pay | Admitting: Surgery

## 2019-09-10 ENCOUNTER — Encounter
Admission: RE | Disposition: A | Discharge status: Home / Self Care | Payer: Self-pay | Source: Home / Self Care | Attending: Surgery

## 2019-09-10 ENCOUNTER — Other Ambulatory Visit: Payer: Self-pay

## 2019-09-10 DIAGNOSIS — N183 Chronic kidney disease, stage 3 unspecified: Secondary | ICD-10-CM | POA: Diagnosis not present

## 2019-09-10 DIAGNOSIS — E119 Type 2 diabetes mellitus without complications: Secondary | ICD-10-CM | POA: Diagnosis not present

## 2019-09-10 DIAGNOSIS — I25118 Atherosclerotic heart disease of native coronary artery with other forms of angina pectoris: Secondary | ICD-10-CM | POA: Diagnosis not present

## 2019-09-10 DIAGNOSIS — Z794 Long term (current) use of insulin: Secondary | ICD-10-CM | POA: Diagnosis not present

## 2019-09-10 DIAGNOSIS — K219 Gastro-esophageal reflux disease without esophagitis: Secondary | ICD-10-CM | POA: Diagnosis not present

## 2019-09-10 DIAGNOSIS — I252 Old myocardial infarction: Secondary | ICD-10-CM | POA: Diagnosis not present

## 2019-09-10 DIAGNOSIS — E1122 Type 2 diabetes mellitus with diabetic chronic kidney disease: Secondary | ICD-10-CM | POA: Diagnosis not present

## 2019-09-10 DIAGNOSIS — K432 Incisional hernia without obstruction or gangrene: Principal | ICD-10-CM | POA: Diagnosis present

## 2019-09-10 DIAGNOSIS — I129 Hypertensive chronic kidney disease with stage 1 through stage 4 chronic kidney disease, or unspecified chronic kidney disease: Secondary | ICD-10-CM | POA: Diagnosis not present

## 2019-09-10 HISTORY — PX: INCISIONAL HERNIA REPAIR: SHX193

## 2019-09-10 LAB — CBC
HCT: 38.6 % — ABNORMAL LOW (ref 39.0–52.0)
Hemoglobin: 13 g/dL (ref 13.0–17.0)
MCH: 29.5 pg (ref 26.0–34.0)
MCHC: 33.7 g/dL (ref 30.0–36.0)
MCV: 87.5 fL (ref 80.0–100.0)
Platelets: 114 10*3/uL — ABNORMAL LOW (ref 150–400)
RBC: 4.41 MIL/uL (ref 4.22–5.81)
RDW: 14 % (ref 11.5–15.5)
WBC: 8.2 10*3/uL (ref 4.0–10.5)
nRBC: 0 % (ref 0.0–0.2)

## 2019-09-10 LAB — GLUCOSE, CAPILLARY
Glucose-Capillary: 198 mg/dL — ABNORMAL HIGH (ref 70–99)
Glucose-Capillary: 249 mg/dL — ABNORMAL HIGH (ref 70–99)
Glucose-Capillary: 223 mg/dL — ABNORMAL HIGH (ref 70–99)
Glucose-Capillary: 221 mg/dL — ABNORMAL HIGH (ref 70–99)
Glucose-Capillary: 271 mg/dL — ABNORMAL HIGH (ref 70–99)
Glucose-Capillary: 263 mg/dL — ABNORMAL HIGH (ref 70–99)
Glucose-Capillary: 240 mg/dL — ABNORMAL HIGH (ref 70–99)
Glucose-Capillary: 196 mg/dL — ABNORMAL HIGH (ref 70–99)
Glucose-Capillary: 202 mg/dL — ABNORMAL HIGH (ref 70–99)

## 2019-09-10 LAB — CREATININE, SERUM
Creatinine, Ser: 1.28 mg/dL — ABNORMAL HIGH (ref 0.61–1.24)
GFR calc Af Amer: >60 mL/min (ref >60)
GFR calc non Af Amer: 56 mL/min — ABNORMAL LOW (ref >60)

## 2019-09-10 SURGERY — REPAIR, HERNIA, INCISIONAL
Anesthesia: General | Site: Abdomen

## 2019-09-10 MED ORDER — SUCCINYLCHOLINE CHLORIDE 200 MG/10ML IV SOSY
PREFILLED_SYRINGE | INTRAVENOUS | Status: AC
  Filled 2019-09-10: qty 10

## 2019-09-10 MED ORDER — DOXEPIN HCL 100 MG PO CAPS
100.0000 mg | ORAL_CAPSULE | Freq: Every day | ORAL | Status: DC
  Administered 2019-09-11: 100 mg via ORAL
  Filled 2019-09-10: qty 1

## 2019-09-10 MED ORDER — ACETAMINOPHEN 325 MG PO TABS: 325.0000 mg | ORAL_TABLET | ORAL | Status: DC | PRN

## 2019-09-10 MED ORDER — ONDANSETRON HCL 4 MG/2ML IJ SOLN
INTRAMUSCULAR | Status: DC | PRN
  Administered 2019-09-10: 4 mg via INTRAVENOUS

## 2019-09-10 MED ORDER — DEXAMETHASONE SODIUM PHOSPHATE 10 MG/ML IJ SOLN
INTRAMUSCULAR | Status: AC
  Filled 2019-09-10: qty 1

## 2019-09-10 MED ORDER — CELECOXIB 200 MG PO CAPS
ORAL_CAPSULE | ORAL | Status: AC
  Administered 2019-09-10: 200 mg via ORAL
  Filled 2019-09-10: qty 1

## 2019-09-10 MED ORDER — DONEPEZIL HCL 5 MG PO TABS
10.0000 mg | ORAL_TABLET | Freq: Every day | ORAL | Status: DC
  Administered 2019-09-10: 10 mg via ORAL
  Filled 2019-09-10 (×2): qty 2

## 2019-09-10 MED ORDER — CELECOXIB 200 MG PO CAPS: 200.0000 mg | ORAL_CAPSULE | ORAL | Status: AC

## 2019-09-10 MED ORDER — FAMOTIDINE 20 MG PO TABS
40.0000 mg | ORAL_TABLET | Freq: Every day | ORAL | Status: DC
  Administered 2019-09-10 – 2019-09-11 (×2): 40 mg via ORAL
  Filled 2019-09-10 (×2): qty 2

## 2019-09-10 MED ORDER — METOPROLOL TARTRATE 5 MG/5ML IV SOLN
INTRAVENOUS | Status: DC | PRN
Start: 2019-09-10 — End: 2019-09-10
  Administered 2019-09-10: 3 mg via INTRAVENOUS

## 2019-09-10 MED ORDER — SUCCINYLCHOLINE CHLORIDE 20 MG/ML IJ SOLN
INTRAMUSCULAR | Status: DC | PRN
  Administered 2019-09-10: 140 mg via INTRAVENOUS

## 2019-09-10 MED ORDER — ATORVASTATIN CALCIUM 20 MG PO TABS
40.0000 mg | ORAL_TABLET | Freq: Every day | ORAL | Status: DC
  Administered 2019-09-10 – 2019-09-11 (×2): 40 mg via ORAL
  Filled 2019-09-10 (×2): qty 2

## 2019-09-10 MED ORDER — SUGAMMADEX SODIUM 200 MG/2ML IV SOLN
INTRAVENOUS | Status: DC | PRN
  Administered 2019-09-10: 200 mg via INTRAVENOUS

## 2019-09-10 MED ORDER — INSULIN ASPART 100 UNIT/ML ~~LOC~~ SOLN
SUBCUTANEOUS | Status: AC
  Administered 2019-09-10: 10 [IU] via SUBCUTANEOUS
  Filled 2019-09-10: qty 1

## 2019-09-10 MED ORDER — INSULIN ASPART 100 UNIT/ML ~~LOC~~ SOLN: 10.0000 [IU] | Freq: Once | SUBCUTANEOUS | Status: AC

## 2019-09-10 MED ORDER — METOPROLOL TARTRATE 5 MG/5ML IV SOLN
INTRAVENOUS | Status: AC
  Filled 2019-09-10: qty 5

## 2019-09-10 MED ORDER — DEXAMETHASONE SODIUM PHOSPHATE 10 MG/ML IJ SOLN
INTRAMUSCULAR | Status: DC | PRN
  Administered 2019-09-10: 4 mg via INTRAVENOUS

## 2019-09-10 MED ORDER — BUPIVACAINE LIPOSOME 1.3 % IJ SUSP
INTRAMUSCULAR | Status: AC
  Filled 2019-09-10: qty 20

## 2019-09-10 MED ORDER — PROPOFOL 10 MG/ML IV BOLUS
INTRAVENOUS | Status: DC | PRN
  Administered 2019-09-10: 120 mg via INTRAVENOUS

## 2019-09-10 MED ORDER — ACETAMINOPHEN 500 MG PO TABS: 1000.0000 mg | ORAL_TABLET | ORAL | Status: AC

## 2019-09-10 MED ORDER — ROCURONIUM BROMIDE 100 MG/10ML IV SOLN
INTRAVENOUS | Status: DC | PRN
  Administered 2019-09-10: 25 mg via INTRAVENOUS
  Administered 2019-09-10: 10 mg via INTRAVENOUS

## 2019-09-10 MED ORDER — INSULIN ASPART 100 UNIT/ML ~~LOC~~ SOLN: 2.0000 [IU] | Freq: Once | SUBCUTANEOUS | Status: AC

## 2019-09-10 MED ORDER — HYDROCODONE-ACETAMINOPHEN 7.5-325 MG PO TABS
1.0000 | ORAL_TABLET | Freq: Once | ORAL | Status: DC | PRN
  Filled 2019-09-10: qty 1

## 2019-09-10 MED ORDER — GEMFIBROZIL 600 MG PO TABS
600.0000 mg | ORAL_TABLET | Freq: Two times a day (BID) | ORAL | Status: DC
  Administered 2019-09-10 – 2019-09-11 (×2): 600 mg via ORAL
  Filled 2019-09-10 (×3): qty 1

## 2019-09-10 MED ORDER — FAMOTIDINE 20 MG PO TABS: 20.0000 mg | ORAL_TABLET | Freq: Once | ORAL | Status: AC

## 2019-09-10 MED ORDER — NITROGLYCERIN 0.4 MG SL SUBL: 0.4000 mg | SUBLINGUAL_TABLET | SUBLINGUAL | Status: DC | PRN

## 2019-09-10 MED ORDER — PROPOFOL 500 MG/50ML IV EMUL
INTRAVENOUS | Status: AC
  Filled 2019-09-10: qty 50

## 2019-09-10 MED ORDER — ISOSORBIDE DINITRATE 30 MG PO TABS
30.0000 mg | ORAL_TABLET | Freq: Three times a day (TID) | ORAL | Status: DC
  Administered 2019-09-10 – 2019-09-11 (×2): 30 mg via ORAL
  Filled 2019-09-10 (×4): qty 1

## 2019-09-10 MED ORDER — CEFAZOLIN SODIUM-DEXTROSE 2-4 GM/100ML-% IV SOLN
2.0000 g | INTRAVENOUS | Status: AC
  Administered 2019-09-10: 2 g via INTRAVENOUS

## 2019-09-10 MED ORDER — DIAZEPAM 5 MG PO TABS: 5.0000 mg | ORAL_TABLET | Freq: Two times a day (BID) | ORAL | Status: DC | PRN

## 2019-09-10 MED ORDER — ONDANSETRON 4 MG PO TBDP: 4.0000 mg | ORAL_TABLET | Freq: Four times a day (QID) | ORAL | Status: DC | PRN

## 2019-09-10 MED ORDER — INSULIN ASPART 100 UNIT/ML ~~LOC~~ SOLN
40.0000 [IU] | Freq: Three times a day (TID) | SUBCUTANEOUS | Status: DC
  Administered 2019-09-10: 40 [IU] via SUBCUTANEOUS
  Filled 2019-09-10: qty 1

## 2019-09-10 MED ORDER — INSULIN ASPART 100 UNIT/ML ~~LOC~~ SOLN
SUBCUTANEOUS | Status: AC
  Filled 2019-09-10: qty 1

## 2019-09-10 MED ORDER — PHENYLEPHRINE HCL (PRESSORS) 10 MG/ML IV SOLN
INTRAVENOUS | Status: DC | PRN
  Administered 2019-09-10 (×2): 100 ug via INTRAVENOUS

## 2019-09-10 MED ORDER — EPHEDRINE 5 MG/ML INJ
INTRAVENOUS | Status: AC
  Filled 2019-09-10: qty 10

## 2019-09-10 MED ORDER — FENTANYL CITRATE (PF) 100 MCG/2ML IJ SOLN: 25.0000 ug | INTRAMUSCULAR | Status: DC | PRN

## 2019-09-10 MED ORDER — PHENYLEPHRINE HCL-NACL 10-0.9 MG/250ML-% IV SOLN
INTRAVENOUS | Status: DC | PRN
Start: 2019-09-10 — End: 2019-09-10
  Administered 2019-09-10: 50 ug/min via INTRAVENOUS

## 2019-09-10 MED ORDER — IBUPROFEN 400 MG PO TABS: 600.0000 mg | ORAL_TABLET | Freq: Four times a day (QID) | ORAL | Status: DC | PRN

## 2019-09-10 MED ORDER — LIDOCAINE HCL (PF) 2 % IJ SOLN
INTRAMUSCULAR | Status: AC
  Filled 2019-09-10: qty 5

## 2019-09-10 MED ORDER — ONDANSETRON HCL 4 MG/2ML IJ SOLN
INTRAMUSCULAR | Status: AC
  Filled 2019-09-10: qty 2

## 2019-09-10 MED ORDER — MAGNESIUM HYDROXIDE 400 MG/5ML PO SUSP
15.0000 mL | Freq: Every day | ORAL | Status: DC
  Filled 2019-09-10: qty 30

## 2019-09-10 MED ORDER — PREGABALIN 75 MG PO CAPS
300.0000 mg | ORAL_CAPSULE | Freq: Two times a day (BID) | ORAL | Status: DC
  Administered 2019-09-10 – 2019-09-11 (×2): 300 mg via ORAL
  Filled 2019-09-10 (×2): qty 4

## 2019-09-10 MED ORDER — FENTANYL CITRATE (PF) 100 MCG/2ML IJ SOLN
INTRAMUSCULAR | Status: DC | PRN
  Administered 2019-09-10 (×2): 50 ug via INTRAVENOUS

## 2019-09-10 MED ORDER — ACETAMINOPHEN 160 MG/5ML PO SOLN
325.0000 mg | ORAL | Status: DC | PRN
  Filled 2019-09-10: qty 20.3

## 2019-09-10 MED ORDER — ACETAMINOPHEN 325 MG PO TABS
650.0000 mg | ORAL_TABLET | Freq: Four times a day (QID) | ORAL | Status: DC | PRN
  Administered 2019-09-11: 650 mg via ORAL
  Filled 2019-09-10: qty 2

## 2019-09-10 MED ORDER — TRAMADOL HCL 50 MG PO TABS: 50.0000 mg | ORAL_TABLET | Freq: Four times a day (QID) | ORAL | Status: DC | PRN

## 2019-09-10 MED ORDER — ACETAMINOPHEN 500 MG PO TABS
ORAL_TABLET | ORAL | Status: AC
  Administered 2019-09-10: 1000 mg via ORAL
  Filled 2019-09-10: qty 2

## 2019-09-10 MED ORDER — LIDOCAINE HCL (CARDIAC) PF 100 MG/5ML IV SOSY
PREFILLED_SYRINGE | INTRAVENOUS | Status: DC | PRN
  Administered 2019-09-10: 100 mg via INTRAVENOUS

## 2019-09-10 MED ORDER — SODIUM CHLORIDE 0.9 % IV SOLN: INTRAVENOUS | Status: DC

## 2019-09-10 MED ORDER — MIDAZOLAM HCL 2 MG/2ML IJ SOLN
INTRAMUSCULAR | Status: AC
  Filled 2019-09-10: qty 2

## 2019-09-10 MED ORDER — CHLORHEXIDINE GLUCONATE 0.12 % MT SOLN
OROMUCOSAL | Status: AC
  Administered 2019-09-10: 15 mL via OROMUCOSAL
  Filled 2019-09-10: qty 15

## 2019-09-10 MED ORDER — DOXEPIN HCL 100 MG PO CAPS
200.0000 mg | ORAL_CAPSULE | Freq: Every day | ORAL | Status: DC
  Administered 2019-09-10: 200 mg via ORAL
  Filled 2019-09-10 (×2): qty 2

## 2019-09-10 MED ORDER — BUPIVACAINE HCL (PF) 0.5 % IJ SOLN
INTRAMUSCULAR | Status: AC
  Filled 2019-09-10: qty 30

## 2019-09-10 MED ORDER — CLINDAMYCIN PHOSPHATE 900 MG/50ML IV SOLN
INTRAVENOUS | Status: AC
  Filled 2019-09-10: qty 50

## 2019-09-10 MED ORDER — INSULIN GLARGINE 100 UNIT/ML ~~LOC~~ SOLN
100.0000 [IU] | Freq: Every day | SUBCUTANEOUS | Status: DC
  Administered 2019-09-10: 100 [IU] via SUBCUTANEOUS
  Filled 2019-09-10 (×3): qty 1

## 2019-09-10 MED ORDER — FAMOTIDINE 20 MG PO TABS
ORAL_TABLET | ORAL | Status: AC
  Administered 2019-09-10: 20 mg
  Filled 2019-09-10: qty 1

## 2019-09-10 MED ORDER — ACETAMINOPHEN 325 MG PO TABS
ORAL_TABLET | ORAL | Status: AC
  Administered 2019-09-10: 325 mg via ORAL
  Filled 2019-09-10: qty 2

## 2019-09-10 MED ORDER — HYDROCODONE-ACETAMINOPHEN 5-325 MG PO TABS
1.0000 | ORAL_TABLET | ORAL | Status: DC | PRN
  Administered 2019-09-10 – 2019-09-11 (×3): 1 via ORAL
  Filled 2019-09-10 (×3): qty 1

## 2019-09-10 MED ORDER — SODIUM CHLORIDE 0.9 % IV SOLN
INTRAVENOUS | Status: DC | PRN
  Administered 2019-09-10: 100 mL

## 2019-09-10 MED ORDER — INSULIN ASPART 100 UNIT/ML ~~LOC~~ SOLN
40.0000 [IU] | Freq: Three times a day (TID) | SUBCUTANEOUS | Status: DC
  Administered 2019-09-11 (×2): 40 [IU] via SUBCUTANEOUS
  Filled 2019-09-10 (×2): qty 1

## 2019-09-10 MED ORDER — GABAPENTIN 300 MG PO CAPS
ORAL_CAPSULE | ORAL | Status: AC
  Administered 2019-09-10: 300 mg
  Filled 2019-09-10: qty 1

## 2019-09-10 MED ORDER — CEFAZOLIN SODIUM-DEXTROSE 2-4 GM/100ML-% IV SOLN
INTRAVENOUS | Status: AC
  Filled 2019-09-10: qty 100

## 2019-09-10 MED ORDER — EPHEDRINE SULFATE 50 MG/ML IJ SOLN
INTRAMUSCULAR | Status: DC | PRN
  Administered 2019-09-10: 5 mg via INTRAVENOUS
  Administered 2019-09-10 (×2): 10 mg via INTRAVENOUS

## 2019-09-10 MED ORDER — CHLORHEXIDINE GLUCONATE 0.12 % MT SOLN: 15.0000 mL | Freq: Once | OROMUCOSAL | Status: AC

## 2019-09-10 MED ORDER — MECLIZINE HCL 25 MG PO TABS
25.0000 mg | ORAL_TABLET | Freq: Two times a day (BID) | ORAL | Status: DC
  Administered 2019-09-10 – 2019-09-11 (×2): 25 mg via ORAL
  Filled 2019-09-10 (×3): qty 1

## 2019-09-10 MED ORDER — ROCURONIUM BROMIDE 10 MG/ML (PF) SYRINGE
PREFILLED_SYRINGE | INTRAVENOUS | Status: AC
  Filled 2019-09-10: qty 10

## 2019-09-10 MED ORDER — INSULIN ASPART 100 UNIT/ML ~~LOC~~ SOLN
2.0000 [IU] | Freq: Once | SUBCUTANEOUS | Status: AC
  Administered 2019-09-10: 2 [IU] via SUBCUTANEOUS

## 2019-09-10 MED ORDER — FENTANYL CITRATE (PF) 100 MCG/2ML IJ SOLN
INTRAMUSCULAR | Status: AC
  Filled 2019-09-10: qty 2

## 2019-09-10 MED ORDER — DOXEPIN HCL 100 MG PO CAPS: 100.0000 mg | ORAL_CAPSULE | ORAL | Status: DC

## 2019-09-10 MED ORDER — ORAL CARE MOUTH RINSE: 15.0000 mL | Freq: Once | OROMUCOSAL | Status: AC

## 2019-09-10 MED ORDER — INSULIN ASPART 100 UNIT/ML ~~LOC~~ SOLN
SUBCUTANEOUS | Status: AC
  Administered 2019-09-10: 2 [IU] via SUBCUTANEOUS
  Filled 2019-09-10: qty 1

## 2019-09-10 MED ORDER — ONDANSETRON HCL 4 MG/2ML IJ SOLN: 4.0000 mg | Freq: Four times a day (QID) | INTRAMUSCULAR | Status: DC | PRN

## 2019-09-10 MED ORDER — ADULT MULTIVITAMIN W/MINERALS CH
1.0000 | ORAL_TABLET | Freq: Every day | ORAL | Status: DC
  Administered 2019-09-10 – 2019-09-11 (×2): 1 via ORAL
  Filled 2019-09-10 (×2): qty 1

## 2019-09-10 MED ORDER — PROMETHAZINE HCL 25 MG/ML IJ SOLN: 6.2500 mg | INTRAMUSCULAR | Status: DC | PRN

## 2019-09-10 MED ORDER — PHENYLEPHRINE HCL (PRESSORS) 10 MG/ML IV SOLN
INTRAVENOUS | Status: AC
  Filled 2019-09-10: qty 1

## 2019-09-10 MED ORDER — CHLORHEXIDINE GLUCONATE CLOTH 2 % EX PADS: 6.0000 | MEDICATED_PAD | Freq: Once | CUTANEOUS | Status: DC

## 2019-09-10 MED ORDER — CARVEDILOL 6.25 MG PO TABS
3.1250 mg | ORAL_TABLET | Freq: Two times a day (BID) | ORAL | Status: DC
  Administered 2019-09-10 – 2019-09-11 (×2): 3.125 mg via ORAL
  Filled 2019-09-10 (×2): qty 1

## 2019-09-10 MED ORDER — ENOXAPARIN SODIUM 40 MG/0.4ML ~~LOC~~ SOLN
40.0000 mg | SUBCUTANEOUS | Status: DC
  Administered 2019-09-11: 40 mg via SUBCUTANEOUS
  Filled 2019-09-10: qty 0.4

## 2019-09-10 SURGICAL SUPPLY — 39 items
ADH SKN CLS APL DERMABOND .7 (GAUZE/BANDAGES/DRESSINGS) ×1
APL PRP STRL LF DISP 70% ISPRP (MISCELLANEOUS) ×1
BINDER ABDOMINAL 12 ML 46-62 (SOFTGOODS) ×1 IMPLANT
BLADE SURG 15 STRL LF DISP TIS (BLADE) ×1 IMPLANT
BLADE SURG 15 STRL SS (BLADE) ×2
CANISTER SUCT 1200ML W/VALVE (MISCELLANEOUS) ×2 IMPLANT
CHLORAPREP W/TINT 26 (MISCELLANEOUS) ×2 IMPLANT
COVER WAND RF STERILE (DRAPES) ×2 IMPLANT
DERMABOND ADVANCED (GAUZE/BANDAGES/DRESSINGS) ×1
DERMABOND ADVANCED .7 DNX12 (GAUZE/BANDAGES/DRESSINGS) ×1 IMPLANT
DRAPE LAPAROTOMY 100X77 ABD (DRAPES) ×2 IMPLANT
DRSG OPSITE POSTOP 4X8 (GAUZE/BANDAGES/DRESSINGS) ×1 IMPLANT
ELECT CAUTERY BLADE 6.4 (BLADE) ×2 IMPLANT
ELECT REM PT RETURN 9FT ADLT (ELECTROSURGICAL) ×2
ELECTRODE REM PT RTRN 9FT ADLT (ELECTROSURGICAL) ×1 IMPLANT
GAUZE 4X4 16PLY RFD (DISPOSABLE) ×1 IMPLANT
GLOVE BIOGEL PI IND STRL 7.0 (GLOVE) ×1 IMPLANT
GLOVE BIOGEL PI INDICATOR 7.0 (GLOVE) ×1
GLOVE SURG SYN 6.5 ES PF (GLOVE) ×2 IMPLANT
GLOVE SURG SYN 6.5 PF PI (GLOVE) ×1 IMPLANT
GOWN STRL REUS W/ TWL LRG LVL3 (GOWN DISPOSABLE) ×2 IMPLANT
GOWN STRL REUS W/TWL LRG LVL3 (GOWN DISPOSABLE) ×4
KIT TURNOVER KIT A (KITS) ×2 IMPLANT
LABEL OR SOLS (LABEL) ×1 IMPLANT
MESH VENT ST 11X14CM M OVL (Mesh General) ×1 IMPLANT
NEEDLE HYPO 22GX1.5 SAFETY (NEEDLE) ×4 IMPLANT
NS IRRIG 500ML POUR BTL (IV SOLUTION) ×2 IMPLANT
PACK BASIN MINOR (MISCELLANEOUS) ×2 IMPLANT
SUT ETHIBOND NAB MO 7 #0 18IN (SUTURE) ×3 IMPLANT
SUT MNCRL 4-0 (SUTURE) ×2
SUT MNCRL 4-0 27XMFL (SUTURE) ×1
SUT PDS AB 0 CT1 27 (SUTURE) ×2 IMPLANT
SUT VIC AB 3-0 SH 27 (SUTURE) ×4
SUT VIC AB 3-0 SH 27X BRD (SUTURE) ×2 IMPLANT
SUTURE MNCRL 4-0 27XMF (SUTURE) ×1 IMPLANT
SYR 10ML LL (SYRINGE) ×4 IMPLANT
SYR 20ML LL LF (SYRINGE) ×2 IMPLANT
TRAY FOLEY SLVR 16FR LF STAT (SET/KITS/TRAYS/PACK) ×1 IMPLANT
WATER STERILE IRR 1000ML POUR (IV SOLUTION) ×1 IMPLANT

## 2019-09-10 NOTE — Anesthesia Postprocedure Evaluation (Signed)
Anesthesia Post Note  Patient: Todd Spencer  Procedure(s) Performed: HERNIA REPAIR INCISIONAL (N/A Abdomen)  Patient location during evaluation: PACU Anesthesia Type: General Level of consciousness: awake and alert Pain management: pain level controlled Vital Signs Assessment: post-procedure vital signs reviewed and stable Respiratory status: spontaneous breathing, nonlabored ventilation and respiratory function stable Cardiovascular status: blood pressure returned to baseline and stable Postop Assessment: no apparent nausea or vomiting Anesthetic complications: no Comments: Insulin to treat BS   No complications documented.   Last Vitals:  Vitals:   09/10/19 1114 09/10/19 1211  BP: (!) 144/84 (!) 144/79  Pulse: 52 70  Resp: 15 16  Temp: (!) 36.2 C   SpO2: 97% 96%    Last Pain:  Vitals:   09/10/19 1251  TempSrc:   PainSc: 4                  Ellanor Feuerstein Harvie Heck
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
## 2019-09-10 NOTE — Interval H&P Note (Signed)
History and Physical Interval Note:  09/10/2019 7:13 AM  Todd Spencer  has presented today for surgery, with the diagnosis of K43.2 - Incisional hernia without obstruction or gangrene.  The various methods of treatment have been discussed with the patient and family. After consideration of risks, benefits and other options for treatment, the patient has consented to  Procedure(s): HERNIA REPAIR INCISIONAL (N/A) as a surgical intervention.  The patient's history has been reviewed, patient examined, no change in status, stable for surgery.  I have reviewed the patient's chart and labs.  Questions were answered to the patient's satisfaction.     Jered Heiny Lysle Pearl

## 2019-09-10 NOTE — Anesthesia Procedure Notes (Signed)
Procedure Name: Intubation Date/Time: 09/10/2019 7:35 AM Performed by: Justus Memory, CRNA Pre-anesthesia Checklist: Patient identified, Patient being monitored, Timeout performed, Emergency Drugs available and Suction available Patient Re-evaluated:Patient Re-evaluated prior to induction Oxygen Delivery Method: Circle system utilized Preoxygenation: Pre-oxygenation with 100% oxygen Induction Type: IV induction Ventilation: Mask ventilation without difficulty Laryngoscope Size: 3 and McGraph Grade View: Grade I Tube type: Oral Tube size: 7.0 mm Number of attempts: 1 Airway Equipment and Method: Stylet and Video-laryngoscopy Placement Confirmation: ETT inserted through vocal cords under direct vision,  positive ETCO2 and breath sounds checked- equal and bilateral Secured at: 20 cm Tube secured with: Tape Dental Injury: Teeth and Oropharynx as per pre-operative assessment

## 2019-09-10 NOTE — Op Note (Signed)
Preoperative diagnosis: Recurrent incisional hernia, reducible Postoperative diagnosis: same  Procedure:  Open recurrent incisional hernia repair with mesh  Anesthesia: GETA  Surgeon: Benjamine Sprague  Wound Classification: Clean  Specimen: none  Complications: None  Estimated Blood Loss: 20 mL  Indications:see HPI  Findings: 1. 3 x 2.8 cm recurrent incisional hernia  2. Tension free repair achieved with mesh within the retrorectus space and primary closure of defect  3. Adequate hemostasis  Description of procedure: The patient was brought to the operating room and general anesthesia was induced. A time-out was completed verifying correct patient, procedure, site, positioning, and implant(s) and/or special equipment prior to beginning this procedure. Antibiotics were administered prior to making the incision. SCDs placed. The anterior abdominal wall was prepped and draped in the standard sterile fashion.   An infraumbilical incision was made over the easily palpable incisional hernia.  Dissection carried down to fascia where a 3 x 2.8 cm recurrent incisional hernia was noted.  Surrounding fascia and tissue were then cleared, and the retrorectus space was able to be entered from the left lateral aspect.  Extensive scarring was noted along the midline but slightly lateral to this the retrorectus space was untouched and very easily accessible.  The retrorectus space was cleared from scar tissue and attachments until a 11 x 14 cm ventral light ST mesh was able to be easily placed over the hernia defect.  After confirming no active bleeding or damage to the hernia sac and contents that was reduced during the dissection portion, the posterior fascia was easily approximated using interrupted 0 Ethibond sutures to close the defect.  Exparel expanded with 30 mL of Marcaine and 50 mL's of saline was then used to create a block along the incision and hernia defect, prior to placing the mesh over the newly  repaired posterior fascia, secured in place at the cardinal points using 0 Ethibond.  The rectus muscle and anterior fascia was then approximated over the mesh by using 0 PDS x2.  The wound was extensively irrigated prior to approximating the fascial layers.  Skin was then closed with staples.  Patient was then successfully awakened and transferred to PACU in stable condition.  At the end of the procedure sponge and instrument counts were correct

## 2019-09-10 NOTE — Transfer of Care (Signed)
Immediate Anesthesia Transfer of Care Note  Patient: Todd Spencer  Procedure(s) Performed: HERNIA REPAIR INCISIONAL (N/A Abdomen)  Patient Location: PACU  Anesthesia Type:General  Level of Consciousness: sedated  Airway & Oxygen Therapy: Patient Spontanous Breathing and Patient connected to face mask oxygen  Post-op Assessment: Report given to RN and Post -op Vital signs reviewed and stable  Post vital signs: Reviewed and stable  Last Vitals:  Vitals Value Taken Time  BP 154/86 09/10/19 0944  Temp    Pulse 73 09/10/19 0950  Resp 12 09/10/19 0950  SpO2 99 % 09/10/19 0950  Vitals shown include unvalidated device data.  Last Pain:  Vitals:   09/10/19 0650  TempSrc: Tympanic  PainSc: 0-No pain      Patients Stated Pain Goal: 0 (51/83/35 8251)  Complications: No complications documented.

## 2019-09-11 ENCOUNTER — Encounter: Payer: Self-pay | Admitting: Surgery

## 2019-09-11 DIAGNOSIS — Z794 Long term (current) use of insulin: Secondary | ICD-10-CM | POA: Diagnosis not present

## 2019-09-11 DIAGNOSIS — E119 Type 2 diabetes mellitus without complications: Secondary | ICD-10-CM | POA: Diagnosis not present

## 2019-09-11 DIAGNOSIS — K432 Incisional hernia without obstruction or gangrene: Secondary | ICD-10-CM | POA: Diagnosis not present

## 2019-09-11 LAB — CBC
HCT: 36.5 % — ABNORMAL LOW (ref 39.0–52.0)
Hemoglobin: 12.9 g/dL — ABNORMAL LOW (ref 13.0–17.0)
MCH: 30.1 pg (ref 26.0–34.0)
MCHC: 35.3 g/dL (ref 30.0–36.0)
MCV: 85.3 fL (ref 80.0–100.0)
Platelets: 120 10*3/uL — ABNORMAL LOW (ref 150–400)
RBC: 4.28 MIL/uL (ref 4.22–5.81)
RDW: 14.2 % (ref 11.5–15.5)
WBC: 10 10*3/uL (ref 4.0–10.5)
nRBC: 0 % (ref 0.0–0.2)

## 2019-09-11 LAB — GLUCOSE, CAPILLARY
Glucose-Capillary: 170 mg/dL — ABNORMAL HIGH (ref 70–99)
Glucose-Capillary: 119 mg/dL — ABNORMAL HIGH (ref 70–99)

## 2019-09-11 LAB — BASIC METABOLIC PANEL
Anion gap: 8 (ref 5–15)
BUN: 22 mg/dL (ref 8–23)
CO2: 27 mmol/L (ref 22–32)
Calcium: 9 mg/dL (ref 8.9–10.3)
Chloride: 103 mmol/L (ref 98–111)
Creatinine, Ser: 1.16 mg/dL (ref 0.61–1.24)
GFR calc Af Amer: >60 mL/min (ref >60)
GFR calc non Af Amer: >60 mL/min (ref >60)
Glucose, Bld: 190 mg/dL — ABNORMAL HIGH (ref 70–99)
Potassium: 4.4 mmol/L (ref 3.5–5.1)
Sodium: 138 mmol/L (ref 135–145)

## 2019-09-11 MED ORDER — SILVER NITRATE-POT NITRATE 75-25 % EX MISC
1.0000 "application " | Freq: Once | CUTANEOUS | Status: DC
  Filled 2019-09-11: qty 1

## 2019-09-11 MED ORDER — HYDROCODONE-ACETAMINOPHEN 5-325 MG PO TABS: 1.0000 | ORAL_TABLET | Freq: Four times a day (QID) | ORAL | 0 refills | Status: DC | PRN

## 2019-09-11 MED ORDER — ACETAMINOPHEN 325 MG PO TABS: 650.0000 mg | ORAL_TABLET | Freq: Three times a day (TID) | ORAL | 0 refills | Status: AC | PRN

## 2019-09-11 MED ORDER — IBUPROFEN 800 MG PO TABS
800.0000 mg | ORAL_TABLET | Freq: Three times a day (TID) | ORAL | 0 refills | Status: DC | PRN
Start: 2019-09-11 — End: 2020-02-17

## 2019-09-11 NOTE — Discharge Summary (Signed)
Physician Discharge Summary  Patient ID: Todd Spencer MRN: [id number] DOB/AGE: 71-22-1952 71 y.o.  Admit date: 09/10/2019 Discharge date: 09/12/19  Admission Diagnoses: incisional  hernia  Discharge Diagnoses:  Same as above  Discharged Condition: good  Hospital Course: admitted to obs for above.  Had some skin bleed from incision x2, controlled with silver nitrate.  No evidence of deeper source of bleed, hgb stable overnight.  Tolerating diet, pain controlled, so ready to d/c this am.  Consults: None  Discharge Exam: Blood pressure (!) 172/86, pulse 85, temperature 97.6 F (36.4 C), temperature source Axillary, resp. rate 17, height 5' 10.5" (1.791 m), weight 106.2 kg, SpO2 98 %. General appearance: alert, cooperative and no distress GI: soft, non-tender; bowel sounds normal; no masses,  no organomegaly and incision staples intact, no sign of bleeding. area with most bleeding noted on dressing has some visible granulation tissue.  silver nitrate applied at bedside.  Disposition:  Discharge disposition: 01-Home or Self Care       Discharge Instructions    Discharge patient   Complete by: As directed    Discharge disposition: 01-Home or Self Care   Discharge patient date: 09/11/2019     Allergies as of 09/11/2019   No Known Allergies     Medication List    TAKE these medications   acetaminophen 325 MG tablet Commonly known as: Tylenol Take 2 tablets (650 mg total) by mouth every 8 (eight) hours as needed for mild pain.   aspirin 325 MG EC tablet Take 1 tablet (325 mg total) by mouth daily with breakfast. Notes to patient: RESTART ON 09/12/19   atorvastatin 40 MG tablet Commonly known as: LIPITOR Take 40 mg by mouth daily.   carvedilol 3.125 MG tablet Commonly known as: COREG Take 3.125 mg by mouth 2 (two) times daily with a meal.   diazepam 5 MG tablet Commonly known as: VALIUM Take 5 mg by mouth every 12 (twelve) hours as needed for anxiety.   donepezil  10 MG tablet Commonly known as: ARICEPT Take 10 mg by mouth daily.   doxepin 100 MG capsule Commonly known as: SINEQUAN Take 100-200 mg by mouth See admin instructions. Take 100 mg in the morning and 200 mg at night   famotidine 40 MG tablet Commonly known as: PEPCID Take 40 mg by mouth daily.   gemfibrozil 600 MG tablet Commonly known as: LOPID Take 600 mg by mouth 2 (two) times daily before a meal.   HYDROcodone-acetaminophen 5-325 MG tablet Commonly known as: Norco Take 1 tablet by mouth every 6 (six) hours as needed for up to 6 doses for moderate pain.   ibuprofen 800 MG tablet Commonly known as: ADVIL Take 1 tablet (800 mg total) by mouth every 8 (eight) hours as needed for mild pain or moderate pain.   insulin glargine 100 UNIT/ML injection Commonly known as: LANTUS Inject 1 mL (100 Units total) into the skin at bedtime.   insulin regular 100 units/mL injection Commonly known as: NOVOLIN R Inject 40 Units into the skin 3 (three) times daily.   isosorbide dinitrate 30 MG tablet Commonly known as: ISORDIL Take 30 mg by mouth 3 (three) times daily.   magnesium hydroxide 400 MG/5ML suspension Commonly known as: MILK OF MAGNESIA Take 15 mLs by mouth at bedtime.   meclizine 25 MG tablet Commonly known as: ANTIVERT Take 25 mg by mouth 2 (two) times daily.   metFORMIN 500 MG 24 hr tablet Commonly known as: GLUCOPHAGE-XR Take 1,500 mg  by mouth daily.   multivitamin with minerals Tabs tablet Take 1 tablet by mouth daily.   nitroGLYCERIN 0.4 MG SL tablet Commonly known as: NITROSTAT Place 0.4 mg under the tongue every 5 (five) minutes as needed for chest pain.   pregabalin 300 MG capsule Commonly known as: LYRICA Take 300 mg by mouth 2 (two) times daily.       Follow-up Information    Follow up In 2 weeks.   Why: post op wound check               Total time spent arranging discharge was >39min. Signed: Benjamine Sprague 09/11/2019, 8:47 AM

## 2019-09-11 NOTE — Progress Notes (Signed)
Pt is refusing PIV placement. Pt removed his original PIV accidentally while asleep. Pt states " I'm going home today. I don't need one. I'm going to wait until the doctor sees me." Education provided. Will continue to monitor.

## 2019-09-11 NOTE — Progress Notes (Signed)
[place] V [place] A and O x4. VSS. Pt tolerating diet well. No complaints of nausea or vomiting. IV removed intact, prescriptions given. Pt voices understanding of discharge instructions with no further questions. Patient discharged via wheelchair with NT  Allergies as of 09/11/2019   No Known Allergies     Medication List    TAKE these medications   acetaminophen 325 MG tablet Commonly known as: Tylenol Take 2 tablets (650 mg total) by mouth every 8 (eight) hours as needed for mild pain.   aspirin 325 MG EC tablet Take 1 tablet (325 mg total) by mouth daily with breakfast. Notes to patient: RESTART ON 09/12/19   atorvastatin 40 MG tablet Commonly known as: LIPITOR Take 40 mg by mouth daily.   carvedilol 3.125 MG tablet Commonly known as: COREG Take 3.125 mg by mouth 2 (two) times daily with a meal.   diazepam 5 MG tablet Commonly known as: VALIUM Take 5 mg by mouth every 12 (twelve) hours as needed for anxiety.   donepezil 10 MG tablet Commonly known as: ARICEPT Take 10 mg by mouth daily.   doxepin 100 MG capsule Commonly known as: SINEQUAN Take 100-200 mg by mouth See admin instructions. Take 100 mg in the morning and 200 mg at night   famotidine 40 MG tablet Commonly known as: PEPCID Take 40 mg by mouth daily.   gemfibrozil 600 MG tablet Commonly known as: LOPID Take 600 mg by mouth 2 (two) times daily before a meal.   HYDROcodone-acetaminophen 5-325 MG tablet Commonly known as: Norco Take 1 tablet by mouth every 6 (six) hours as needed for up to 6 doses for moderate pain.   ibuprofen 800 MG tablet Commonly known as: ADVIL Take 1 tablet (800 mg total) by mouth every 8 (eight) hours as needed for mild pain or moderate pain.   insulin glargine 100 UNIT/ML injection Commonly known as: LANTUS Inject 1 mL (100 Units total) into the skin at bedtime.   insulin regular 100 units/mL injection Commonly known as: NOVOLIN R Inject 40 Units into the skin 3 (three) times  daily.   isosorbide dinitrate 30 MG tablet Commonly known as: ISORDIL Take 30 mg by mouth 3 (three) times daily.   magnesium hydroxide 400 MG/5ML suspension Commonly known as: MILK OF MAGNESIA Take 15 mLs by mouth at bedtime.   meclizine 25 MG tablet Commonly known as: ANTIVERT Take 25 mg by mouth 2 (two) times daily.   metFORMIN 500 MG 24 hr tablet Commonly known as: GLUCOPHAGE-XR Take 1,500 mg by mouth daily.   multivitamin with minerals Tabs tablet Take 1 tablet by mouth daily.   nitroGLYCERIN 0.4 MG SL tablet Commonly known as: NITROSTAT Place 0.4 mg under the tongue every 5 (five) minutes as needed for chest pain.   pregabalin 300 MG capsule Commonly known as: LYRICA Take 300 mg by mouth 2 (two) times daily.       Vitals:   09/11/19 0816 09/11/19 1151  BP: (!) 172/86 121/72  Pulse: 85 (!) 107  Resp:    Temp:  97.7 F (36.5 C)  SpO2:  95%    Davey Limas C Sanjeev Main

## 2019-09-11 NOTE — Discharge Instructions (Signed)
Hernia repair, Care After This sheet gives you information about how to care for yourself after your procedure. Your health care provider may also give you more specific instructions. If you have problems or questions, contact your health care provider. What can I expect after the procedure? After your procedure, it is common to have the following:  Pain in your abdomen, especially in the incision areas. You will be given medicine to control the pain.  Tiredness. This is a normal part of the recovery process. Your energy level will return to normal over the next several weeks.  Changes in your bowel movements, such as constipation or needing to go more often. Talk with your health care provider about how to manage this. Follow these instructions at home: Medicines   tylenol and advil as needed for discomfort.  Please alternate between the two every four hours as needed for pain.     Use narcotics, if prescribed, only when tylenol and motrin is not enough to control pain.  RESTART ASPIRIN ON 09/12/19   325-650mg  every 8hrs to max of 3000mg /24hrs (including the 325mg  in every norco dose) for the tylenol.     Advil up to 800mg  per dose every 8hrs as needed for pain.    PLEASE RECORD NUMBER OF PILLS TAKEN UNTIL NEXT FOLLOW UP APPT.  THIS WILL HELP DETERMINE HOW READY YOU ARE TO BE RELEASED FROM ANY ACTIVITY RESTRICTIONS  Do not drive or use heavy machinery while taking prescription pain medicine.  Do not drink alcohol while taking prescription pain medicine.  Incision care     Follow instructions from your health care provider about how to take care of your incision areas. Make sure you: ? Keep your incisions clean and dry. ? Wash your hands with soap and water before and after applying medicine to the areas, and before and after changing your bandage (dressing). If soap and water are not available, use hand sanitizer. ? Change your dressing as told by your health care  provider. ? Leave stitches (sutures), skin glue, or adhesive strips in place. These skin closures may need to stay in place for 2 weeks or longer. If adhesive strip edges start to loosen and curl up, you may trim the loose edges. Do not remove adhesive strips completely unless your health care provider tells you to do that.  Keep abdominal binder on as much as possible.  Keep staples covered with dressing.  [place] dressing should be changed every day.  Do not take baths, swim, or use a hot tub until your health care provider approves. OK TO SHOWER IN 24HRS.    Check your incision area every day for signs of infection. Check for: ? More redness, swelling, or pain. ? More fluid or blood. ? Warmth. ? Pus or a bad smell. Activity  Avoid lifting anything that is heavier than 10 lb (4.5 kg) for 2 weeks or until your health care provider says it is okay.  No pushing/pulling greater than 30lbs  You may resume normal activities as told by your health care provider. Ask your health care provider what activities are safe for you.  Take rest breaks during the day as needed. Eating and drinking  Follow instructions from your health care provider about what you can eat after surgery.  To prevent or treat constipation while you are taking prescription pain medicine, your health care provider may recommend that you: ? Drink enough fluid to keep your urine clear or pale yellow. ? Take over-the-counter or  prescription medicines. ? Eat foods that are high in fiber, such as fresh fruits and vegetables, whole grains, and beans. ? Limit foods that are high in fat and processed sugars, such as fried and sweet foods. General instructions  Ask your health care provider when you will need an appointment to get your sutures or staples removed.  Keep all follow-up visits as told by your health care provider. This is important. Contact a health care provider if:  You have more redness, swelling, or pain  around your incisions.  You have more fluid or blood coming from the incisions.  Your incisions feel warm to the touch.  You have pus or a bad smell coming from your incisions or your dressing.  You have a fever.  You have an incision that breaks open (edges not staying together) after sutures or staples have been removed. Get help right away if:  You develop a rash.  You have chest pain or difficulty breathing.  You have pain or swelling in your legs.  You feel light-headed or you faint.  Your abdomen swells (becomes distended).  You have nausea or vomiting.  You have blood in your stool (feces). This information is not intended to replace advice given to you by your health care provider. Make sure you discuss any questions you have with your health care provider. Document Released: 08/25/2004 Document Revised: 10/25/2017 Document Reviewed: 11/07/2015 Elsevier Interactive Patient Education  2019 [employer].

## 2019-09-19 ENCOUNTER — Encounter: Payer: Self-pay | Admitting: Emergency Medicine

## 2019-09-19 ENCOUNTER — Emergency Department
Admission: EM | Admit: 2019-09-19 | Discharge: 2019-09-19 | Disposition: A | Discharge status: Home / Self Care | Payer: Medicare HMO | Attending: Emergency Medicine | Admitting: Emergency Medicine

## 2019-09-19 ENCOUNTER — Other Ambulatory Visit: Payer: Self-pay

## 2019-09-19 DIAGNOSIS — Z794 Long term (current) use of insulin: Secondary | ICD-10-CM | POA: Diagnosis not present

## 2019-09-19 DIAGNOSIS — Z4801 Encounter for change or removal of surgical wound dressing: Secondary | ICD-10-CM | POA: Diagnosis not present

## 2019-09-19 DIAGNOSIS — Z7982 Long term (current) use of aspirin: Secondary | ICD-10-CM | POA: Insufficient documentation

## 2019-09-19 DIAGNOSIS — I2510 Atherosclerotic heart disease of native coronary artery without angina pectoris: Secondary | ICD-10-CM | POA: Insufficient documentation

## 2019-09-19 DIAGNOSIS — Y939 Activity, unspecified: Secondary | ICD-10-CM | POA: Insufficient documentation

## 2019-09-19 DIAGNOSIS — Y733 Surgical instruments, materials and gastroenterology and urology devices (including sutures) associated with adverse incidents: Secondary | ICD-10-CM | POA: Diagnosis not present

## 2019-09-19 DIAGNOSIS — Z5189 Encounter for other specified aftercare: Secondary | ICD-10-CM

## 2019-09-19 DIAGNOSIS — Y92133 Barracks on military base as the place of occurrence of the external cause: Secondary | ICD-10-CM | POA: Diagnosis not present

## 2019-09-19 DIAGNOSIS — Z79899 Other long term (current) drug therapy: Secondary | ICD-10-CM | POA: Diagnosis not present

## 2019-09-19 DIAGNOSIS — Z4800 Encounter for change or removal of nonsurgical wound dressing: Secondary | ICD-10-CM | POA: Diagnosis not present

## 2019-09-19 DIAGNOSIS — T819XXA Unspecified complication of procedure, initial encounter: Secondary | ICD-10-CM | POA: Diagnosis present

## 2019-09-19 DIAGNOSIS — I5022 Chronic systolic (congestive) heart failure: Secondary | ICD-10-CM | POA: Diagnosis not present

## 2019-09-19 DIAGNOSIS — Y999 Unspecified external cause status: Secondary | ICD-10-CM | POA: Insufficient documentation

## 2019-09-19 DIAGNOSIS — I130 Hypertensive heart and chronic kidney disease with heart failure and stage 1 through stage 4 chronic kidney disease, or unspecified chronic kidney disease: Secondary | ICD-10-CM | POA: Insufficient documentation

## 2019-09-19 DIAGNOSIS — X58XXXA Exposure to other specified factors, initial encounter: Secondary | ICD-10-CM | POA: Insufficient documentation

## 2019-09-19 DIAGNOSIS — Z87891 Personal history of nicotine dependence: Secondary | ICD-10-CM | POA: Diagnosis not present

## 2019-09-19 DIAGNOSIS — S301XXA Contusion of abdominal wall, initial encounter: Secondary | ICD-10-CM | POA: Insufficient documentation

## 2019-09-19 DIAGNOSIS — Z96642 Presence of left artificial hip joint: Secondary | ICD-10-CM | POA: Diagnosis not present

## 2019-09-19 DIAGNOSIS — E1140 Type 2 diabetes mellitus with diabetic neuropathy, unspecified: Secondary | ICD-10-CM | POA: Insufficient documentation

## 2019-09-19 DIAGNOSIS — N183 Chronic kidney disease, stage 3 unspecified: Secondary | ICD-10-CM | POA: Diagnosis not present

## 2019-09-19 DIAGNOSIS — E119 Type 2 diabetes mellitus without complications: Secondary | ICD-10-CM | POA: Insufficient documentation

## 2019-09-19 DIAGNOSIS — Z9861 Coronary angioplasty status: Secondary | ICD-10-CM | POA: Diagnosis not present

## 2019-09-19 LAB — CBC WITH DIFFERENTIAL/PLATELET
Abs Immature Granulocytes: 0.08 10*3/uL — ABNORMAL HIGH (ref 0.00–0.07)
Basophils Absolute: 0.1 10*3/uL (ref 0.0–0.1)
Basophils Relative: 1 %
Eosinophils Absolute: 0.4 10*3/uL (ref 0.0–0.5)
Eosinophils Relative: 6 %
HCT: 39.5 % (ref 39.0–52.0)
Hemoglobin: 13.1 g/dL (ref 13.0–17.0)
Immature Granulocytes: 1 %
Lymphocytes Relative: 20 %
Lymphs Abs: 1.2 10*3/uL (ref 0.7–4.0)
MCH: 29 pg (ref 26.0–34.0)
MCHC: 33.2 g/dL (ref 30.0–36.0)
MCV: 87.6 fL (ref 80.0–100.0)
Monocytes Absolute: 0.8 10*3/uL (ref 0.1–1.0)
Monocytes Relative: 13 %
Neutro Abs: 3.6 10*3/uL (ref 1.7–7.7)
Neutrophils Relative %: 59 %
Platelets: 140 10*3/uL — ABNORMAL LOW (ref 150–400)
RBC: 4.51 MIL/uL (ref 4.22–5.81)
RDW: 14.2 % (ref 11.5–15.5)
WBC: 6.1 10*3/uL (ref 4.0–10.5)
nRBC: 0 % (ref 0.0–0.2)

## 2019-09-19 LAB — COMPREHENSIVE METABOLIC PANEL
ALT: 42 U/L (ref 0–44)
AST: 41 U/L (ref 15–41)
Albumin: 4 g/dL (ref 3.5–5.0)
Alkaline Phosphatase: 52 U/L (ref 38–126)
Anion gap: 11 (ref 5–15)
BUN: 21 mg/dL (ref 8–23)
CO2: 25 mmol/L (ref 22–32)
Calcium: 9.3 mg/dL (ref 8.9–10.3)
Chloride: 103 mmol/L (ref 98–111)
Creatinine, Ser: 1.36 mg/dL — ABNORMAL HIGH (ref 0.61–1.24)
GFR calc Af Amer: >60 mL/min (ref >60)
GFR calc non Af Amer: 52 mL/min — ABNORMAL LOW (ref >60)
Glucose, Bld: 214 mg/dL — ABNORMAL HIGH (ref 70–99)
Potassium: 4.6 mmol/L (ref 3.5–5.1)
Sodium: 139 mmol/L (ref 135–145)
Total Bilirubin: 0.8 mg/dL (ref 0.3–1.2)
Total Protein: 7.6 g/dL (ref 6.5–8.1)

## 2019-09-19 LAB — GLUCOSE, CAPILLARY: Glucose-Capillary: 183 mg/dL — ABNORMAL HIGH (ref 70–99)

## 2019-09-19 MED ORDER — CEPHALEXIN 500 MG PO CAPS: 500.0000 mg | ORAL_CAPSULE | Freq: Four times a day (QID) | ORAL | 0 refills | Status: AC
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
## 2019-09-19 NOTE — Discharge Instructions (Signed)
You were seen in the ED because of the bleeding from your abdominal incision wound.  This was likely a hematoma, or a small sac of blood that developed after the surgery, and it just popped this morning and came out of the wound.  There is no evidence of continued bleeding from within your abdomen.  You are being discharged with a prescription for Keflex antibiotics to take 4 times daily for the next 7 days.  Please finish all 28 pills, even if your incision is looking better.  This is to prevent further skin infection.  Gently clean the area with warm soap and water.   Follow-up with your surgeon in the clinic as scheduled.  Return to the ED with any fevers or worsening/continued bleeding.

## 2019-09-19 NOTE — ED Notes (Signed)
EDP and Dr. Celine Ahr at bedside.   Pants and underwear noted to have blood to them from incision site.

## 2019-09-19 NOTE — ED Triage Notes (Signed)
Pt presents to ED via POV with c/o post -op problem. Pt with noted staples to midline lower abdomen. Pt admitted on 7/22 for same. Pt states this morning was going to the bathroom and "sprung a leak". No obvious bleeding noted at this time, staples appear to be intact.
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
## 2019-09-19 NOTE — ED Notes (Signed)
Repeat VS obtained by this RN. Pt visualized in NAD at this time. Pt continues to sit in wheelchair at this time. This RN apologized for and explained delay.

## 2019-10-12 DIAGNOSIS — E118 Type 2 diabetes mellitus with unspecified complications: Secondary | ICD-10-CM | POA: Diagnosis not present

## 2019-10-12 DIAGNOSIS — I10 Essential (primary) hypertension: Secondary | ICD-10-CM | POA: Diagnosis not present

## 2019-10-12 DIAGNOSIS — E875 Hyperkalemia: Secondary | ICD-10-CM | POA: Diagnosis not present

## 2019-10-12 DIAGNOSIS — N1831 Chronic kidney disease, stage 3a: Secondary | ICD-10-CM | POA: Diagnosis not present

## 2019-10-14 DIAGNOSIS — E113293 Type 2 diabetes mellitus with mild nonproliferative diabetic retinopathy without macular edema, bilateral: Secondary | ICD-10-CM | POA: Diagnosis not present

## 2019-10-14 DIAGNOSIS — Z794 Long term (current) use of insulin: Secondary | ICD-10-CM | POA: Diagnosis not present

## 2019-10-19 DIAGNOSIS — H34831 Tributary (branch) retinal vein occlusion, right eye, with macular edema: Secondary | ICD-10-CM | POA: Diagnosis not present

## 2019-10-21 DIAGNOSIS — E1142 Type 2 diabetes mellitus with diabetic polyneuropathy: Secondary | ICD-10-CM | POA: Diagnosis not present

## 2019-10-21 DIAGNOSIS — E113293 Type 2 diabetes mellitus with mild nonproliferative diabetic retinopathy without macular edema, bilateral: Secondary | ICD-10-CM | POA: Diagnosis not present

## 2019-10-21 DIAGNOSIS — E782 Mixed hyperlipidemia: Secondary | ICD-10-CM | POA: Diagnosis not present

## 2019-10-21 DIAGNOSIS — N1831 Chronic kidney disease, stage 3a: Secondary | ICD-10-CM | POA: Diagnosis not present

## 2019-10-21 DIAGNOSIS — E1122 Type 2 diabetes mellitus with diabetic chronic kidney disease: Secondary | ICD-10-CM | POA: Diagnosis not present

## 2019-10-21 DIAGNOSIS — E1159 Type 2 diabetes mellitus with other circulatory complications: Secondary | ICD-10-CM | POA: Diagnosis not present

## 2019-10-21 DIAGNOSIS — E1165 Type 2 diabetes mellitus with hyperglycemia: Secondary | ICD-10-CM | POA: Diagnosis not present

## 2019-10-21 DIAGNOSIS — Z794 Long term (current) use of insulin: Secondary | ICD-10-CM | POA: Diagnosis not present

## 2019-10-30 DIAGNOSIS — G4733 Obstructive sleep apnea (adult) (pediatric): Secondary | ICD-10-CM | POA: Diagnosis not present

## 2019-11-13 DIAGNOSIS — I255 Ischemic cardiomyopathy: Secondary | ICD-10-CM | POA: Diagnosis not present

## 2019-11-13 DIAGNOSIS — E1159 Type 2 diabetes mellitus with other circulatory complications: Secondary | ICD-10-CM | POA: Diagnosis not present

## 2019-11-13 DIAGNOSIS — R001 Bradycardia, unspecified: Secondary | ICD-10-CM | POA: Diagnosis not present

## 2019-11-17 DIAGNOSIS — N1831 Chronic kidney disease, stage 3a: Secondary | ICD-10-CM | POA: Diagnosis not present

## 2019-11-17 DIAGNOSIS — E875 Hyperkalemia: Secondary | ICD-10-CM | POA: Diagnosis not present

## 2019-11-17 DIAGNOSIS — I10 Essential (primary) hypertension: Secondary | ICD-10-CM | POA: Diagnosis not present

## 2019-11-17 DIAGNOSIS — E118 Type 2 diabetes mellitus with unspecified complications: Secondary | ICD-10-CM | POA: Diagnosis not present

## 2019-12-14 DIAGNOSIS — H34831 Tributary (branch) retinal vein occlusion, right eye, with macular edema: Secondary | ICD-10-CM | POA: Diagnosis not present

## 2020-01-12 ENCOUNTER — Other Ambulatory Visit: Payer: Self-pay | Admitting: Surgery

## 2020-01-12 DIAGNOSIS — T8130XA Disruption of wound, unspecified, initial encounter: Secondary | ICD-10-CM

## 2020-01-13 DIAGNOSIS — T8130XA Disruption of wound, unspecified, initial encounter: Secondary | ICD-10-CM | POA: Diagnosis not present

## 2020-01-20 DIAGNOSIS — Z794 Long term (current) use of insulin: Secondary | ICD-10-CM | POA: Diagnosis not present

## 2020-01-20 DIAGNOSIS — T8130XA Disruption of wound, unspecified, initial encounter: Secondary | ICD-10-CM | POA: Diagnosis not present

## 2020-01-20 DIAGNOSIS — N1831 Chronic kidney disease, stage 3a: Secondary | ICD-10-CM | POA: Diagnosis not present

## 2020-01-20 DIAGNOSIS — E1122 Type 2 diabetes mellitus with diabetic chronic kidney disease: Secondary | ICD-10-CM | POA: Diagnosis not present

## 2020-01-22 DIAGNOSIS — Z0100 Encounter for examination of eyes and vision without abnormal findings: Secondary | ICD-10-CM | POA: Diagnosis not present

## 2020-01-25 DIAGNOSIS — N1831 Chronic kidney disease, stage 3a: Secondary | ICD-10-CM | POA: Diagnosis not present

## 2020-01-25 DIAGNOSIS — Z794 Long term (current) use of insulin: Secondary | ICD-10-CM | POA: Diagnosis not present

## 2020-01-25 DIAGNOSIS — E113293 Type 2 diabetes mellitus with mild nonproliferative diabetic retinopathy without macular edema, bilateral: Secondary | ICD-10-CM | POA: Diagnosis not present

## 2020-01-25 DIAGNOSIS — E1142 Type 2 diabetes mellitus with diabetic polyneuropathy: Secondary | ICD-10-CM | POA: Diagnosis not present

## 2020-01-25 DIAGNOSIS — E1159 Type 2 diabetes mellitus with other circulatory complications: Secondary | ICD-10-CM | POA: Diagnosis not present

## 2020-01-25 DIAGNOSIS — E1122 Type 2 diabetes mellitus with diabetic chronic kidney disease: Secondary | ICD-10-CM | POA: Diagnosis not present

## 2020-01-26 ENCOUNTER — Other Ambulatory Visit: Payer: Self-pay | Admitting: Orthopedic Surgery

## 2020-01-27 ENCOUNTER — Other Ambulatory Visit: Payer: Self-pay

## 2020-01-27 ENCOUNTER — Ambulatory Visit
Admission: RE | Admit: 2020-01-27 | Discharge: 2020-01-27 | Disposition: A | Discharge status: Home / Self Care | Payer: Medicare HMO | Source: Ambulatory Visit | Attending: Surgery | Admitting: Surgery

## 2020-01-27 DIAGNOSIS — M7989 Other specified soft tissue disorders: Secondary | ICD-10-CM | POA: Diagnosis not present

## 2020-01-27 DIAGNOSIS — T8130XA Disruption of wound, unspecified, initial encounter: Secondary | ICD-10-CM | POA: Diagnosis not present

## 2020-01-27 MED ORDER — IOHEXOL 300 MG/ML  SOLN
85.0000 mL | Freq: Once | INTRAMUSCULAR | Status: AC | PRN
  Administered 2020-01-27: 85 mL via INTRAVENOUS

## 2020-02-01 ENCOUNTER — Encounter
Admission: RE | Admit: 2020-02-01 | Discharge: 2020-02-01 | Disposition: A | Discharge status: Home / Self Care | Payer: Medicare HMO | Source: Ambulatory Visit | Attending: Orthopedic Surgery | Admitting: Orthopedic Surgery

## 2020-02-01 ENCOUNTER — Other Ambulatory Visit: Payer: Self-pay

## 2020-02-01 DIAGNOSIS — Z01818 Encounter for other preprocedural examination: Secondary | ICD-10-CM | POA: Insufficient documentation

## 2020-02-01 DIAGNOSIS — H34831 Tributary (branch) retinal vein occlusion, right eye, with macular edema: Secondary | ICD-10-CM | POA: Diagnosis not present

## 2020-02-01 LAB — CBC WITH DIFFERENTIAL/PLATELET
Abs Immature Granulocytes: 0.04 10*3/uL (ref 0.00–0.07)
Basophils Absolute: 0.1 10*3/uL (ref 0.0–0.1)
Basophils Relative: 1 %
Eosinophils Absolute: 0.3 10*3/uL (ref 0.0–0.5)
Eosinophils Relative: 5 %
HCT: 39.7 % (ref 39.0–52.0)
Hemoglobin: 13.3 g/dL (ref 13.0–17.0)
Immature Granulocytes: 1 %
Lymphocytes Relative: 22 %
Lymphs Abs: 1.3 10*3/uL (ref 0.7–4.0)
MCH: 29.5 pg (ref 26.0–34.0)
MCHC: 33.5 g/dL (ref 30.0–36.0)
MCV: 88 fL (ref 80.0–100.0)
Monocytes Absolute: 0.7 10*3/uL (ref 0.1–1.0)
Monocytes Relative: 12 %
Neutro Abs: 3.6 10*3/uL (ref 1.7–7.7)
Neutrophils Relative %: 59 %
Platelets: 145 10*3/uL — ABNORMAL LOW (ref 150–400)
RBC: 4.51 MIL/uL (ref 4.22–5.81)
RDW: 14.6 % (ref 11.5–15.5)
WBC: 6 10*3/uL (ref 4.0–10.5)
nRBC: 0 % (ref 0.0–0.2)

## 2020-02-01 LAB — URINALYSIS, ROUTINE W REFLEX MICROSCOPIC
Bacteria, UA: NONE SEEN
Bilirubin Urine: NEGATIVE
Glucose, UA: NEGATIVE mg/dL
Hgb urine dipstick: NEGATIVE
Ketones, ur: NEGATIVE mg/dL
Leukocytes,Ua: NEGATIVE
Nitrite: NEGATIVE
Protein, ur: 30 mg/dL — AB
Specific Gravity, Urine: 1.024 (ref 1.005–1.030)
Squamous Epithelial / HPF: NONE SEEN (ref 0–5)
pH: 5 (ref 5.0–8.0)

## 2020-02-01 LAB — SURGICAL PCR SCREEN
MRSA, PCR: NEGATIVE
Staphylococcus aureus: NEGATIVE

## 2020-02-01 LAB — TYPE AND SCREEN
ABO/RH(D): A POS
Antibody Screen: NEGATIVE

## 2020-02-01 NOTE — Patient Instructions (Addendum)
Your procedure is scheduled on: 02/11/20 Report to [facility]. MUST STOP AT ADMITTING DESK To find out your arrival time please call [phone] between 1PM - 3PM on 02/10/20.  Remember: Instructions that are not followed completely may result in serious medical risk, up to and including death, or upon the discretion of your surgeon and anesthesiologist your surgery may need to be rescheduled.     _X__ 1. Do not eat food after midnight the night before your procedure.                 No gum chewing or hard candies. You may drink clear liquids up to 2 hours                 before you are scheduled to arrive for your surgery- Diabetics water only  __X__2.  On the morning of surgery brush your teeth with toothpaste and water, you                 may rinse your mouth with mouthwash if you wish.  Do not swallow any              toothpaste of mouthwash.     _X__ 3.  No Alcohol for 24 hours before or after surgery.   _X__ 4.  Do Not Smoke or use e-cigarettes For 24 Hours Prior to Your Surgery.                 Do not use any chewable tobacco products for at least 6 hours prior to                 surgery.  ____  5.  Bring all medications with you on the day of surgery if instructed.   __X__  6.  Notify your doctor if there is any change in your medical condition      (cold, fever, infections).     Do not wear jewelry, make-up, hairpins, clips or nail polish. Do not wear lotions, powders, or perfumes.  Do not shave 48 hours prior to surgery. Men may shave face and neck. Do not bring valuables to the hospital.    [facility] is not responsible for any belongings or valuables.  Contacts, dentures/partials or body piercings may not be worn into surgery. Bring a case for your contacts, glasses or hearing aids, a denture cup will be supplied. Leave your suitcase in the car. After surgery it may be brought to your room. For patients admitted  to the hospital, discharge time is determined by your treatment team.   Patients discharged the day of surgery will not be allowed to drive home.   Please read over the following fact sheets that you were given:   MRSA Information  __X__ Take these medicines the morning of surgery with A SIP OF WATER:    1. atorvastatin (LIPITOR) 40 MG tablet  2. donepezil (ARICEPT) 10 MG tablet  3. doxepin (SINEQUAN) 100 MG capsule  4. pregabalin (LYRICA) 300 MG capsule  5.  6.  ____ Fleet Enema (as directed)   __X__ Use CHG Soap/SAGE wipes as directed  ____ Use inhalers on the day of surgery  __X__ Stop metformin/Janumet/Farxiga 2 days prior to surgery  LAST DOSE 02/08/20  __X__ Take 1/2 of usual insulin dose the night before surgery. No insulin the morning          of surgery. LANTUS CUT IN HALF TO 50  UNITS   ____ Stop Blood Thinners Coumadin/Plavix/Xarelto/Pleta/Pradaxa/Eliquis/Effient/Aspirin  on   Or contact your Surgeon, Cardiologist or Medical Doctor regarding  ability to stop your blood thinners  __X__ Stop Anti-inflammatories 7 days before surgery such as Advil, Ibuprofen, Motrin,  BC or Goodies Powder, Naprosyn, Naproxen, Aleve, Aspirin    YOU MAY USE TYLENOL IF NEEDED  __X__ Stop all herbal supplements, fish oil or vitamin E until after surgery.    __X__ Bring C-Pap to the hospital.       How to Use an Incentive Spirometer An incentive spirometer is a tool that measures how well you are filling your lungs with each breath. Learning to take long, deep breaths using this tool can help you keep your lungs clear and active. This may help to reverse or lessen your chance of developing breathing (pulmonary) problems, especially infection. You may be asked to use a spirometer:  After a surgery.  If you have a lung problem or a history of smoking.  After a long period of time when you have been unable to move or be active. If the spirometer includes an indicator to show the  highest number that you have reached, your health care provider or respiratory therapist will help you set a goal. Keep a list (log) of your progress as told by your health care provider. What are the risks?  Breathing too quickly may cause dizziness or cause you to pass out. Take your time so you do not get dizzy or light-headed.  If you are in pain, you may need to take pain medicine before doing incentive spirometry. It is harder to take a deep breath if you are having pain. How to use your incentive spirometer  1. Sit up on the edge of your bed or on a chair. 2. Hold the incentive spirometer so that it is in an upright position. 3. Before you use the spirometer, breathe out normally. 4. Place the mouthpiece in your mouth. Make sure your lips are closed tightly around it. 5. Breathe in slowly and as deeply as you can through your mouth, causing the piston or the ball to rise toward the top of the chamber. 6. Hold your breath for 3-5 seconds, or for as long as possible. ? If the spirometer includes a coach indicator, use this to guide you in breathing. Slow down your breathing if the indicator goes above the marked areas. 7. Remove the mouthpiece from your mouth and breathe out normally. The piston or ball will return to the bottom of the chamber. 8. Rest for a few seconds, then repeat the steps 10 or more times. ? Take your time and take a few normal breaths between deep breaths so that you do not get dizzy or light-headed. ? Do this every 1-2 hours when you are awake. 9. If the spirometer includes a goal marker to show the highest number you have reached (best effort), use this as a goal to work toward during each repetition. 10. After each set of 10 deep breaths, cough a few times. This will help to make sure that your lungs are clear. ? If you have an incision on your chest or abdomen from surgery, place a pillow or a rolled-up towel firmly against the incision when you cough. This can  help to reduce pain from coughing. General tips  When you become able to get out of bed, walk around often and continue to cough to help clear your lungs.  Keep using the incentive spirometer  until your health care provider says it is okay to stop using it. If you have been in the hospital, you may be told to keep using the spirometer at home. Contact a health care provider if:  You are having difficulty using the spirometer.  You have trouble using the spirometer as often as instructed.  Your pain medicine is not giving enough relief for you to use the spirometer as told.  You have a fever.  You develop shortness of breath. Get help right away if:  You develop a cough with bloody mucus from the lungs (bloody sputum).  You have fluid or blood coming from an incision site after you cough. Summary  An incentive spirometer is a tool that can help you learn to take long, deep breaths to keep your lungs clear and active.  You may be asked to use a spirometer after a surgery, if you have a lung problem or a history of smoking, or if you have been inactive for a long period of time.  Use your incentive spirometer as instructed every 1-2 hours while you are awake.  If you have an incision on your chest or abdomen, place a pillow or a rolled-up towel firmly against your incision when you cough. This will help to reduce pain. This information is not intended to replace advice given to you by your health care provider. Make sure you discuss any questions you have with your health care provider. Document Revised: 09/05/2018 Document Reviewed: 12/19/2016 Elsevier Patient Education  2020 [employer].

## 2020-02-03 ENCOUNTER — Encounter: Payer: Self-pay | Admitting: Orthopedic Surgery
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
## 2020-02-09 ENCOUNTER — Other Ambulatory Visit: Payer: Self-pay

## 2020-02-09 ENCOUNTER — Other Ambulatory Visit
Admission: RE | Admit: 2020-02-09 | Discharge: 2020-02-09 | Disposition: A | Discharge status: Home / Self Care | Payer: Medicare HMO | Source: Ambulatory Visit | Attending: Orthopedic Surgery | Admitting: Orthopedic Surgery

## 2020-02-09 DIAGNOSIS — I130 Hypertensive heart and chronic kidney disease with heart failure and stage 1 through stage 4 chronic kidney disease, or unspecified chronic kidney disease: Secondary | ICD-10-CM | POA: Diagnosis present

## 2020-02-09 DIAGNOSIS — Z794 Long term (current) use of insulin: Secondary | ICD-10-CM | POA: Diagnosis not present

## 2020-02-09 DIAGNOSIS — Z01812 Encounter for preprocedural laboratory examination: Secondary | ICD-10-CM | POA: Insufficient documentation

## 2020-02-09 DIAGNOSIS — I159 Secondary hypertension, unspecified: Secondary | ICD-10-CM | POA: Diagnosis not present

## 2020-02-09 DIAGNOSIS — E1140 Type 2 diabetes mellitus with diabetic neuropathy, unspecified: Secondary | ICD-10-CM | POA: Diagnosis not present

## 2020-02-09 DIAGNOSIS — F0280 Dementia in other diseases classified elsewhere without behavioral disturbance: Secondary | ICD-10-CM | POA: Diagnosis present

## 2020-02-09 DIAGNOSIS — Z20822 Contact with and (suspected) exposure to covid-19: Secondary | ICD-10-CM | POA: Insufficient documentation

## 2020-02-09 DIAGNOSIS — H9190 Unspecified hearing loss, unspecified ear: Secondary | ICD-10-CM | POA: Diagnosis present

## 2020-02-09 DIAGNOSIS — Z7401 Bed confinement status: Secondary | ICD-10-CM | POA: Diagnosis not present

## 2020-02-09 DIAGNOSIS — Z87891 Personal history of nicotine dependence: Secondary | ICD-10-CM | POA: Diagnosis not present

## 2020-02-09 DIAGNOSIS — I252 Old myocardial infarction: Secondary | ICD-10-CM | POA: Diagnosis not present

## 2020-02-09 DIAGNOSIS — T8130XA Disruption of wound, unspecified, initial encounter: Secondary | ICD-10-CM | POA: Diagnosis present

## 2020-02-09 DIAGNOSIS — Y838 Other surgical procedures as the cause of abnormal reaction of the patient, or of later complication, without mention of misadventure at the time of the procedure: Secondary | ICD-10-CM | POA: Diagnosis present

## 2020-02-09 DIAGNOSIS — Z471 Aftercare following joint replacement surgery: Secondary | ICD-10-CM | POA: Diagnosis not present

## 2020-02-09 DIAGNOSIS — G4730 Sleep apnea, unspecified: Secondary | ICD-10-CM | POA: Diagnosis present

## 2020-02-09 DIAGNOSIS — Z8249 Family history of ischemic heart disease and other diseases of the circulatory system: Secondary | ICD-10-CM | POA: Diagnosis not present

## 2020-02-09 DIAGNOSIS — E1122 Type 2 diabetes mellitus with diabetic chronic kidney disease: Secondary | ICD-10-CM | POA: Diagnosis present

## 2020-02-09 DIAGNOSIS — Z9842 Cataract extraction status, left eye: Secondary | ICD-10-CM | POA: Diagnosis not present

## 2020-02-09 DIAGNOSIS — R279 Unspecified lack of coordination: Secondary | ICD-10-CM | POA: Diagnosis not present

## 2020-02-09 DIAGNOSIS — E785 Hyperlipidemia, unspecified: Secondary | ICD-10-CM | POA: Diagnosis present

## 2020-02-09 DIAGNOSIS — E1142 Type 2 diabetes mellitus with diabetic polyneuropathy: Secondary | ICD-10-CM | POA: Diagnosis present

## 2020-02-09 DIAGNOSIS — I2510 Atherosclerotic heart disease of native coronary artery without angina pectoris: Secondary | ICD-10-CM | POA: Diagnosis present

## 2020-02-09 DIAGNOSIS — R079 Chest pain, unspecified: Secondary | ICD-10-CM | POA: Diagnosis not present

## 2020-02-09 DIAGNOSIS — Z7982 Long term (current) use of aspirin: Secondary | ICD-10-CM | POA: Diagnosis not present

## 2020-02-09 DIAGNOSIS — M25561 Pain in right knee: Secondary | ICD-10-CM | POA: Diagnosis present

## 2020-02-09 DIAGNOSIS — I255 Ischemic cardiomyopathy: Secondary | ICD-10-CM | POA: Diagnosis present

## 2020-02-09 DIAGNOSIS — N182 Chronic kidney disease, stage 2 (mild): Secondary | ICD-10-CM | POA: Diagnosis present

## 2020-02-09 DIAGNOSIS — I5022 Chronic systolic (congestive) heart failure: Secondary | ICD-10-CM | POA: Diagnosis not present

## 2020-02-09 DIAGNOSIS — Z9049 Acquired absence of other specified parts of digestive tract: Secondary | ICD-10-CM | POA: Diagnosis not present

## 2020-02-09 DIAGNOSIS — R52 Pain, unspecified: Secondary | ICD-10-CM | POA: Diagnosis not present

## 2020-02-09 DIAGNOSIS — R001 Bradycardia, unspecified: Secondary | ICD-10-CM | POA: Diagnosis not present

## 2020-02-09 DIAGNOSIS — N183 Chronic kidney disease, stage 3 unspecified: Secondary | ICD-10-CM | POA: Diagnosis not present

## 2020-02-09 DIAGNOSIS — M1711 Unilateral primary osteoarthritis, right knee: Secondary | ICD-10-CM | POA: Diagnosis present

## 2020-02-09 DIAGNOSIS — I491 Atrial premature depolarization: Secondary | ICD-10-CM | POA: Diagnosis not present

## 2020-02-09 DIAGNOSIS — F0150 Vascular dementia without behavioral disturbance: Secondary | ICD-10-CM | POA: Diagnosis present

## 2020-02-09 DIAGNOSIS — N179 Acute kidney failure, unspecified: Secondary | ICD-10-CM | POA: Diagnosis not present

## 2020-02-09 DIAGNOSIS — Z96652 Presence of left artificial knee joint: Secondary | ICD-10-CM | POA: Diagnosis present

## 2020-02-09 DIAGNOSIS — I471 Supraventricular tachycardia: Secondary | ICD-10-CM | POA: Diagnosis not present

## 2020-02-09 DIAGNOSIS — Z96651 Presence of right artificial knee joint: Secondary | ICD-10-CM | POA: Diagnosis not present

## 2020-02-09 DIAGNOSIS — G309 Alzheimer's disease, unspecified: Secondary | ICD-10-CM | POA: Diagnosis present

## 2020-02-09 DIAGNOSIS — J90 Pleural effusion, not elsewhere classified: Secondary | ICD-10-CM | POA: Diagnosis not present

## 2020-02-09 DIAGNOSIS — R109 Unspecified abdominal pain: Secondary | ICD-10-CM | POA: Diagnosis not present

## 2020-02-09 LAB — SARS CORONAVIRUS 2 (TAT 6-24 HRS): SARS Coronavirus 2: NEGATIVE

## 2020-02-11 ENCOUNTER — Inpatient Hospital Stay: Payer: Medicare HMO

## 2020-02-11 ENCOUNTER — Inpatient Hospital Stay: Payer: Medicare HMO | Admitting: Urgent Care

## 2020-02-11 ENCOUNTER — Encounter: Payer: Self-pay | Admitting: Orthopedic Surgery

## 2020-02-11 ENCOUNTER — Other Ambulatory Visit: Payer: Self-pay

## 2020-02-11 ENCOUNTER — Inpatient Hospital Stay
Admission: RE | Admit: 2020-02-11 | Discharge: 2020-02-17 | DRG: 470 | Disposition: A | Discharge status: Skilled Nursing Facility | Payer: Medicare HMO | Attending: Orthopedic Surgery | Admitting: Orthopedic Surgery

## 2020-02-11 ENCOUNTER — Encounter
Admission: RE | Disposition: A | Discharge status: Skilled Nursing Facility | Payer: Self-pay | Source: Home / Self Care | Attending: Orthopedic Surgery

## 2020-02-11 DIAGNOSIS — Z9842 Cataract extraction status, left eye: Secondary | ICD-10-CM | POA: Diagnosis not present

## 2020-02-11 DIAGNOSIS — R5383 Other fatigue: Secondary | ICD-10-CM

## 2020-02-11 DIAGNOSIS — Z87891 Personal history of nicotine dependence: Secondary | ICD-10-CM

## 2020-02-11 DIAGNOSIS — I2510 Atherosclerotic heart disease of native coronary artery without angina pectoris: Secondary | ICD-10-CM | POA: Diagnosis present

## 2020-02-11 DIAGNOSIS — I5022 Chronic systolic (congestive) heart failure: Secondary | ICD-10-CM | POA: Diagnosis present

## 2020-02-11 DIAGNOSIS — E785 Hyperlipidemia, unspecified: Secondary | ICD-10-CM | POA: Diagnosis present

## 2020-02-11 DIAGNOSIS — Z6831 Body mass index (BMI) 31.0-31.9, adult: Secondary | ICD-10-CM

## 2020-02-11 DIAGNOSIS — I159 Secondary hypertension, unspecified: Secondary | ICD-10-CM | POA: Diagnosis not present

## 2020-02-11 DIAGNOSIS — N179 Acute kidney failure, unspecified: Secondary | ICD-10-CM | POA: Diagnosis present

## 2020-02-11 DIAGNOSIS — E1122 Type 2 diabetes mellitus with diabetic chronic kidney disease: Secondary | ICD-10-CM | POA: Diagnosis present

## 2020-02-11 DIAGNOSIS — I130 Hypertensive heart and chronic kidney disease with heart failure and stage 1 through stage 4 chronic kidney disease, or unspecified chronic kidney disease: Secondary | ICD-10-CM | POA: Diagnosis present

## 2020-02-11 DIAGNOSIS — N183 Chronic kidney disease, stage 3 unspecified: Secondary | ICD-10-CM | POA: Diagnosis present

## 2020-02-11 DIAGNOSIS — T8130XA Disruption of wound, unspecified, initial encounter: Secondary | ICD-10-CM | POA: Diagnosis present

## 2020-02-11 DIAGNOSIS — Z96652 Presence of left artificial knee joint: Secondary | ICD-10-CM | POA: Diagnosis present

## 2020-02-11 DIAGNOSIS — F0150 Vascular dementia without behavioral disturbance: Secondary | ICD-10-CM | POA: Diagnosis present

## 2020-02-11 DIAGNOSIS — F0280 Dementia in other diseases classified elsewhere without behavioral disturbance: Secondary | ICD-10-CM | POA: Diagnosis present

## 2020-02-11 DIAGNOSIS — G309 Alzheimer's disease, unspecified: Secondary | ICD-10-CM | POA: Diagnosis present

## 2020-02-11 DIAGNOSIS — Z8249 Family history of ischemic heart disease and other diseases of the circulatory system: Secondary | ICD-10-CM

## 2020-02-11 DIAGNOSIS — M1711 Unilateral primary osteoarthritis, right knee: Secondary | ICD-10-CM | POA: Diagnosis present

## 2020-02-11 DIAGNOSIS — H9190 Unspecified hearing loss, unspecified ear: Secondary | ICD-10-CM | POA: Diagnosis present

## 2020-02-11 DIAGNOSIS — Z833 Family history of diabetes mellitus: Secondary | ICD-10-CM

## 2020-02-11 DIAGNOSIS — Z961 Presence of intraocular lens: Secondary | ICD-10-CM | POA: Diagnosis present

## 2020-02-11 DIAGNOSIS — Z20822 Contact with and (suspected) exposure to covid-19: Secondary | ICD-10-CM | POA: Diagnosis present

## 2020-02-11 DIAGNOSIS — I10 Essential (primary) hypertension: Secondary | ICD-10-CM | POA: Diagnosis present

## 2020-02-11 DIAGNOSIS — Y838 Other surgical procedures as the cause of abnormal reaction of the patient, or of later complication, without mention of misadventure at the time of the procedure: Secondary | ICD-10-CM | POA: Diagnosis present

## 2020-02-11 DIAGNOSIS — R109 Unspecified abdominal pain: Secondary | ICD-10-CM

## 2020-02-11 DIAGNOSIS — M25561 Pain in right knee: Secondary | ICD-10-CM | POA: Diagnosis present

## 2020-02-11 DIAGNOSIS — Z79899 Other long term (current) drug therapy: Secondary | ICD-10-CM

## 2020-02-11 DIAGNOSIS — I252 Old myocardial infarction: Secondary | ICD-10-CM

## 2020-02-11 DIAGNOSIS — N182 Chronic kidney disease, stage 2 (mild): Secondary | ICD-10-CM | POA: Diagnosis present

## 2020-02-11 DIAGNOSIS — E1142 Type 2 diabetes mellitus with diabetic polyneuropathy: Secondary | ICD-10-CM | POA: Diagnosis present

## 2020-02-11 DIAGNOSIS — R001 Bradycardia, unspecified: Secondary | ICD-10-CM | POA: Diagnosis not present

## 2020-02-11 DIAGNOSIS — Z794 Long term (current) use of insulin: Secondary | ICD-10-CM

## 2020-02-11 DIAGNOSIS — Z9841 Cataract extraction status, right eye: Secondary | ICD-10-CM

## 2020-02-11 DIAGNOSIS — G4730 Sleep apnea, unspecified: Secondary | ICD-10-CM | POA: Diagnosis present

## 2020-02-11 DIAGNOSIS — I471 Supraventricular tachycardia: Secondary | ICD-10-CM | POA: Diagnosis not present

## 2020-02-11 DIAGNOSIS — I255 Ischemic cardiomyopathy: Secondary | ICD-10-CM | POA: Diagnosis present

## 2020-02-11 DIAGNOSIS — Z96651 Presence of right artificial knee joint: Secondary | ICD-10-CM

## 2020-02-11 DIAGNOSIS — E869 Volume depletion, unspecified: Secondary | ICD-10-CM | POA: Diagnosis not present

## 2020-02-11 DIAGNOSIS — Z955 Presence of coronary angioplasty implant and graft: Secondary | ICD-10-CM

## 2020-02-11 DIAGNOSIS — I491 Atrial premature depolarization: Secondary | ICD-10-CM | POA: Diagnosis not present

## 2020-02-11 DIAGNOSIS — Z7982 Long term (current) use of aspirin: Secondary | ICD-10-CM

## 2020-02-11 DIAGNOSIS — E669 Obesity, unspecified: Secondary | ICD-10-CM | POA: Diagnosis present

## 2020-02-11 DIAGNOSIS — E119 Type 2 diabetes mellitus without complications: Secondary | ICD-10-CM

## 2020-02-11 DIAGNOSIS — Z9049 Acquired absence of other specified parts of digestive tract: Secondary | ICD-10-CM | POA: Diagnosis not present

## 2020-02-11 DIAGNOSIS — L299 Pruritus, unspecified: Secondary | ICD-10-CM | POA: Diagnosis not present

## 2020-02-11 DIAGNOSIS — G8918 Other acute postprocedural pain: Secondary | ICD-10-CM

## 2020-02-11 HISTORY — DX: Other forms of dyspnea: R06.09

## 2020-02-11 HISTORY — PX: TOTAL KNEE ARTHROPLASTY: SHX125

## 2020-02-11 HISTORY — DX: Dementia in other diseases classified elsewhere, unspecified severity, without behavioral disturbance, psychotic disturbance, mood disturbance, and anxiety: F02.80

## 2020-02-11 HISTORY — DX: Vascular dementia, unspecified severity, without behavioral disturbance, psychotic disturbance, mood disturbance, and anxiety: F01.50

## 2020-02-11 HISTORY — DX: Dyspnea, unspecified: R06.00

## 2020-02-11 HISTORY — DX: Chronic systolic (congestive) heart failure: I50.22

## 2020-02-11 LAB — CBC
HCT: 37.2 % — ABNORMAL LOW (ref 39.0–52.0)
Hemoglobin: 12.3 g/dL — ABNORMAL LOW (ref 13.0–17.0)
MCH: 29.9 pg (ref 26.0–34.0)
MCHC: 33.1 g/dL (ref 30.0–36.0)
MCV: 90.3 fL (ref 80.0–100.0)
Platelets: 104 10*3/uL — ABNORMAL LOW (ref 150–400)
RBC: 4.12 MIL/uL — ABNORMAL LOW (ref 4.22–5.81)
RDW: 14.6 % (ref 11.5–15.5)
WBC: 7 10*3/uL (ref 4.0–10.5)
nRBC: 0 % (ref 0.0–0.2)

## 2020-02-11 LAB — GLUCOSE, CAPILLARY
Glucose-Capillary: 136 mg/dL — ABNORMAL HIGH (ref 70–99)
Glucose-Capillary: 113 mg/dL — ABNORMAL HIGH (ref 70–99)
Glucose-Capillary: 167 mg/dL — ABNORMAL HIGH (ref 70–99)
Glucose-Capillary: 72 mg/dL (ref 70–99)

## 2020-02-11 LAB — CREATININE, SERUM
Creatinine, Ser: 1.08 mg/dL (ref 0.61–1.24)
GFR, Estimated: >60 mL/min (ref >60)

## 2020-02-11 SURGERY — ARTHROPLASTY, KNEE, TOTAL
Anesthesia: Spinal | Site: Knee | Laterality: Right

## 2020-02-11 MED ORDER — METHOCARBAMOL 500 MG PO TABS: 500.0000 mg | ORAL_TABLET | Freq: Four times a day (QID) | ORAL | Status: DC | PRN

## 2020-02-11 MED ORDER — CEFAZOLIN SODIUM-DEXTROSE 2-4 GM/100ML-% IV SOLN
INTRAVENOUS | Status: AC
  Filled 2020-02-11: qty 100

## 2020-02-11 MED ORDER — ONDANSETRON HCL 4 MG PO TABS: 4.0000 mg | ORAL_TABLET | Freq: Four times a day (QID) | ORAL | Status: DC | PRN

## 2020-02-11 MED ORDER — ORAL CARE MOUTH RINSE: 15.0000 mL | Freq: Once | OROMUCOSAL | Status: AC

## 2020-02-11 MED ORDER — ONDANSETRON HCL 4 MG/2ML IJ SOLN: 4.0000 mg | Freq: Once | INTRAMUSCULAR | Status: DC | PRN

## 2020-02-11 MED ORDER — NEOMYCIN-POLYMYXIN B GU 40-200000 IR SOLN
Status: AC
  Filled 2020-02-11: qty 1

## 2020-02-11 MED ORDER — PROPOFOL 10 MG/ML IV BOLUS
INTRAVENOUS | Status: DC | PRN
  Administered 2020-02-11 (×2): 10 mg via INTRAVENOUS

## 2020-02-11 MED ORDER — OXYCODONE HCL 5 MG PO TABS
10.0000 mg | ORAL_TABLET | ORAL | Status: DC | PRN
  Administered 2020-02-11 (×2): 15 mg via ORAL
  Filled 2020-02-11 (×2): qty 3

## 2020-02-11 MED ORDER — ONDANSETRON HCL 4 MG/2ML IJ SOLN
INTRAMUSCULAR | Status: DC | PRN
  Administered 2020-02-11: 4 mg via INTRAVENOUS

## 2020-02-11 MED ORDER — DIAZEPAM 5 MG PO TABS
5.0000 mg | ORAL_TABLET | Freq: Two times a day (BID) | ORAL | Status: DC | PRN
  Administered 2020-02-12: 5 mg via ORAL
  Filled 2020-02-11: qty 1

## 2020-02-11 MED ORDER — PANTOPRAZOLE SODIUM 40 MG PO TBEC
40.0000 mg | DELAYED_RELEASE_TABLET | Freq: Every day | ORAL | Status: DC
  Administered 2020-02-11 – 2020-02-17 (×7): 40 mg via ORAL
  Filled 2020-02-11 (×7): qty 1

## 2020-02-11 MED ORDER — DEXAMETHASONE SODIUM PHOSPHATE 10 MG/ML IJ SOLN
INTRAMUSCULAR | Status: AC
  Filled 2020-02-11: qty 1

## 2020-02-11 MED ORDER — ALUM & MAG HYDROXIDE-SIMETH 200-200-20 MG/5ML PO SUSP: 30.0000 mL | ORAL | Status: DC | PRN

## 2020-02-11 MED ORDER — SODIUM CHLORIDE 0.9 % IV SOLN
INTRAVENOUS | Status: DC | PRN
  Administered 2020-02-11: 40 ug/min via INTRAVENOUS

## 2020-02-11 MED ORDER — MAGNESIUM HYDROXIDE 400 MG/5ML PO SUSP
30.0000 mL | Freq: Every day | ORAL | Status: DC | PRN
  Administered 2020-02-14: 30 mL via ORAL
  Filled 2020-02-11: qty 30

## 2020-02-11 MED ORDER — PHENYLEPHRINE HCL (PRESSORS) 10 MG/ML IV SOLN
INTRAVENOUS | Status: AC
  Filled 2020-02-11: qty 1

## 2020-02-11 MED ORDER — METHOCARBAMOL 1000 MG/10ML IJ SOLN
500.0000 mg | Freq: Four times a day (QID) | INTRAVENOUS | Status: DC | PRN
  Filled 2020-02-11: qty 5

## 2020-02-11 MED ORDER — SODIUM CHLORIDE FLUSH 0.9 % IV SOLN
INTRAVENOUS | Status: AC
  Filled 2020-02-11: qty 40

## 2020-02-11 MED ORDER — NITROGLYCERIN 0.4 MG SL SUBL: 0.4000 mg | SUBLINGUAL_TABLET | SUBLINGUAL | Status: DC | PRN

## 2020-02-11 MED ORDER — PREGABALIN 75 MG PO CAPS
300.0000 mg | ORAL_CAPSULE | Freq: Two times a day (BID) | ORAL | Status: DC
  Administered 2020-02-11 – 2020-02-17 (×12): 300 mg via ORAL
  Filled 2020-02-11 (×12): qty 4

## 2020-02-11 MED ORDER — INSULIN GLARGINE 100 UNIT/ML ~~LOC~~ SOLN
80.0000 [IU] | Freq: Every day | SUBCUTANEOUS | Status: DC
  Administered 2020-02-12 – 2020-02-16 (×5): 80 [IU] via SUBCUTANEOUS
  Filled 2020-02-11 (×7): qty 0.8

## 2020-02-11 MED ORDER — PHENOL 1.4 % MT LIQD
1.0000 | OROMUCOSAL | Status: DC | PRN
  Filled 2020-02-11: qty 177

## 2020-02-11 MED ORDER — CEFAZOLIN SODIUM-DEXTROSE 2-4 GM/100ML-% IV SOLN
2.0000 g | INTRAVENOUS | Status: AC
  Administered 2020-02-11: 2 g via INTRAVENOUS

## 2020-02-11 MED ORDER — ZOLPIDEM TARTRATE 5 MG PO TABS
5.0000 mg | ORAL_TABLET | Freq: Every evening | ORAL | Status: DC | PRN
  Administered 2020-02-14: 5 mg via ORAL
  Filled 2020-02-11: qty 1

## 2020-02-11 MED ORDER — MORPHINE SULFATE (PF) 10 MG/ML IV SOLN
INTRAVENOUS | Status: DC | PRN
  Administered 2020-02-11: 10 mg

## 2020-02-11 MED ORDER — KETAMINE HCL 50 MG/ML IJ SOLN
INTRAMUSCULAR | Status: AC
  Filled 2020-02-11: qty 10

## 2020-02-11 MED ORDER — GEMFIBROZIL 600 MG PO TABS
600.0000 mg | ORAL_TABLET | Freq: Two times a day (BID) | ORAL | Status: DC
Start: 2020-02-11 — End: 2020-02-17
  Administered 2020-02-12 – 2020-02-17 (×11): 600 mg via ORAL
  Filled 2020-02-11 (×14): qty 1

## 2020-02-11 MED ORDER — GLYCOPYRROLATE 0.2 MG/ML IJ SOLN
INTRAMUSCULAR | Status: DC | PRN
  Administered 2020-02-11: .2 mg via INTRAVENOUS

## 2020-02-11 MED ORDER — DOXEPIN HCL 100 MG PO CAPS
100.0000 mg | ORAL_CAPSULE | Freq: Every morning | ORAL | Status: DC
  Administered 2020-02-12 – 2020-02-17 (×5): 100 mg via ORAL
  Filled 2020-02-11 (×7): qty 1

## 2020-02-11 MED ORDER — SALINE SPRAY 0.65 % NA SOLN
1.0000 | Freq: Two times a day (BID) | NASAL | Status: DC | PRN
  Filled 2020-02-11: qty 44

## 2020-02-11 MED ORDER — ONDANSETRON HCL 4 MG/2ML IJ SOLN
INTRAMUSCULAR | Status: AC
  Filled 2020-02-11: qty 2

## 2020-02-11 MED ORDER — ISOSORBIDE DINITRATE 30 MG PO TABS
30.0000 mg | ORAL_TABLET | Freq: Three times a day (TID) | ORAL | Status: DC
  Administered 2020-02-11 – 2020-02-17 (×18): 30 mg via ORAL
  Filled 2020-02-11 (×20): qty 1

## 2020-02-11 MED ORDER — ESMOLOL HCL 100 MG/10ML IV SOLN
INTRAVENOUS | Status: AC
  Filled 2020-02-11: qty 10

## 2020-02-11 MED ORDER — MENTHOL 3 MG MT LOZG
1.0000 | LOZENGE | OROMUCOSAL | Status: DC | PRN
  Filled 2020-02-11: qty 9

## 2020-02-11 MED ORDER — METOCLOPRAMIDE HCL 10 MG PO TABS: 5.0000 mg | ORAL_TABLET | Freq: Three times a day (TID) | ORAL | Status: DC | PRN

## 2020-02-11 MED ORDER — BUPIVACAINE-EPINEPHRINE (PF) 0.25% -1:200000 IJ SOLN
INTRAMUSCULAR | Status: AC
  Filled 2020-02-11: qty 30

## 2020-02-11 MED ORDER — MIDAZOLAM HCL 2 MG/2ML IJ SOLN
INTRAMUSCULAR | Status: AC
  Filled 2020-02-11: qty 2

## 2020-02-11 MED ORDER — ACETAMINOPHEN 10 MG/ML IV SOLN
INTRAVENOUS | Status: DC | PRN
  Administered 2020-02-11: 1000 mg via INTRAVENOUS

## 2020-02-11 MED ORDER — HYDROMORPHONE HCL 1 MG/ML IJ SOLN: 0.5000 mg | INTRAMUSCULAR | Status: DC | PRN

## 2020-02-11 MED ORDER — MECLIZINE HCL 25 MG PO TABS
25.0000 mg | ORAL_TABLET | Freq: Two times a day (BID) | ORAL | Status: DC
Start: 2020-02-11 — End: 2020-02-17
  Administered 2020-02-11 – 2020-02-17 (×11): 25 mg via ORAL
  Filled 2020-02-11 (×14): qty 1

## 2020-02-11 MED ORDER — INSULIN REGULAR HUMAN 100 UNIT/ML IJ SOLN: 50.0000 [IU] | Freq: Three times a day (TID) | INTRAMUSCULAR | Status: DC

## 2020-02-11 MED ORDER — FENTANYL CITRATE (PF) 100 MCG/2ML IJ SOLN
INTRAMUSCULAR | Status: AC
  Filled 2020-02-11: qty 2

## 2020-02-11 MED ORDER — DIPHENHYDRAMINE HCL 12.5 MG/5ML PO ELIX
12.5000 mg | ORAL_SOLUTION | ORAL | Status: DC | PRN
Start: 2020-02-11 — End: 2020-02-17
  Administered 2020-02-12 (×2): 25 mg via ORAL
  Filled 2020-02-11 (×3): qty 10

## 2020-02-11 MED ORDER — MIDAZOLAM HCL 5 MG/5ML IJ SOLN
INTRAMUSCULAR | Status: DC | PRN
  Administered 2020-02-11: 2 mg via INTRAVENOUS

## 2020-02-11 MED ORDER — CEFAZOLIN SODIUM-DEXTROSE 2-4 GM/100ML-% IV SOLN
2.0000 g | Freq: Four times a day (QID) | INTRAVENOUS | Status: AC
  Administered 2020-02-11 (×2): 2 g via INTRAVENOUS
  Filled 2020-02-11 (×2): qty 100

## 2020-02-11 MED ORDER — ATORVASTATIN CALCIUM 20 MG PO TABS
40.0000 mg | ORAL_TABLET | Freq: Every day | ORAL | Status: DC
  Administered 2020-02-11 – 2020-02-17 (×7): 40 mg via ORAL
  Filled 2020-02-11 (×7): qty 2

## 2020-02-11 MED ORDER — ADULT MULTIVITAMIN W/MINERALS CH
1.0000 | ORAL_TABLET | Freq: Every day | ORAL | Status: DC
  Administered 2020-02-12 – 2020-02-17 (×6): 1 via ORAL
  Filled 2020-02-11 (×7): qty 1

## 2020-02-11 MED ORDER — DEXMEDETOMIDINE (PRECEDEX) IN NS 20 MCG/5ML (4 MCG/ML) IV SYRINGE
PREFILLED_SYRINGE | INTRAVENOUS | Status: AC
  Filled 2020-02-11: qty 5

## 2020-02-11 MED ORDER — METFORMIN HCL ER 500 MG PO TB24
1500.0000 mg | ORAL_TABLET | Freq: Every day | ORAL | Status: DC
  Administered 2020-02-13 – 2020-02-16 (×4): 1500 mg via ORAL
  Filled 2020-02-11 (×7): qty 3

## 2020-02-11 MED ORDER — PROPOFOL 500 MG/50ML IV EMUL
INTRAVENOUS | Status: AC
  Filled 2020-02-11: qty 50

## 2020-02-11 MED ORDER — LISINOPRIL 10 MG PO TABS
10.0000 mg | ORAL_TABLET | Freq: Every day | ORAL | Status: DC
  Administered 2020-02-11 – 2020-02-17 (×7): 10 mg via ORAL
  Filled 2020-02-11 (×7): qty 1

## 2020-02-11 MED ORDER — MAGNESIUM CITRATE PO SOLN
1.0000 | Freq: Once | ORAL | Status: AC | PRN
  Administered 2020-02-15: 1 via ORAL
  Filled 2020-02-11: qty 296

## 2020-02-11 MED ORDER — ACETAMINOPHEN 500 MG PO TABS
1000.0000 mg | ORAL_TABLET | Freq: Four times a day (QID) | ORAL | Status: AC
  Administered 2020-02-11 – 2020-02-12 (×4): 1000 mg via ORAL
  Filled 2020-02-11 (×4): qty 2

## 2020-02-11 MED ORDER — CHLORHEXIDINE GLUCONATE 0.12 % MT SOLN: 15.0000 mL | Freq: Once | OROMUCOSAL | Status: AC

## 2020-02-11 MED ORDER — METOCLOPRAMIDE HCL 5 MG/ML IJ SOLN: 5.0000 mg | Freq: Three times a day (TID) | INTRAMUSCULAR | Status: DC | PRN

## 2020-02-11 MED ORDER — FAMOTIDINE 20 MG PO TABS: 20.0000 mg | ORAL_TABLET | Freq: Once | ORAL | Status: AC

## 2020-02-11 MED ORDER — BISACODYL 10 MG RE SUPP: 10.0000 mg | Freq: Every day | RECTAL | Status: DC | PRN

## 2020-02-11 MED ORDER — FENTANYL CITRATE (PF) 100 MCG/2ML IJ SOLN: 25.0000 ug | INTRAMUSCULAR | Status: DC | PRN

## 2020-02-11 MED ORDER — DOCUSATE SODIUM 100 MG PO CAPS
100.0000 mg | ORAL_CAPSULE | Freq: Two times a day (BID) | ORAL | Status: DC
  Administered 2020-02-11 – 2020-02-17 (×12): 100 mg via ORAL
  Filled 2020-02-11 (×12): qty 1

## 2020-02-11 MED ORDER — NEOMYCIN-POLYMYXIN B GU 40-200000 IR SOLN
Status: DC | PRN
  Administered 2020-02-11: 2 mL

## 2020-02-11 MED ORDER — OXYCODONE HCL 5 MG PO TABS
5.0000 mg | ORAL_TABLET | ORAL | Status: DC | PRN
  Administered 2020-02-12 – 2020-02-13 (×3): 5 mg via ORAL
  Administered 2020-02-14: 10 mg via ORAL
  Filled 2020-02-11: qty 2
  Filled 2020-02-11: qty 1
  Filled 2020-02-11: qty 2
  Filled 2020-02-11: qty 1

## 2020-02-11 MED ORDER — PROPOFOL 500 MG/50ML IV EMUL
INTRAVENOUS | Status: DC | PRN
  Administered 2020-02-11: 50 ug/kg/min via INTRAVENOUS
  Administered 2020-02-11: 60 ug/kg/min via INTRAVENOUS

## 2020-02-11 MED ORDER — ACETAMINOPHEN 325 MG PO TABS
325.0000 mg | ORAL_TABLET | Freq: Four times a day (QID) | ORAL | Status: DC | PRN
Start: 2020-02-12 — End: 2020-02-17
  Administered 2020-02-14 – 2020-02-17 (×6): 650 mg via ORAL
  Filled 2020-02-11 (×6): qty 2

## 2020-02-11 MED ORDER — ACETAMINOPHEN 10 MG/ML IV SOLN
INTRAVENOUS | Status: AC
  Filled 2020-02-11: qty 100

## 2020-02-11 MED ORDER — DONEPEZIL HCL 5 MG PO TABS
10.0000 mg | ORAL_TABLET | Freq: Every day | ORAL | Status: DC
  Administered 2020-02-11 – 2020-02-17 (×7): 10 mg via ORAL
  Filled 2020-02-11 (×7): qty 2

## 2020-02-11 MED ORDER — ENOXAPARIN SODIUM 30 MG/0.3ML ~~LOC~~ SOLN
30.0000 mg | Freq: Two times a day (BID) | SUBCUTANEOUS | Status: DC
  Administered 2020-02-12 – 2020-02-17 (×11): 30 mg via SUBCUTANEOUS
  Filled 2020-02-11 (×11): qty 0.3

## 2020-02-11 MED ORDER — CHLORHEXIDINE GLUCONATE 0.12 % MT SOLN
OROMUCOSAL | Status: AC
  Administered 2020-02-11: 15 mL via OROMUCOSAL
  Filled 2020-02-11: qty 15

## 2020-02-11 MED ORDER — GLYCOPYRROLATE 0.2 MG/ML IJ SOLN
INTRAMUSCULAR | Status: AC
  Filled 2020-02-11: qty 1

## 2020-02-11 MED ORDER — PHENYLEPHRINE HCL (PRESSORS) 10 MG/ML IV SOLN
INTRAVENOUS | Status: DC | PRN
  Administered 2020-02-11 (×3): 100 ug via INTRAVENOUS

## 2020-02-11 MED ORDER — CARVEDILOL 3.125 MG PO TABS
3.1250 mg | ORAL_TABLET | Freq: Two times a day (BID) | ORAL | Status: DC
  Administered 2020-02-12 – 2020-02-17 (×11): 3.125 mg via ORAL
  Filled 2020-02-11 (×11): qty 1

## 2020-02-11 MED ORDER — FAMOTIDINE 20 MG PO TABS
40.0000 mg | ORAL_TABLET | Freq: Every day | ORAL | Status: DC
  Administered 2020-02-11 – 2020-02-16 (×6): 40 mg via ORAL
  Filled 2020-02-11 (×6): qty 2

## 2020-02-11 MED ORDER — INSULIN GLARGINE 100 UNIT/ML ~~LOC~~ SOLN: 100.0000 [IU] | Freq: Every day | SUBCUTANEOUS | Status: DC

## 2020-02-11 MED ORDER — BUPIVACAINE-EPINEPHRINE (PF) 0.25% -1:200000 IJ SOLN
INTRAMUSCULAR | Status: DC | PRN
  Administered 2020-02-11: 30 mL

## 2020-02-11 MED ORDER — INSULIN ASPART 100 UNIT/ML ~~LOC~~ SOLN
0.0000 [IU] | Freq: Three times a day (TID) | SUBCUTANEOUS | Status: DC
  Administered 2020-02-11 – 2020-02-12 (×2): 3 [IU] via SUBCUTANEOUS
  Administered 2020-02-12: 2 [IU] via SUBCUTANEOUS
  Administered 2020-02-13: 3 [IU] via SUBCUTANEOUS
  Administered 2020-02-13: 2 [IU] via SUBCUTANEOUS
  Administered 2020-02-13: 3 [IU] via SUBCUTANEOUS
  Administered 2020-02-14 – 2020-02-17 (×5): 2 [IU] via SUBCUTANEOUS
  Filled 2020-02-11 (×11): qty 1

## 2020-02-11 MED ORDER — SODIUM CHLORIDE 0.9 % IV SOLN: INTRAVENOUS | Status: DC

## 2020-02-11 MED ORDER — DOXEPIN HCL 100 MG PO CAPS
200.0000 mg | ORAL_CAPSULE | Freq: Every day | ORAL | Status: DC
  Administered 2020-02-11 – 2020-02-16 (×6): 200 mg via ORAL
  Filled 2020-02-11 (×7): qty 2

## 2020-02-11 MED ORDER — MORPHINE SULFATE (PF) 10 MG/ML IV SOLN
INTRAVENOUS | Status: AC
  Filled 2020-02-11: qty 1

## 2020-02-11 MED ORDER — ONDANSETRON HCL 4 MG/2ML IJ SOLN: 4.0000 mg | Freq: Four times a day (QID) | INTRAMUSCULAR | Status: DC | PRN

## 2020-02-11 MED ORDER — DEXMEDETOMIDINE (PRECEDEX) IN NS 20 MCG/5ML (4 MCG/ML) IV SYRINGE
PREFILLED_SYRINGE | INTRAVENOUS | Status: DC | PRN
  Administered 2020-02-11 (×5): 4 ug via INTRAVENOUS

## 2020-02-11 MED ORDER — ASPIRIN EC 325 MG PO TBEC
325.0000 mg | DELAYED_RELEASE_TABLET | Freq: Every day | ORAL | Status: DC
  Administered 2020-02-12 – 2020-02-17 (×6): 325 mg via ORAL
  Filled 2020-02-11 (×6): qty 1

## 2020-02-11 MED ORDER — FAMOTIDINE 20 MG PO TABS
ORAL_TABLET | ORAL | Status: AC
  Administered 2020-02-11: 20 mg via ORAL
  Filled 2020-02-11: qty 1

## 2020-02-11 MED ORDER — LIDOCAINE HCL (PF) 1 % IJ SOLN
INTRAMUSCULAR | Status: DC | PRN
  Administered 2020-02-11: 3 mL

## 2020-02-11 MED ORDER — INSULIN ASPART 100 UNIT/ML ~~LOC~~ SOLN
15.0000 [IU] | Freq: Three times a day (TID) | SUBCUTANEOUS | Status: DC
  Administered 2020-02-11 – 2020-02-17 (×10): 15 [IU] via SUBCUTANEOUS
  Filled 2020-02-11 (×10): qty 1

## 2020-02-11 MED ORDER — SODIUM CHLORIDE 0.9 % IV SOLN
INTRAVENOUS | Status: DC | PRN
  Administered 2020-02-11: 60 mL

## 2020-02-11 MED ORDER — BUPIVACAINE HCL (PF) 0.5 % IJ SOLN
INTRAMUSCULAR | Status: DC | PRN
  Administered 2020-02-11: 3 mL

## 2020-02-11 SURGICAL SUPPLY — 78 items
APL PRP STRL LF DISP 70% ISPRP (MISCELLANEOUS) ×2
BLADE SAGITTAL 25.0X1.19X90 (BLADE) ×2 IMPLANT
BLADE SAW 90X13X1.19 OSCILLAT (BLADE) ×2 IMPLANT
BLOCK CUTTING FEMUR 5 RT MED (MISCELLANEOUS) ×1 IMPLANT
BLOCK CUTTING TIBIAL 4 RT MIS (MISCELLANEOUS) ×1 IMPLANT
BLOCK CUTTING TIBIAL 5 RT (MISCELLANEOUS) ×1 IMPLANT
BNDG ELASTIC 6X5.8 VLCR STR LF (GAUZE/BANDAGES/DRESSINGS) ×2 IMPLANT
CANISTER SUCT 1200ML W/VALVE (MISCELLANEOUS) ×2 IMPLANT
CANISTER SUCT 3000ML PPV (MISCELLANEOUS) ×4 IMPLANT
CANISTER WOUND CARE 500ML ATS (WOUND CARE) ×2 IMPLANT
CEMENT HV SMART SET (Cement) ×4 IMPLANT
CHLORAPREP W/TINT 26 (MISCELLANEOUS) ×4 IMPLANT
COOLER POLAR GLACIER W/PUMP (MISCELLANEOUS) ×2 IMPLANT
COVER WAND RF STERILE (DRAPES) ×2 IMPLANT
CUFF TOURN SGL QUICK 24 (TOURNIQUET CUFF)
CUFF TOURN SGL QUICK 30 (TOURNIQUET CUFF)
CUFF TRNQT CYL 24X4X16.5-23 (TOURNIQUET CUFF) IMPLANT
CUFF TRNQT CYL 30X4X21-28X (TOURNIQUET CUFF) IMPLANT
DRAPE 3/4 80X56 (DRAPES) ×4 IMPLANT
DRSG MEPILEX SACRM 8.7X9.8 (GAUZE/BANDAGES/DRESSINGS) ×2 IMPLANT
ELECT CAUTERY BLADE 6.4 (BLADE) ×2 IMPLANT
ELECT REM PT RETURN 9FT ADLT (ELECTROSURGICAL) ×2
ELECTRODE REM PT RTRN 9FT ADLT (ELECTROSURGICAL) ×1 IMPLANT
FEMUR BONE MODEL (MISCELLANEOUS) ×1 IMPLANT
FEMUR SHPERE CEMENT RIGHT SZ5 (Femur) ×1 IMPLANT
GAUZE SPONGE 4X4 12PLY STRL (GAUZE/BANDAGES/DRESSINGS) ×2 IMPLANT
GAUZE XEROFORM 1X8 LF (GAUZE/BANDAGES/DRESSINGS) ×2 IMPLANT
GLOVE BIOGEL PI IND STRL 9 (GLOVE) ×1 IMPLANT
GLOVE BIOGEL PI INDICATOR 9 (GLOVE) ×1
GLOVE INDICATOR 8.0 STRL GRN (GLOVE) ×2 IMPLANT
GLOVE SURG ORTHO 8.0 STRL STRW (GLOVE) ×2 IMPLANT
GLOVE SURG SYN 9.0  PF PI (GLOVE) ×2
GLOVE SURG SYN 9.0 PF PI (GLOVE) ×1 IMPLANT
GOWN SRG 2XL LVL 4 RGLN SLV (GOWNS) ×1 IMPLANT
GOWN STRL NON-REIN 2XL LVL4 (GOWNS) ×2
GOWN STRL REUS W/ TWL LRG LVL3 (GOWN DISPOSABLE) ×1 IMPLANT
GOWN STRL REUS W/ TWL XL LVL3 (GOWN DISPOSABLE) ×1 IMPLANT
GOWN STRL REUS W/TWL LRG LVL3 (GOWN DISPOSABLE) ×2
GOWN STRL REUS W/TWL XL LVL3 (GOWN DISPOSABLE) ×2
HOLDER FOLEY CATH W/STRAP (MISCELLANEOUS) ×2 IMPLANT
HOOD PEEL AWAY FLYTE STAYCOOL (MISCELLANEOUS) ×4 IMPLANT
INSERT TIBIAL FIXED SZ5 RT 11M (Insert) ×1 IMPLANT
IRRIGATION SURGIPHOR STRL (IV SOLUTION) IMPLANT
KIT PREVENA INCISION MGT20CM45 (CANNISTER) ×2 IMPLANT
KIT TURNOVER KIT A (KITS) ×2 IMPLANT
MANIFOLD NEPTUNE II (INSTRUMENTS) ×2 IMPLANT
NDL SAFETY ECLIPSE 18X1.5 (NEEDLE) ×1 IMPLANT
NDL SPNL 18GX3.5 QUINCKE PK (NEEDLE) ×1 IMPLANT
NDL SPNL 20GX3.5 QUINCKE YW (NEEDLE) ×1 IMPLANT
NEEDLE HYPO 18GX1.5 SHARP (NEEDLE) ×2
NEEDLE SPNL 18GX3.5 QUINCKE PK (NEEDLE) ×2 IMPLANT
NEEDLE SPNL 20GX3.5 QUINCKE YW (NEEDLE) ×2 IMPLANT
NS IRRIG 1000ML POUR BTL (IV SOLUTION) ×2 IMPLANT
PACK TOTAL KNEE (MISCELLANEOUS) ×2 IMPLANT
PAD WRAPON POLAR KNEE (MISCELLANEOUS) ×1 IMPLANT
PATELLA RESURFACING MEDACTA SZ (Bone Implant) ×1 IMPLANT
PENCIL SMOKE EVACUATOR COATED (MISCELLANEOUS) ×2 IMPLANT
PULSAVAC PLUS IRRIG FAN TIP (DISPOSABLE) ×2
SCALPEL PROTECTED #10 DISP (BLADE) ×4 IMPLANT
SOL .9 NS 3000ML IRR  AL (IV SOLUTION) ×2
SOL .9 NS 3000ML IRR AL (IV SOLUTION) ×1
SOL .9 NS 3000ML IRR UROMATIC (IV SOLUTION) ×1 IMPLANT
STAPLER SKIN PROX 35W (STAPLE) ×2 IMPLANT
STEM EXTENSION 11MMX30MM (Stem) ×1 IMPLANT
SUCTION FRAZIER HANDLE 10FR (MISCELLANEOUS) ×2
SUCTION TUBE FRAZIER 10FR DISP (MISCELLANEOUS) ×1 IMPLANT
SUT DVC 2 QUILL PDO  T11 36X36 (SUTURE) ×2
SUT DVC 2 QUILL PDO T11 36X36 (SUTURE) ×1 IMPLANT
SUT ETHIBOND 2 V 37 (SUTURE) IMPLANT
SUT V-LOC 90 ABS DVC 3-0 CL (SUTURE) ×2 IMPLANT
SYR 20ML LL LF (SYRINGE) ×2 IMPLANT
SYR 50ML LL SCALE MARK (SYRINGE) ×4 IMPLANT
TIBIAL TRAY FIXED (Bone Implant) ×1 IMPLANT
TIP FAN IRRIG PULSAVAC PLUS (DISPOSABLE) ×1 IMPLANT
TOWEL OR 17X26 4PK STRL BLUE (TOWEL DISPOSABLE) ×2 IMPLANT
TOWER CARTRIDGE SMART MIX (DISPOSABLE) ×2 IMPLANT
TRAY FOLEY MTR SLVR 16FR STAT (SET/KITS/TRAYS/PACK) ×2 IMPLANT
WRAPON POLAR PAD KNEE (MISCELLANEOUS) ×2

## 2020-02-11 NOTE — Op Note (Signed)
02/11/2020  9:48 AM  PATIENT:  Todd Spencer   MRN: [id number]  PRE-OPERATIVE DIAGNOSIS:  Primary localized osteoarthritis of right knee   POST-OPERATIVE DIAGNOSIS:  Same   PROCEDURE:  Procedure(s): Right TOTAL KNEE ARTHROPLASTY   SURGEON: Laurene Footman, MD   ASSISTANTS: Rachelle Hora, PA-C   ANESTHESIA:   spinal   EBL:   100   BLOOD ADMINISTERED:none   DRAINS: Incisional wound VAC    LOCAL MEDICATIONS USED:  MARCAINE    and OTHER morphine and Toradol   SPECIMEN:  No Specimen   DISPOSITION OF SPECIMEN:  N/A   COUNTS:  YES   TOURNIQUET:   71 at 300 mm Hg   IMPLANTS: Medacta  GMK sphere system with  5+ right femur, 5 right tibia with short stem and  11 mm insert.  Size  3 patella, all components cemented.   DICTATION: [name] Dictation   patient was brought to the operating room and spinal anesthesia was obtained.  After prepping and draping the  right leg in sterile fashion, and after patient identification and timeout procedures were completed, tourniquet was raised  and midline skin incision was made followed by medial parapatellar arthrotomy with  severe medial compartment osteoarthritis, severe patellofemoral arthritis and  severe lateral compartment arthritis, partial synovectomy was also carried out.     There was a large spur within the patellar tendon which was partially debrided anteriorly.  The ACL and PCL and fat pad were excised along with anterior horns of the meniscus. The proximal tibia cutting guide from  the [facility] system was applied and the proximal tibia cut carried out.  The distal femoral cut was carried out in a similar fashion     The  5+ femoral cutting guide applied with anterior posterior and chamfer cuts made.  The posterior horns of the menisci were removed at this point.   Injection of the above medication was carried out after the femoral and tibial cuts were carried out.  The  5 baseplate trial was placed pinned into position and proximal tibial  preparation carried out with drilling hand reaming and the keel punch followed by placement of the  5+ femur and sizing the tibial insert size   11 millimeter gave the best fit with stability and full extension.  The distal femoral drill holes were made in the notch cut for the trochlear groove was then carried out with trials were then removed the patella was cut using the patellar cutting guide and it sized to a size  3 after drill holes have been made  The knee was irrigated with pulsatile lavage and the bony surfaces dried the tibial component was cemented into place first.  Excess cement was removed and the polyethylene insert placed with a torque screw placed with a torque screwdriver tightened.  The distal femoral component was placed and the knee was held in extension as the patellar button was clamped into place.  After the cement was set, excess cement was removed and the knee was again irrigated thoroughly thoroughly irrigated.  The tourniquet was let down and hemostasis checked with electrocautery. The arthrotomy was repaired with a heavy Quill suture,  followed by 3-0 V lock subcuticular closure, skin staples followed by incisional wound VAC and Polar Care.Marland Kitchen   PLAN OF CARE: Admit to inpatient    PATIENT DISPOSITION:  PACU - hemodynamically stable.

## 2020-02-11 NOTE — Transfer of Care (Signed)
Immediate Anesthesia Transfer of Care Note  Patient: Todd Spencer  Procedure(s) Performed: TOTAL KNEE ARTHROPLASTY (Right Knee)  Patient Location: PACU and Endoscopy Unit  Anesthesia Type:Spinal  Level of Consciousness: awake, alert  and oriented  Airway & Oxygen Therapy: Patient Spontanous Breathing and Patient connected to face mask oxygen  Post-op Assessment: Report given to RN and Post -op Vital signs reviewed and stable  Post vital signs: Reviewed and stable  Last Vitals:  Vitals Value Taken Time  BP 108/55 02/11/20 0944  Temp    Pulse 48 02/11/20 0945  Resp 13 02/11/20 0945  SpO2 100 % 02/11/20 0945  Vitals shown include unvalidated device data.  Last Pain:  Vitals:   02/11/20 0650  TempSrc: Oral  PainSc: 0-No pain         Complications: No complications documented.

## 2020-02-11 NOTE — Anesthesia Procedure Notes (Signed)
Spinal  Patient location during procedure: OR Start time: 02/11/2020 7:25 AM End time: 02/11/2020 7:29 AM Staffing Performed: resident/CRNA  Anesthesiologist: Alvin Critchley, MD Resident/CRNA: Norm Salt, CRNA Preanesthetic Checklist Completed: patient identified, IV checked, site marked, risks and benefits discussed, surgical consent, monitors and equipment checked and pre-op evaluation Spinal Block Patient position: sitting Prep: ChloraPrep Patient monitoring: heart rate, continuous pulse ox and blood pressure Approach: midline Location: L3-4 Injection technique: single-shot Needle Needle type: Pencan  Needle gauge: 24 G Needle length: 10 cm

## 2020-02-11 NOTE — Evaluation (Signed)
Physical Therapy Evaluation Patient Details Name: Todd Spencer MRN: [id number] DOB: [birth year] Today's Date: 02/11/2020   History of Present Illness  Pt is a 69 yo male diagnosed with localized osteoarthritis of the right knee and is s/p elective R TKA.  PMH includes: HTN, angina with exertion, CAD, MI, CABG, CHF, DOE, depression, dementia, and OA.    Clinical Impression  Pt was pleasant and motivated to participate during the session.  Pt required min A for RLE and trunk control with bed mobility tasks and mod A to come to standing at the EOB.  Pt was unable to stand with full, upright posture and could only take a maximum of 2-3 very small, shuffling steps at the EOB before requiring to return to sitting. Pt reported no adverse symptoms during the session other than R knee pain.  Pt will benefit from PT services in a SNF setting upon discharge to safely address deficits listed in patient problem list for decreased caregiver assistance and eventual return to PLOF.      Follow Up Recommendations SNF    Equipment Recommendations  Other (comment) (TBD at next venue of care)    Recommendations for Other Services       Precautions / Restrictions Precautions Precautions: Fall Restrictions Weight Bearing Restrictions: Yes RLE Weight Bearing: Weight bearing as tolerated      Mobility  Bed Mobility Overal bed mobility: Needs Assistance Bed Mobility: Supine to Sit;Sit to Supine     Supine to sit: Min assist Sit to supine: Min assist   General bed mobility comments: Min A for RLE and trunk control/positioning    Transfers Overall transfer level: Needs assistance Equipment used: Rolling walker (2 wheeled) Transfers: Sit to/from Stand Sit to Stand: Mod assist;From elevated surface         General transfer comment: Mod A and several rocking attempts required to stand from an elevated EOB with cues for hand and foot placement  Ambulation/Gait Ambulation/Gait assistance:  Min assist Gait Distance (Feet): 1 Feet Assistive device: Rolling walker (2 wheeled) Gait Pattern/deviations: Step-to pattern;Shuffle;Antalgic;Trunk flexed Gait velocity: decreased   General Gait Details: Pt only able to take 2-3 very small, shuffling steps at the EOB with heavy lean on the RW and trunk flexed  Stairs            Wheelchair Mobility    Modified Rankin (Stroke Patients Only)       Balance Overall balance assessment: Needs assistance   Sitting balance-Leahy Scale: Good     Standing balance support: Bilateral upper extremity supported;During functional activity Standing balance-Leahy Scale: Fair Standing balance comment: heavy lean on the RW for support                             Pertinent Vitals/Pain Pain Assessment: 0-10 Pain Score: 9  Pain Location: R knee Pain Descriptors / Indicators: Aching;Sore Pain Intervention(s): Premedicated before session;Monitored during session    Home Living Family/patient expects to be discharged to:: Private residence Living Arrangements: Alone Available Help at Discharge: Friend(s);Available PRN/intermittently Type of Home: House Home Access: Stairs to enter Entrance Stairs-Rails: Left Entrance Stairs-Number of Steps: 4 Home Layout: One level Home Equipment: Walker - standard Additional Comments: walking sick    Prior Function           Comments: Ind amb in home with furniture cruising, walking stick outdoors in community, 2 falls from LOB in last 6 months, Ind with ADLs  Hand Dominance        Extremity/Trunk Assessment   Upper Extremity Assessment Upper Extremity Assessment: Overall WFL for tasks assessed    Lower Extremity Assessment Lower Extremity Assessment: Generalized weakness;RLE deficits/detail RLE Deficits / Details: BLE ankle strength and AROM WNL; BLE sensation to light touch grossly intact and equal left/right pt able to perform one effortful RLE SLR RLE: Unable to  fully assess due to pain RLE Sensation: WNL       Communication   Communication: HOH  Cognition Arousal/Alertness: Awake/alert Behavior During Therapy: WFL for tasks assessed/performed Overall Cognitive Status: Within Functional Limits for tasks assessed                                        General Comments      Exercises Total Joint Exercises Ankle Circles/Pumps: AROM;Strengthening;Both;10 reps Quad Sets: Strengthening;Right;5 reps;10 reps Hip ABduction/ADduction: AAROM;Right;10 reps Straight Leg Raises: AAROM;Right;10 reps Long Arc Quad: AROM;Strengthening;Right;10 reps;15 reps Knee Flexion: AROM;Strengthening;Right;10 reps;15 reps Goniometric ROM: R knee AROM: 15-80 deg Marching in Standing: AROM;Strengthening;Right;10 reps;Standing Other Exercises Other Exercises: HEP education and review for BLE APs, QS, and LAQs Other Exercises: Positioning education to promote R knee ext PROM   Assessment/Plan    PT Assessment Patient needs continued PT services  PT Problem List Decreased strength;Decreased range of motion;Decreased activity tolerance;Decreased balance;Decreased mobility;Decreased knowledge of use of DME;Pain       PT Treatment Interventions DME instruction;Gait training;Stair training;Functional mobility training;Therapeutic activities;Therapeutic exercise;Balance training;Patient/family education    PT Goals (Current goals can be found in the Care Plan section)  Acute Rehab PT Goals Patient Stated Goal: To walk better PT Goal Formulation: With patient Time For Goal Achievement: 02/24/20 Potential to Achieve Goals: Good    Frequency BID   Barriers to discharge Inaccessible home environment;Decreased caregiver support      Co-evaluation               AM-PAC PT "6 Clicks" Mobility  Outcome Measure Help needed turning from your back to your side while in a flat bed without using bedrails?: A Little Help needed moving from lying on  your back to sitting on the side of a flat bed without using bedrails?: A Little Help needed moving to and from a bed to a chair (including a wheelchair)?: A Lot Help needed standing up from a chair using your arms (e.g., wheelchair or bedside chair)?: A Lot Help needed to walk in hospital room?: Total Help needed climbing 3-5 steps with a railing? : Total 6 Click Score: 12    End of Session Equipment Utilized During Treatment: Gait belt Activity Tolerance: Patient tolerated treatment well Patient left: in bed;with call bell/phone within reach;with bed alarm set;with SCD's reapplied;Other (comment) (polar care donned to R knee) Nurse Communication: Mobility status;Weight bearing status PT Visit Diagnosis: Unsteadiness on feet (R26.81);History of falling (Z91.81);Other abnormalities of gait and mobility (R26.89);Muscle weakness (generalized) (M62.81);Pain Pain - Right/Left: Right Pain - part of body: Knee    Time: 1608-1700 PT Time Calculation (min) (ACUTE ONLY): 52 min   Charges:   PT Evaluation $PT Eval Moderate Complexity: 1 Mod PT Treatments $Therapeutic Exercise: 8-22 mins $Therapeutic Activity: 8-22 mins        D. Royetta Asal PT, DPT 02/11/20, 5:27 PM

## 2020-02-11 NOTE — Anesthesia Procedure Notes (Signed)
Procedure Name: MAC Date/Time: 02/11/2020 8:17 AM Performed by: Lily Peer, Engelbert Sevin, CRNA Pre-anesthesia Checklist: Patient identified, Emergency Drugs available, Suction available and Patient being monitored Patient Re-evaluated:Patient Re-evaluated prior to induction Oxygen Delivery Method: Simple face mask Induction Type: IV induction
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
## 2020-02-11 NOTE — Anesthesia Preprocedure Evaluation (Addendum)
Anesthesia Evaluation  Patient identified by MRN, date of birth, ID band Patient awake    Reviewed: Allergy & Precautions, NPO status , Patient's Chart, lab work & pertinent test results, reviewed documented beta blocker date and time   Airway Mallampati: III  TM Distance: >3 FB Neck ROM: Limited    Dental  (+) Teeth Intact   Pulmonary sleep apnea , former smoker,    Pulmonary exam normal        Cardiovascular Exercise Tolerance: Poor hypertension, Pt. on medications and Pt. on home beta blockers + angina with exertion + CAD, + Past MI, + Cardiac Stents, + CABG, +CHF and + DOE  Normal cardiovascular exam     Neuro/Psych PSYCHIATRIC DISORDERS Depression Dementia  Neuromuscular disease    GI/Hepatic hiatal hernia,   Endo/Other  diabetes, Poorly Controlled, Type 2  Renal/GU Renal InsufficiencyRenal disease     Musculoskeletal negative musculoskeletal ROS (+)   Abdominal (+) + obese,   Peds  Hematology negative hematology ROS (+)   Anesthesia Other Findings EF 35-40%  Reproductive/Obstetrics                             Anesthesia Physical  Anesthesia Plan  ASA: III  Anesthesia Plan: Spinal   Post-op Pain Management:    Induction: Intravenous  PONV Risk Score and Plan:   Airway Management Planned: Nasal Cannula  Additional Equipment:   Intra-op Plan:   Post-operative Plan:   Informed Consent: I have reviewed the patients History and Physical, chart, labs and discussed the procedure including the risks, benefits and alternatives for the proposed anesthesia with the patient or authorized representative who has indicated his/her understanding and acceptance.       Plan Discussed with: CRNA  Anesthesia Plan Comments: (BG 265, CABG, stent CHF, EF35% and schedule for ICD/pacemaker.  Patient cleared by cardiology as overall low risk.)       Anesthesia Quick Evaluation
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
## 2020-02-12 LAB — CBC
HCT: 34.2 % — ABNORMAL LOW (ref 39.0–52.0)
Hemoglobin: 11.3 g/dL — ABNORMAL LOW (ref 13.0–17.0)
MCH: 29.7 pg (ref 26.0–34.0)
MCHC: 33 g/dL (ref 30.0–36.0)
MCV: 90 fL (ref 80.0–100.0)
Platelets: 106 10*3/uL — ABNORMAL LOW (ref 150–400)
RBC: 3.8 MIL/uL — ABNORMAL LOW (ref 4.22–5.81)
RDW: 14.7 % (ref 11.5–15.5)
WBC: 6.1 10*3/uL (ref 4.0–10.5)
nRBC: 0 % (ref 0.0–0.2)

## 2020-02-12 LAB — BASIC METABOLIC PANEL
Anion gap: 7 (ref 5–15)
BUN: 18 mg/dL (ref 8–23)
CO2: 26 mmol/L (ref 22–32)
Calcium: 8.7 mg/dL — ABNORMAL LOW (ref 8.9–10.3)
Chloride: 104 mmol/L (ref 98–111)
Creatinine, Ser: 1.01 mg/dL (ref 0.61–1.24)
GFR, Estimated: >60 mL/min (ref >60)
Glucose, Bld: 107 mg/dL — ABNORMAL HIGH (ref 70–99)
Potassium: 4.4 mmol/L (ref 3.5–5.1)
Sodium: 137 mmol/L (ref 135–145)

## 2020-02-12 LAB — GLUCOSE, CAPILLARY
Glucose-Capillary: 112 mg/dL — ABNORMAL HIGH (ref 70–99)
Glucose-Capillary: 131 mg/dL — ABNORMAL HIGH (ref 70–99)
Glucose-Capillary: 171 mg/dL — ABNORMAL HIGH (ref 70–99)
Glucose-Capillary: 184 mg/dL — ABNORMAL HIGH (ref 70–99)

## 2020-02-12 MED ORDER — SODIUM CHLORIDE 0.9 % IV BOLUS
500.0000 mL | INTRAVENOUS | Status: AC
  Administered 2020-02-12: 500 mL via INTRAVENOUS

## 2020-02-12 MED ORDER — METHOCARBAMOL 500 MG PO TABS: 500.0000 mg | ORAL_TABLET | Freq: Four times a day (QID) | ORAL | 0 refills | Status: DC | PRN

## 2020-02-12 MED ORDER — ENOXAPARIN SODIUM 40 MG/0.4ML ~~LOC~~ SOLN: 40.0000 mg | SUBCUTANEOUS | 0 refills | Status: DC

## 2020-02-12 MED ORDER — OXYCODONE HCL 5 MG PO TABS
5.0000 mg | ORAL_TABLET | ORAL | 0 refills | Status: DC | PRN
Start: 2020-02-12 — End: 2020-09-16

## 2020-02-12 MED ORDER — ACETAMINOPHEN 500 MG PO TABS: 1000.0000 mg | ORAL_TABLET | Freq: Four times a day (QID) | ORAL | 0 refills | Status: AC

## 2020-02-12 NOTE — Progress Notes (Signed)
Physical Therapy Treatment Patient Details Name: Todd Spencer MRN: [id number] DOB: 06-15-1950 Today's Date: 02/12/2020    History of Present Illness Pt is a 69 yo male diagnosed with localized osteoarthritis of the right knee and is s/p elective R TKA.  PMH includes: HTN, angina with exertion, CAD, MI, CABG, CHF, DOE, depression, dementia, and OA.    PT Comments    Pt was pleasant and motivated to participate during the session.  Pt put forth good effort during the session but remained limited functionally and required physical assistance with all tasks.  Pt required heavy assist to come to standing and was able to amb 2-3 feet at the EOB before c/o dizziness and returned to sitting.  Pt's BP taken in sitting at 105/64, HR and SpO2 WNL, with symptoms resolving quickly, nursing aware.  Pt would not be safe to return to his prior living situation at this time and will benefit from PT services in a SNF setting upon discharge to safely address deficits listed in patient problem list for decreased caregiver assistance and eventual return to PLOF.       Follow Up Recommendations  SNF     Equipment Recommendations  Other (comment) (TBD at next venue of care)    Recommendations for Other Services       Precautions / Restrictions Precautions Precautions: Fall Restrictions Weight Bearing Restrictions: Yes RLE Weight Bearing: Weight bearing as tolerated    Mobility  Bed Mobility Overal bed mobility: Needs Assistance Bed Mobility: Supine to Sit;Sit to Supine     Supine to sit: Min assist Sit to supine: Min assist   General bed mobility comments: Min A for RLE and trunk control/positioning  Transfers Overall transfer level: Needs assistance Equipment used: Rolling walker (2 wheeled) Transfers: Sit to/from Stand Sit to Stand: Mod assist;From elevated surface         General transfer comment: Mod A and several rocking attempts required to stand from an elevated EOB with cues  for hand and foot placement  Ambulation/Gait Ambulation/Gait assistance: Min assist Gait Distance (Feet): 2 Feet Assistive device: Rolling walker (2 wheeled) Gait Pattern/deviations: Step-to pattern;Shuffle;Antalgic;Trunk flexed Gait velocity: decreased   General Gait Details: Pt only able to take several very small, shuffling steps at the EOB with heavy lean on the RW and trunk flexed   Stairs             Wheelchair Mobility    Modified Rankin (Stroke Patients Only)       Balance Overall balance assessment: Needs assistance   Sitting balance-Leahy Scale: Good     Standing balance support: Bilateral upper extremity supported;During functional activity Standing balance-Leahy Scale: Fair Standing balance comment: heavy lean on the RW for support                            Cognition Arousal/Alertness: Awake/alert Behavior During Therapy: WFL for tasks assessed/performed Overall Cognitive Status: Within Functional Limits for tasks assessed                                        Exercises Total Joint Exercises Ankle Circles/Pumps: AROM;Strengthening;Both;10 reps Quad Sets: Strengthening;Right;5 reps;10 reps Hip ABduction/ADduction: AAROM;Right;10 reps;5 reps Straight Leg Raises: AAROM;Right;10 reps;5 reps Long Arc Quad: AROM;Strengthening;Right;10 reps;15 reps Knee Flexion: AROM;Strengthening;Right;10 reps;15 reps Goniometric ROM: R knee AROM: 12-78 Marching in Standing: AROM;Strengthening;Right;10 reps;Standing Other  Exercises Other Exercises: HEP education and review for BLE APs, QS, and LAQs Other Exercises: Positioning review to promote R knee ext PROM    General Comments        Pertinent Vitals/Pain Pain Assessment: 0-10 Pain Score: 8  Pain Location: R knee Pain Descriptors / Indicators: Aching;Sore Pain Intervention(s): RN gave pain meds during session;Monitored during session;Repositioned    Home Living                       Prior Function            PT Goals (current goals can now be found in the care plan section) Progress towards PT goals: Progressing toward goals    Frequency    BID      PT Plan Current plan remains appropriate    Co-evaluation              AM-PAC PT "6 Clicks" Mobility   Outcome Measure  Help needed turning from your back to your side while in a flat bed without using bedrails?: A Little Help needed moving from lying on your back to sitting on the side of a flat bed without using bedrails?: A Little Help needed moving to and from a bed to a chair (including a wheelchair)?: A Lot Help needed standing up from a chair using your arms (e.g., wheelchair or bedside chair)?: A Lot Help needed to walk in hospital room?: Total Help needed climbing 3-5 steps with a railing? : Total 6 Click Score: 12    End of Session Equipment Utilized During Treatment: Gait belt Activity Tolerance: Other (comment) (Pt dizzy in standing) Patient left: in bed;with call bell/phone within reach;with bed alarm set;with SCD's reapplied;Other (comment) (Polar care donned to R knee) Nurse Communication: Mobility status;Weight bearing status, pt dizzy in standing PT Visit Diagnosis: Unsteadiness on feet (R26.81);History of falling (Z91.81);Other abnormalities of gait and mobility (R26.89);Muscle weakness (generalized) (M62.81);Pain Pain - Right/Left: Right Pain - part of body: Knee     Time: 1779-3903 PT Time Calculation (min) (ACUTE ONLY): 39 min  Charges:  $Gait Training: 8-22 mins $Therapeutic Exercise: 8-22 mins $Therapeutic Activity: 8-22 mins                     D. Scott Maurica Omura PT, DPT 02/12/20, 10:30 AM

## 2020-02-12 NOTE — TOC Initial Note (Signed)
Transition of Care [facility]) - Initial/Assessment Note    Patient Details  Name: Todd Spencer MRN: [id number] Date of Birth: Feb 29, 1950  Transition of [facility]) CM/SW Contact:    Shelbie Ammons, RN Phone Number: 02/12/2020, 11:36 AM  Clinical Narrative:   RNCM met with patient at bedside to discuss discharge recommendations. Patient is status post right total knee replacement. Patient reports that he lives alone and is in agreement with going to SNF at discharge, he reports that he would like to return to [facility] and doesn't really want to go anywhere else.  RNCM verified PASSR, completed FL2 and sent bed request to [facility].      Expected Discharge Plan: Skilled Nursing Facility Barriers to Discharge: No Barriers Identified   Patient Goals and CMS Choice Patient states their goals for this hospitalization and ongoing recovery are:: I want to go back to [place] offered to / list presented to : Patient  Expected Discharge Plan and Services Expected Discharge Plan: [place] Acute Care Choice: [place] Living arrangements for the past 2 months: Single Family Home                                      Prior Living Arrangements/Services Living arrangements for the past 2 months: Single Family Home Lives with:: Self Patient language and need for interpreter reviewed:: Yes Do you feel safe going back to the place where you live?: Yes      Need for Family Participation in Patient Care: Yes (Comment) Care giver support system in place?: Yes (comment)   Criminal Activity/Legal Involvement Pertinent to Current Situation/Hospitalization: No - Comment as needed  Activities of Daily Living Home Assistive Devices/Equipment: Cane (specify quad or straight),CPAP,CBG Meter,Dentures (specify type) ADL Screening (condition at time of admission) Patient's cognitive ability adequate to safely complete daily  activities?: Yes Is the patient deaf or have difficulty hearing?: Yes Does the patient have difficulty seeing, even when wearing glasses/contacts?: No Does the patient have difficulty concentrating, remembering, or making decisions?: No Patient able to express need for assistance with ADLs?: Yes Does the patient have difficulty dressing or bathing?: No Independently performs ADLs?: Yes (appropriate for developmental age) Does the patient have difficulty walking or climbing stairs?: Yes Weakness of Legs: Right Weakness of Arms/Hands: None  Permission Sought/Granted                  Emotional Assessment Appearance:: Appears stated age   Affect (typically observed): Appropriate Orientation: : Oriented to Self,Oriented to Place,Oriented to  Time,Oriented to Situation Alcohol / Substance Use: Not Applicable Psych Involvement: No (comment)  Admission diagnosis:  S/P TKR (total knee replacement) using cement, right [Z96.651] Patient Active Problem List   Diagnosis Date Noted  . S/P TKR (total knee replacement) using cement, right 02/11/2020  . Incisional hernia 09/10/2019  . Acute appendicitis 03/30/2019  . Status post total knee replacement using cement, left 10/08/2017  . Small bowel obstruction ([place]) 10/27/2016  . Insulin overdose 01/22/2016  . CKD (chronic kidney disease) stage 3, GFR 30-59 ml/min (HCC) 12/12/2015  . Diabetic peripheral neuropathy ([place]) 12/12/2015  . DM2 (diabetes mellitus, type 2) ([place]) 12/12/2015  . Small bowel obstruction due to adhesions ([place]) 12/12/2015  . Intestinal adhesions with obstruction ([place])   . Obesity, Class I, BMI 30-34.9 05/11/2015  . Mixed Alzheimer's and  vascular dementia ([place]) 05/04/2015  . Uncontrolled diabetes mellitus type 2 without complications 54/36/0677  . Cardiomyopathy, ischemic 11/04/2014  . Chronic systolic heart failure ([place]) 11/04/2014  . Atherosclerotic heart disease of native coronary artery without angina pectoris  11/04/2014  . SOB (shortness of breath) on exertion 04/26/2014  . Cardiomyopathy ([place]) 04/23/2014  . H/O cardiac catheterization 04/23/2014  . H/O degenerative disc disease 04/23/2014  . History of PTCA 04/23/2014  . HTN (hypertension) 04/23/2014  . Hyperlipidemia, unspecified 04/23/2014  . MI (myocardial infarction) ([place]) 04/23/2014  . OSA (obstructive sleep apnea) 04/23/2014   PCP:  Maryland Pink, MD Pharmacy:   [id number] [address], [place] [address] Phone: [phone] Fax: [facility] [place], [address] [place] [address] Phone: [phone] Fax: [phone]     Social Determinants of Health (SDOH) Interventions    Readmission Risk Interventions No flowsheet data found.

## 2020-02-12 NOTE — NC FL2 (Signed)
[place] LEVEL OF CARE SCREENING TOOL     IDENTIFICATION  Patient Name: Todd Spencer Birthdate: 1950-07-26 Sex: male Admission Date (Current Location): 02/11/2020  [place] and [place] Number:  Engineering geologist and Address:  [facility], [address], [place], [place] [address]      Provider Number: [id number]  Attending Physician Name and Address:  Hessie Knows, MD  Relative Name and Phone Number:  Christhopher Cimorelli [phone]    Current Level of Care: Hospital Recommended Level of Care: [place] Prior Approval Number:    Date Approved/Denied:   PASRR Number: [id number]  Discharge Plan: SNF    Current Diagnoses: Patient Active Problem List   Diagnosis Date Noted  . S/P TKR (total knee replacement) using cement, right 02/11/2020  . Incisional hernia 09/10/2019  . Acute appendicitis 03/30/2019  . Status post total knee replacement using cement, left 10/08/2017  . Small bowel obstruction ([place]) 10/27/2016  . Insulin overdose 01/22/2016  . CKD (chronic kidney disease) stage 3, GFR 30-59 ml/min (HCC) 12/12/2015  . Diabetic peripheral neuropathy ([place]) 12/12/2015  . DM2 (diabetes mellitus, type 2) ([place]) 12/12/2015  . Small bowel obstruction due to adhesions ([place]) 12/12/2015  . Intestinal adhesions with obstruction ([place])   . Obesity, Class I, BMI 30-34.9 05/11/2015  . Mixed Alzheimer's and vascular dementia ([place]) 05/04/2015  . Uncontrolled diabetes mellitus type 2 without complications [id number]  . Cardiomyopathy, ischemic 11/04/2014  . Chronic systolic heart failure ([place]) 11/04/2014  . Atherosclerotic heart disease of native coronary artery without angina pectoris 11/04/2014  . SOB (shortness of breath) on exertion 04/26/2014  . Cardiomyopathy ([place]) 04/23/2014  . H/O cardiac catheterization 04/23/2014  . H/O degenerative disc disease 04/23/2014  . History of PTCA 04/23/2014  . HTN (hypertension)  04/23/2014  . Hyperlipidemia, unspecified 04/23/2014  . MI (myocardial infarction) ([place]) 04/23/2014  . OSA (obstructive sleep apnea) 04/23/2014    Orientation RESPIRATION BLADDER Height & Weight     Self,Time,Situation,Place  Normal External catheter Weight: 101.2 kg Height:  5\' 11"  (180.3 cm)  BEHAVIORAL SYMPTOMS/MOOD NEUROLOGICAL BOWEL NUTRITION STATUS      Continent Diet (Carb Modified)  AMBULATORY STATUS COMMUNICATION OF NEEDS Skin   Extensive Assist Verbally Surgical wounds                       Personal Care Assistance Level of Assistance  Dressing,Feeding,Bathing Bathing Assistance: Maximum assistance Feeding assistance: Limited assistance Dressing Assistance: Maximum assistance     Functional Limitations Info             SPECIAL CARE FACTORS FREQUENCY  OT (By licensed OT),PT (By licensed PT)                    Contractures Contractures Info: Not present    Additional Factors Info  Code Status,Allergies Code Status Info: Full Allergies Info: No known allergies           Current Medications (02/12/2020):  This is the current hospital active medication list Current Facility-Administered Medications  Medication Dose Route Frequency Provider Last Rate Last Admin  . 0.9 %  sodium chloride infusion   Intravenous Continuous Hessie Knows, MD 100 mL/hr at 02/12/20 0306 Infusion Verify at 02/12/20 0306  . acetaminophen (TYLENOL) tablet 1,000 mg  1,000 mg Oral Q6H Hessie Knows, MD   1,000 mg at 02/12/20 0537  . acetaminophen (TYLENOL) tablet 325-650 mg  325-650 mg Oral Q6H PRN Hessie Knows, MD      .  alum & mag hydroxide-simeth (MAALOX/MYLANTA) 200-200-20 MG/5ML suspension 30 mL  30 mL Oral Q4H PRN Hessie Knows, MD      . aspirin EC tablet 325 mg  325 mg Oral Q breakfast Hessie Knows, MD   325 mg at 02/12/20 0847  . atorvastatin (LIPITOR) tablet 40 mg  40 mg Oral Daily Hessie Knows, MD   40 mg at 02/12/20 0848  . bisacodyl (DULCOLAX) suppository 10  mg  10 mg Rectal Daily PRN Hessie Knows, MD      . carvedilol (COREG) tablet 3.125 mg  3.125 mg Oral BID WC Hessie Knows, MD   3.125 mg at 02/12/20 0847  . diazepam (VALIUM) tablet 5 mg  5 mg Oral Q12H PRN Hessie Knows, MD   5 mg at 02/12/20 0420  . diphenhydrAMINE (BENADRYL) 12.5 MG/5ML elixir 12.5-25 mg  12.5-25 mg Oral Q4H PRN Hessie Knows, MD   25 mg at 02/12/20 0713  . docusate sodium (COLACE) capsule 100 mg  100 mg Oral BID Hessie Knows, MD   100 mg at 02/12/20 0847  . donepezil (ARICEPT) tablet 10 mg  10 mg Oral Daily Hessie Knows, MD   10 mg at 02/12/20 0847  . doxepin (SINEQUAN) capsule 100 mg  100 mg Oral q morning - 10a Hessie Knows, MD   100 mg at 02/12/20 0847  . doxepin (SINEQUAN) capsule 200 mg  200 mg Oral QHS Hessie Knows, MD   200 mg at 02/11/20 2119  . enoxaparin (LOVENOX) injection 30 mg  30 mg Subcutaneous Q12H Hessie Knows, MD   30 mg at 02/12/20 0849  . famotidine (PEPCID) tablet 40 mg  40 mg Oral QHS Hessie Knows, MD   40 mg at 02/11/20 2121  . gemfibrozil (LOPID) tablet 600 mg  600 mg Oral BID AC Hessie Knows, MD   600 mg at 02/12/20 0847  . HYDROmorphone (DILAUDID) injection 0.5-1 mg  0.5-1 mg Intravenous Q4H PRN Hessie Knows, MD      . insulin aspart (novoLOG) injection 0-15 Units  0-15 Units Subcutaneous TID WC Hessie Knows, MD   3 Units at 02/11/20 1607  . insulin aspart (novoLOG) injection 15 Units  15 Units Subcutaneous TID WC Hessie Knows, MD   15 Units at 02/11/20 1607  . insulin glargine (LANTUS) injection 80 Units  80 Units Subcutaneous QHS Hessie Knows, MD      . isosorbide dinitrate (ISORDIL) tablet 30 mg  30 mg Oral TID Hessie Knows, MD   30 mg at 02/12/20 0848  . lisinopril (ZESTRIL) tablet 10 mg  10 mg Oral Daily Hessie Knows, MD   10 mg at 02/12/20 0848  . magnesium citrate solution 1 Bottle  1 Bottle Oral Once PRN Hessie Knows, MD      . magnesium hydroxide (MILK OF MAGNESIA) suspension 30 mL  30 mL Oral Daily PRN Hessie Knows, MD      .  meclizine (ANTIVERT) tablet 25 mg  25 mg Oral BID Hessie Knows, MD   25 mg at 02/12/20 0848  . menthol-cetylpyridinium (CEPACOL) lozenge 3 mg  1 lozenge Oral PRN Hessie Knows, MD       Or  . phenol (CHLORASEPTIC) mouth spray 1 spray  1 spray Mouth/Throat PRN Hessie Knows, MD      . metFORMIN (GLUCOPHAGE-XR) 24 hr tablet 1,500 mg  1,500 mg Oral QHS Hessie Knows, MD      . methocarbamol (ROBAXIN) tablet 500 mg  500 mg Oral Q6H PRN Hessie Knows, MD  Or  . methocarbamol (ROBAXIN) 500 mg in dextrose 5 % 50 mL IVPB  500 mg Intravenous Q6H PRN Hessie Knows, MD      . metoCLOPramide (REGLAN) tablet 5-10 mg  5-10 mg Oral Q8H PRN Hessie Knows, MD       Or  . metoCLOPramide (REGLAN) injection 5-10 mg  5-10 mg Intravenous Q8H PRN Hessie Knows, MD      . multivitamin with minerals tablet 1 tablet  1 tablet Oral Daily Hessie Knows, MD   1 tablet at 02/12/20 0848  . nitroGLYCERIN (NITROSTAT) SL tablet 0.4 mg  0.4 mg Sublingual Q5 min PRN Hessie Knows, MD      . ondansetron [facility]) tablet 4 mg  4 mg Oral Q6H PRN Hessie Knows, MD       Or  . ondansetron [facility]) injection 4 mg  4 mg Intravenous Q6H PRN Hessie Knows, MD      . oxyCODONE (Oxy IR/ROXICODONE) immediate release tablet 10-15 mg  10-15 mg Oral Q4H PRN Hessie Knows, MD   15 mg at 02/11/20 2152  . oxyCODONE (Oxy IR/ROXICODONE) immediate release tablet 5-10 mg  5-10 mg Oral Q4H PRN Hessie Knows, MD   5 mg at 02/12/20 0846  . pantoprazole (PROTONIX) EC tablet 40 mg  40 mg Oral Daily Hessie Knows, MD   40 mg at 02/12/20 0847  . pregabalin (LYRICA) capsule 300 mg  300 mg Oral BID Hessie Knows, MD   300 mg at 02/12/20 0848  . sodium chloride (OCEAN) 0.65 % nasal spray 1 spray  1 spray Each Nare BID PRN Hessie Knows, MD      . sodium chloride 0.9 % bolus 500 mL  500 mL Intravenous STAT Poggi, Marshall Cork, MD      . zolpidem (AMBIEN) tablet 5 mg  5 mg Oral QHS PRN Hessie Knows, MD         Discharge Medications: Please see discharge  summary for a list of discharge medications.  Relevant Imaging Results:  Relevant Lab Results:   Additional Information SS# [id number]  Shelbie Ammons, RN

## 2020-02-12 NOTE — Progress Notes (Signed)
[facility] Encounter Note  Patient: Todd Spencer / Admit Date: 02/11/2020 / Date of Encounter: 02/12/2020, 8:34 AM   Subjective: Patient is overall done fairly well overnight with no evidence of cardiovascular symptoms at all.  Patient has had telemetry and EKG showing sinus bradycardia otherwise unchanged.  During postoperative care the patient did have some short runs of accelerated junctional rhythm but these have dissipated.  There has been no symptoms of these issues and the patient has had no evidence of advanced heart block.  The patient does have known cardiovascular disease and previous LV systolic dysfunction and stenting for which she is on appropriate medication management.  Review of Systems: Positive for: Limb pain Negative for: Vision change, hearing change, syncope, dizziness, nausea, vomiting,diarrhea, bloody stool, stomach pain, cough, congestion, diaphoresis, urinary frequency, urinary pain,skin lesions, skin rashes Others previously listed  Objective: Telemetry: Sinus bradycardia Physical Exam: Blood pressure (!) 115/56, pulse 70, temperature 98.8 F (37.1 C), temperature source Oral, resp. rate 17, height 5\' 11"  (1.803 m), weight 101.2 kg, SpO2 93 %. Body mass index is 31.1 kg/m. General: Well developed, well nourished, in no acute distress. Head: Normocephalic, atraumatic, sclera non-icteric, no xanthomas, nares are without discharge. Neck: No apparent masses Lungs: Normal respirations with no wheezes, no rhonchi, no rales , no crackles   Heart: Regular rate and rhythm, normal S1 S2, no murmur, no rub, no gallop, PMI is normal size and placement, carotid upstroke normal without bruit, jugular venous pressure normal Abdomen: Soft, non-tender, non-distended with normoactive bowel sounds. No hepatosplenomegaly. Abdominal aorta is normal size without bruit Extremities: No edema, no clubbing, no cyanosis, no ulcers,  Peripheral: 2+ radial, 2+  femoral, 2+ dorsal pedal pulses Neuro: Alert and oriented. Moves all extremities spontaneously. Psych:  Responds to questions appropriately with a normal affect.   Intake/Output Summary (Last 24 hours) at 02/12/2020 0834 Last data filed at 02/12/2020 0742 Gross per 24 hour  Intake 3063.57 ml  Output 2375 ml  Net 688.57 ml    Inpatient Medications:  . acetaminophen  1,000 mg Oral Q6H  . aspirin  325 mg Oral Q breakfast  . atorvastatin  40 mg Oral Daily  . carvedilol  3.125 mg Oral BID WC  . docusate sodium  100 mg Oral BID  . donepezil  10 mg Oral Daily  . doxepin  100 mg Oral q morning - 10a  . doxepin  200 mg Oral QHS  . enoxaparin (LOVENOX) injection  30 mg Subcutaneous Q12H  . famotidine  40 mg Oral QHS  . gemfibrozil  600 mg Oral BID AC  . insulin aspart  0-15 Units Subcutaneous TID WC  . insulin aspart  15 Units Subcutaneous TID WC  . insulin glargine  80 Units Subcutaneous QHS  . isosorbide dinitrate  30 mg Oral TID  . lisinopril  10 mg Oral Daily  . meclizine  25 mg Oral BID  . metFORMIN  1,500 mg Oral QHS  . multivitamin with minerals  1 tablet Oral Daily  . pantoprazole  40 mg Oral Daily  . pregabalin  300 mg Oral BID   Infusions:  . sodium chloride 100 mL/hr at 02/12/20 0306  . methocarbamol (ROBAXIN) IV      Labs: Recent Labs    02/11/20 1346 02/12/20 0647  NA  --  137  K  --  4.4  CL  --  104  CO2  --  26  GLUCOSE  --  107*  BUN  --  18  CREATININE 1.08 1.01  CALCIUM  --  8.7*   No results for input(s): AST, ALT, ALKPHOS, BILITOT, PROT, ALBUMIN in the last 72 hours. Recent Labs    02/11/20 1346 02/12/20 0647  WBC 7.0 6.1  HGB 12.3* 11.3*  HCT 37.2* 34.2*  MCV 90.3 90.0  PLT 104* 106*   No results for input(s): CKTOTAL, CKMB, TROPONINI in the last 72 hours. Invalid input(s): POCBNP No results for input(s): HGBA1C in the last 72 hours.   Weights: Filed Weights   02/11/20 0650  Weight: 101.2 kg     Radiology/Studies:  DG Knee 1-2  Views Right  Result Date: 02/11/2020 CLINICAL DATA:  Status post total knee arthroplasty. EXAM: RIGHT KNEE - 1-2 VIEW COMPARISON:  CT right knee 07/31/2019 FINDINGS: Postoperative changes from right total knee arthroplasty identified. The hardware components are in anatomic alignment. No periprosthetic fracture or subluxation identified. IMPRESSION: Status post right total knee arthroplasty. Electronically Signed   By: Kerby Moors M.D.   On: 02/11/2020 10:08   CT ABDOMEN W CONTRAST  Result Date: 01/27/2020 CLINICAL DATA:  Draining wound. EXAM: CT ABDOMEN WITH CONTRAST TECHNIQUE: Multidetector CT imaging of the abdomen was performed using the standard protocol following bolus administration of intravenous contrast. CONTRAST:  13mL OMNIPAQUE IOHEXOL 300 MG/ML  SOLN COMPARISON:  03/30/2019. FINDINGS: Lower chest: No acute findings. Atherosclerotic calcification of the aorta, aortic valve and coronary arteries. Heart is at the upper limits of normal in size to mildly enlarged. No pericardial or pleural effusion. Distal esophagus is unremarkable. Hepatobiliary: Subcentimeter low-attenuation lesion in the left hepatic lobe is too small to characterize. Liver is otherwise unremarkable. Cholecystectomy. No biliary ductal dilatation. Pancreas: Negative. Spleen: Negative. Adrenals/Urinary Tract: Adrenal glands are unremarkable. 3.1 cm hyperattenuating exophytic lesion off the upper pole right kidney, stable. No contrast washout on nephrographic phase imaging. Lesion is stable over multiple prior exams dating back to 12/12/2015, indicative of a hyperdense cyst. Kidneys are otherwise unremarkable. Stomach/Bowel: Stomach and visualized portions of the small bowel and colon are unremarkable. Vascular/Lymphatic: Atherosclerotic calcification of the aorta without aneurysm. No pathologically enlarged lymph nodes. 10 mm aortocaval lymph node (2/50), unchanged. Other: Ventral hernia repair. Soft tissue thickening and a  possible small collection of fluid in the ventral midline abdominal wall, deep to a midline cutaneous marker. This collection is incompletely imaged and measures approximately 1.3 x 3.1 cm (5/13). No free fluid. Mesenteries and peritoneum are otherwise unremarkable. Musculoskeletal: Degenerative changes in the spine. Old right rib fractures. IMPRESSION: 1. Soft tissue thickening and possible small fluid collection (incompletely imaged), in the ventral abdominal wall, deep to a midline cutaneous marker. 2. Aortic atherosclerosis (ICD10-I70.0). Coronary artery calcification. Electronically Signed   By: Lorin Picket M.D.   On: 01/27/2020 13:08     Assessment and Recommendation  68 y.o. male with known coronary artery disease status post previous PCI and stent placement and myocardial infarction with cardiomyopathy on appropriate medication management without evidence of progressive heart block and/or symptomatic bradycardia and/or congestive heart failure or anginal symptoms at this time 1.  Continue orthopedic rehabilitation and treatments as necessary without restriction 2.  No further cardiac diagnostics necessary at this time due to patient doing well status post surgery without evidence of cardiovascular symptoms 3.  Reinstatement of previous appropriate medication management for cardiovascular disease including carvedilol, lisinopril, isosorbide, atorvastatin at previous dosages 4.  Follow-up with Dr. Saralyn Pilar for further treatment of cardiovascular issues. 5.  Call if further questions  Signed, Dois Davenport.D.  FACC
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
## 2020-02-12 NOTE — Progress Notes (Signed)
Physical Therapy Treatment Patient Details Name: Todd Spencer MRN: [id number] DOB: 1950/04/11 Today's Date: 02/12/2020    History of Present Illness Pt is a 69 yo male diagnosed with localized osteoarthritis of the right knee and is s/p elective R TKA.  PMH includes: HTN, angina with exertion, CAD, MI, CABG, CHF, DOE, depression, dementia, and OA.    PT Comments    Pt was pleasant and motivated to participate during the session and made notable progress towards goals this session.  Pt remained somewhat lethargic but much improved over prior session.  Pt noted with earlier soft BP and was found finishing a bolus of fluid upon entering room.  Pt's supine BP taken at 127/62 with HR 55 bpm and then in sitting BP taken at 135/74 with HR 68 bpm.  Pt put forth good effort during the session and required grossly less physical assistance with tasks and was able to take more steps with more upright posture.  Pt reported no adverse symptoms other than R knee pain during the session.  Pt will benefit from PT services in a SNF setting upon discharge to safely address deficits listed in patient problem list for decreased caregiver assistance and eventual return to PLOF.     Follow Up Recommendations  SNF     Equipment Recommendations  Other (comment) (TBD at next venue of care)    Recommendations for Other Services       Precautions / Restrictions Precautions Precautions: Fall Restrictions Weight Bearing Restrictions: Yes RLE Weight Bearing: Weight bearing as tolerated    Mobility  Bed Mobility Overal bed mobility: Needs Assistance Bed Mobility: Supine to Sit;Sit to Supine     Supine to sit: Min assist Sit to supine: Min assist   General bed mobility comments: Min A for RLE and trunk control  Transfers Overall transfer level: Needs assistance Equipment used: Rolling walker (2 wheeled) Transfers: Sit to/from Stand Sit to Stand: Min assist;From elevated surface         General  transfer comment: Min A and several rocking attempts required to stand from an elevated EOB with cues for hand and foot placement  Ambulation/Gait Ambulation/Gait assistance: Min assist Gait Distance (Feet): 5 Feet Assistive device: Rolling walker (2 wheeled) Gait Pattern/deviations: Step-to pattern;Shuffle;Antalgic;Trunk flexed Gait velocity: decreased   General Gait Details: Pt able to take several small, shuffling steps at the EOB with heavy lean on the RW and trunk flexed   Stairs             Wheelchair Mobility    Modified Rankin (Stroke Patients Only)       Balance Overall balance assessment: Needs assistance Sitting-balance support: Feet supported;Bilateral upper extremity supported Sitting balance-Leahy Scale: Good     Standing balance support: Bilateral upper extremity supported;During functional activity Standing balance-Leahy Scale: Fair Standing balance comment: Not attempted due to pt. lethargy, concern for pt and staff safety                            Cognition Arousal/Alertness: Lethargic Behavior During Therapy: WFL for tasks assessed/performed Overall Cognitive Status: Within Functional Limits for tasks assessed                                 General Comments: pt very lethargic, repeatedly falling asleep during session      Exercises Total Joint Exercises Ankle Circles/Pumps: AROM;Strengthening;Both;10 reps Quad Sets:  Strengthening;Right;5 reps;10 reps Hip ABduction/ADduction: AAROM;Right;10 reps;5 reps Straight Leg Raises: AAROM;Right;10 reps;5 reps Long Arc Quad: AROM;Strengthening;Right;10 reps;15 reps Knee Flexion: AROM;Strengthening;Right;10 reps;15 reps Goniometric ROM: R knee AROM: 12-78 Marching in Standing: AROM;Strengthening;Standing;5 reps;Both Other Exercises Other Exercises: HEP education and review for BLE APs, QS, and LAQs Other Exercises: Positioning review to promote R knee ext PROM Other  Exercises: Bed mobility, positioning, polar care. Limited pt educ re: p/s OT    General Comments        Pertinent Vitals/Pain Pain Assessment: 0-10 Pain Score: 7  Pain Location: R knee Pain Descriptors / Indicators: Aching;Sore Pain Intervention(s): Premedicated before session;Monitored during session    [place] expects to be discharged to:: Private residence Living Arrangements: Alone Available Help at Discharge: Friend(s);Available PRN/intermittently Type of Home: House Home Access: Stairs to enter Entrance Stairs-Rails: Left Home Layout: One level Home Equipment: Walker - standard Additional Comments: walking sick    Prior Function        Comments: Ind amb in home with furniture cruising, walking stick outdoors in community, 2 falls from LOB in last 6 months, Ind with ADLs   PT Goals (current goals can now be found in the care plan section) Acute Rehab PT Goals Patient Stated Goal: To walk better Progress towards PT goals: Progressing toward goals    Frequency    BID      PT Plan Current plan remains appropriate    Co-evaluation              AM-PAC PT "6 Clicks" Mobility   Outcome Measure  Help needed turning from your back to your side while in a flat bed without using bedrails?: A Little Help needed moving from lying on your back to sitting on the side of a flat bed without using bedrails?: A Little Help needed moving to and from a bed to a chair (including a wheelchair)?: A Lot Help needed standing up from a chair using your arms (e.g., wheelchair or bedside chair)?: A Lot Help needed to walk in hospital room?: Total Help needed climbing 3-5 steps with a railing? : Total 6 Click Score: 12    End of Session Equipment Utilized During Treatment: Gait belt Activity Tolerance: Patient tolerated treatment well Patient left: in bed;with call bell/phone within reach;with bed alarm set;with SCD's reapplied;Other (comment) (Polar care  donned to R knee) Nurse Communication: Mobility status;Weight bearing status PT Visit Diagnosis: Unsteadiness on feet (R26.81);History of falling (Z91.81);Other abnormalities of gait and mobility (R26.89);Muscle weakness (generalized) (M62.81);Pain Pain - Right/Left: Right Pain - part of body: Knee     Time: [id number] PT Time Calculation (min) (ACUTE ONLY): 41 min  Charges:  $Gait Training: 8-22 mins $Therapeutic Exercise: 8-22 mins $Therapeutic Activity: 8-22 mins                     D. Scott Traye Bates PT, DPT 02/12/20, 2:16 PM

## 2020-02-12 NOTE — Anesthesia Postprocedure Evaluation (Signed)
Anesthesia Post Note  Patient: Todd Spencer  Procedure(s) Performed: TOTAL KNEE ARTHROPLASTY (Right Knee)  Patient location during evaluation: Nursing Unit Anesthesia Type: Spinal Level of consciousness: oriented and awake and alert Pain management: pain level controlled Vital Signs Assessment: post-procedure vital signs reviewed and stable Respiratory status: spontaneous breathing, respiratory function stable and patient connected to nasal cannula oxygen Cardiovascular status: blood pressure returned to baseline and stable Postop Assessment: no headache, no backache and no apparent nausea or vomiting Anesthetic complications: no   No complications documented.   Last Vitals:  Vitals:   02/12/20 0909 02/12/20 1132  BP: 105/64 (!) 83/50  Pulse: 87 (!) 54  Resp:  16  Temp:  37.4 C  SpO2:  91%    Last Pain:  Vitals:   02/12/20 1132  TempSrc: Oral  PainSc:                  Arita Miss

## 2020-02-12 NOTE — Evaluation (Signed)
Occupational Therapy Evaluation Patient Details Name: Todd Spencer MRN: [id number] DOB: 1951/01/15 Today's Date: 02/12/2020    History of Present Illness Pt is a 69 yo male diagnosed with localized osteoarthritis of the right knee and is s/p elective R TKA.  PMH includes: HTN, angina with exertion, CAD, MI, CABG, CHF, DOE, depression, dementia, and OA.   Clinical Impression   Pt seen for OT evaluation this date, POD#1 from above surgery. Pt was independent in ADLs prior to surgery, living alone, reports using a walking stick when ambulating outdoors, no DME w/in the home. Today pt describes 8/10 pain in R knee. Pt was extremely fatigued and lethargic during today's session, on several occassions falling asleep mid-sentence. Required Max A for bed mobility due to this fatigue. Therapist attempted to provide pt educ re: DME/AE, polar care, home/routine modifications, but pt comprehension likely limited; additional instruction is indicated. Discussed pt presentation with nurse. She reports that he was agitated and complained of itchiness this AM, and was given both anti-anxiety medication + antihistamine; low level of alertness 2/2 this medication. Given pt's considerable level of pain, limited participation in OT program, and lack of a caregiver at home, recommend this pt continue to receive OT while hospitalized, than move to SNF for short-term rehabilitation.   Follow Up Recommendations  SNF    Equipment Recommendations       Recommendations for Other Services       Precautions / Restrictions Precautions Precautions: Fall Restrictions Weight Bearing Restrictions: Yes RLE Weight Bearing: Weight bearing as tolerated      Mobility Bed Mobility Overal bed mobility: Needs Assistance Bed Mobility: Supine to Sit;Sit to Supine     Supine to sit: Max assist Sit to supine: Max assist   General bed mobility comments: Max A, 2/2 lethargy    Transfers Overall transfer level: Needs  assistance Equipment used: Rolling walker (2 wheeled) Transfers: Sit to/from Stand Sit to Stand: Mod assist;From elevated surface         General transfer comment: Mod A and several rocking attempts required to stand from an elevated EOB with cues for hand and foot placement    Balance Overall balance assessment: Needs assistance Sitting-balance support: Feet supported;Bilateral upper extremity supported Sitting balance-Leahy Scale: Poor     Standing balance support: Bilateral upper extremity supported;During functional activity Standing balance-Leahy Scale: Fair Standing balance comment: Not attempted due to pt. lethargy, concern for pt and staff safety                           ADL either performed or assessed with clinical judgement   ADL Overall ADL's : Needs assistance/impaired                                     Functional mobility during ADLs: Cueing for sequencing;Cueing for safety       Vision Patient Visual Report: No change from baseline       Perception     Praxis      Pertinent Vitals/Pain Pain Assessment: 0-10 Pain Score: 8  Pain Location: R knee Pain Descriptors / Indicators: Aching;Sore Pain Intervention(s): Monitored during session;Limited activity within patient's tolerance;Ice applied;Repositioned     Hand Dominance Right   Extremity/Trunk Assessment Upper Extremity Assessment Upper Extremity Assessment: Generalized weakness   Lower Extremity Assessment Lower Extremity Assessment: Generalized weakness;RLE deficits/detail RLE: Unable to fully assess  due to pain       Communication Communication Communication: HOH   Cognition Arousal/Alertness: Lethargic;Suspect due to medications Behavior During Therapy: [facility] for tasks assessed/performed Overall Cognitive Status: Within Functional Limits for tasks assessed                                 General Comments: pt very lethargic, repeatedly falling  asleep during session   General Comments       Exercises Total Joint Exercises Ankle Circles/Pumps: AROM;Strengthening;Both;10 reps Quad Sets: Strengthening;Right;5 reps;10 reps Hip ABduction/ADduction: AAROM;Right;10 reps;5 reps Straight Leg Raises: AAROM;Right;10 reps;5 reps Long Arc Quad: AROM;Strengthening;Right;10 reps;15 reps Knee Flexion: AROM;Strengthening;Right;10 reps;15 reps Goniometric ROM: R knee AROM: 12-78 Marching in Standing: AROM;Strengthening;Right;10 reps;Standing Other Exercises Other Exercises: HEP education and review for BLE APs, QS, and LAQs Other Exercises: Positioning review to promote R knee ext PROM Other Exercises: Bed mobility, positioning, polar care. Limited pt educ re: p/s OT   Shoulder Instructions      Home Living Family/patient expects to be discharged to:: Private residence Living Arrangements: Alone Available Help at Discharge: Friend(s);Available PRN/intermittently Type of Home: House Home Access: Stairs to enter [employer] of Steps: 4 Entrance Stairs-Rails: Left Home Layout: One level     Bathroom Shower/Tub: Occupational psychologist: Handicapped height     Home Equipment: Environmental consultant - standard   Additional Comments: walking sick      Prior Functioning/Environment          Comments: Ind amb in home with furniture cruising, walking stick outdoors in community, 2 falls from LOB in last 6 months, Ind with ADLs        OT Problem List: Decreased strength;Impaired balance (sitting and/or standing);Decreased range of motion;Decreased activity tolerance;Pain;Decreased coordination      OT Treatment/Interventions: Self-care/ADL training;DME and/or AE instruction;Therapeutic activities;Balance training;Therapeutic exercise;Energy conservation;Patient/family education    OT Goals(Current goals can be found in the care plan section) Acute Rehab OT Goals Patient Stated Goal: To walk better OT Goal Formulation: With  patient Time For Goal Achievement: 02/26/20 Potential to Achieve Goals: Good ADL Goals Pt Will Perform Lower Body Bathing: with supervision;sit to/from stand Pt Will Perform Lower Body Dressing: with supervision;sit to/from stand Pt Will Transfer to Toilet: with min guard assist;stand pivot transfer (using LRAD)  OT Frequency: Min 1X/week   Barriers to D/C: Decreased caregiver support          Co-evaluation              AM-PAC OT "6 Clicks" Daily Activity     Outcome Measure Help from another person eating meals?: None Help from another person taking care of personal grooming?: None Help from another person toileting, which includes using toliet, bedpan, or urinal?: A Lot Help from another person bathing (including washing, rinsing, drying)?: A Lot Help from another person to put on and taking off regular upper body clothing?: None Help from another person to put on and taking off regular lower body clothing?: A Lot 6 Click Score: 18   End of Session Nurse Communication: Other (comment) (pt low level of responsiveness)  Activity Tolerance: Patient limited by lethargy Patient left: in bed;with call bell/phone within reach;with bed alarm set  OT Visit Diagnosis: Muscle weakness (generalized) (M62.81);Unsteadiness on feet (R26.81);Pain Pain - Right/Left: Right Pain - part of body: Knee                Time: [id number]  OT Time Calculation (min): 9 min Charges:  OT General Charges $OT Visit: 1 Visit OT Evaluation $OT Eval Low Complexity: 1 Low OT Treatments $Self Care/Home Management : 8-22 mins  Josiah Lobo, PhD, [place], OTR/L ascom [phone] 02/12/20, 1:24 PM
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
## 2020-02-12 NOTE — Discharge Instructions (Signed)

## 2020-02-13 ENCOUNTER — Inpatient Hospital Stay: Payer: Medicare HMO

## 2020-02-13 LAB — CBC WITH DIFFERENTIAL/PLATELET
Abs Immature Granulocytes: 0.02 10*3/uL (ref 0.00–0.07)
Basophils Absolute: 0 10*3/uL (ref 0.0–0.1)
Basophils Relative: 1 %
Eosinophils Absolute: 0.4 10*3/uL (ref 0.0–0.5)
Eosinophils Relative: 4 %
HCT: 31.1 % — ABNORMAL LOW (ref 39.0–52.0)
Hemoglobin: 10.6 g/dL — ABNORMAL LOW (ref 13.0–17.0)
Immature Granulocytes: 0 %
Lymphocytes Relative: 11 %
Lymphs Abs: 1 10*3/uL (ref 0.7–4.0)
MCH: 29.9 pg (ref 26.0–34.0)
MCHC: 34.1 g/dL (ref 30.0–36.0)
MCV: 87.6 fL (ref 80.0–100.0)
Monocytes Absolute: 1.1 10*3/uL — ABNORMAL HIGH (ref 0.1–1.0)
Monocytes Relative: 12 %
Neutro Abs: 6.4 10*3/uL (ref 1.7–7.7)
Neutrophils Relative %: 72 %
Platelets: 112 10*3/uL — ABNORMAL LOW (ref 150–400)
RBC: 3.55 MIL/uL — ABNORMAL LOW (ref 4.22–5.81)
RDW: 14.4 % (ref 11.5–15.5)
WBC: 8.9 10*3/uL (ref 4.0–10.5)
nRBC: 0 % (ref 0.0–0.2)

## 2020-02-13 LAB — GLUCOSE, CAPILLARY
Glucose-Capillary: 170 mg/dL — ABNORMAL HIGH (ref 70–99)
Glucose-Capillary: 169 mg/dL — ABNORMAL HIGH (ref 70–99)
Glucose-Capillary: 134 mg/dL — ABNORMAL HIGH (ref 70–99)
Glucose-Capillary: 137 mg/dL — ABNORMAL HIGH (ref 70–99)

## 2020-02-13 LAB — URINALYSIS, ROUTINE W REFLEX MICROSCOPIC
Bacteria, UA: NONE SEEN
Bilirubin Urine: NEGATIVE
Glucose, UA: NEGATIVE mg/dL
Hgb urine dipstick: NEGATIVE
Ketones, ur: 5 mg/dL — AB
Leukocytes,Ua: NEGATIVE
Nitrite: NEGATIVE
Protein, ur: NEGATIVE mg/dL
Specific Gravity, Urine: 1.016 (ref 1.005–1.030)
pH: 5 (ref 5.0–8.0)

## 2020-02-13 NOTE — Progress Notes (Addendum)
  Subjective: 2 Days Post-Op Procedure(s) (LRB): TOTAL KNEE ARTHROPLASTY (Right) Patient reports pain as increased this AM. He did not sleep well and states that his back is causing him pain in addition to the knee.  He is tired this AM. Plan is to go to [facility] after hospital stay. Negative for chest pain and shortness of breath Fever: no Gastrointestinal: negative for nausea and vomiting.   Patient has not had a bowel movement. He has not been passing gas.  Objective: Vital signs in last 24 hours: Temp:  [97.7 F (36.5 C)-99.4 F (37.4 C)] 99.3 F (37.4 C) (12/25 0738) Pulse Rate:  [54-96] 96 (12/25 0738) Resp:  [16-19] 19 (12/25 0738) BP: (83-136)/(50-73) 131/68 (12/25 0738) SpO2:  [91 %-100 %] 99 % (12/25 0738)  Intake/Output from previous day:  Intake/Output Summary (Last 24 hours) at 02/13/2020 1024 Last data filed at 02/13/2020 0533 Gross per 24 hour  Intake 742.8 ml  Output 1000 ml  Net -257.2 ml    Intake/Output this shift: No intake/output data recorded.  Labs: Recent Labs    02/11/20 1346 02/12/20 0647 02/13/20 0950  HGB 12.3* 11.3* 10.6*   Recent Labs    02/12/20 0647 02/13/20 0950  WBC 6.1 8.9  RBC 3.80* 3.55*  HCT 34.2* 31.1*  PLT 106* 112*   Recent Labs    02/11/20 1346 02/12/20 0647  NA  --  137  K  --  4.4  CL  --  104  CO2  --  26  BUN  --  18  CREATININE 1.08 1.01  GLUCOSE  --  107*  CALCIUM  --  8.7*   No results for input(s): LABPT, INR in the last 72 hours.   EXAM General - Patient is Appropriate, Oriented and but somnolent Extremity - Neurovascular intact Dorsiflexion/Plantar flexion intact Compartment soft, negative Homan's Dressing/Incision -Prevena in place with no drainage in canister Motor Function - intact, moving foot and toes  on exam.  Cardiovascular- Regular rate and rhythm, no murmurs/rubs/gallops Respiratory- minimal crackles in the lung bases, otherwise clear to ausculatation Gastrointestinal-  hypoactive bowel sounds, tender to deep palpation, abdomen somewhat distended  Assessment/Plan: 2 Days Post-Op Procedure(s) (LRB): TOTAL KNEE ARTHROPLASTY (Right) Active Problems:   S/P TKR (total knee replacement) using cement, right  Estimated body mass index is 31.1 kg/m as calculated from the following:   Height as of this encounter: 5\' 11"  (1.803 m).   Weight as of this encounter: 101.2 kg. Advance diet Up with therapy  Increased fatigue/lethargy this AM -UA with microscopy and culture -CXR   Awaiting bed placement to [facility].  DVT Prophylaxis - Lovenox, Ted hose and foot pumps Weight-Bearing as tolerated to right leg  Cassell Smiles, PA-C [facility] Orthopaedic Surgery 02/13/2020, 10:24 AM

## 2020-02-13 NOTE — Plan of Care (Signed)
  Problem: Education: Goal: Knowledge of General Education information will improve Description: Including pain rating scale, medication(s)/side effects and non-pharmacologic comfort measures 02/13/2020 1321 by Cristela Blue, RN Outcome: Progressing 02/13/2020 1321 by Cristela Blue, RN Outcome: Progressing   Problem: Health Behavior/Discharge Planning: Goal: Ability to manage health-related needs will improve 02/13/2020 1321 by Cristela Blue, RN Outcome: Progressing 02/13/2020 1321 by Cristela Blue, RN Outcome: Progressing   Problem: Clinical Measurements: Goal: Ability to maintain clinical measurements within normal limits will improve 02/13/2020 1321 by Cristela Blue, RN Outcome: Progressing 02/13/2020 1321 by Cristela Blue, RN Outcome: Progressing Goal: Will remain free from infection 02/13/2020 1321 by Cristela Blue, RN Outcome: Progressing 02/13/2020 1321 by Cristela Blue, RN Outcome: Progressing Goal: Diagnostic test results will improve 02/13/2020 1321 by Cristela Blue, RN Outcome: Progressing 02/13/2020 1321 by Cristela Blue, RN Outcome: Progressing Goal: Respiratory complications will improve 02/13/2020 1321 by Cristela Blue, RN Outcome: Progressing 02/13/2020 1321 by Cristela Blue, RN Outcome: Progressing Goal: Cardiovascular complication will be avoided 02/13/2020 1321 by Cristela Blue, RN Outcome: Progressing 02/13/2020 1321 by Cristela Blue, RN Outcome: Progressing   Problem: Activity: Goal: Risk for activity intolerance will decrease 02/13/2020 1321 by Cristela Blue, RN Outcome: Progressing 02/13/2020 1321 by Cristela Blue, RN Outcome: Progressing   Problem: Nutrition: Goal: Adequate nutrition will be maintained 02/13/2020 1321 by Cristela Blue, RN Outcome: Progressing 02/13/2020 1321 by Cristela Blue, RN Outcome: Progressing   Problem: Coping: Goal: Level of anxiety will decrease 02/13/2020 1321 by Cristela Blue, RN Outcome:  Progressing 02/13/2020 1321 by Cristela Blue, RN Outcome: Progressing   Problem: Elimination: Goal: Will not experience complications related to bowel motility 02/13/2020 1321 by Cristela Blue, RN Outcome: Progressing 02/13/2020 1321 by Cristela Blue, RN Outcome: Progressing Goal: Will not experience complications related to urinary retention 02/13/2020 1321 by Cristela Blue, RN Outcome: Progressing 02/13/2020 1321 by Cristela Blue, RN Outcome: Progressing   Problem: Pain Managment: Goal: General experience of comfort will improve 02/13/2020 1321 by Cristela Blue, RN Outcome: Progressing 02/13/2020 1321 by Cristela Blue, RN Outcome: Progressing   Problem: Safety: Goal: Ability to remain free from injury will improve 02/13/2020 1321 by Cristela Blue, RN Outcome: Progressing 02/13/2020 1321 by Cristela Blue, RN Outcome: Progressing   Problem: Skin Integrity: Goal: Risk for impaired skin integrity will decrease 02/13/2020 1321 by Cristela Blue, RN Outcome: Progressing 02/13/2020 1321 by Cristela Blue, RN Outcome: Progressing

## 2020-02-14 ENCOUNTER — Inpatient Hospital Stay: Payer: Medicare HMO

## 2020-02-14 LAB — COMPREHENSIVE METABOLIC PANEL
ALT: 16 U/L (ref 0–44)
AST: 18 U/L (ref 15–41)
Albumin: 3.4 g/dL — ABNORMAL LOW (ref 3.5–5.0)
Alkaline Phosphatase: 43 U/L (ref 38–126)
Anion gap: 10 (ref 5–15)
BUN: 28 mg/dL — ABNORMAL HIGH (ref 8–23)
CO2: 23 mmol/L (ref 22–32)
Calcium: 9.1 mg/dL (ref 8.9–10.3)
Chloride: 100 mmol/L (ref 98–111)
Creatinine, Ser: 1.15 mg/dL (ref 0.61–1.24)
GFR, Estimated: >60 mL/min (ref >60)
Glucose, Bld: 136 mg/dL — ABNORMAL HIGH (ref 70–99)
Potassium: 4.5 mmol/L (ref 3.5–5.1)
Sodium: 133 mmol/L — ABNORMAL LOW (ref 135–145)
Total Bilirubin: 1.2 mg/dL (ref 0.3–1.2)
Total Protein: 6.8 g/dL (ref 6.5–8.1)

## 2020-02-14 LAB — CBC
HCT: 32.4 % — ABNORMAL LOW (ref 39.0–52.0)
Hemoglobin: 11.4 g/dL — ABNORMAL LOW (ref 13.0–17.0)
MCH: 30.2 pg (ref 26.0–34.0)
MCHC: 35.2 g/dL (ref 30.0–36.0)
MCV: 85.7 fL (ref 80.0–100.0)
Platelets: 112 10*3/uL — ABNORMAL LOW (ref 150–400)
RBC: 3.78 MIL/uL — ABNORMAL LOW (ref 4.22–5.81)
RDW: 14.3 % (ref 11.5–15.5)
WBC: 10.1 10*3/uL (ref 4.0–10.5)
nRBC: 0 % (ref 0.0–0.2)

## 2020-02-14 LAB — GLUCOSE, CAPILLARY
Glucose-Capillary: 128 mg/dL — ABNORMAL HIGH (ref 70–99)
Glucose-Capillary: 132 mg/dL — ABNORMAL HIGH (ref 70–99)
Glucose-Capillary: 140 mg/dL — ABNORMAL HIGH (ref 70–99)
Glucose-Capillary: 103 mg/dL — ABNORMAL HIGH (ref 70–99)
Glucose-Capillary: 110 mg/dL — ABNORMAL HIGH (ref 70–99)

## 2020-02-14 LAB — URINE CULTURE: Culture: <10000 — AB

## 2020-02-14 MED ORDER — SODIUM CHLORIDE 0.9 % IV BOLUS
500.0000 mL | Freq: Once | INTRAVENOUS | Status: AC
  Administered 2020-02-14: 500 mL via INTRAVENOUS

## 2020-02-14 MED ORDER — SODIUM CHLORIDE 0.9 % IV SOLN
INTRAVENOUS | Status: DC
  Administered 2020-02-15: 100 mL/h via INTRAVENOUS

## 2020-02-14 MED ORDER — BISACODYL 10 MG RE SUPP
10.0000 mg | Freq: Once | RECTAL | Status: AC
  Administered 2020-02-14: 10 mg via RECTAL
  Filled 2020-02-14: qty 1

## 2020-02-14 NOTE — Progress Notes (Signed)
PT Cancellation Note  Patient Details Name: Todd Spencer MRN: [id number] DOB: 05-03-1950   Cancelled Treatment:    Reason Eval/Treat Not Completed: Patient at procedure or test/unavailable;Medical issues which prohibited therapy   Pt with soft BP with planned bolus.  Also awaiting transport for imaging.  Will hold session and continue as appropriate tomorrow.  Chesley Noon 02/14/2020, 11:47 AM

## 2020-02-14 NOTE — Progress Notes (Signed)
°   02/14/20 3818  Assess: MEWS Score  Temp 99.1 F (37.3 C)  BP (!) 81/55  Pulse Rate (!) 111  Resp 18  SpO2 94 %  O2 Device Room Air  Assess: MEWS Score  MEWS Temp 0  MEWS Systolic 1  MEWS Pulse 2  MEWS RR 0  MEWS LOC 0  MEWS Score 3  MEWS Score Color Yellow  Assess: if the MEWS score is Yellow or Red  Were vital signs taken at a resting state? Yes  Focused Assessment No change from prior assessment  Early Detection of Sepsis Score *See Row Information* Medium  MEWS guidelines implemented *See Row Information* Yes  Treat  Patients response to intervention Effective  Take Vital Signs  Increase Vital Sign Frequency  Yellow: Q 2hr X 2 then Q 4hr X 2, if remains yellow, continue Q 4hrs  Escalate  MEWS: Escalate Yellow: discuss with charge nurse/RN and consider discussing with provider and RRT  Notify: Charge Nurse/RN  Name of Charge Nurse/RN Notified Ples Specter RN  Document  Patient Outcome Other (Comment) (asymptomatic, stable, no interventions, increase vital sign frequency)  Progress note created (see row info) Yes

## 2020-02-14 NOTE — Progress Notes (Addendum)
  Subjective: 3 Days Post-Op Procedure(s) (LRB): TOTAL KNEE ARTHROPLASTY (Right) Patient reports pain as increased this AM, and localizes it to his abdomen primarily..   Plan is to go Skilled nursing facility after hospital stay. Negative for chest pain and shortness of breath Fever: no, but patient had a temp of 101.8 earlier prior to administration of 650 mg tylenol. Gastrointestinal: negative for nausea and vomiting.   Patient has not had a bowel movement. He reports passing gas.  Objective: Vital signs in last 24 hours: Temp:  [98.2 F (36.8 C)-101.8 F (38.8 C)] 98.2 F (36.8 C) (12/26 1016) Pulse Rate:  [87-111] 102 (12/26 1016) Resp:  [18-20] 18 (12/26 1016) BP: (78-154)/(54-77) 78/54 (12/26 1016) SpO2:  [92 %-94 %] 94 % (12/26 1016)  Intake/Output from previous day:  Intake/Output Summary (Last 24 hours) at 02/14/2020 1036 Last data filed at 02/14/2020 1011 Gross per 24 hour  Intake 0 ml  Output --  Net 0 ml    Intake/Output this shift: No intake/output data recorded.  Labs: Recent Labs    02/11/20 1346 02/12/20 0647 02/13/20 0950 02/14/20 0810  HGB 12.3* 11.3* 10.6* 11.4*   Recent Labs    02/13/20 0950 02/14/20 0810  WBC 8.9 10.1  RBC 3.55* 3.78*  HCT 31.1* 32.4*  PLT 112* 112*   Recent Labs    02/12/20 0647 02/14/20 0810  NA 137 133*  K 4.4 4.5  CL 104 100  CO2 26 23  BUN 18 28*  CREATININE 1.01 1.15  GLUCOSE 107* 136*  CALCIUM 8.7* 9.1   No results for input(s): LABPT, INR in the last 72 hours.   EXAM General - Patient is Alert, Appropriate and Oriented Extremity - Neurovascular intact Dorsiflexion/Plantar flexion intact Compartment soft Dressing/Incision -clean, dry, no drainage, Polar Care in place and working. Prevena in place with no drainage in the canister. Motor Function - intact, moving foot and toes well on exam.  Cardiovascular- tachycardic rate, regular rhythm, no m/r/g Respiratory- Lungs clear to auscultation  bilaterally Gastrointestinal- distended and tender to palpation, bowel sounds auscultated   Assessment/Plan: -3 Days Post-Op Procedure(s) (LRB): TOTAL KNEE ARTHROPLASTY (Right) Active Problems:   S/P TKR (total knee replacement) using cement, right  Estimated body mass index is 31.1 kg/m as calculated from the following:   Height as of this encounter: 5\' 11"  (1.803 m).   Weight as of this encounter: 101.2 kg. Advance diet Up with therapy  -fever Resolved with admin of Tylenol. Will continue to monitor.   -Abdominal pain Spoke with attending, Dr. Roland Rack, recommends Duclolax suppository at this time with enema in 1 hour if no BM. Abdominal xrays, flat and upright, obtained.   -hypotension 500 mL bolus NS ordered at this time 100 mL/hr infusion ordered, to be started after bolus   Addendum: Abd xray shows large volume of stool but no sign of obstruction.   DVT Prophylaxis - Lovenox, Ted hose and foot pumps Weight-Bearing as tolerated to right leg  Cassell Smiles, PA-C [facility] Orthopaedic Surgery 02/14/2020, 10:36 AM

## 2020-02-15 LAB — COMPREHENSIVE METABOLIC PANEL
ALT: 13 U/L (ref 0–44)
AST: 15 U/L (ref 15–41)
Albumin: 3.2 g/dL — ABNORMAL LOW (ref 3.5–5.0)
Alkaline Phosphatase: 42 U/L (ref 38–126)
Anion gap: 9 (ref 5–15)
BUN: 40 mg/dL — ABNORMAL HIGH (ref 8–23)
CO2: 24 mmol/L (ref 22–32)
Calcium: 8.5 mg/dL — ABNORMAL LOW (ref 8.9–10.3)
Chloride: 103 mmol/L (ref 98–111)
Creatinine, Ser: 1.46 mg/dL — ABNORMAL HIGH (ref 0.61–1.24)
GFR, Estimated: 51 mL/min — ABNORMAL LOW (ref >60)
Glucose, Bld: 121 mg/dL — ABNORMAL HIGH (ref 70–99)
Potassium: 4.1 mmol/L (ref 3.5–5.1)
Sodium: 136 mmol/L (ref 135–145)
Total Bilirubin: 1 mg/dL (ref 0.3–1.2)
Total Protein: 7.1 g/dL (ref 6.5–8.1)

## 2020-02-15 LAB — GLUCOSE, CAPILLARY
Glucose-Capillary: 115 mg/dL — ABNORMAL HIGH (ref 70–99)
Glucose-Capillary: 144 mg/dL — ABNORMAL HIGH (ref 70–99)
Glucose-Capillary: 125 mg/dL — ABNORMAL HIGH (ref 70–99)
Glucose-Capillary: 113 mg/dL — ABNORMAL HIGH (ref 70–99)

## 2020-02-15 LAB — CBC
HCT: 31.1 % — ABNORMAL LOW (ref 39.0–52.0)
Hemoglobin: 10.5 g/dL — ABNORMAL LOW (ref 13.0–17.0)
MCH: 30 pg (ref 26.0–34.0)
MCHC: 33.8 g/dL (ref 30.0–36.0)
MCV: 88.9 fL (ref 80.0–100.0)
Platelets: 119 10*3/uL — ABNORMAL LOW (ref 150–400)
RBC: 3.5 MIL/uL — ABNORMAL LOW (ref 4.22–5.81)
RDW: 14.9 % (ref 11.5–15.5)
WBC: 7.4 10*3/uL (ref 4.0–10.5)
nRBC: 0 % (ref 0.0–0.2)

## 2020-02-15 NOTE — Progress Notes (Addendum)
  Subjective: 4 Days Post-Op Procedure(s) (LRB): TOTAL KNEE ARTHROPLASTY (Right) Patient reports pain as well-controlled.   Patient is well, and has had no acute complaints or problems Plan is to go Skilled nursing facility after hospital stay. Negative for chest pain and shortness of breath Fever: no Gastrointestinal: negative for nausea and vomiting.   Patient has had a bowel movement.  Objective: Vital signs in last 24 hours: Temp:  [97.9 F (36.6 C)-101.8 F (38.8 C)] 97.9 F (36.6 C) (12/27 0715) Pulse Rate:  [65-111] 83 (12/27 0715) Resp:  [16-20] 17 (12/27 0715) BP: (78-141)/(47-74) 139/53 (12/27 0715) SpO2:  [94 %-100 %] 100 % (12/27 0715)  Intake/Output from previous day:  Intake/Output Summary (Last 24 hours) at 02/15/2020 0724 Last data filed at 02/15/2020 0340 Gross per 24 hour  Intake 282.03 ml  Output 1450 ml  Net -1167.97 ml    Intake/Output this shift: No intake/output data recorded.  Labs: Recent Labs    02/13/20 0950 02/14/20 0810  HGB 10.6* 11.4*   Recent Labs    02/13/20 0950 02/14/20 0810  WBC 8.9 10.1  RBC 3.55* 3.78*  HCT 31.1* 32.4*  PLT 112* 112*   Recent Labs    02/14/20 0810  NA 133*  K 4.5  CL 100  CO2 23  BUN 28*  CREATININE 1.15  GLUCOSE 136*  CALCIUM 9.1   No results for input(s): LABPT, INR in the last 72 hours.   EXAM General - Patient is Alert, Appropriate and Oriented Extremity - Neurovascular intact Dorsiflexion/Plantar flexion intact Compartment soft but slightly tender Dressing/Incision -clean, dry, Prevena in place and working  Motor Function - intact, moving foot and toes well on exam.  Cardiovascular- Regular rate and rhythm, no murmurs/rubs/gallops Respiratory- Lungs clear to auscultation bilaterally Gastrointestinal- soft and distended   Assessment/Plan: 4 Days Post-Op Procedure(s) (LRB): TOTAL KNEE ARTHROPLASTY (Right) Active Problems:   S/P TKR (total knee replacement) using cement,  right  Estimated body mass index is 31.1 kg/m as calculated from the following:   Height as of this encounter: 5\' 11"  (1.803 m).   Weight as of this encounter: 101.2 kg. Advance diet Up with therapy  CBC/CMP ordered.   Awaiting placement to SNF.  DVT Prophylaxis - Lovenox, Ted hose and foot pumps Weight-Bearing as tolerated to right leg  , PA-C [facility] Orthopaedic Surgery 02/15/2020, 7:24 AM

## 2020-02-15 NOTE — Progress Notes (Signed)
Occupational Therapy Treatment Patient Details Name: Todd Spencer MRN: [id number] DOB: March 09, 1950 Today's Date: 02/15/2020    History of present illness Pt is a 69 yo male diagnosed with localized osteoarthritis of the right knee and is s/p elective R TKA.  PMH includes: HTN, angina with exertion, CAD, MI, CABG, CHF, DOE, depression, dementia, and OA.   OT comments  Todd Spencer is much more alert and engaged than when seen for OT 3 days ago. He reports he is having no knee pain at present. Following encouragement, pt was willing to engage in grooming and therex activities, from sitting. He had just completed a PT session and declined further OOB activity. Therapist was able to demonstrate AE for lower body dressing and bathing; pt expressed interest and understanding and offered teach-back. Todd Spencer will benefit from ongoing OT while hospitalized, to increase ability to participate in functional mobility tasks. Post D/C, pt is agreeable to STR at a nearby SNF.    Follow Up Recommendations  SNF    Equipment Recommendations       Recommendations for Other Services      Precautions / Restrictions Precautions Precautions: Fall Restrictions Weight Bearing Restrictions: Yes RLE Weight Bearing: Weight bearing as tolerated       Mobility Bed Mobility Overal bed mobility: Needs Assistance Bed Mobility: Supine to Sit     Supine to sit: Mod assist     General bed mobility comments: pt received seated in recliner  Transfers Overall transfer level: Needs assistance Equipment used: Rolling walker (2 wheeled) Transfers: Sit to/from Stand Sit to Stand: Min assist;+2 physical assistance         General transfer comment: Did not attempt, as pt had just completed PT    Balance Overall balance assessment: Needs assistance Sitting-balance support: Feet supported;Bilateral upper extremity supported Sitting balance-Leahy Scale: Fair     Standing balance support: Bilateral  upper extremity supported;During functional activity Standing balance-Leahy Scale: Fair Standing balance comment: Not attempted, pt "worn out" following PT, and physcial therapist recommended against standing, given pt's unsteadiness and weakness                           ADL either performed or assessed with clinical judgement   ADL Overall ADL's : Needs assistance/impaired Eating/Feeding: Independent;Set up   Grooming: Wash/dry face;Wash/dry hands;Brushing hair;Set up;Modified independent                                       Vision Patient Visual Report: No change from baseline     Perception     Praxis      Cognition Arousal/Alertness: Awake/alert Behavior During Therapy: WFL for tasks assessed/performed;Impulsive Overall Cognitive Status: No family/caregiver present to determine baseline cognitive functioning                                 General Comments: meandering conversations        Exercises Total Joint Exercises Ankle Circles/Pumps: AROM;Strengthening;Both;10 reps Quad Sets: Strengthening;Right;5 reps;10 reps Hip ABduction/ADduction: AAROM;Right;10 reps;5 reps Straight Leg Raises: AAROM;Right;10 reps;5 reps Long Arc Quad: AROM;Strengthening;Right;10 reps;15 reps Knee Flexion: AROM;Strengthening;Right;10 reps;15 reps Goniometric ROM: 5-70 Marching in Standing: AROM;Strengthening;Standing;5 reps;Both Other Exercises Other Exercises: Educ re polar care, AEs, importance of OOB activity. Grooming, seated therex   Shoulder Instructions  General Comments      Pertinent Vitals/ Pain       Pain Assessment: No/denies pain Faces Pain Scale: Hurts little more Pain Location: Says he has "no more pain than usual." Pain Descriptors / Indicators: Aching;Sore Pain Intervention(s): Limited activity within patient's tolerance;Monitored during session;Repositioned;Ice applied  Home Living                                           Prior Functioning/Environment              Frequency  Min 1X/week        Progress Toward Goals  OT Goals(current goals can now be found in the care plan section)  Progress towards OT goals: Progressing toward goals  Acute Rehab OT Goals Patient Stated Goal: To walk better OT Goal Formulation: With patient Time For Goal Achievement: 02/26/20 Potential to Achieve Goals: Good  Plan Discharge plan remains appropriate    Co-evaluation                 AM-PAC OT "6 Clicks" Daily Activity     Outcome Measure   Help from another person eating meals?: None Help from another person taking care of personal grooming?: A Little Help from another person toileting, which includes using toliet, bedpan, or urinal?: A Lot Help from another person bathing (including washing, rinsing, drying)?: A Lot Help from another person to put on and taking off regular upper body clothing?: A Little Help from another person to put on and taking off regular lower body clothing?: A Lot 6 Click Score: 16    End of Session    OT Visit Diagnosis: Muscle weakness (generalized) (M62.81);Unsteadiness on feet (R26.81)   Activity Tolerance Patient limited by lethargy   Patient Left in chair;with call bell/phone within reach;with chair alarm set   Nurse Communication          Time: 1140-1200 OT Time Calculation (min): 20 min  Charges: OT General Charges $OT Visit: 1 Visit OT Treatments $Therapeutic Activity: 8-22 mins  Latina Craver, PhD, MS, OTR/L ascom [phone] 02/15/20, 1:56 PM

## 2020-02-15 NOTE — TOC Progression Note (Addendum)
Transition of Care [facility]) - Progression Note    Patient Details  Name: Todd Spencer MRN: [id number] Date of Birth: 09/16/1950  Transition of Care [facility]) CM/SW [place], LCSW Phone Number: 02/15/2020, 8:57 AM  Clinical Narrative:     Per TOC notes, patient only agreeable to [facility] for rehab. CSW spoke to [place] at [facility], she will review referral and let CSW know if they can accept patient.  1:13- Magda Paganini with [facility] made a bed offer. Call to [facility] who reported patient is not managed by [facility]. Updated Magda Paganini who will start authorization process and let CSW know when she gets [place], she reported PT would need to see patient before she can start auth. She said they need a PT note from within 3 days. Updated PT who saw patient this morning and will put in note.    Expected Discharge Plan: [place] Barriers to Discharge: No Barriers Identified  Expected Discharge Plan and Services Expected Discharge Plan: [place] Choice: [place] arrangements for the past 2 months: Single Family Home                                       Social Determinants of Health (SDOH) Interventions    Readmission Risk Interventions No flowsheet data found.

## 2020-02-15 NOTE — Progress Notes (Signed)
Physical Therapy Treatment Patient Details Name: Todd Spencer MRN: [id number] DOB: 05-Nov-1950 Today's Date: 02/15/2020    History of Present Illness Pt is a 69 yo male diagnosed with localized osteoarthritis of the right knee and is s/p elective R TKA.  PMH includes: HTN, angina with exertion, CAD, MI, CABG, CHF, DOE, depression, dementia, and OA.    PT Comments    Ready for session. Participated in exercises as described below.  Mod a to EOB.  Stood with min a x 2 for safety and hesitant steps to recliner at bedside with heavy reliance on walker.  Pt with difficulty following cues at times and somewhat impulsive.  Unable to progress gait.   Follow Up Recommendations  SNF     Equipment Recommendations       Recommendations for Other Services       Precautions / Restrictions Precautions Precautions: Fall Restrictions Weight Bearing Restrictions: Yes RLE Weight Bearing: Weight bearing as tolerated    Mobility  Bed Mobility Overal bed mobility: Needs Assistance Bed Mobility: Supine to Sit     Supine to sit: Mod assist        Transfers Overall transfer level: Needs assistance Equipment used: Rolling walker (2 wheeled) Transfers: Sit to/from Stand Sit to Stand: Min assist;+2 physical assistance            Ambulation/Gait Ambulation/Gait assistance: Min assist;+2 physical assistance Gait Distance (Feet): 3 Feet Assistive device: Rolling walker (2 wheeled) Gait Pattern/deviations: Step-to pattern;Shuffle;Antalgic;Trunk flexed Gait velocity: decreased   General Gait Details: Pt able to take several small, shuffling steps at the EOB with heavy lean on the RW and trunk flexed   Stairs             Wheelchair Mobility    Modified Rankin (Stroke Patients Only)       Balance Overall balance assessment: Needs assistance Sitting-balance support: Feet supported;Bilateral upper extremity supported Sitting balance-Leahy Scale: Fair     Standing  balance support: Bilateral upper extremity supported;During functional activity Standing balance-Leahy Scale: Fair                              Cognition Arousal/Alertness: Awake/alert Behavior During Therapy: WFL for tasks assessed/performed;Impulsive Overall Cognitive Status: No family/caregiver present to determine baseline cognitive functioning                                 General Comments: generally unorganized with difficulty at times following directions      Exercises Total Joint Exercises Ankle Circles/Pumps: AROM;Strengthening;Both;10 reps Quad Sets: Strengthening;Right;5 reps;10 reps Hip ABduction/ADduction: AAROM;Right;10 reps;5 reps Straight Leg Raises: AAROM;Right;10 reps;5 reps Long Arc Quad: AROM;Strengthening;Right;10 reps;15 reps Knee Flexion: AROM;Strengthening;Right;10 reps;15 reps Goniometric ROM: 5-70 Marching in Standing: AROM;Strengthening;Standing;5 reps;Both    General Comments        Pertinent Vitals/Pain Pain Assessment: Faces Faces Pain Scale: Hurts little more Pain Location: R knee Pain Descriptors / Indicators: Aching;Sore Pain Intervention(s): Limited activity within patient's tolerance;Monitored during session;Repositioned;Ice applied    Home Living                      Prior Function            PT Goals (current goals can now be found in the care plan section) Progress towards PT goals: Progressing toward goals    Frequency    BID  PT Plan Current plan remains appropriate    Co-evaluation              AM-PAC PT "6 Clicks" Mobility   Outcome Measure  Help needed turning from your back to your side while in a flat bed without using bedrails?: A Little Help needed moving from lying on your back to sitting on the side of a flat bed without using bedrails?: A Lot Help needed moving to and from a bed to a chair (including a wheelchair)?: A Lot Help needed standing up from a chair  using your arms (e.g., wheelchair or bedside chair)?: A Lot Help needed to walk in hospital room?: A Lot Help needed climbing 3-5 steps with a railing? : Total 6 Click Score: 12    End of Session Equipment Utilized During Treatment: Gait belt Activity Tolerance: Patient tolerated treatment well Patient left: with call bell/phone within reach;Other (comment);in chair;with chair alarm set Nurse Communication: Mobility status;Weight bearing status Pain - Right/Left: Right Pain - part of body: Knee     Time: 1122-1140 PT Time Calculation (min) (ACUTE ONLY): 18 min  Charges:  $Therapeutic Exercise: 8-22 mins                    Danielle Dess, PTA 02/15/20, 1:24 PM

## 2020-02-15 NOTE — Progress Notes (Signed)
Physical Therapy Treatment Patient Details Name: Todd Spencer MRN: [id number] DOB: Jul 22, 1950 Today's Date: 02/15/2020    History of Present Illness Pt is a 69 yo male diagnosed with localized osteoarthritis of the right knee and is s/p elective R TKA.  PMH includes: HTN, angina with exertion, CAD, MI, CABG, CHF, DOE, depression, dementia, and OA.    PT Comments    Pt in chair trying to put feet down to get back to bed.  Assisted with min a x 2 to stand and take hesitant steps back to bed.  Mod a x 2 to return to supine.  Pt with improved steps this pm but when asked to progress gait he stated he needed to sit.    SNF remains appropriate for discharge.  Encourage +2 for gait given heavy reliance on walker and impulsivity/difficulty following cues at times.    Follow Up Recommendations  SNF     Equipment Recommendations       Recommendations for Other Services       Precautions / Restrictions Precautions Precautions: Fall Restrictions Weight Bearing Restrictions: Yes RLE Weight Bearing: Weight bearing as tolerated    Mobility  Bed Mobility Overal bed mobility: Needs Assistance Bed Mobility: Sit to Supine     Supine to sit: Mod assist Sit to supine: Min assist;+2 for physical assistance   General bed mobility comments: pt received seated in recliner  Transfers Overall transfer level: Needs assistance Equipment used: Rolling walker (2 wheeled) Transfers: Sit to/from Stand Sit to Stand: Min assist;+2 physical assistance         General transfer comment: Did not attempt, as pt had just completed PT  Ambulation/Gait Ambulation/Gait assistance: Min assist;+2 physical assistance Gait Distance (Feet): 3 Feet Assistive device: Rolling walker (2 wheeled) Gait Pattern/deviations: Step-to pattern;Shuffle;Antalgic;Trunk flexed Gait velocity: decreased   General Gait Details: Pt able to take several small, shuffling steps at the EOB with heavy lean on the RW and  trunk flexed   Stairs             Wheelchair Mobility    Modified Rankin (Stroke Patients Only)       Balance Overall balance assessment: Needs assistance Sitting-balance support: Feet supported;Bilateral upper extremity supported Sitting balance-Leahy Scale: Fair     Standing balance support: Bilateral upper extremity supported;During functional activity Standing balance-Leahy Scale: Fair Standing balance comment: Not attempted, pt "worn out" following PT, and physcial therapist recommended against standing, given pt's unsteadiness and weakness                            Cognition Arousal/Alertness: Awake/alert Behavior During Therapy: WFL for tasks assessed/performed;Impulsive Overall Cognitive Status: No family/caregiver present to determine baseline cognitive functioning                                 General Comments: appears to have some cognitive deficits, more orgainzed but trying to get up on his own upon arrival      Exercises Total Joint Exercises Ankle Circles/Pumps: AROM;Strengthening;Both;10 reps Quad Sets: Strengthening;Right;5 reps;10 reps Hip ABduction/ADduction: AAROM;Right;10 reps;5 reps Straight Leg Raises: AAROM;Right;10 reps;5 reps Long Arc Quad: AROM;Strengthening;Right;10 reps;15 reps Knee Flexion: AROM;Strengthening;Right;10 reps;15 reps Goniometric ROM: 5-70 Marching in Standing: AROM;Strengthening;Standing;5 reps;Both Other Exercises Other Exercises: Educ re polar care, AEs, importance of OOB activity. Grooming, seated therex    General Comments  Pertinent Vitals/Pain Pain Assessment: Faces Faces Pain Scale: Hurts little more Pain Location: Says he has "no more pain than usual." Pain Descriptors / Indicators: Aching;Sore Pain Intervention(s): Limited activity within patient's tolerance;Monitored during session;Ice applied    Home Living                      Prior Function             PT Goals (current goals can now be found in the care plan section) Acute Rehab PT Goals Patient Stated Goal: To walk better Progress towards PT goals: Progressing toward goals    Frequency    BID      PT Plan Current plan remains appropriate    Co-evaluation              AM-PAC PT "6 Clicks" Mobility   Outcome Measure  Help needed turning from your back to your side while in a flat bed without using bedrails?: A Little Help needed moving from lying on your back to sitting on the side of a flat bed without using bedrails?: A Lot Help needed moving to and from a bed to a chair (including a wheelchair)?: A Lot Help needed standing up from a chair using your arms (e.g., wheelchair or bedside chair)?: A Lot Help needed to walk in hospital room?: A Lot Help needed climbing 3-5 steps with a railing? : Total 6 Click Score: 12    End of Session Equipment Utilized During Treatment: Gait belt Activity Tolerance: Patient tolerated treatment well Patient left: in bed;with bed alarm set;with call bell/phone within reach Nurse Communication: Mobility status;Weight bearing status Pain - Right/Left: Right Pain - part of body: Knee     Time: 6546-5035 PT Time Calculation (min) (ACUTE ONLY): 23 min  Charges:  $Gait Training: 8-22 mins $Therapeutic Exercise: 8-22 mins $Therapeutic Activity: 8-22 mins                    Chesley Noon, PTA 02/15/20, 2:18 PM

## 2020-02-16 DIAGNOSIS — I5022 Chronic systolic (congestive) heart failure: Secondary | ICD-10-CM | POA: Diagnosis not present

## 2020-02-16 DIAGNOSIS — N179 Acute kidney failure, unspecified: Secondary | ICD-10-CM | POA: Diagnosis present

## 2020-02-16 DIAGNOSIS — I159 Secondary hypertension, unspecified: Secondary | ICD-10-CM

## 2020-02-16 LAB — COMPREHENSIVE METABOLIC PANEL
ALT: 15 U/L (ref 0–44)
AST: 23 U/L (ref 15–41)
Albumin: 2.9 g/dL — ABNORMAL LOW (ref 3.5–5.0)
Alkaline Phosphatase: 43 U/L (ref 38–126)
Anion gap: 10 (ref 5–15)
BUN: 32 mg/dL — ABNORMAL HIGH (ref 8–23)
CO2: 24 mmol/L (ref 22–32)
Calcium: 8.8 mg/dL — ABNORMAL LOW (ref 8.9–10.3)
Chloride: 105 mmol/L (ref 98–111)
Creatinine, Ser: 1.21 mg/dL (ref 0.61–1.24)
GFR, Estimated: >60 mL/min (ref >60)
Glucose, Bld: 92 mg/dL (ref 70–99)
Potassium: 4.1 mmol/L (ref 3.5–5.1)
Sodium: 139 mmol/L (ref 135–145)
Total Bilirubin: 1 mg/dL (ref 0.3–1.2)
Total Protein: 6.7 g/dL (ref 6.5–8.1)

## 2020-02-16 LAB — GLUCOSE, CAPILLARY
Glucose-Capillary: 91 mg/dL (ref 70–99)
Glucose-Capillary: 115 mg/dL — ABNORMAL HIGH (ref 70–99)
Glucose-Capillary: 129 mg/dL — ABNORMAL HIGH (ref 70–99)
Glucose-Capillary: 120 mg/dL — ABNORMAL HIGH (ref 70–99)

## 2020-02-16 LAB — SARS CORONAVIRUS 2 (TAT 6-24 HRS): SARS Coronavirus 2: NEGATIVE
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
## 2020-02-16 NOTE — TOC Progression Note (Addendum)
Transition of Care [facility]) - Progression Note    Patient Details  Name: Todd Spencer MRN: [id number] Date of Birth: 14-Jun-1950  [facility] CM/SW Contact  Liliana Cline, LCSW Phone Number: 02/16/2020, 12:59 PM  Clinical Narrative:   CSW spoke to [place] at [facility]. She said they may get insurance authorization today, she will let CSW know when she gets authorization. CSW asked MD for rapid COVID test.  2:25- Called Verlon Au at [facility]. She reported they have insurance auth and will have a bed for patient tomorrow. They would like patient to come early in the day tomorrow if possible, CSW updated patient, RN, and MD.   Expected Discharge Plan: Skilled Nursing Facility Barriers to Discharge: No Barriers Identified  Expected Discharge Plan and Services Expected Discharge Plan: Skilled Nursing Facility     Post Acute Care Choice: Skilled Nursing Facility Living arrangements for the past 2 months: Single Family Home                                       Social Determinants of Health (SDOH) Interventions    Readmission Risk Interventions No flowsheet data found.

## 2020-02-16 NOTE — Progress Notes (Signed)
Physical Therapy Treatment Patient Details Name: Todd Spencer MRN: [id number] DOB: 22-Jun-1950 Today's Date: 02/16/2020    History of Present Illness Pt is a 69 yo male diagnosed with localized osteoarthritis of the right knee and is s/p elective R TKA.  PMH includes: HTN, angina with exertion, CAD, MI, CABG, CHF, DOE, depression, dementia, and OA.    PT Comments    Pt was supine in bed with Hob elevated ~ 20 degrees. He agrees to PT session and is motivated to improve + wanted to attempt ambulation. Pt required assistance to achieve EOB short sit. Sat EOB x several minutes while performing knee ROM exercises. Was able to achieve AAROM to 80 degrees flexion with lacking several degrees extension. Overall is improving. He was able to stand and ambulate to BR. Overall therapy limited today by frequent urgent BMs. He will benefit from SNF at DC to address deficits while improving independence. ZAcute PT will return next date to continue current POC. RN aware pt is in BR on toilet having BM. Will use call bell to reach out to staff when done with BM.     Follow Up Recommendations  SNF     Equipment Recommendations  Other (comment) (defer to next level of care)    Recommendations for Other Services       Precautions / Restrictions Precautions Precautions: Fall Restrictions Weight Bearing Restrictions: Yes RLE Weight Bearing: Weight bearing as tolerated    Mobility  Bed Mobility Overal bed mobility: Needs Assistance Bed Mobility: Supine to Sit     Supine to sit: Min assist     General bed mobility comments: Min assist to exit L side of bed with increased time to perform.  Transfers Overall transfer level: Needs assistance Equipment used: Rolling walker (2 wheeled) Transfers: Sit to/from Stand Sit to Stand: Min assist;From elevated surface         General transfer comment: pt performed STS from EOB with min assist. bed height was  elevated.  Ambulation/Gait Ambulation/Gait assistance: Min guard Gait Distance (Feet): 25 Feet Assistive device: Rolling walker (2 wheeled) Gait Pattern/deviations: Step-to pattern;Trunk flexed;Antalgic Gait velocity: decreased   General Gait Details: Pt was able to ambulate to BR with slow step to pattern. no LOB.     Balance Overall balance assessment: Needs assistance Sitting-balance support: Feet supported;Bilateral upper extremity supported Sitting balance-Leahy Scale: Good Sitting balance - Comments: no LOB sitting EOB   Standing balance support: Bilateral upper extremity supported;During functional activity Standing balance-Leahy Scale: Fair Standing balance comment: poor safety awareness however was steady on feet     Cognition Arousal/Alertness: Awake/alert Behavior During Therapy: WFL for tasks assessed/performed;Impulsive Overall Cognitive Status: No family/caregiver present to determine baseline cognitive functioning        General Comments: Pt is A and O but has poor safety awareness and is slightly impulsive at times      Exercises Total Joint Exercises Knee Flexion: AROM;AAROM;10 reps;Seated Goniometric ROM: 3-80 degrees        Pertinent Vitals/Pain Pain Assessment: 0-10 Pain Score: 3  Faces Pain Scale: Hurts a little bit Pain Location: knee Pain Descriptors / Indicators: Aching;Sore Pain Intervention(s): Limited activity within patient's tolerance;Monitored during session;Premedicated before session;Repositioned;Ice applied           PT Goals (current goals can now be found in the care plan section) Acute Rehab PT Goals Patient Stated Goal: To walk better Progress towards PT goals: Progressing toward goals    Frequency    BID  PT Plan Current plan remains appropriate       AM-PAC PT "6 Clicks" Mobility   Outcome Measure  Help needed turning from your back to your side while in a flat bed without using bedrails?: A Little Help  needed moving from lying on your back to sitting on the side of a flat bed without using bedrails?: A Lot Help needed moving to and from a bed to a chair (including a wheelchair)?: A Lot Help needed standing up from a chair using your arms (e.g., wheelchair or bedside chair)?: A Little Help needed to walk in hospital room?: A Little Help needed climbing 3-5 steps with a railing? : A Lot 6 Click Score: 15    End of Session Equipment Utilized During Treatment: Gait belt Activity Tolerance: Patient tolerated treatment well Patient left: in chair;with call bell/phone within reach;with chair alarm set Nurse Communication: Mobility status;Weight bearing status PT Visit Diagnosis: Unsteadiness on feet (R26.81);History of falling (Z91.81);Other abnormalities of gait and mobility (R26.89);Muscle weakness (generalized) (M62.81);Pain Pain - Right/Left: Right Pain - part of body: Knee     Time: [id number] PT Time Calculation (min) (ACUTE ONLY): 27 min  Charges:  $Gait Training: 8-22 mins $Therapeutic Exercise: 8-22 mins                     Julaine Fusi PTA 02/16/20, 4:50 PM

## 2020-02-16 NOTE — Progress Notes (Signed)
Physical Therapy Treatment Patient Details Name: Todd Spencer MRN: [id number] DOB: [birth year] Today's Date: 02/16/2020    History of Present Illness Pt is a 69 yo male diagnosed with localized osteoarthritis of the right knee and is s/p elective R TKA.  PMH includes: HTN, angina with exertion, CAD, MI, CABG, CHF, DOE, depression, dementia, and OA.    PT Comments    Pt was supine in bed on bedpan upon arriving. Per Hydrologist, has had frequent BMs this morning. He was agreeable to PT session and attemptig OOB activity. Pt is A and O x 4 however sl;ightly HOH and has occasions of poor insight of deficits/safety. Slightly impulsive throughout. Overall is able to follow commands with increased time. Pt severely limited this session by have three BMs. Pt was able to exit L side of bed, stand and take steps however gait distances limited by needing to have another BM. Pt will need rehab at DC. Acute PT will continue to follow per POC and progress as able per POC.    Follow Up Recommendations  SNF     Equipment Recommendations  Other (comment) (defer to next level of care)    Recommendations for Other Services       Precautions / Restrictions Precautions Precautions: Fall Restrictions Weight Bearing Restrictions: Yes RLE Weight Bearing: Weight bearing as tolerated    Mobility  Bed Mobility Overal bed mobility: Needs Assistance Bed Mobility: Supine to Sit     Supine to sit: Mod assist     General bed mobility comments: Pt required mod asist to achieve EOB short sit from supine. Increased time to perform with Vcs throughout for technique/sequencing  Transfers Overall transfer level: Needs assistance Equipment used: Rolling walker (2 wheeled) Transfers: Sit to/from Stand Sit to Stand: Mod assist;From elevated surface         General transfer comment: Pt performed STS EOB 2 x , recliner 1 x, and from BSC 2x. session greatly limited by several BMs. pt had two BMs during session  and 3 in last hour  Ambulation/Gait Ambulation/Gait assistance: Min assist;+2 safety/equipment Gait Distance (Feet): 5 Feet Assistive device: Rolling walker (2 wheeled) Gait Pattern/deviations: Step-to pattern;Shuffle;Antalgic;Trunk flexed Gait velocity: decreased   General Gait Details: pt was able to ambulate with RW + slow step to antalgic sequencing.       Balance Overall balance assessment: Needs assistance Sitting-balance support: Feet supported;Bilateral upper extremity supported Sitting balance-Leahy Scale: Good Sitting balance - Comments: no LOB sitting EOB   Standing balance support: Bilateral upper extremity supported;During functional activity Standing balance-Leahy Scale: Fair Standing balance comment: poor safety awareness however was steady on feet       Cognition Arousal/Alertness: Awake/alert Behavior During Therapy: WFL for tasks assessed/performed;Impulsive Overall Cognitive Status: No family/caregiver present to determine baseline cognitive functioning      General Comments: pt is A and O x 4 however has episodes of poor safety awareness and poor insight to deficits. imulsive at times             Pertinent Vitals/Pain Pain Assessment: 0-10 Pain Score: 6  Faces Pain Scale: Hurts little more Pain Location: Says he has "no more pain than usual." Pain Descriptors / Indicators: Aching;Sore Pain Intervention(s): Limited activity within patient's tolerance;Monitored during session;Premedicated before session;Repositioned;Ice applied           PT Goals (current goals can now be found in the care plan section) Acute Rehab PT Goals Patient Stated Goal: To walk better Progress towards PT goals:  Progressing toward goals    Frequency    BID      PT Plan Current plan remains appropriate       AM-PAC PT "6 Clicks" Mobility   Outcome Measure  Help needed turning from your back to your side while in a flat bed without using bedrails?: A  Little Help needed moving from lying on your back to sitting on the side of a flat bed without using bedrails?: A Lot Help needed moving to and from a bed to a chair (including a wheelchair)?: A Lot Help needed standing up from a chair using your arms (e.g., wheelchair or bedside chair)?: A Little Help needed to walk in hospital room?: A Little Help needed climbing 3-5 steps with a railing? : A Lot 6 Click Score: 15    End of Session Equipment Utilized During Treatment: Gait belt Activity Tolerance: Patient tolerated treatment well Patient left: in chair;with call bell/phone within reach;with chair alarm set Nurse Communication: Mobility status;Weight bearing status PT Visit Diagnosis: Unsteadiness on feet (R26.81);History of falling (Z91.81);Other abnormalities of gait and mobility (R26.89);Muscle weakness (generalized) (M62.81);Pain Pain - Right/Left: Right Pain - part of body: Knee     Time: 0920-1000 PT Time Calculation (min) (ACUTE ONLY): 40 min  Charges:  $Therapeutic Activity: 38-52 mins                     Jetta Lout PTA 02/16/20, 11:04 AM

## 2020-02-16 NOTE — Progress Notes (Signed)
  Subjective: 5 Days Post-Op Procedure(s) (LRB): TOTAL KNEE ARTHROPLASTY (Right) Patient reports pain as well-controlled.   Patient reports a fever overnight but states that it broke and he is feeling better. Plan is to go Skilled nursing facility after hospital stay. Negative for chest pain and shortness of breath Fever: not currently, but overnight temp of 101.2 noted.  Gastrointestinal: negative for nausea and vomiting.   Patient has had a bowel movement.  Objective: Vital signs in last 24 hours: Temp:  [98.2 F (36.8 C)-101.2 F (38.4 C)] 99.3 F (37.4 C) (12/28 0728) Pulse Rate:  [74-95] 82 (12/28 0728) Resp:  [16-20] 16 (12/28 0728) BP: (102-155)/(56-87) 154/86 (12/28 0728) SpO2:  [93 %-98 %] 98 % (12/28 0728)  Intake/Output from previous day:  Intake/Output Summary (Last 24 hours) at 02/16/2020 0825 Last data filed at 02/16/2020 0357 Gross per 24 hour  Intake 120 ml  Output 3100 ml  Net -2980 ml    Intake/Output this shift: No intake/output data recorded.  Labs: Recent Labs    02/13/20 0950 02/14/20 0810 02/15/20 0713  HGB 10.6* 11.4* 10.5*   Recent Labs    02/14/20 0810 02/15/20 0713  WBC 10.1 7.4  RBC 3.78* 3.50*  HCT 32.4* 31.1*  PLT 112* 119*   Recent Labs    02/14/20 0810 02/15/20 0713  NA 133* 136  K 4.5 4.1  CL 100 103  CO2 23 24  BUN 28* 40*  CREATININE 1.15 1.46*  GLUCOSE 136* 121*  CALCIUM 9.1 8.5*   No results for input(s): LABPT, INR in the last 72 hours.   EXAM General - Patient is Alert, Appropriate and Oriented Extremity - Neurovascular intact Dorsiflexion/Plantar flexion intact Compartment soft Dressing/Incision -Prevena in place, no drainage in canister  Motor Function - intact, moving foot and toes well on exam.  Cardiovascular- Regular rate and rhythm, no murmurs/rubs/gallops Respiratory- Lungs clear to auscultation bilaterally Gastrointestinal- soft, mildly tender with active bowel sounds   Assessment/Plan: 5  Days Post-Op Procedure(s) (LRB): TOTAL KNEE ARTHROPLASTY (Right) Active Problems:   S/P TKR (total knee replacement) using cement, right  Estimated body mass index is 31.1 kg/m as calculated from the following:   Height as of this encounter: 5\' 11"  (1.803 m).   Weight as of this encounter: 101.2 kg. Advance diet Up with therapy  Patient will d/c to SNF when medically stable.   Patient instructed to use incentive spirometer. Spoke with nursing as spirometer not found in room.   Awaiting consult from medicine for acute renal insufficiency/fever. Continue NS 100 mL/hr at this time.   DVT Prophylaxis - Lovenox, Ted hose and foot pumps Weight-Bearing as tolerated to right leg  , PA-C [facility] 02/16/2020, 8:25 AM

## 2020-02-17 DIAGNOSIS — F0150 Vascular dementia without behavioral disturbance: Secondary | ICD-10-CM | POA: Diagnosis not present

## 2020-02-17 DIAGNOSIS — I2510 Atherosclerotic heart disease of native coronary artery without angina pectoris: Secondary | ICD-10-CM | POA: Diagnosis not present

## 2020-02-17 DIAGNOSIS — Z9889 Other specified postprocedural states: Secondary | ICD-10-CM | POA: Diagnosis not present

## 2020-02-17 DIAGNOSIS — I255 Ischemic cardiomyopathy: Secondary | ICD-10-CM | POA: Diagnosis not present

## 2020-02-17 DIAGNOSIS — Z9861 Coronary angioplasty status: Secondary | ICD-10-CM | POA: Diagnosis not present

## 2020-02-17 DIAGNOSIS — Z7401 Bed confinement status: Secondary | ICD-10-CM | POA: Diagnosis not present

## 2020-02-17 DIAGNOSIS — N1831 Chronic kidney disease, stage 3a: Secondary | ICD-10-CM | POA: Diagnosis not present

## 2020-02-17 DIAGNOSIS — I5022 Chronic systolic (congestive) heart failure: Secondary | ICD-10-CM | POA: Diagnosis not present

## 2020-02-17 DIAGNOSIS — Z7982 Long term (current) use of aspirin: Secondary | ICD-10-CM | POA: Diagnosis not present

## 2020-02-17 DIAGNOSIS — R001 Bradycardia, unspecified: Secondary | ICD-10-CM | POA: Diagnosis not present

## 2020-02-17 DIAGNOSIS — L03115 Cellulitis of right lower limb: Secondary | ICD-10-CM | POA: Diagnosis not present

## 2020-02-17 DIAGNOSIS — I130 Hypertensive heart and chronic kidney disease with heart failure and stage 1 through stage 4 chronic kidney disease, or unspecified chronic kidney disease: Secondary | ICD-10-CM | POA: Diagnosis not present

## 2020-02-17 DIAGNOSIS — Z471 Aftercare following joint replacement surgery: Secondary | ICD-10-CM | POA: Diagnosis not present

## 2020-02-17 DIAGNOSIS — R279 Unspecified lack of coordination: Secondary | ICD-10-CM | POA: Diagnosis not present

## 2020-02-17 DIAGNOSIS — Z96651 Presence of right artificial knee joint: Secondary | ICD-10-CM | POA: Diagnosis not present

## 2020-02-17 DIAGNOSIS — R52 Pain, unspecified: Secondary | ICD-10-CM | POA: Diagnosis not present

## 2020-02-17 DIAGNOSIS — I252 Old myocardial infarction: Secondary | ICD-10-CM | POA: Diagnosis not present

## 2020-02-17 DIAGNOSIS — F0280 Dementia in other diseases classified elsewhere without behavioral disturbance: Secondary | ICD-10-CM | POA: Diagnosis not present

## 2020-02-17 DIAGNOSIS — I219 Acute myocardial infarction, unspecified: Secondary | ICD-10-CM | POA: Diagnosis not present

## 2020-02-17 DIAGNOSIS — E1122 Type 2 diabetes mellitus with diabetic chronic kidney disease: Secondary | ICD-10-CM | POA: Diagnosis not present

## 2020-02-17 DIAGNOSIS — E1140 Type 2 diabetes mellitus with diabetic neuropathy, unspecified: Secondary | ICD-10-CM | POA: Diagnosis not present

## 2020-02-17 DIAGNOSIS — Z794 Long term (current) use of insulin: Secondary | ICD-10-CM | POA: Diagnosis not present

## 2020-02-17 DIAGNOSIS — I10 Essential (primary) hypertension: Secondary | ICD-10-CM | POA: Diagnosis not present

## 2020-02-17 DIAGNOSIS — G309 Alzheimer's disease, unspecified: Secondary | ICD-10-CM | POA: Diagnosis not present

## 2020-02-17 DIAGNOSIS — Z23 Encounter for immunization: Secondary | ICD-10-CM | POA: Diagnosis not present

## 2020-02-17 LAB — GLUCOSE, CAPILLARY
Glucose-Capillary: 127 mg/dL — ABNORMAL HIGH (ref 70–99)
Glucose-Capillary: 129 mg/dL — ABNORMAL HIGH (ref 70–99)
Glucose-Capillary: 111 mg/dL — ABNORMAL HIGH (ref 70–99)

## 2020-02-17 LAB — BASIC METABOLIC PANEL
Anion gap: 9 (ref 5–15)
BUN: 32 mg/dL — ABNORMAL HIGH (ref 8–23)
CO2: 23 mmol/L (ref 22–32)
Calcium: 8.9 mg/dL (ref 8.9–10.3)
Chloride: 106 mmol/L (ref 98–111)
Creatinine, Ser: 1.09 mg/dL (ref 0.61–1.24)
GFR, Estimated: >60 mL/min (ref >60)
Glucose, Bld: 165 mg/dL — ABNORMAL HIGH (ref 70–99)
Potassium: 4 mmol/L (ref 3.5–5.1)
Sodium: 138 mmol/L (ref 135–145)

## 2020-02-17 NOTE — Care Management Important Message (Signed)
Important Message  Patient Details  Name: Todd Spencer MRN: [id number] Date of Birth: 17-Oct-1950   Medicare Important Message Given:  Other (see comment)  Patient was sleeping soundly and I was unable to obtain signature.  I left a copy of the Important Message from Medicare at his bedside for review when he wakes up.  Olegario Messier A Lyla Jasek 02/17/2020, 11:22 AM

## 2020-02-17 NOTE — TOC Transition Note (Signed)
Transition of Care [facility]) - CM/SW Discharge Note   Patient Details  Name: ANIK WESCH MRN: [id number] Date of Birth: 08/14/1950  [facility]) CM/SW Contact:  Maud Deed, LCSW Phone Number: 02/17/2020, 11:39 AM   Clinical Narrative:    Pt medically stable or discharge per MD. Pt will be transported via EMS to [facility]. Call to report number is [phone]. CSW will arrange transport once nursing staff is ready.   Final next level of care: Skilled Nursing Facility Barriers to Discharge: No Barriers Identified   Patient Goals and CMS Choice Patient states their goals for this hospitalization and ongoing recovery are:: I want to go back to [facility]   Choice offered to / list presented to : Patient  Discharge Placement                Patient to be transferred to facility by: EMS Name of family member notified: [facility] Patient and family notified of of transfer: 02/17/20  Discharge Plan and Services     Post Acute Care Choice: Skilled Nursing Facility                               Social Determinants of Health (SDOH) Interventions     Readmission Risk Interventions No flowsheet data found.
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
## 2020-02-17 NOTE — Progress Notes (Signed)
Patient's AKI has resolved.  I have recommended to discontinue IV fluids. He  Is being planned to be discharged to skilled nursing facility today. Case briefly  discussed with Dr. Rosita Kea.

## 2020-02-17 NOTE — Progress Notes (Addendum)
Pt discharging to [facility]. Waiting on transport, Transport arrived 6:25pm and transported Pt. IV was removed from right forearm without complications. Pt belongings packed and pt is ready and dressed. Pravena wound vac on. Called report to Tiffany, Nurse at [facility].

## 2020-02-22 DIAGNOSIS — Z794 Long term (current) use of insulin: Secondary | ICD-10-CM | POA: Diagnosis not present

## 2020-02-22 DIAGNOSIS — G309 Alzheimer's disease, unspecified: Secondary | ICD-10-CM | POA: Diagnosis not present

## 2020-02-22 DIAGNOSIS — F0150 Vascular dementia without behavioral disturbance: Secondary | ICD-10-CM | POA: Diagnosis not present

## 2020-02-22 DIAGNOSIS — N1831 Chronic kidney disease, stage 3a: Secondary | ICD-10-CM | POA: Diagnosis not present

## 2020-02-22 DIAGNOSIS — F0280 Dementia in other diseases classified elsewhere without behavioral disturbance: Secondary | ICD-10-CM | POA: Diagnosis not present

## 2020-02-22 DIAGNOSIS — E1122 Type 2 diabetes mellitus with diabetic chronic kidney disease: Secondary | ICD-10-CM | POA: Diagnosis not present

## 2020-02-22 DIAGNOSIS — L03115 Cellulitis of right lower limb: Secondary | ICD-10-CM | POA: Diagnosis not present

## 2020-02-26 DIAGNOSIS — I5022 Chronic systolic (congestive) heart failure: Secondary | ICD-10-CM | POA: Diagnosis not present

## 2020-02-26 DIAGNOSIS — Z9861 Coronary angioplasty status: Secondary | ICD-10-CM | POA: Diagnosis not present

## 2020-02-26 DIAGNOSIS — R001 Bradycardia, unspecified: Secondary | ICD-10-CM | POA: Diagnosis not present

## 2020-02-26 DIAGNOSIS — I2510 Atherosclerotic heart disease of native coronary artery without angina pectoris: Secondary | ICD-10-CM | POA: Diagnosis not present

## 2020-02-26 DIAGNOSIS — I10 Essential (primary) hypertension: Secondary | ICD-10-CM | POA: Diagnosis not present

## 2020-02-26 DIAGNOSIS — I255 Ischemic cardiomyopathy: Secondary | ICD-10-CM | POA: Diagnosis not present

## 2020-02-26 DIAGNOSIS — Z23 Encounter for immunization: Secondary | ICD-10-CM | POA: Diagnosis not present

## 2020-02-26 DIAGNOSIS — I219 Acute myocardial infarction, unspecified: Secondary | ICD-10-CM | POA: Diagnosis not present

## 2020-02-26 DIAGNOSIS — Z9889 Other specified postprocedural states: Secondary | ICD-10-CM | POA: Diagnosis not present

## 2020-03-05 DIAGNOSIS — E1140 Type 2 diabetes mellitus with diabetic neuropathy, unspecified: Secondary | ICD-10-CM | POA: Diagnosis not present

## 2020-03-05 DIAGNOSIS — F0280 Dementia in other diseases classified elsewhere without behavioral disturbance: Secondary | ICD-10-CM | POA: Diagnosis not present

## 2020-03-05 DIAGNOSIS — G309 Alzheimer's disease, unspecified: Secondary | ICD-10-CM | POA: Diagnosis not present

## 2020-03-05 DIAGNOSIS — E1122 Type 2 diabetes mellitus with diabetic chronic kidney disease: Secondary | ICD-10-CM | POA: Diagnosis not present

## 2020-03-05 DIAGNOSIS — T8453XD Infection and inflammatory reaction due to internal right knee prosthesis, subsequent encounter: Secondary | ICD-10-CM | POA: Diagnosis not present

## 2020-03-05 DIAGNOSIS — I130 Hypertensive heart and chronic kidney disease with heart failure and stage 1 through stage 4 chronic kidney disease, or unspecified chronic kidney disease: Secondary | ICD-10-CM | POA: Diagnosis not present

## 2020-03-05 DIAGNOSIS — F0150 Vascular dementia without behavioral disturbance: Secondary | ICD-10-CM | POA: Diagnosis not present

## 2020-03-05 DIAGNOSIS — L03115 Cellulitis of right lower limb: Secondary | ICD-10-CM | POA: Diagnosis not present

## 2020-03-05 DIAGNOSIS — I5022 Chronic systolic (congestive) heart failure: Secondary | ICD-10-CM | POA: Diagnosis not present

## 2020-03-07 ENCOUNTER — Other Ambulatory Visit: Payer: Self-pay

## 2020-03-07 NOTE — Patient Outreach (Signed)
[name] [facility]) Care Management  03/07/2020  Todd Spencer [birth year] [id number]   Referral Date: 03/07/20 Referral Source: [employer] Report Date of Discharge: 03/04/20 Facility:  [place]: [facility]   Referral received.  No outreach warranted at this time.  Transition of Care calls being completed via EMMI. RN CM will outreach patient for any red flags received.    Plan: RN CM will close case.    [name], RN, MSN [facility] Care Management Care Management Coordinator Direct Line [phone] Toll Free: [phone]  Fax: [phone]

## 2020-03-09 DIAGNOSIS — E1140 Type 2 diabetes mellitus with diabetic neuropathy, unspecified: Secondary | ICD-10-CM | POA: Diagnosis not present

## 2020-03-09 DIAGNOSIS — L03115 Cellulitis of right lower limb: Secondary | ICD-10-CM | POA: Diagnosis not present

## 2020-03-09 DIAGNOSIS — F0150 Vascular dementia without behavioral disturbance: Secondary | ICD-10-CM | POA: Diagnosis not present

## 2020-03-09 DIAGNOSIS — I130 Hypertensive heart and chronic kidney disease with heart failure and stage 1 through stage 4 chronic kidney disease, or unspecified chronic kidney disease: Secondary | ICD-10-CM | POA: Diagnosis not present

## 2020-03-09 DIAGNOSIS — G309 Alzheimer's disease, unspecified: Secondary | ICD-10-CM | POA: Diagnosis not present

## 2020-03-09 DIAGNOSIS — E1122 Type 2 diabetes mellitus with diabetic chronic kidney disease: Secondary | ICD-10-CM | POA: Diagnosis not present

## 2020-03-09 DIAGNOSIS — I5022 Chronic systolic (congestive) heart failure: Secondary | ICD-10-CM | POA: Diagnosis not present

## 2020-03-09 DIAGNOSIS — F0280 Dementia in other diseases classified elsewhere without behavioral disturbance: Secondary | ICD-10-CM | POA: Diagnosis not present

## 2020-03-09 DIAGNOSIS — T8453XD Infection and inflammatory reaction due to internal right knee prosthesis, subsequent encounter: Secondary | ICD-10-CM | POA: Diagnosis not present

## 2020-03-18 DIAGNOSIS — G309 Alzheimer's disease, unspecified: Secondary | ICD-10-CM | POA: Diagnosis not present

## 2020-03-18 DIAGNOSIS — E1140 Type 2 diabetes mellitus with diabetic neuropathy, unspecified: Secondary | ICD-10-CM | POA: Diagnosis not present

## 2020-03-18 DIAGNOSIS — F0280 Dementia in other diseases classified elsewhere without behavioral disturbance: Secondary | ICD-10-CM | POA: Diagnosis not present

## 2020-03-18 DIAGNOSIS — F0150 Vascular dementia without behavioral disturbance: Secondary | ICD-10-CM | POA: Diagnosis not present

## 2020-03-18 DIAGNOSIS — L03115 Cellulitis of right lower limb: Secondary | ICD-10-CM | POA: Diagnosis not present

## 2020-03-18 DIAGNOSIS — E1122 Type 2 diabetes mellitus with diabetic chronic kidney disease: Secondary | ICD-10-CM | POA: Diagnosis not present

## 2020-03-18 DIAGNOSIS — I130 Hypertensive heart and chronic kidney disease with heart failure and stage 1 through stage 4 chronic kidney disease, or unspecified chronic kidney disease: Secondary | ICD-10-CM | POA: Diagnosis not present

## 2020-03-18 DIAGNOSIS — I5022 Chronic systolic (congestive) heart failure: Secondary | ICD-10-CM | POA: Diagnosis not present

## 2020-03-18 DIAGNOSIS — T8453XD Infection and inflammatory reaction due to internal right knee prosthesis, subsequent encounter: Secondary | ICD-10-CM | POA: Diagnosis not present

## 2020-03-23 DIAGNOSIS — Z96651 Presence of right artificial knee joint: Secondary | ICD-10-CM | POA: Diagnosis not present

## 2020-03-23 DIAGNOSIS — M1711 Unilateral primary osteoarthritis, right knee: Secondary | ICD-10-CM | POA: Diagnosis not present

## 2020-03-25 DIAGNOSIS — Z794 Long term (current) use of insulin: Secondary | ICD-10-CM | POA: Diagnosis not present

## 2020-03-25 DIAGNOSIS — I130 Hypertensive heart and chronic kidney disease with heart failure and stage 1 through stage 4 chronic kidney disease, or unspecified chronic kidney disease: Secondary | ICD-10-CM | POA: Diagnosis not present

## 2020-03-25 DIAGNOSIS — I252 Old myocardial infarction: Secondary | ICD-10-CM | POA: Diagnosis not present

## 2020-03-25 DIAGNOSIS — Z96651 Presence of right artificial knee joint: Secondary | ICD-10-CM | POA: Diagnosis not present

## 2020-03-25 DIAGNOSIS — I2510 Atherosclerotic heart disease of native coronary artery without angina pectoris: Secondary | ICD-10-CM | POA: Diagnosis not present

## 2020-03-25 DIAGNOSIS — E1122 Type 2 diabetes mellitus with diabetic chronic kidney disease: Secondary | ICD-10-CM | POA: Diagnosis not present

## 2020-03-25 DIAGNOSIS — Z7982 Long term (current) use of aspirin: Secondary | ICD-10-CM | POA: Diagnosis not present

## 2020-03-25 DIAGNOSIS — Z471 Aftercare following joint replacement surgery: Secondary | ICD-10-CM | POA: Diagnosis not present

## 2020-03-28 DIAGNOSIS — H34831 Tributary (branch) retinal vein occlusion, right eye, with macular edema: Secondary | ICD-10-CM | POA: Diagnosis not present

## 2020-04-01 DIAGNOSIS — E1122 Type 2 diabetes mellitus with diabetic chronic kidney disease: Secondary | ICD-10-CM | POA: Diagnosis not present

## 2020-04-01 DIAGNOSIS — G309 Alzheimer's disease, unspecified: Secondary | ICD-10-CM | POA: Diagnosis not present

## 2020-04-01 DIAGNOSIS — F0280 Dementia in other diseases classified elsewhere without behavioral disturbance: Secondary | ICD-10-CM | POA: Diagnosis not present

## 2020-04-01 DIAGNOSIS — I130 Hypertensive heart and chronic kidney disease with heart failure and stage 1 through stage 4 chronic kidney disease, or unspecified chronic kidney disease: Secondary | ICD-10-CM | POA: Diagnosis not present

## 2020-04-01 DIAGNOSIS — T8453XD Infection and inflammatory reaction due to internal right knee prosthesis, subsequent encounter: Secondary | ICD-10-CM | POA: Diagnosis not present

## 2020-04-01 DIAGNOSIS — E1140 Type 2 diabetes mellitus with diabetic neuropathy, unspecified: Secondary | ICD-10-CM | POA: Diagnosis not present

## 2020-04-01 DIAGNOSIS — L03115 Cellulitis of right lower limb: Secondary | ICD-10-CM | POA: Diagnosis not present

## 2020-04-01 DIAGNOSIS — F0150 Vascular dementia without behavioral disturbance: Secondary | ICD-10-CM | POA: Diagnosis not present

## 2020-04-01 DIAGNOSIS — I5022 Chronic systolic (congestive) heart failure: Secondary | ICD-10-CM | POA: Diagnosis not present

## 2020-04-26 DIAGNOSIS — E113291 Type 2 diabetes mellitus with mild nonproliferative diabetic retinopathy without macular edema, right eye: Secondary | ICD-10-CM | POA: Diagnosis not present

## 2020-04-27 DIAGNOSIS — N1831 Chronic kidney disease, stage 3a: Secondary | ICD-10-CM | POA: Diagnosis not present

## 2020-04-27 DIAGNOSIS — E118 Type 2 diabetes mellitus with unspecified complications: Secondary | ICD-10-CM | POA: Diagnosis not present

## 2020-05-02 DIAGNOSIS — I10 Essential (primary) hypertension: Secondary | ICD-10-CM | POA: Diagnosis not present

## 2020-05-02 DIAGNOSIS — N1831 Chronic kidney disease, stage 3a: Secondary | ICD-10-CM | POA: Diagnosis not present

## 2020-05-02 DIAGNOSIS — E1121 Type 2 diabetes mellitus with diabetic nephropathy: Secondary | ICD-10-CM | POA: Diagnosis not present

## 2020-05-02 DIAGNOSIS — E875 Hyperkalemia: Secondary | ICD-10-CM | POA: Diagnosis not present

## 2020-05-03 DIAGNOSIS — R202 Paresthesia of skin: Secondary | ICD-10-CM | POA: Diagnosis not present

## 2020-05-03 DIAGNOSIS — G5603 Carpal tunnel syndrome, bilateral upper limbs: Secondary | ICD-10-CM | POA: Diagnosis not present

## 2020-05-03 DIAGNOSIS — H9190 Unspecified hearing loss, unspecified ear: Secondary | ICD-10-CM | POA: Diagnosis not present

## 2020-05-03 DIAGNOSIS — R413 Other amnesia: Secondary | ICD-10-CM | POA: Diagnosis not present

## 2020-05-03 DIAGNOSIS — E1142 Type 2 diabetes mellitus with diabetic polyneuropathy: Secondary | ICD-10-CM | POA: Diagnosis not present

## 2020-05-03 DIAGNOSIS — M792 Neuralgia and neuritis, unspecified: Secondary | ICD-10-CM | POA: Diagnosis not present

## 2020-05-03 DIAGNOSIS — R200 Anesthesia of skin: Secondary | ICD-10-CM | POA: Diagnosis not present

## 2020-05-04 DIAGNOSIS — H903 Sensorineural hearing loss, bilateral: Secondary | ICD-10-CM | POA: Diagnosis not present

## 2020-05-04 DIAGNOSIS — H6123 Impacted cerumen, bilateral: Secondary | ICD-10-CM | POA: Diagnosis not present

## 2020-05-09 DIAGNOSIS — H34831 Tributary (branch) retinal vein occlusion, right eye, with macular edema: Secondary | ICD-10-CM | POA: Diagnosis not present

## 2020-05-11 DIAGNOSIS — I10 Essential (primary) hypertension: Secondary | ICD-10-CM | POA: Diagnosis not present

## 2020-05-11 DIAGNOSIS — R413 Other amnesia: Secondary | ICD-10-CM | POA: Diagnosis not present

## 2020-05-11 DIAGNOSIS — E1142 Type 2 diabetes mellitus with diabetic polyneuropathy: Secondary | ICD-10-CM | POA: Diagnosis not present

## 2020-05-11 DIAGNOSIS — Z0000 Encounter for general adult medical examination without abnormal findings: Secondary | ICD-10-CM | POA: Diagnosis not present

## 2020-05-11 DIAGNOSIS — E1149 Type 2 diabetes mellitus with other diabetic neurological complication: Secondary | ICD-10-CM | POA: Diagnosis not present

## 2020-05-24 ENCOUNTER — Other Ambulatory Visit: Payer: Self-pay | Admitting: Surgery

## 2020-05-24 DIAGNOSIS — R1012 Left upper quadrant pain: Secondary | ICD-10-CM

## 2020-05-24 DIAGNOSIS — R1031 Right lower quadrant pain: Secondary | ICD-10-CM | POA: Diagnosis not present

## 2020-06-01 DIAGNOSIS — E113293 Type 2 diabetes mellitus with mild nonproliferative diabetic retinopathy without macular edema, bilateral: Secondary | ICD-10-CM | POA: Diagnosis not present

## 2020-06-01 DIAGNOSIS — N1831 Chronic kidney disease, stage 3a: Secondary | ICD-10-CM | POA: Diagnosis not present

## 2020-06-01 DIAGNOSIS — Z794 Long term (current) use of insulin: Secondary | ICD-10-CM | POA: Diagnosis not present

## 2020-06-01 DIAGNOSIS — E1142 Type 2 diabetes mellitus with diabetic polyneuropathy: Secondary | ICD-10-CM | POA: Diagnosis not present

## 2020-06-01 DIAGNOSIS — E1159 Type 2 diabetes mellitus with other circulatory complications: Secondary | ICD-10-CM | POA: Diagnosis not present

## 2020-06-01 DIAGNOSIS — E1122 Type 2 diabetes mellitus with diabetic chronic kidney disease: Secondary | ICD-10-CM | POA: Diagnosis not present

## 2020-06-07 DIAGNOSIS — H34831 Tributary (branch) retinal vein occlusion, right eye, with macular edema: Secondary | ICD-10-CM | POA: Diagnosis not present

## 2020-06-08 ENCOUNTER — Ambulatory Visit: Payer: Medicare HMO

## 2020-06-22 ENCOUNTER — Ambulatory Visit
Admission: RE | Admit: 2020-06-22 | Discharge: 2020-06-22 | Disposition: A | Discharge status: Home / Self Care | Payer: Medicare HMO | Source: Ambulatory Visit | Attending: Surgery | Admitting: Surgery

## 2020-06-22 ENCOUNTER — Other Ambulatory Visit: Payer: Self-pay

## 2020-06-22 DIAGNOSIS — R1012 Left upper quadrant pain: Secondary | ICD-10-CM | POA: Diagnosis not present

## 2020-06-22 DIAGNOSIS — K529 Noninfective gastroenteritis and colitis, unspecified: Secondary | ICD-10-CM | POA: Diagnosis not present

## 2020-07-19 DIAGNOSIS — H34831 Tributary (branch) retinal vein occlusion, right eye, with macular edema: Secondary | ICD-10-CM | POA: Diagnosis not present

## 2020-08-02 ENCOUNTER — Other Ambulatory Visit: Payer: Self-pay

## 2020-08-03 ENCOUNTER — Other Ambulatory Visit: Payer: Self-pay

## 2020-08-03 NOTE — Patient Outreach (Signed)
[place] [facility]) Care Management  08/03/2020  RAFI KENNETH 1950/03/28 [id number]   Telephone call to patient for patient interest in care management.  No answer.  Unable to leave a message.  Plan: RN CM will attempt patient again within 4 business days and send letter.  [name], RN, MSN [place] Management Care Management Coordinator Direct Line [phone] Cell [phone] Toll Free: [phone]  Fax: [phone]

## 2020-08-04 ENCOUNTER — Other Ambulatory Visit: Payer: Self-pay

## 2020-08-04 NOTE — Patient Outreach (Signed)
[facility]) Care Management  08/04/2020  Todd Spencer [birth year] [id number]   Telephone call to patient for patient interest in care management.  No answer.  Unable to leave a message.   Plan: RN CM will attempt patient again within 4 business days.  [name], RN, MSN [place] Management Care Management Coordinator Direct Line [phone] Cell [phone] Toll Free: [phone]  Fax: [phone]

## 2020-08-05 ENCOUNTER — Other Ambulatory Visit: Payer: Self-pay

## 2020-08-05 NOTE — Patient Outreach (Signed)
[place] [facility]) Care Management  08/05/2020  TANNEN VANDEZANDE [birth year] [id number]   Telephone call to patient for patient interest in care management.  No answer.  Unable to leave a message.   Plan: RN CM will attempt patient again in 3 weeks.  [name], RN, MSN [place] Management Care Management Coordinator Direct Line [phone] Cell [phone] Toll Free: [phone]  Fax: [phone]

## 2020-08-09 ENCOUNTER — Other Ambulatory Visit: Payer: Self-pay

## 2020-08-09 NOTE — Patient Instructions (Signed)
Goals Addressed             This Visit's Progress    Monitor and Manage My Blood Sugar-Diabetes Type 2       Barriers: Health Behaviors Knowledge Timeframe:  Long-Range Goal Priority:  High Start Date:   08/09/20                          Expected End Date:   02/18/21                    Follow Up Date 09/18/20   - check blood sugar at prescribed times - take the blood sugar log to all doctor visits    Why is this important?   Checking your blood sugar at home helps to keep it from getting very high or very low.  Writing the results in a diary or log helps the doctor know how to care for you.  Your blood sugar log should have the time, date and the results.  Also, write down the amount of insulin or other medicine that you take.  Other information, like what you ate, exercise done and how you were feeling, will also be helpful.     Notes: Patient managing diabetes currently A1c 5.7.   Diabetes Management Discussed: Medication adherence Reviewed importance of limiting carbs such as rice, potatoes, breads, and pastas. Also discussed limiting sweets and sugary drinks.  Discussed importance of portion control.  Also discussed importance of annual exams, foot exams, and eye exams.

## 2020-08-09 NOTE — Patient Outreach (Signed)
[place] [facility]) Care Management  [place]  08/09/2020   Todd Spencer 1950/12/28 [id number]  Subjective: Telephone call to patient for follow up after Join EMMI call.  Discussed [facility] services with patient. He is agreeable to Care Management outreach telephonically.  Patient lives alone but has some male friends.  He is independent with all aspects of care and still drives.  He loves working in the yard and being active.  He gets most his medication from [facility] and others from [place] per patient.    Patient with history of diabetes with last A1c 5.7 per patient. He states however wanting help with controlling his diabetes.  Discussed diabetes management. Patient also reports history of heart disease and knee replacement.  He is up to date on eye exam and visits with his doctors.    Objective:   Encounter Medications:  Outpatient Encounter Medications as of 08/09/2020  Medication Sig Note   acetaminophen (TYLENOL) 500 MG tablet Take 2 tablets (1,000 mg total) by mouth every 6 (six) hours.    atorvastatin (LIPITOR) 40 MG tablet Take 40 mg by mouth daily.    carvedilol (COREG) 3.125 MG tablet Take 3.125 mg by mouth 2 (two) times daily with a meal.    diazepam (VALIUM) 5 MG tablet Take 5 mg by mouth every 12 (twelve) hours as needed for anxiety (vertigo).    donepezil (ARICEPT) 10 MG tablet Take 10 mg by mouth daily.    doxepin (SINEQUAN) 100 MG capsule Take 100-200 mg by mouth See admin instructions. Take 100 mg in the morning and 200 mg at night    famotidine (PEPCID) 40 MG tablet Take 40 mg by mouth at bedtime.    gemfibrozil (LOPID) 600 MG tablet Take 600 mg by mouth 2 (two) times daily before a meal.    insulin regular (NOVOLIN R,HUMULIN R) 100 units/mL injection Inject 50 Units into the skin 3 (three) times daily.    isosorbide dinitrate (ISORDIL) 30 MG tablet Take 30 mg by mouth 3 (three) times daily.     lisinopril (ZESTRIL) 10 MG tablet Take 10 mg  by mouth daily.    magnesium hydroxide (MILK OF MAGNESIA) 400 MG/5ML suspension Take 15 mLs by mouth at bedtime.    meclizine (ANTIVERT) 25 MG tablet Take 25 mg by mouth 2 (two) times daily.     metFORMIN (GLUCOPHAGE-XR) 500 MG 24 hr tablet Take 1,500 mg by mouth at bedtime.    Multiple Vitamin (MULTIVITAMIN WITH MINERALS) TABS tablet Take 1 tablet by mouth daily.    nitroGLYCERIN (NITROSTAT) 0.4 MG SL tablet Place 0.4 mg under the tongue every 5 (five) minutes as needed for chest pain.     pregabalin (LYRICA) 300 MG capsule Take 300 mg by mouth 2 (two) times daily.    sodium chloride (OCEAN) 0.65 % SOLN nasal spray Place 1 spray into both nostrils 2 (two) times daily as needed for congestion.    enoxaparin (LOVENOX) 40 MG/0.4ML injection Inject 0.4 mLs (40 mg total) into the skin daily for 14 days.    insulin glargine (LANTUS) 100 UNIT/ML injection Inject 1 mL (100 Units total) into the skin at bedtime. 08/09/2020: Taking   methocarbamol (ROBAXIN) 500 MG tablet Take 1 tablet (500 mg total) by mouth every 6 (six) hours as needed for muscle spasms. (Patient not taking: Reported on 08/09/2020)    oxyCODONE (OXY IR/ROXICODONE) 5 MG immediate release tablet Take 1-2 tablets (5-10 mg total) by mouth every 4 (four) hours  as needed for moderate pain (pain score 4-6). (Patient not taking: Reported on 08/09/2020)    No facility-administered encounter medications on file as of 08/09/2020.    Functional Status:  In your present state of health, do you have any difficulty performing the following activities: 08/09/2020 02/11/2020  Hearing? Tempie Donning  Vision? N N  Difficulty concentrating or making decisions? Y N  Comment short term memory loss -  Walking or climbing stairs? N Y  Dressing or bathing? N N  Doing errands, shopping? N Y  Conservation officer, nature and eating ? N -  Using the Toilet? N -  In the past six months, have you accidently leaked urine? N -  Do you have problems with loss of bowel control? N -   Managing your Medications? N -  Managing your Finances? N -  Housekeeping or managing your Housekeeping? N -  Some recent data might be hidden    Fall/Depression Screening: Fall Risk  08/09/2020 01/06/2016 01/03/2016  Falls in the past year? 0 No No   PHQ 2/9 Scores 08/09/2020 01/03/2016  PHQ - 2 Score 0 0    Assessment:   Care Plan Care Plan : Diabetes Type 2 (Adult)  Updates made by Jon Billings, RN since 08/09/2020 12:00 AM     Problem: Glycemic Management (Diabetes, Type 2)      Long-Range Goal: Glycemic Management Optimized as evidenced by A1c less than 7   Start Date: 08/09/2020  Expected End Date: 02/18/2021  Note:   Evidence-based guidance:  Anticipate A1C testing (point-of-care) every 3 to 6 months based on goal attainment.  Review mutually-set A1C goal or target range.  Anticipate use of antihyperglycemic with or without insulin and periodic adjustments; consider active involvement of pharmacist.  Provide medical nutrition therapy and development of individualized eating.  Compare self-reported symptoms of hypo or hyperglycemia to blood glucose levels, diet and fluid intake, current medications, psychosocial and physiologic stressors, change in activity and barriers to care adherence.  Promote self-monitoring of blood glucose levels.  Notes:     Task: Alleviate Barriers to Glycemic Management   Due Date: 02/18/2021  Priority: Routine  Responsible User: Jon Billings, RN  Note:   Care Management Activities:    - blood glucose monitoring encouraged - blood glucose readings reviewed - self-awareness of signs/symptoms of hypo or hyperglycemia encouraged - use of blood glucose monitoring log promoted    Notes: 08/09/20 Patient reports that his sugars run between 85-95 in the morning. Patient takes medications as prescribed.   Diabetes Management Discussed: Medication adherence Reviewed importance of limiting carbs such as rice, potatoes, breads, and pastas.  Also discussed limiting sweets and sugary drinks.  Discussed importance of portion control.  Also discussed importance of annual exams, foot exams, and eye exams.         Goals Addressed             This Visit's Progress    Monitor and Manage My Blood Sugar-Diabetes Type 2       Barriers: Health Behaviors Knowledge Timeframe:  Long-Range Goal Priority:  High Start Date:   08/09/20                          Expected End Date:   02/18/21                    Follow Up Date 09/18/20   - check blood sugar at prescribed times - take the  blood sugar log to all doctor visits    Why is this important?   Checking your blood sugar at home helps to keep it from getting very high or very low.  Writing the results in a diary or log helps the doctor know how to care for you.  Your blood sugar log should have the time, date and the results.  Also, write down the amount of insulin or other medicine that you take.  Other information, like what you ate, exercise done and how you were feeling, will also be helpful.     Notes: Patient managing diabetes currently A1c 5.7.   Diabetes Management Discussed: Medication adherence Reviewed importance of limiting carbs such as rice, potatoes, breads, and pastas. Also discussed limiting sweets and sugary drinks.  Discussed importance of portion control.  Also discussed importance of annual exams, foot exams, and eye exams.             Plan: RN CM will provide ongoing education and support to patient through phone calls.   RN CM will send welcome packet with consent to patient.   RN CM will send initial barriers letter, assessment, and care plan to primary care physician.   Follow-up: Patient agrees to Care Plan and Follow-up. Follow-up in 4-6 week(s)  [name], RN, MSN [place] Management Care Management Coordinator Direct Line [phone] Cell [phone] Toll Free: [phone]  Fax: [phone]

## 2020-08-24 ENCOUNTER — Ambulatory Visit: Payer: Self-pay

## 2020-08-31 DIAGNOSIS — E1159 Type 2 diabetes mellitus with other circulatory complications: Secondary | ICD-10-CM | POA: Diagnosis not present

## 2020-09-01 ENCOUNTER — Other Ambulatory Visit: Payer: Self-pay

## 2020-09-01 NOTE — Patient Outreach (Signed)
[facility]) Care Management  [place]  09/01/2020   Todd Spencer 01-09-1951 [id number]  Subjective: Telephone call to patient for disease management follow up. Patient reports he is doing good.  He reports he has not had to take a morning shot as his sugars in the AM are ranging between 80-95.  Reiterated diabetes management.  He verbalized understanding and voices no concerns.    Objective:   Encounter Medications:  Outpatient Encounter Medications as of 09/01/2020  Medication Sig Note   acetaminophen (TYLENOL) 500 MG tablet Take 2 tablets (1,000 mg total) by mouth every 6 (six) hours.    atorvastatin (LIPITOR) 40 MG tablet Take 40 mg by mouth daily.    carvedilol (COREG) 3.125 MG tablet Take 3.125 mg by mouth 2 (two) times daily with a meal.    diazepam (VALIUM) 5 MG tablet Take 5 mg by mouth every 12 (twelve) hours as needed for anxiety (vertigo).    donepezil (ARICEPT) 10 MG tablet Take 10 mg by mouth daily.    doxepin (SINEQUAN) 100 MG capsule Take 100-200 mg by mouth See admin instructions. Take 100 mg in the morning and 200 mg at night    enoxaparin (LOVENOX) 40 MG/0.4ML injection Inject 0.4 mLs (40 mg total) into the skin daily for 14 days.    famotidine (PEPCID) 40 MG tablet Take 40 mg by mouth at bedtime.    gemfibrozil (LOPID) 600 MG tablet Take 600 mg by mouth 2 (two) times daily before a meal.    insulin glargine (LANTUS) 100 UNIT/ML injection Inject 1 mL (100 Units total) into the skin at bedtime. 08/09/2020: Taking   insulin regular (NOVOLIN R,HUMULIN R) 100 units/mL injection Inject 50 Units into the skin 3 (three) times daily.    isosorbide dinitrate (ISORDIL) 30 MG tablet Take 30 mg by mouth 3 (three) times daily.     lisinopril (ZESTRIL) 10 MG tablet Take 10 mg by mouth daily.    magnesium hydroxide (MILK OF MAGNESIA) 400 MG/5ML suspension Take 15 mLs by mouth at bedtime.    meclizine (ANTIVERT) 25 MG tablet Take 25 mg by mouth 2 (two)  times daily.     metFORMIN (GLUCOPHAGE-XR) 500 MG 24 hr tablet Take 1,500 mg by mouth at bedtime.    methocarbamol (ROBAXIN) 500 MG tablet Take 1 tablet (500 mg total) by mouth every 6 (six) hours as needed for muscle spasms. (Patient not taking: Reported on 08/09/2020)    Multiple Vitamin (MULTIVITAMIN WITH MINERALS) TABS tablet Take 1 tablet by mouth daily.    nitroGLYCERIN (NITROSTAT) 0.4 MG SL tablet Place 0.4 mg under the tongue every 5 (five) minutes as needed for chest pain.     oxyCODONE (OXY IR/ROXICODONE) 5 MG immediate release tablet Take 1-2 tablets (5-10 mg total) by mouth every 4 (four) hours as needed for moderate pain (pain score 4-6). (Patient not taking: Reported on 08/09/2020)    pregabalin (LYRICA) 300 MG capsule Take 300 mg by mouth 2 (two) times daily.    sodium chloride (OCEAN) 0.65 % SOLN nasal spray Place 1 spray into both nostrils 2 (two) times daily as needed for congestion.    No facility-administered encounter medications on file as of 09/01/2020.    Functional Status:  In your present state of health, do you have any difficulty performing the following activities: 08/09/2020 02/11/2020  Hearing? Tempie Donning  Vision? N N  Difficulty concentrating or making decisions? Y N  Comment short term memory loss -  Walking or climbing stairs? N Y  Dressing or bathing? N N  Doing errands, shopping? N Y  Conservation officer, nature and eating ? N -  Using the Toilet? N -  In the past six months, have you accidently leaked urine? N -  Do you have problems with loss of bowel control? N -  Managing your Medications? N -  Managing your Finances? N -  Housekeeping or managing your Housekeeping? N -  Some recent data might be hidden    Fall/Depression Screening: Fall Risk  08/09/2020 01/06/2016 01/03/2016  Falls in the past year? 0 No No   PHQ 2/9 Scores 08/09/2020 01/03/2016  PHQ - 2 Score 0 0    Assessment:   Care Plan Care Plan : Diabetes Type 2 (Adult)  Updates made by Jon Billings,  RN since 09/01/2020 12:00 AM     Problem: Glycemic Management (Diabetes, Type 2)      Long-Range Goal: Glycemic Management Optimized as evidenced by A1c less than 7   Start Date: 08/09/2020  Expected End Date: 02/18/2021  This Visit's Progress: On track  Priority: High  Note:   Evidence-based guidance:  Anticipate use of antihyperglycemic with or without insulin and periodic adjustments; consider active involvement of pharmacist.  Provide medical nutrition therapy and development of individualized eating.  Compare self-reported symptoms of hypo or hyperglycemia to blood glucose levels, diet and fluid intake, current medications, psychosocial and physiologic stressors, change in activity and barriers to care adherence.  Promote self-monitoring of blood glucose levels.  Notes:  09/01/20 Patient reports sugars doing good. No morning shots recently.  Sugar ranging 80-95 in the am.  Encouraged to continue diabetic diet.      Task: Alleviate Barriers to Glycemic Management   Due Date: 02/18/2021  Priority: Routine  Responsible User: Jon Billings, RN  Note:   Care Management Activities:    - blood glucose monitoring encouraged - blood glucose readings reviewed - self-awareness of signs/symptoms of hypo or hyperglycemia encouraged - use of blood glucose monitoring log promoted    Notes: 08/09/20 Patient reports that his sugars run between 85-95 in the morning. Patient takes medications as prescribed.   Diabetes Management Discussed: Medication adherence Reviewed importance of limiting carbs such as rice, potatoes, breads, and pastas. Also discussed limiting sweets and sugary drinks.  Discussed importance of portion control.  Also discussed importance of annual exams, foot exams, and eye exams.    09/01/20 Patient reports sugars doing good. No morning shots recently.  Sugar ranging 80-95 in the am.  Encouraged to continue diabetic diet.  Reiterated diabetes management.         Goals  Addressed             This Visit's Progress    Monitor and Manage My Blood Sugar-Diabetes Type 2   On track    Barriers: Health Behaviors Knowledge Timeframe:  Long-Range Goal Priority:  High Start Date:   08/09/20                          Expected End Date:   02/18/21                    Follow Up Date 10/19/20   - check blood sugar at prescribed times - take the blood sugar log to all doctor visits    Why is this important?   Checking your blood sugar at home helps to keep it from getting very high  or very low.  Writing the results in a diary or log helps the doctor know how to care for you.  Your blood sugar log should have the time, date and the results.  Also, write down the amount of insulin or other medicine that you take.  Other information, like what you ate, exercise done and how you were feeling, will also be helpful.     Notes: Patient managing diabetes currently A1c 5.7.   Diabetes Management Discussed: Medication adherence Reviewed importance of limiting carbs such as rice, potatoes, breads, and pastas. Also discussed limiting sweets and sugary drinks.  Discussed importance of portion control.  Also discussed importance of annual exams, foot exams, and eye exams.   09/01/20 Patient reports sugars doing good. No morning shots recently.  Sugar ranging 80-95 in the am.  Encouraged to continue diabetic diet.  Reiterated diabetes management.         Plan:  Follow-up: Patient agrees to Care Plan and Follow-up. Follow-up in 6-8 week(s)  [name], RN, MSN [place] [facility] Care Management Coordinator Direct Line [phone] Cell [phone] Toll Free: [phone]  Fax: [phone]

## 2020-09-01 NOTE — Patient Instructions (Signed)
Goals Addressed             This Visit's Progress    Monitor and Manage My Blood Sugar-Diabetes Type 2   On track    Barriers: Health Behaviors Knowledge Timeframe:  Long-Range Goal Priority:  High Start Date:   08/09/20                          Expected End Date:   02/18/21                    Follow Up Date 10/19/20   - check blood sugar at prescribed times - take the blood sugar log to all doctor visits    Why is this important?   Checking your blood sugar at home helps to keep it from getting very high or very low.  Writing the results in a diary or log helps the doctor know how to care for you.  Your blood sugar log should have the time, date and the results.  Also, write down the amount of insulin or other medicine that you take.  Other information, like what you ate, exercise done and how you were feeling, will also be helpful.     Notes: Patient managing diabetes currently A1c 5.7.   Diabetes Management Discussed: Medication adherence Reviewed importance of limiting carbs such as rice, potatoes, breads, and pastas. Also discussed limiting sweets and sugary drinks.  Discussed importance of portion control.  Also discussed importance of annual exams, foot exams, and eye exams.   09/01/20 Patient reports sugars doing good. No morning shots recently.  Sugar ranging 80-95 in the am.  Encouraged to continue diabetic diet.  Reiterated diabetes management.

## 2020-09-06 ENCOUNTER — Ambulatory Visit: Payer: Self-pay

## 2020-09-07 DIAGNOSIS — E113293 Type 2 diabetes mellitus with mild nonproliferative diabetic retinopathy without macular edema, bilateral: Secondary | ICD-10-CM | POA: Diagnosis not present

## 2020-09-07 DIAGNOSIS — E1122 Type 2 diabetes mellitus with diabetic chronic kidney disease: Secondary | ICD-10-CM | POA: Diagnosis not present

## 2020-09-07 DIAGNOSIS — E1142 Type 2 diabetes mellitus with diabetic polyneuropathy: Secondary | ICD-10-CM | POA: Diagnosis not present

## 2020-09-07 DIAGNOSIS — N1831 Chronic kidney disease, stage 3a: Secondary | ICD-10-CM | POA: Diagnosis not present

## 2020-09-07 DIAGNOSIS — Z794 Long term (current) use of insulin: Secondary | ICD-10-CM | POA: Diagnosis not present

## 2020-09-07 DIAGNOSIS — E1159 Type 2 diabetes mellitus with other circulatory complications: Secondary | ICD-10-CM | POA: Diagnosis not present

## 2020-09-15 ENCOUNTER — Other Ambulatory Visit: Payer: Self-pay

## 2020-09-15 ENCOUNTER — Observation Stay
Admission: EM | Admit: 2020-09-15 | Discharge: 2020-09-16 | Disposition: A | Discharge status: Home / Self Care | Payer: Medicare HMO | Attending: Internal Medicine | Admitting: Internal Medicine

## 2020-09-15 ENCOUNTER — Emergency Department: Payer: Medicare HMO

## 2020-09-15 DIAGNOSIS — Z87891 Personal history of nicotine dependence: Secondary | ICD-10-CM | POA: Insufficient documentation

## 2020-09-15 DIAGNOSIS — F0280 Dementia in other diseases classified elsewhere without behavioral disturbance: Secondary | ICD-10-CM | POA: Diagnosis present

## 2020-09-15 DIAGNOSIS — I10 Essential (primary) hypertension: Secondary | ICD-10-CM | POA: Diagnosis present

## 2020-09-15 DIAGNOSIS — G309 Alzheimer's disease, unspecified: Secondary | ICD-10-CM | POA: Diagnosis not present

## 2020-09-15 DIAGNOSIS — N179 Acute kidney failure, unspecified: Secondary | ICD-10-CM | POA: Diagnosis not present

## 2020-09-15 DIAGNOSIS — R0902 Hypoxemia: Secondary | ICD-10-CM | POA: Diagnosis not present

## 2020-09-15 DIAGNOSIS — Z20822 Contact with and (suspected) exposure to covid-19: Secondary | ICD-10-CM | POA: Insufficient documentation

## 2020-09-15 DIAGNOSIS — E1122 Type 2 diabetes mellitus with diabetic chronic kidney disease: Secondary | ICD-10-CM | POA: Insufficient documentation

## 2020-09-15 DIAGNOSIS — Z794 Long term (current) use of insulin: Secondary | ICD-10-CM | POA: Diagnosis not present

## 2020-09-15 DIAGNOSIS — I5032 Chronic diastolic (congestive) heart failure: Secondary | ICD-10-CM | POA: Insufficient documentation

## 2020-09-15 DIAGNOSIS — Z79899 Other long term (current) drug therapy: Secondary | ICD-10-CM | POA: Diagnosis not present

## 2020-09-15 DIAGNOSIS — K56609 Unspecified intestinal obstruction, unspecified as to partial versus complete obstruction: Principal | ICD-10-CM | POA: Diagnosis present

## 2020-09-15 DIAGNOSIS — R1084 Generalized abdominal pain: Secondary | ICD-10-CM | POA: Diagnosis not present

## 2020-09-15 DIAGNOSIS — I255 Ischemic cardiomyopathy: Secondary | ICD-10-CM | POA: Diagnosis present

## 2020-09-15 DIAGNOSIS — F0150 Vascular dementia without behavioral disturbance: Secondary | ICD-10-CM | POA: Diagnosis present

## 2020-09-15 DIAGNOSIS — R140 Abdominal distension (gaseous): Secondary | ICD-10-CM | POA: Diagnosis not present

## 2020-09-15 DIAGNOSIS — I130 Hypertensive heart and chronic kidney disease with heart failure and stage 1 through stage 4 chronic kidney disease, or unspecified chronic kidney disease: Secondary | ICD-10-CM | POA: Diagnosis not present

## 2020-09-15 DIAGNOSIS — I2510 Atherosclerotic heart disease of native coronary artery without angina pectoris: Secondary | ICD-10-CM | POA: Insufficient documentation

## 2020-09-15 DIAGNOSIS — I5022 Chronic systolic (congestive) heart failure: Secondary | ICD-10-CM | POA: Diagnosis present

## 2020-09-15 DIAGNOSIS — R52 Pain, unspecified: Secondary | ICD-10-CM | POA: Diagnosis not present

## 2020-09-15 DIAGNOSIS — Z96653 Presence of artificial knee joint, bilateral: Secondary | ICD-10-CM | POA: Insufficient documentation

## 2020-09-15 DIAGNOSIS — R109 Unspecified abdominal pain: Secondary | ICD-10-CM | POA: Diagnosis not present

## 2020-09-15 DIAGNOSIS — Z7984 Long term (current) use of oral hypoglycemic drugs: Secondary | ICD-10-CM | POA: Insufficient documentation

## 2020-09-15 DIAGNOSIS — N183 Chronic kidney disease, stage 3 unspecified: Secondary | ICD-10-CM | POA: Insufficient documentation

## 2020-09-15 DIAGNOSIS — I959 Hypotension, unspecified: Secondary | ICD-10-CM | POA: Diagnosis not present

## 2020-09-15 DIAGNOSIS — R1031 Right lower quadrant pain: Secondary | ICD-10-CM | POA: Diagnosis present

## 2020-09-15 DIAGNOSIS — E119 Type 2 diabetes mellitus without complications: Secondary | ICD-10-CM

## 2020-09-15 LAB — CBC
HCT: 38 % — ABNORMAL LOW (ref 39.0–52.0)
Hemoglobin: 13 g/dL (ref 13.0–17.0)
MCH: 31.2 pg (ref 26.0–34.0)
MCHC: 34.2 g/dL (ref 30.0–36.0)
MCV: 91.1 fL (ref 80.0–100.0)
Platelets: 105 10*3/uL — ABNORMAL LOW (ref 150–400)
RBC: 4.17 MIL/uL — ABNORMAL LOW (ref 4.22–5.81)
RDW: 14.9 % (ref 11.5–15.5)
WBC: 4.9 10*3/uL (ref 4.0–10.5)
nRBC: 0 % (ref 0.0–0.2)

## 2020-09-15 LAB — COMPREHENSIVE METABOLIC PANEL
ALT: 17 U/L (ref 0–44)
AST: 27 U/L (ref 15–41)
Albumin: 3.6 g/dL (ref 3.5–5.0)
Alkaline Phosphatase: 54 U/L (ref 38–126)
Anion gap: 7 (ref 5–15)
BUN: 31 mg/dL — ABNORMAL HIGH (ref 8–23)
CO2: 27 mmol/L (ref 22–32)
Calcium: 8.2 mg/dL — ABNORMAL LOW (ref 8.9–10.3)
Chloride: 106 mmol/L (ref 98–111)
Creatinine, Ser: 1.66 mg/dL — ABNORMAL HIGH (ref 0.61–1.24)
GFR, Estimated: 44 mL/min — ABNORMAL LOW (ref >60)
Glucose, Bld: 87 mg/dL (ref 70–99)
Potassium: 5.2 mmol/L — ABNORMAL HIGH (ref 3.5–5.1)
Sodium: 140 mmol/L (ref 135–145)
Total Bilirubin: 0.9 mg/dL (ref 0.3–1.2)
Total Protein: 6.5 g/dL (ref 6.5–8.1)

## 2020-09-15 LAB — URINALYSIS, COMPLETE (UACMP) WITH MICROSCOPIC
Bacteria, UA: NONE SEEN
Bilirubin Urine: NEGATIVE
Glucose, UA: NEGATIVE mg/dL
Hgb urine dipstick: NEGATIVE
Ketones, ur: NEGATIVE mg/dL
Leukocytes,Ua: NEGATIVE
Nitrite: NEGATIVE
Protein, ur: NEGATIVE mg/dL
Specific Gravity, Urine: 1.024 (ref 1.005–1.030)
pH: 7 (ref 5.0–8.0)

## 2020-09-15 LAB — LIPASE, BLOOD: Lipase: 36 U/L (ref 11–51)

## 2020-09-15 LAB — CBC WITH DIFFERENTIAL/PLATELET
Abs Immature Granulocytes: 0.03 10*3/uL (ref 0.00–0.07)
Basophils Absolute: 0 10*3/uL (ref 0.0–0.1)
Basophils Relative: 1 %
Eosinophils Absolute: 0.2 10*3/uL (ref 0.0–0.5)
Eosinophils Relative: 3 %
HCT: 39.5 % (ref 39.0–52.0)
Hemoglobin: 13.5 g/dL (ref 13.0–17.0)
Immature Granulocytes: 0 %
Lymphocytes Relative: 11 %
Lymphs Abs: 0.9 10*3/uL (ref 0.7–4.0)
MCH: 31 pg (ref 26.0–34.0)
MCHC: 34.2 g/dL (ref 30.0–36.0)
MCV: 90.6 fL (ref 80.0–100.0)
Monocytes Absolute: 1.3 10*3/uL — ABNORMAL HIGH (ref 0.1–1.0)
Monocytes Relative: 15 %
Neutro Abs: 5.9 10*3/uL (ref 1.7–7.7)
Neutrophils Relative %: 70 %
Platelets: 137 10*3/uL — ABNORMAL LOW (ref 150–400)
RBC: 4.36 MIL/uL (ref 4.22–5.81)
RDW: 14.7 % (ref 11.5–15.5)
WBC: 8.4 10*3/uL (ref 4.0–10.5)
nRBC: 0 % (ref 0.0–0.2)

## 2020-09-15 LAB — CREATININE, SERUM
Creatinine, Ser: 1.48 mg/dL — ABNORMAL HIGH (ref 0.61–1.24)
GFR, Estimated: 50 mL/min — ABNORMAL LOW (ref >60)

## 2020-09-15 LAB — LACTIC ACID, PLASMA
Lactic Acid, Venous: 1.7 mmol/L (ref 0.5–1.9)
Lactic Acid, Venous: 1 mmol/L (ref 0.5–1.9)

## 2020-09-15 LAB — CBG MONITORING, ED: Glucose-Capillary: 72 mg/dL (ref 70–99)

## 2020-09-15 LAB — TROPONIN I (HIGH SENSITIVITY)
Troponin I (High Sensitivity): 7 ng/L (ref <18)
Troponin I (High Sensitivity): 5 ng/L (ref <18)

## 2020-09-15 MED ORDER — MORPHINE SULFATE (PF) 2 MG/ML IV SOLN
2.0000 mg | INTRAVENOUS | Status: DC | PRN
  Administered 2020-09-15 – 2020-09-16 (×3): 2 mg via INTRAVENOUS
  Filled 2020-09-15 (×3): qty 1

## 2020-09-15 MED ORDER — FENTANYL CITRATE (PF) 100 MCG/2ML IJ SOLN
50.0000 ug | Freq: Once | INTRAMUSCULAR | Status: AC
  Administered 2020-09-15: 50 ug via INTRAVENOUS
  Filled 2020-09-15: qty 2

## 2020-09-15 MED ORDER — INSULIN ASPART 100 UNIT/ML IJ SOLN: 0.0000 [IU] | INTRAMUSCULAR | Status: DC

## 2020-09-15 MED ORDER — IOHEXOL 350 MG/ML SOLN
75.0000 mL | Freq: Once | INTRAVENOUS | Status: AC | PRN
  Administered 2020-09-15: 75 mL via INTRAVENOUS

## 2020-09-15 MED ORDER — LACTATED RINGERS IV SOLN: INTRAVENOUS | Status: DC

## 2020-09-15 MED ORDER — ONDANSETRON HCL 4 MG/2ML IJ SOLN: 4.0000 mg | Freq: Four times a day (QID) | INTRAMUSCULAR | Status: DC | PRN

## 2020-09-15 MED ORDER — ONDANSETRON HCL 4 MG PO TABS: 4.0000 mg | ORAL_TABLET | Freq: Four times a day (QID) | ORAL | Status: DC | PRN

## 2020-09-15 MED ORDER — ENOXAPARIN SODIUM 60 MG/0.6ML IJ SOSY
0.5000 mg/kg | PREFILLED_SYRINGE | INTRAMUSCULAR | Status: DC
  Administered 2020-09-15: 50 mg via SUBCUTANEOUS
  Filled 2020-09-15: qty 0.6

## 2020-09-15 MED ORDER — SODIUM CHLORIDE 0.9 % IV BOLUS
1000.0000 mL | Freq: Once | INTRAVENOUS | Status: AC
  Administered 2020-09-15: 1000 mL via INTRAVENOUS

## 2020-09-15 NOTE — Progress Notes (Signed)
PHARMACIST - PHYSICIAN COMMUNICATION  CONCERNING:  Enoxaparin (Lovenox) for DVT Prophylaxis    RECOMMENDATION: Patient was prescribed enoxaprin '40mg'$  q24 hours for VTE prophylaxis.   Filed Weights   09/15/20 1454  Weight: 102.1 kg (225 lb)    Body mass index is 31.38 kg/m.  Estimated Creatinine Clearance: 49.6 mL/min (A) (by C-G formula based on SCr of 1.66 mg/dL (H)).   Based on [facility] patient is candidate for enoxaparin 0.'5mg'$ /kg TBW SQ every 24 hours based on BMI being >30.  DESCRIPTION: Pharmacy has adjusted enoxaparin dose per [facility] policy.  Patient is now receiving enoxaparin 0.5 mg/kg every 24 hours    Renda Rolls, PharmD, [facility] 09/15/2020 9:43 PM

## 2020-09-15 NOTE — ED Notes (Signed)
Pt reporting increasing lower abdominal pain with pain beginning to radiate down bilateral legs. Dr Cheri Fowler notified.
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
## 2020-09-15 NOTE — ED Triage Notes (Signed)
Pt arrives to ED from home via [facility] with c/c of abdominal pain/distention, and hypotension. Pt states Sx began this morning. Last bowel movement yesterday reported normal, denies any urinary Sx. EMS reports transport vitals of 82/53 after 500 mL NS, p80, O2 sat 95% on room air, oral temp 97.7, CBG 103. Upon arrival, pt A&Ox4 NAD.
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
## 2020-09-16 ENCOUNTER — Encounter: Payer: Self-pay | Admitting: Internal Medicine

## 2020-09-16 DIAGNOSIS — K56609 Unspecified intestinal obstruction, unspecified as to partial versus complete obstruction: Secondary | ICD-10-CM | POA: Diagnosis not present

## 2020-09-16 LAB — CBC
HCT: 40 % (ref 39.0–52.0)
Hemoglobin: 13.3 g/dL (ref 13.0–17.0)
MCH: 29.9 pg (ref 26.0–34.0)
MCHC: 33.3 g/dL (ref 30.0–36.0)
MCV: 89.9 fL (ref 80.0–100.0)
Platelets: 106 10*3/uL — ABNORMAL LOW (ref 150–400)
RBC: 4.45 MIL/uL (ref 4.22–5.81)
RDW: 15 % (ref 11.5–15.5)
WBC: 4.8 10*3/uL (ref 4.0–10.5)
nRBC: 0 % (ref 0.0–0.2)

## 2020-09-16 LAB — COMPREHENSIVE METABOLIC PANEL
ALT: 16 U/L (ref 0–44)
AST: 25 U/L (ref 15–41)
Albumin: 3.7 g/dL (ref 3.5–5.0)
Alkaline Phosphatase: 60 U/L (ref 38–126)
Anion gap: 7 (ref 5–15)
BUN: 27 mg/dL — ABNORMAL HIGH (ref 8–23)
CO2: 23 mmol/L (ref 22–32)
Calcium: 8.6 mg/dL — ABNORMAL LOW (ref 8.9–10.3)
Chloride: 106 mmol/L (ref 98–111)
Creatinine, Ser: 1.19 mg/dL (ref 0.61–1.24)
GFR, Estimated: >60 mL/min (ref >60)
Glucose, Bld: 99 mg/dL (ref 70–99)
Potassium: 5.1 mmol/L (ref 3.5–5.1)
Sodium: 136 mmol/L (ref 135–145)
Total Bilirubin: 1.1 mg/dL (ref 0.3–1.2)
Total Protein: 7 g/dL (ref 6.5–8.1)

## 2020-09-16 LAB — GLUCOSE, CAPILLARY
Glucose-Capillary: 95 mg/dL (ref 70–99)
Glucose-Capillary: 88 mg/dL (ref 70–99)

## 2020-09-16 LAB — CBG MONITORING, ED
Glucose-Capillary: 60 mg/dL — ABNORMAL LOW (ref 70–99)
Glucose-Capillary: 73 mg/dL (ref 70–99)
Glucose-Capillary: 78 mg/dL (ref 70–99)
Glucose-Capillary: 68 mg/dL — ABNORMAL LOW (ref 70–99)

## 2020-09-16 LAB — SARS CORONAVIRUS 2 (TAT 6-24 HRS): SARS Coronavirus 2: NEGATIVE

## 2020-09-16 MED ORDER — METFORMIN HCL ER 750 MG PO TB24: 1500.0000 mg | ORAL_TABLET | Freq: Every day | ORAL | Status: DC

## 2020-09-16 MED ORDER — DOXEPIN HCL 100 MG PO CAPS: 100.0000 mg | ORAL_CAPSULE | ORAL | Status: DC

## 2020-09-16 MED ORDER — CARVEDILOL 6.25 MG PO TABS: 3.1250 mg | ORAL_TABLET | Freq: Two times a day (BID) | ORAL | Status: DC

## 2020-09-16 MED ORDER — DIAZEPAM 5 MG PO TABS: 5.0000 mg | ORAL_TABLET | Freq: Two times a day (BID) | ORAL | Status: DC | PRN

## 2020-09-16 MED ORDER — ADULT MULTIVITAMIN W/MINERALS CH: 1.0000 | ORAL_TABLET | Freq: Every day | ORAL | Status: DC

## 2020-09-16 MED ORDER — NITROGLYCERIN 0.4 MG SL SUBL: 0.4000 mg | SUBLINGUAL_TABLET | SUBLINGUAL | Status: DC | PRN

## 2020-09-16 MED ORDER — DONEPEZIL HCL 5 MG PO TABS
10.0000 mg | ORAL_TABLET | Freq: Every day | ORAL | Status: DC
  Filled 2020-09-16: qty 2

## 2020-09-16 MED ORDER — GEMFIBROZIL 600 MG PO TABS
600.0000 mg | ORAL_TABLET | Freq: Two times a day (BID) | ORAL | Status: DC
  Filled 2020-09-16 (×2): qty 1

## 2020-09-16 MED ORDER — DOXEPIN HCL 100 MG PO CAPS: 100.0000 mg | ORAL_CAPSULE | Freq: Every morning | ORAL | Status: DC

## 2020-09-16 MED ORDER — ACETAMINOPHEN 325 MG PO TABS: 650.0000 mg | ORAL_TABLET | Freq: Four times a day (QID) | ORAL | Status: DC | PRN

## 2020-09-16 MED ORDER — DEXTROSE 50 % IV SOLN
12.5000 g | Freq: Once | INTRAVENOUS | Status: AC
  Administered 2020-09-16: 12.5 g via INTRAVENOUS

## 2020-09-16 MED ORDER — PREGABALIN 75 MG PO CAPS: 300.0000 mg | ORAL_CAPSULE | Freq: Two times a day (BID) | ORAL | Status: DC

## 2020-09-16 MED ORDER — ATORVASTATIN CALCIUM 20 MG PO TABS: 40.0000 mg | ORAL_TABLET | Freq: Every day | ORAL | Status: DC

## 2020-09-16 MED ORDER — DEXTROSE 50 % IV SOLN
INTRAVENOUS | Status: AC
  Filled 2020-09-16: qty 50

## 2020-09-16 MED ORDER — ISOSORBIDE DINITRATE 30 MG PO TABS
30.0000 mg | ORAL_TABLET | Freq: Three times a day (TID) | ORAL | Status: DC
  Filled 2020-09-16 (×2): qty 1

## 2020-09-16 MED ORDER — DOXEPIN HCL 100 MG PO CAPS
200.0000 mg | ORAL_CAPSULE | Freq: Every day | ORAL | Status: DC
  Filled 2020-09-16: qty 2

## 2020-09-16 MED ORDER — LISINOPRIL 10 MG PO TABS: 10.0000 mg | ORAL_TABLET | Freq: Every day | ORAL | Status: DC

## 2020-09-16 MED ORDER — SALINE SPRAY 0.65 % NA SOLN
1.0000 | Freq: Two times a day (BID) | NASAL | Status: DC | PRN
  Filled 2020-09-16: qty 44

## 2020-09-16 MED ORDER — FAMOTIDINE 20 MG PO TABS: 40.0000 mg | ORAL_TABLET | Freq: Every day | ORAL | Status: DC
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
## 2020-09-16 NOTE — ED Notes (Signed)
Informed RN bed assigned 

## 2020-09-16 NOTE — Care Management CC44 (Signed)
Condition Code 44 Documentation Completed  Patient Details  Name: ABDULKAREEM GOODLY MRN: [id number] Date of Birth: 1950-09-30   Condition Code 44 given:  Yes Patient signature on Condition Code 44 notice:  Yes Documentation of 2 MD's agreement:  Yes Code 44 added to claim:  Yes    Kerin Salen, RN 09/16/2020, 4:15 PM

## 2020-09-16 NOTE — Plan of Care (Signed)
Problem: Education: Goal: Knowledge of General Education information will improve Description: Including pain rating scale, medication(s)/side effects and non-pharmacologic comfort measures 09/16/2020 1634 by Felipa Emory, RN Outcome: Adequate for Discharge 09/16/2020 1634 by Felipa Emory, RN Outcome: Progressing 09/16/2020 1634 by Felipa Emory, RN Outcome: Progressing   Problem: Health Behavior/Discharge Planning: Goal: Ability to manage health-related needs will improve 09/16/2020 1634 by Felipa Emory, RN Outcome: Adequate for Discharge 09/16/2020 1634 by Felipa Emory, RN Outcome: Progressing 09/16/2020 1634 by Felipa Emory, RN Outcome: Progressing   Problem: Clinical Measurements: Goal: Ability to maintain clinical measurements within normal limits will improve 09/16/2020 1634 by Felipa Emory, RN Outcome: Adequate for Discharge 09/16/2020 1634 by Felipa Emory, RN Outcome: Progressing 09/16/2020 1634 by Felipa Emory, RN Outcome: Progressing Goal: Will remain free from infection 09/16/2020 1634 by Felipa Emory, RN Outcome: Adequate for Discharge 09/16/2020 1634 by Felipa Emory, RN Outcome: Progressing 09/16/2020 1634 by Felipa Emory, RN Outcome: Progressing Goal: Diagnostic test results will improve 09/16/2020 1634 by Felipa Emory, RN Outcome: Adequate for Discharge 09/16/2020 1634 by Felipa Emory, RN Outcome: Progressing 09/16/2020 1634 by Felipa Emory, RN Outcome: Progressing Goal: Respiratory complications will improve 09/16/2020 1634 by Felipa Emory, RN Outcome: Adequate for Discharge 09/16/2020 1634 by Felipa Emory, RN Outcome: Progressing 09/16/2020 1634 by Felipa Emory, RN Outcome: Progressing Goal: Cardiovascular complication will be avoided 09/16/2020 1634 by Felipa Emory, RN Outcome: Adequate for Discharge 09/16/2020 1634 by Felipa Emory, RN Outcome:  Progressing 09/16/2020 1634 by Felipa Emory, RN Outcome: Progressing   Problem: Activity: Goal: Risk for activity intolerance will decrease 09/16/2020 1634 by Felipa Emory, RN Outcome: Adequate for Discharge 09/16/2020 1634 by Felipa Emory, RN Outcome: Progressing 09/16/2020 1634 by Felipa Emory, RN Outcome: Progressing   Problem: Nutrition: Goal: Adequate nutrition will be maintained 09/16/2020 1634 by Felipa Emory, RN Outcome: Adequate for Discharge 09/16/2020 1634 by Felipa Emory, RN Outcome: Progressing 09/16/2020 1634 by Felipa Emory, RN Outcome: Progressing   Problem: Coping: Goal: Level of anxiety will decrease 09/16/2020 1634 by Felipa Emory, RN Outcome: Adequate for Discharge 09/16/2020 1634 by Felipa Emory, RN Outcome: Progressing 09/16/2020 1634 by Felipa Emory, RN Outcome: Progressing   Problem: Elimination: Goal: Will not experience complications related to bowel motility 09/16/2020 1634 by Felipa Emory, RN Outcome: Adequate for Discharge 09/16/2020 1634 by Felipa Emory, RN Outcome: Progressing 09/16/2020 1634 by Felipa Emory, RN Outcome: Progressing Goal: Will not experience complications related to urinary retention 09/16/2020 1634 by Felipa Emory, RN Outcome: Adequate for Discharge 09/16/2020 1634 by Felipa Emory, RN Outcome: Progressing 09/16/2020 1634 by Felipa Emory, RN Outcome: Progressing   Problem: Pain Managment: Goal: General experience of comfort will improve 09/16/2020 1634 by Felipa Emory, RN Outcome: Adequate for Discharge 09/16/2020 1634 by Felipa Emory, RN Outcome: Progressing 09/16/2020 1634 by Felipa Emory, RN Outcome: Progressing   Problem: Safety: Goal: Ability to remain free from injury will improve 09/16/2020 1634 by Felipa Emory, RN Outcome: Adequate for Discharge 09/16/2020 1634 by Felipa Emory, RN Outcome:  Progressing 09/16/2020 1634 by Felipa Emory, RN Outcome: Progressing   Problem: Skin Integrity: Goal: Risk for impaired skin integrity will decrease 09/16/2020 1634 by Felipa Emory, RN Outcome: Adequate for Discharge 09/16/2020 1634 by Felipa Emory, RN Outcome: Progressing 09/16/2020 1634 by Felipa Emory, RN  Outcome: Progressing   

## 2020-09-16 NOTE — Care Management CC44 (Signed)
Condition Code 44 Documentation Completed  Patient Details  Name: Todd Spencer MRN: [id number] Date of Birth: August 18, 1950   Condition Code 44 given:  Yes Patient signature on Condition Code 44 notice:  Yes Documentation of 2 MD's agreement:  Yes Code 44 added to claim:  Yes    Kerin Salen, RN 09/16/2020, 4:18 PM
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
## 2020-09-16 NOTE — Discharge Instructions (Signed)
Patient advised all his sugars during the hospital stay were less than hundred. Holding his diabetes medicines including insulin. Patient is to continue check sugars at home and once it starts going about 120 resume insulin from 1/2 the original dose. If he has any questions regarding his insulin dosage needs to call his primary care physician Dr. Loyal Buba

## 2020-09-16 NOTE — Plan of Care (Signed)
  Problem: Education: Goal: Knowledge of General Education information will improve Description: Including pain rating scale, medication(s)/side effects and non-pharmacologic comfort measures 09/16/2020 1634 by Felipa Emory, RN Outcome: Progressing 09/16/2020 1634 by Felipa Emory, RN Outcome: Progressing   Problem: Health Behavior/Discharge Planning: Goal: Ability to manage health-related needs will improve 09/16/2020 1634 by Felipa Emory, RN Outcome: Progressing 09/16/2020 1634 by Felipa Emory, RN Outcome: Progressing   Problem: Clinical Measurements: Goal: Ability to maintain clinical measurements within normal limits will improve 09/16/2020 1634 by Felipa Emory, RN Outcome: Progressing 09/16/2020 1634 by Felipa Emory, RN Outcome: Progressing Goal: Will remain free from infection 09/16/2020 1634 by Felipa Emory, RN Outcome: Progressing 09/16/2020 1634 by Felipa Emory, RN Outcome: Progressing Goal: Diagnostic test results will improve 09/16/2020 1634 by Felipa Emory, RN Outcome: Progressing 09/16/2020 1634 by Felipa Emory, RN Outcome: Progressing Goal: Respiratory complications will improve 09/16/2020 1634 by Felipa Emory, RN Outcome: Progressing 09/16/2020 1634 by Felipa Emory, RN Outcome: Progressing Goal: Cardiovascular complication will be avoided 09/16/2020 1634 by Felipa Emory, RN Outcome: Progressing 09/16/2020 1634 by Felipa Emory, RN Outcome: Progressing   Problem: Activity: Goal: Risk for activity intolerance will decrease 09/16/2020 1634 by Felipa Emory, RN Outcome: Progressing 09/16/2020 1634 by Felipa Emory, RN Outcome: Progressing   Problem: Nutrition: Goal: Adequate nutrition will be maintained 09/16/2020 1634 by Felipa Emory, RN Outcome: Progressing 09/16/2020 1634 by Felipa Emory, RN Outcome: Progressing   Problem: Coping: Goal: Level of anxiety will  decrease 09/16/2020 1634 by Felipa Emory, RN Outcome: Progressing 09/16/2020 1634 by Felipa Emory, RN Outcome: Progressing   Problem: Elimination: Goal: Will not experience complications related to bowel motility 09/16/2020 1634 by Felipa Emory, RN Outcome: Progressing 09/16/2020 1634 by Felipa Emory, RN Outcome: Progressing Goal: Will not experience complications related to urinary retention 09/16/2020 1634 by Felipa Emory, RN Outcome: Progressing 09/16/2020 1634 by Felipa Emory, RN Outcome: Progressing   Problem: Pain Managment: Goal: General experience of comfort will improve 09/16/2020 1634 by Felipa Emory, RN Outcome: Progressing 09/16/2020 1634 by Felipa Emory, RN Outcome: Progressing   Problem: Safety: Goal: Ability to remain free from injury will improve 09/16/2020 1634 by Felipa Emory, RN Outcome: Progressing 09/16/2020 1634 by Felipa Emory, RN Outcome: Progressing   Problem: Skin Integrity: Goal: Risk for impaired skin integrity will decrease 09/16/2020 1634 by Felipa Emory, RN Outcome: Progressing 09/16/2020 1634 by Felipa Emory, RN Outcome: Progressing

## 2020-09-17 LAB — HEMOGLOBIN A1C
Hgb A1c MFr Bld: 6.3 % — ABNORMAL HIGH (ref 4.8–5.6)
Mean Plasma Glucose: 134 mg/dL

## 2020-09-19 DIAGNOSIS — H34831 Tributary (branch) retinal vein occlusion, right eye, with macular edema: Secondary | ICD-10-CM | POA: Diagnosis not present

## 2020-09-21 DIAGNOSIS — M1711 Unilateral primary osteoarthritis, right knee: Secondary | ICD-10-CM | POA: Diagnosis not present

## 2020-09-21 DIAGNOSIS — Z96651 Presence of right artificial knee joint: Secondary | ICD-10-CM | POA: Diagnosis not present

## 2020-10-06 ENCOUNTER — Emergency Department
Admission: EM | Admit: 2020-10-06 | Discharge: 2020-10-07 | Disposition: A | Discharge status: Home / Self Care | Payer: Medicare HMO | Attending: Emergency Medicine | Admitting: Emergency Medicine

## 2020-10-06 ENCOUNTER — Encounter: Payer: Self-pay | Admitting: Emergency Medicine

## 2020-10-06 ENCOUNTER — Other Ambulatory Visit: Payer: Self-pay

## 2020-10-06 DIAGNOSIS — F0280 Dementia in other diseases classified elsewhere without behavioral disturbance: Secondary | ICD-10-CM | POA: Insufficient documentation

## 2020-10-06 DIAGNOSIS — E1122 Type 2 diabetes mellitus with diabetic chronic kidney disease: Secondary | ICD-10-CM | POA: Diagnosis not present

## 2020-10-06 DIAGNOSIS — G309 Alzheimer's disease, unspecified: Secondary | ICD-10-CM | POA: Diagnosis not present

## 2020-10-06 DIAGNOSIS — N183 Chronic kidney disease, stage 3 unspecified: Secondary | ICD-10-CM | POA: Insufficient documentation

## 2020-10-06 DIAGNOSIS — R1030 Lower abdominal pain, unspecified: Secondary | ICD-10-CM

## 2020-10-06 DIAGNOSIS — R109 Unspecified abdominal pain: Secondary | ICD-10-CM | POA: Diagnosis not present

## 2020-10-06 DIAGNOSIS — I5022 Chronic systolic (congestive) heart failure: Secondary | ICD-10-CM | POA: Insufficient documentation

## 2020-10-06 DIAGNOSIS — K5792 Diverticulitis of intestine, part unspecified, without perforation or abscess without bleeding: Secondary | ICD-10-CM | POA: Insufficient documentation

## 2020-10-06 DIAGNOSIS — E1140 Type 2 diabetes mellitus with diabetic neuropathy, unspecified: Secondary | ICD-10-CM | POA: Insufficient documentation

## 2020-10-06 DIAGNOSIS — E162 Hypoglycemia, unspecified: Secondary | ICD-10-CM | POA: Diagnosis not present

## 2020-10-06 DIAGNOSIS — R001 Bradycardia, unspecified: Secondary | ICD-10-CM | POA: Diagnosis not present

## 2020-10-06 DIAGNOSIS — Z96653 Presence of artificial knee joint, bilateral: Secondary | ICD-10-CM | POA: Insufficient documentation

## 2020-10-06 DIAGNOSIS — I499 Cardiac arrhythmia, unspecified: Secondary | ICD-10-CM | POA: Diagnosis not present

## 2020-10-06 DIAGNOSIS — Z87891 Personal history of nicotine dependence: Secondary | ICD-10-CM | POA: Diagnosis not present

## 2020-10-06 DIAGNOSIS — R0902 Hypoxemia: Secondary | ICD-10-CM | POA: Diagnosis not present

## 2020-10-06 DIAGNOSIS — Z79899 Other long term (current) drug therapy: Secondary | ICD-10-CM | POA: Insufficient documentation

## 2020-10-06 DIAGNOSIS — I130 Hypertensive heart and chronic kidney disease with heart failure and stage 1 through stage 4 chronic kidney disease, or unspecified chronic kidney disease: Secondary | ICD-10-CM | POA: Diagnosis not present

## 2020-10-06 DIAGNOSIS — Z7982 Long term (current) use of aspirin: Secondary | ICD-10-CM | POA: Insufficient documentation

## 2020-10-06 DIAGNOSIS — I2510 Atherosclerotic heart disease of native coronary artery without angina pectoris: Secondary | ICD-10-CM | POA: Diagnosis not present

## 2020-10-06 DIAGNOSIS — R1084 Generalized abdominal pain: Secondary | ICD-10-CM | POA: Diagnosis not present

## 2020-10-06 DIAGNOSIS — E161 Other hypoglycemia: Secondary | ICD-10-CM | POA: Diagnosis not present

## 2020-10-06 LAB — CBC
HCT: 36.4 % — ABNORMAL LOW (ref 39.0–52.0)
Hemoglobin: 12.4 g/dL — ABNORMAL LOW (ref 13.0–17.0)
MCH: 31.2 pg (ref 26.0–34.0)
MCHC: 34.1 g/dL (ref 30.0–36.0)
MCV: 91.5 fL (ref 80.0–100.0)
Platelets: 119 10*3/uL — ABNORMAL LOW (ref 150–400)
RBC: 3.98 MIL/uL — ABNORMAL LOW (ref 4.22–5.81)
RDW: 14.8 % (ref 11.5–15.5)
WBC: 6.5 10*3/uL (ref 4.0–10.5)
nRBC: 0 % (ref 0.0–0.2)

## 2020-10-06 LAB — COMPREHENSIVE METABOLIC PANEL
ALT: 13 U/L (ref 0–44)
AST: 20 U/L (ref 15–41)
Albumin: 4.3 g/dL (ref 3.5–5.0)
Alkaline Phosphatase: 73 U/L (ref 38–126)
Anion gap: 7 (ref 5–15)
BUN: 29 mg/dL — ABNORMAL HIGH (ref 8–23)
CO2: 24 mmol/L (ref 22–32)
Calcium: 9.5 mg/dL (ref 8.9–10.3)
Chloride: 109 mmol/L (ref 98–111)
Creatinine, Ser: 1.05 mg/dL (ref 0.61–1.24)
GFR, Estimated: >60 mL/min (ref >60)
Glucose, Bld: 77 mg/dL (ref 70–99)
Potassium: 4.3 mmol/L (ref 3.5–5.1)
Sodium: 140 mmol/L (ref 135–145)
Total Bilirubin: 0.6 mg/dL (ref 0.3–1.2)
Total Protein: 7.6 g/dL (ref 6.5–8.1)

## 2020-10-06 LAB — LIPASE, BLOOD: Lipase: 55 U/L — ABNORMAL HIGH (ref 11–51)

## 2020-10-06 MED ORDER — MORPHINE SULFATE (PF) 4 MG/ML IV SOLN
4.0000 mg | Freq: Once | INTRAVENOUS | Status: AC
  Administered 2020-10-07: 4 mg via INTRAVENOUS
  Filled 2020-10-06: qty 1

## 2020-10-06 MED ORDER — ONDANSETRON HCL 4 MG/2ML IJ SOLN
4.0000 mg | Freq: Once | INTRAMUSCULAR | Status: AC
  Administered 2020-10-07: 4 mg via INTRAVENOUS
  Filled 2020-10-06: qty 2

## 2020-10-06 MED ORDER — SODIUM CHLORIDE 0.9 % IV BOLUS
500.0000 mL | Freq: Once | INTRAVENOUS | Status: AC
  Administered 2020-10-06: 500 mL via INTRAVENOUS

## 2020-10-06 NOTE — ED Triage Notes (Signed)
Pt comes into the ED via [facility] from home c/o lowe abd pain.  Pt has BM this morning with no relief.  H/o bowel obstruction last spring. Pt also has extensive cardiac history.  45 HR 128/70 CBG 72 Afebrile
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
## 2020-10-07 ENCOUNTER — Emergency Department: Payer: Medicare HMO

## 2020-10-07 DIAGNOSIS — R109 Unspecified abdominal pain: Secondary | ICD-10-CM | POA: Diagnosis not present

## 2020-10-07 LAB — URINALYSIS, COMPLETE (UACMP) WITH MICROSCOPIC
Bacteria, UA: NONE SEEN
Bilirubin Urine: NEGATIVE
Glucose, UA: NEGATIVE mg/dL
Ketones, ur: NEGATIVE mg/dL
Leukocytes,Ua: NEGATIVE
Nitrite: NEGATIVE
Protein, ur: NEGATIVE mg/dL
Specific Gravity, Urine: 1.013 (ref 1.005–1.030)
Squamous Epithelial / HPF: NONE SEEN (ref 0–5)
pH: 5 (ref 5.0–8.0)

## 2020-10-07 MED ORDER — IOHEXOL 9 MG/ML PO SOLN
500.0000 mL | ORAL | Status: AC
  Administered 2020-10-07: 500 mL via ORAL

## 2020-10-07 MED ORDER — IOHEXOL 350 MG/ML SOLN
100.0000 mL | Freq: Once | INTRAVENOUS | Status: AC | PRN
  Administered 2020-10-07: 100 mL via INTRAVENOUS

## 2020-10-07 MED ORDER — TRAMADOL HCL 50 MG PO TABS: 50.0000 mg | ORAL_TABLET | Freq: Four times a day (QID) | ORAL | 0 refills | Status: DC | PRN

## 2020-10-07 MED ORDER — AMOXICILLIN-POT CLAVULANATE 875-125 MG PO TABS: 1.0000 | ORAL_TABLET | Freq: Two times a day (BID) | ORAL | 0 refills | Status: DC

## 2020-10-07 MED ORDER — AMOXICILLIN-POT CLAVULANATE 875-125 MG PO TABS
1.0000 | ORAL_TABLET | Freq: Once | ORAL | Status: AC
  Administered 2020-10-07: 1 via ORAL
  Filled 2020-10-07: qty 1

## 2020-10-07 MED ORDER — ONDANSETRON 4 MG PO TBDP
4.0000 mg | ORAL_TABLET | Freq: Three times a day (TID) | ORAL | 0 refills | Status: DC | PRN
Start: 2020-10-07 — End: 2021-10-10

## 2020-10-07 NOTE — ED Notes (Signed)
Patient transported to CT 

## 2020-10-07 NOTE — ED Notes (Signed)
Pt ambulatory to RR for UA specimen at this time.

## 2020-10-07 NOTE — Discharge Instructions (Addendum)
1.  Take antibiotic as prescribed (Augmentin 875 mg twice daily x7 days). 2.  You may take pain and nausea medicines as needed (tramadol/Zofran #20). 3.  Return to the ER for worsening symptoms, persistent vomiting, difficulty breathing or other concerns.

## 2020-10-10 DIAGNOSIS — R110 Nausea: Secondary | ICD-10-CM | POA: Diagnosis not present

## 2020-10-10 DIAGNOSIS — E782 Mixed hyperlipidemia: Secondary | ICD-10-CM | POA: Diagnosis not present

## 2020-10-10 DIAGNOSIS — K5792 Diverticulitis of intestine, part unspecified, without perforation or abscess without bleeding: Secondary | ICD-10-CM | POA: Diagnosis not present

## 2020-10-10 DIAGNOSIS — E119 Type 2 diabetes mellitus without complications: Secondary | ICD-10-CM | POA: Diagnosis not present

## 2020-10-10 DIAGNOSIS — Z125 Encounter for screening for malignant neoplasm of prostate: Secondary | ICD-10-CM | POA: Diagnosis not present

## 2020-10-10 DIAGNOSIS — I10 Essential (primary) hypertension: Secondary | ICD-10-CM | POA: Diagnosis not present

## 2020-10-12 ENCOUNTER — Other Ambulatory Visit: Payer: Self-pay

## 2020-10-12 NOTE — Patient Outreach (Signed)
[facility]) Care Management  [name]  10/12/2020   Todd Spencer [birth year] [id number]  Subjective: Telephone call for disease management.  Discussed diabetes management.  No concerns.  Objective:   Encounter Medications:  Outpatient Encounter Medications as of 10/12/2020  Medication Sig   acetaminophen (TYLENOL) 500 MG tablet Take 2 tablets (1,000 mg total) by mouth every 6 (six) hours.   amoxicillin-clavulanate (AUGMENTIN) 875-125 MG tablet Take 1 tablet by mouth 2 (two) times daily.   atorvastatin (LIPITOR) 40 MG tablet Take 40 mg by mouth daily.   carvedilol (COREG) 3.125 MG tablet Take 3.125 mg by mouth 2 (two) times daily with a meal.   diazepam (VALIUM) 5 MG tablet Take 5 mg by mouth every 12 (twelve) hours as needed for anxiety (vertigo).   donepezil (ARICEPT) 10 MG tablet Take 10 mg by mouth daily.   doxepin (SINEQUAN) 100 MG capsule Take 100-200 mg by mouth See admin instructions. Take 100 mg in the morning and 200 mg at night   famotidine (PEPCID) 40 MG tablet Take 40 mg by mouth at bedtime.   gemfibrozil (LOPID) 600 MG tablet Take 600 mg by mouth 2 (two) times daily before a meal.   isosorbide dinitrate (ISORDIL) 30 MG tablet Take 30 mg by mouth 3 (three) times daily.    lisinopril (ZESTRIL) 10 MG tablet Take 10 mg by mouth daily.   magnesium hydroxide (MILK OF MAGNESIA) 400 MG/5ML suspension Take 15 mLs by mouth at bedtime.   meclizine (ANTIVERT) 25 MG tablet Take 25 mg by mouth 2 (two) times daily.    Multiple Vitamin (MULTIVITAMIN WITH MINERALS) TABS tablet Take 1 tablet by mouth daily.   nitroGLYCERIN (NITROSTAT) 0.4 MG SL tablet Place 0.4 mg under the tongue every 5 (five) minutes as needed for chest pain.    ondansetron (ZOFRAN ODT) 4 MG disintegrating tablet Take 1 tablet (4 mg total) by mouth every 8 (eight) hours as needed for nausea or vomiting.   pregabalin (LYRICA) 300 MG capsule Take 300 mg by mouth 2 (two) times daily.   sodium  chloride (OCEAN) 0.65 % SOLN nasal spray Place 1 spray into both nostrils 2 (two) times daily as needed for congestion.   traMADol (ULTRAM) 50 MG tablet Take 1 tablet (50 mg total) by mouth every 6 (six) hours as needed.   No facility-administered encounter medications on file as of 10/12/2020.    Functional Status:  In your present state of health, do you have any difficulty performing the following activities: 09/16/2020 08/09/2020  Hearing? Tempie Donning  Vision? N N  Difficulty concentrating or making decisions? N Y  Comment - short term memory loss  Walking or climbing stairs? N N  Dressing or bathing? N N  Doing errands, shopping? N N  Preparing Food and eating ? - N  Using the Toilet? - N  In the past six months, have you accidently leaked urine? - N  Do you have problems with loss of bowel control? - N  Managing your Medications? - N  Managing your Finances? - N  Housekeeping or managing your Housekeeping? - N  Some recent data might be hidden    Fall/Depression Screening: Fall Risk  08/09/2020 01/06/2016 01/03/2016  Falls in the past year? 0 No No   PHQ 2/9 Scores 08/09/2020 01/03/2016  PHQ - 2 Score 0 0    Assessment:   Care Plan Care Plan : Diabetes Type 2 (Adult)  Updates made by Jon Billings, RN  since 10/12/2020 12:00 AM     Problem: Glycemic Management (Diabetes, Type 2)      Long-Range Goal: Glycemic Management Optimized as evidenced by A1c less than 7   Start Date: 08/09/2020  Expected End Date: 02/18/2021  This Visit's Progress: On track  Recent Progress: On track  Priority: High  Note:   Evidence-based guidance:  Anticipate use of antihyperglycemic with or without insulin and periodic adjustments; consider active involvement of pharmacist.  Provide medical nutrition therapy and development of individualized eating.  Compare self-reported symptoms of hypo or hyperglycemia to blood glucose levels, diet and fluid intake, current medications, psychosocial and  physiologic stressors, change in activity and barriers to care adherence.  Promote self-monitoring of blood glucose levels.  Notes:  09/01/20 Patient reports sugars doing good. No morning shots recently.  Sugar ranging 80-95 in the am.  Encouraged to continue diabetic diet.      Task: Alleviate Barriers to Glycemic Management   Due Date: 02/18/2021  Priority: Routine  Responsible User: Jon Billings, RN  Note:   Care Management Activities:    - blood glucose monitoring encouraged - blood glucose readings reviewed - self-awareness of signs/symptoms of hypo or hyperglycemia encouraged - use of blood glucose monitoring log promoted    Notes: 08/09/20 Patient reports that his sugars run between 85-95 in the morning. Patient takes medications as prescribed.   Diabetes Management Discussed: Medication adherence Reviewed importance of limiting carbs such as rice, potatoes, breads, and pastas. Also discussed limiting sweets and sugary drinks.  Discussed importance of portion control.  Also discussed importance of annual exams, foot exams, and eye exams.    09/01/20 Patient reports sugars doing good. No morning shots recently.  Sugar ranging 80-95 in the am.  Encouraged to continue diabetic diet.  Reiterated diabetes management.   10/12/20 Patient reports doing ok.  Recent inflammation in stomach.  He reports some weakness and followed up with his PCP this  week and no concerns.  Sugars continue to do good per patient with daily readings. Patient reports that his sugar this morning was 8. Reviewed diabetes management.         Goals Addressed             This Visit's Progress    Monitor and Manage My Blood Sugar-Diabetes Type 2   On track    Barriers: Health Behaviors Knowledge Timeframe:  Long-Range Goal Priority:  High Start Date:   08/09/20                          Expected End Date:   02/18/21                    Follow Up Date 11/18/20  - check blood sugar at prescribed times -  take the blood sugar log to all doctor visits    Why is this important?   Checking your blood sugar at home helps to keep it from getting very high or very low.  Writing the results in a diary or log helps the doctor know how to care for you.  Your blood sugar log should have the time, date and the results.  Also, write down the amount of insulin or other medicine that you take.  Other information, like what you ate, exercise done and how you were feeling, will also be helpful.     Notes: Patient managing diabetes currently A1c 5.7.   Diabetes Management Discussed: Medication adherence  Reviewed importance of limiting carbs such as rice, potatoes, breads, and pastas. Also discussed limiting sweets and sugary drinks.  Discussed importance of portion control.  Also discussed importance of annual exams, foot exams, and eye exams.   09/01/20 Patient reports sugars doing good. No morning shots recently.  Sugar ranging 80-95 in the am.  Encouraged to continue diabetic diet.  Reiterated diabetes management. 10/12/20 Patient continues with diabetes management.  Today's sugar 88.  Reviewed diabetes management.  Keep up the great work!!        Plan:  Follow-up: Patient agrees to Care Plan and Follow-up. Follow-up in 4-6 week(s)  [name], RN, MSN [place] [facility] Care Management Coordinator Direct Line [phone] Cell [phone] Toll Free: [phone]  Fax: [phone]

## 2020-10-12 NOTE — Patient Instructions (Signed)
Goals Addressed             This Visit's Progress    Monitor and Manage My Blood Sugar-Diabetes Type 2   On track    Barriers: Health Behaviors Knowledge Timeframe:  Long-Range Goal Priority:  High Start Date:   08/09/20                          Expected End Date:   02/18/21                    Follow Up Date 11/18/20  - check blood sugar at prescribed times - take the blood sugar log to all doctor visits    Why is this important?   Checking your blood sugar at home helps to keep it from getting very high or very low.  Writing the results in a diary or log helps the doctor know how to care for you.  Your blood sugar log should have the time, date and the results.  Also, write down the amount of insulin or other medicine that you take.  Other information, like what you ate, exercise done and how you were feeling, will also be helpful.     Notes: Patient managing diabetes currently A1c 5.7.   Diabetes Management Discussed: Medication adherence Reviewed importance of limiting carbs such as rice, potatoes, breads, and pastas. Also discussed limiting sweets and sugary drinks.  Discussed importance of portion control.  Also discussed importance of annual exams, foot exams, and eye exams.   09/01/20 Patient reports sugars doing good. No morning shots recently.  Sugar ranging 80-95 in the am.  Encouraged to continue diabetic diet.  Reiterated diabetes management. 10/12/20 Patient continues with diabetes management.  Today's sugar 88.  Reviewed diabetes management.  Keep up the great work!!

## 2020-10-13 ENCOUNTER — Ambulatory Visit: Payer: Self-pay

## 2020-11-10 ENCOUNTER — Other Ambulatory Visit: Payer: Self-pay

## 2020-11-10 NOTE — Patient Outreach (Addendum)
[facility]) Care Management  11/10/2020  Todd Spencer 04/06/1950 [id number]   Telephone call to patient for disease management follow up.   No answer.  Voice mail full, unable to leave a message.  Plan: If no return call, RN CM will attempt patient again in the month of October.    [name], RN, MSN [place] [facility] Care Management Coordinator Direct Line [phone] Cell [phone] Toll Free: [phone]  Fax: [phone]

## 2020-11-30 ENCOUNTER — Other Ambulatory Visit: Payer: Self-pay

## 2020-11-30 NOTE — Patient Outreach (Signed)
Todd Spencer) Care Management  Todd Spencer  11/30/2020   Todd Spencer December 05, 1950 [id number]  Subjective: Telephone call to patient for follow up. He is doing good.  Diabetes management continues.  No concerns.   Objective:   Encounter Medications:  Outpatient Encounter Medications as of 11/30/2020  Medication Sig   acetaminophen (TYLENOL) 500 MG tablet Take 2 tablets (1,000 mg total) by mouth every 6 (six) hours.   amoxicillin-clavulanate (AUGMENTIN) 875-125 MG tablet Take 1 tablet by mouth 2 (two) times daily.   atorvastatin (LIPITOR) 40 MG tablet Take 40 mg by mouth daily.   carvedilol (COREG) 3.125 MG tablet Take 3.125 mg by mouth 2 (two) times daily with a meal.   diazepam (VALIUM) 5 MG tablet Take 5 mg by mouth every 12 (twelve) hours as needed for anxiety (vertigo).   donepezil (ARICEPT) 10 MG tablet Take 10 mg by mouth daily.   doxepin (SINEQUAN) 100 MG capsule Take 100-200 mg by mouth See admin instructions. Take 100 mg in the morning and 200 mg at night   famotidine (PEPCID) 40 MG tablet Take 40 mg by mouth at bedtime.   gemfibrozil (LOPID) 600 MG tablet Take 600 mg by mouth 2 (two) times daily before a meal.   isosorbide dinitrate (ISORDIL) 30 MG tablet Take 30 mg by mouth 3 (three) times daily.    lisinopril (ZESTRIL) 10 MG tablet Take 10 mg by mouth daily.   magnesium hydroxide (MILK OF MAGNESIA) 400 MG/5ML suspension Take 15 mLs by mouth at bedtime.   meclizine (ANTIVERT) 25 MG tablet Take 25 mg by mouth 2 (two) times daily.    Multiple Vitamin (MULTIVITAMIN WITH MINERALS) TABS tablet Take 1 tablet by mouth daily.   nitroGLYCERIN (NITROSTAT) 0.4 MG SL tablet Place 0.4 mg under the tongue every 5 (five) minutes as needed for chest pain.    ondansetron (ZOFRAN ODT) 4 MG disintegrating tablet Take 1 tablet (4 mg total) by mouth every 8 (eight) hours as needed for nausea or vomiting.   pregabalin (LYRICA) 300 MG capsule Take 300 mg by mouth 2 (two)  times daily.   sodium chloride (OCEAN) 0.65 % SOLN nasal spray Place 1 spray into both nostrils 2 (two) times daily as needed for congestion.   traMADol (ULTRAM) 50 MG tablet Take 1 tablet (50 mg total) by mouth every 6 (six) hours as needed.   No facility-administered encounter medications on file as of 11/30/2020.    Functional Status:  In your present state of health, do you have any difficulty performing the following activities: 09/16/2020 08/09/2020  Hearing? Todd Spencer Donning  Vision? N N  Difficulty concentrating or making decisions? N Y  Comment - short term memory loss  Walking or climbing stairs? N N  Dressing or bathing? N N  Doing errands, shopping? N N  Preparing Food and eating ? - N  Using the Toilet? - N  In the past six months, have you accidently leaked urine? - N  Do you have problems with loss of bowel control? - N  Managing your Medications? - N  Managing your Finances? - N  Housekeeping or managing your Housekeeping? - N  Some recent data might be hidden    Fall/Depression Screening: Fall Risk  08/09/2020 01/06/2016 01/03/2016  Falls in the past year? 0 No No   PHQ 2/9 Scores 08/09/2020 01/03/2016  PHQ - 2 Score 0 0    Assessment:   Care Plan Care Plan : Diabetes Type 2 (Adult)  Updates made by Jon Billings, RN since 11/30/2020 12:00 AM     Problem: Glycemic Management (Diabetes, Type 2)      Long-Range Goal: Glycemic Management Optimized as evidenced by A1c less than 7   Start Date: 08/09/2020  Expected End Date: 08/18/2021  This Visit's Progress: On track  Recent Progress: On track  Priority: High  Note:   Evidence-based guidance:  Anticipate use of antihyperglycemic with or without insulin and periodic adjustments; consider active involvement of pharmacist.  Provide medical nutrition therapy and development of individualized eating.  Compare self-reported symptoms of hypo or hyperglycemia to blood glucose levels, diet and fluid intake, current medications,  psychosocial and physiologic stressors, change in activity and barriers to care adherence.  Promote self-monitoring of blood glucose levels.  Notes:  09/01/20 Patient reports sugars doing good. No morning shots recently.  Sugar ranging 80-95 in the am.  Encouraged to continue diabetic diet.      Task: Alleviate Barriers to Glycemic Management   Due Date: 02/18/2021  Priority: Routine  Responsible User: Jon Billings, RN  Note:   Care Management Activities:    - blood glucose monitoring encouraged - blood glucose readings reviewed - self-awareness of signs/symptoms of hypo or hyperglycemia encouraged - use of blood glucose monitoring log promoted    Notes: 08/09/20 Patient reports that his sugars run between 85-95 in the morning. Patient takes medications as prescribed.   Diabetes Management Discussed: Medication adherence Reviewed importance of limiting carbs such as rice, potatoes, breads, and pastas. Also discussed limiting sweets and sugary drinks.  Discussed importance of portion control.  Also discussed importance of annual exams, foot exams, and eye exams.    09/01/20 Patient reports sugars doing good. No morning shots recently.  Sugar ranging 80-95 in the am.  Encouraged to continue diabetic diet.  Reiterated diabetes management.   10/12/20 Patient reports doing ok.  Recent inflammation in stomach.  He reports some weakness and followed up with his PCP this  week and no concerns.  Sugars continue to do good per patient with daily readings. Patient reports that his sugar this morning was 8. Reviewed diabetes management.   11/30/20 Patient reports diabetes management going well. Discussed diabetes management.  Blood sugar this am was 100.Keep up the great work!!      Goals Addressed             This Visit's Progress    Monitor and Manage My Blood Sugar-Diabetes Type 2   On track    Barriers: Health Behaviors Knowledge Timeframe:  Long-Range Goal Priority:  High Start Date:    08/09/20                          Expected End Date:   08/18/21            Follow Up Date 03/21/20 - check blood sugar at prescribed times - take the blood sugar log to all doctor visits    Why is this important?   Checking your blood sugar at home helps to keep it from getting very high or very low.  Writing the results in a diary or log helps the doctor know how to care for you.  Your blood sugar log should have the time, date and the results.  Also, write down the amount of insulin or other medicine that you take.  Other information, like what you ate, exercise done and how you were feeling, will also be helpful.  Notes: Patient managing diabetes currently A1c 5.7.   Diabetes Management Discussed: Medication adherence Reviewed importance of limiting carbs such as rice, potatoes, breads, and pastas. Also discussed limiting sweets and sugary drinks.  Discussed importance of portion control.  Also discussed importance of annual exams, foot exams, and eye exams.   09/01/20 Patient reports sugars doing good. No morning shots recently.  Sugar ranging 80-95 in the am.  Encouraged to continue diabetic diet.  Reiterated diabetes management. 10/12/20 Patient continues with diabetes management.  Today's sugar 88.  Reviewed diabetes management.  Keep up the great work!! 11/30/20 Patient reports diabetes management going well. Discussed diabetes management.  Blood sugar this am was 100.Keep up the great work!!        Plan:  Follow-up: Patient agrees to Care Plan and Follow-up. Follow-up in 3 month(s)   [name], RN, MSN [place] [facility] Care Management Coordinator Direct Line [phone] Cell [phone] Toll Free: [phone]  Fax: [phone]

## 2020-11-30 NOTE — Patient Instructions (Signed)
Goals Addressed             This Visit's Progress    Monitor and Manage My Blood Sugar-Diabetes Type 2   On track    Barriers: Health Behaviors Knowledge Timeframe:  Long-Range Goal Priority:  High Start Date:   08/09/20                          Expected End Date:   08/18/21            Follow Up Date 03/21/20 - check blood sugar at prescribed times - take the blood sugar log to all doctor visits    Why is this important?   Checking your blood sugar at home helps to keep it from getting very high or very low.  Writing the results in a diary or log helps the doctor know how to care for you.  Your blood sugar log should have the time, date and the results.  Also, write down the amount of insulin or other medicine that you take.  Other information, like what you ate, exercise done and how you were feeling, will also be helpful.     Notes: Patient managing diabetes currently A1c 5.7.   Diabetes Management Discussed: Medication adherence Reviewed importance of limiting carbs such as rice, potatoes, breads, and pastas. Also discussed limiting sweets and sugary drinks.  Discussed importance of portion control.  Also discussed importance of annual exams, foot exams, and eye exams.   09/01/20 Patient reports sugars doing good. No morning shots recently.  Sugar ranging 80-95 in the am.  Encouraged to continue diabetic diet.  Reiterated diabetes management. 10/12/20 Patient continues with diabetes management.  Today's sugar 88.  Reviewed diabetes management.  Keep up the great work!! 11/30/20 Patient reports diabetes management going well. Discussed diabetes management.  Blood sugar this am was 100.Keep up the great work!!

## 2020-12-01 ENCOUNTER — Ambulatory Visit: Payer: Self-pay

## 2020-12-07 DIAGNOSIS — E113293 Type 2 diabetes mellitus with mild nonproliferative diabetic retinopathy without macular edema, bilateral: Secondary | ICD-10-CM | POA: Diagnosis not present

## 2020-12-07 DIAGNOSIS — Z794 Long term (current) use of insulin: Secondary | ICD-10-CM | POA: Diagnosis not present

## 2020-12-14 DIAGNOSIS — E1159 Type 2 diabetes mellitus with other circulatory complications: Secondary | ICD-10-CM | POA: Diagnosis not present

## 2020-12-14 DIAGNOSIS — E1142 Type 2 diabetes mellitus with diabetic polyneuropathy: Secondary | ICD-10-CM | POA: Diagnosis not present

## 2020-12-14 DIAGNOSIS — E1122 Type 2 diabetes mellitus with diabetic chronic kidney disease: Secondary | ICD-10-CM | POA: Diagnosis not present

## 2020-12-14 DIAGNOSIS — Z794 Long term (current) use of insulin: Secondary | ICD-10-CM | POA: Diagnosis not present

## 2020-12-14 DIAGNOSIS — N1831 Chronic kidney disease, stage 3a: Secondary | ICD-10-CM | POA: Diagnosis not present

## 2020-12-14 DIAGNOSIS — E113293 Type 2 diabetes mellitus with mild nonproliferative diabetic retinopathy without macular edema, bilateral: Secondary | ICD-10-CM | POA: Diagnosis not present

## 2020-12-19 DIAGNOSIS — H34831 Tributary (branch) retinal vein occlusion, right eye, with macular edema: Secondary | ICD-10-CM | POA: Diagnosis not present

## 2020-12-19 DIAGNOSIS — E113291 Type 2 diabetes mellitus with mild nonproliferative diabetic retinopathy without macular edema, right eye: Secondary | ICD-10-CM | POA: Diagnosis not present

## 2021-01-12 ENCOUNTER — Other Ambulatory Visit: Payer: Self-pay

## 2021-01-12 ENCOUNTER — Emergency Department
Admission: EM | Admit: 2021-01-12 | Discharge: 2021-01-12 | Disposition: A | Discharge status: Home / Self Care | Payer: Medicare HMO | Attending: Emergency Medicine | Admitting: Emergency Medicine

## 2021-01-12 ENCOUNTER — Emergency Department: Payer: Medicare HMO

## 2021-01-12 ENCOUNTER — Encounter: Payer: Self-pay | Admitting: Emergency Medicine

## 2021-01-12 DIAGNOSIS — I5022 Chronic systolic (congestive) heart failure: Secondary | ICD-10-CM | POA: Diagnosis not present

## 2021-01-12 DIAGNOSIS — I2510 Atherosclerotic heart disease of native coronary artery without angina pectoris: Secondary | ICD-10-CM | POA: Diagnosis not present

## 2021-01-12 DIAGNOSIS — I10 Essential (primary) hypertension: Secondary | ICD-10-CM | POA: Diagnosis not present

## 2021-01-12 DIAGNOSIS — I130 Hypertensive heart and chronic kidney disease with heart failure and stage 1 through stage 4 chronic kidney disease, or unspecified chronic kidney disease: Secondary | ICD-10-CM | POA: Diagnosis not present

## 2021-01-12 DIAGNOSIS — M25512 Pain in left shoulder: Secondary | ICD-10-CM | POA: Insufficient documentation

## 2021-01-12 DIAGNOSIS — N183 Chronic kidney disease, stage 3 unspecified: Secondary | ICD-10-CM | POA: Diagnosis not present

## 2021-01-12 DIAGNOSIS — G309 Alzheimer's disease, unspecified: Secondary | ICD-10-CM | POA: Insufficient documentation

## 2021-01-12 DIAGNOSIS — M79602 Pain in left arm: Secondary | ICD-10-CM | POA: Insufficient documentation

## 2021-01-12 DIAGNOSIS — R001 Bradycardia, unspecified: Secondary | ICD-10-CM | POA: Diagnosis not present

## 2021-01-12 DIAGNOSIS — Z955 Presence of coronary angioplasty implant and graft: Secondary | ICD-10-CM | POA: Insufficient documentation

## 2021-01-12 DIAGNOSIS — E1122 Type 2 diabetes mellitus with diabetic chronic kidney disease: Secondary | ICD-10-CM | POA: Insufficient documentation

## 2021-01-12 DIAGNOSIS — J90 Pleural effusion, not elsewhere classified: Secondary | ICD-10-CM | POA: Diagnosis not present

## 2021-01-12 DIAGNOSIS — R079 Chest pain, unspecified: Secondary | ICD-10-CM | POA: Diagnosis not present

## 2021-01-12 DIAGNOSIS — F0150 Vascular dementia without behavioral disturbance: Secondary | ICD-10-CM | POA: Insufficient documentation

## 2021-01-12 DIAGNOSIS — Z96653 Presence of artificial knee joint, bilateral: Secondary | ICD-10-CM | POA: Diagnosis not present

## 2021-01-12 DIAGNOSIS — R0789 Other chest pain: Secondary | ICD-10-CM | POA: Diagnosis not present

## 2021-01-12 DIAGNOSIS — R0602 Shortness of breath: Secondary | ICD-10-CM | POA: Diagnosis not present

## 2021-01-12 DIAGNOSIS — Z87891 Personal history of nicotine dependence: Secondary | ICD-10-CM | POA: Diagnosis not present

## 2021-01-12 DIAGNOSIS — R42 Dizziness and giddiness: Secondary | ICD-10-CM | POA: Diagnosis not present

## 2021-01-12 DIAGNOSIS — Z79899 Other long term (current) drug therapy: Secondary | ICD-10-CM | POA: Diagnosis not present

## 2021-01-12 DIAGNOSIS — R600 Localized edema: Secondary | ICD-10-CM | POA: Insufficient documentation

## 2021-01-12 LAB — CBC
HCT: 43.2 % (ref 39.0–52.0)
Hemoglobin: 13.9 g/dL (ref 13.0–17.0)
MCH: 28.8 pg (ref 26.0–34.0)
MCHC: 32.2 g/dL (ref 30.0–36.0)
MCV: 89.4 fL (ref 80.0–100.0)
Platelets: 128 10*3/uL — ABNORMAL LOW (ref 150–400)
RBC: 4.83 MIL/uL (ref 4.22–5.81)
RDW: 14.2 % (ref 11.5–15.5)
WBC: 6.1 10*3/uL (ref 4.0–10.5)
nRBC: 0 % (ref 0.0–0.2)

## 2021-01-12 LAB — TROPONIN I (HIGH SENSITIVITY)
Troponin I (High Sensitivity): 17 ng/L (ref <18)
Troponin I (High Sensitivity): 20 ng/L — ABNORMAL HIGH (ref <18)
Troponin I (High Sensitivity): 17 ng/L (ref <18)

## 2021-01-12 LAB — BASIC METABOLIC PANEL
Anion gap: 5 (ref 5–15)
BUN: 21 mg/dL (ref 8–23)
CO2: 29 mmol/L (ref 22–32)
Calcium: 9.4 mg/dL (ref 8.9–10.3)
Chloride: 104 mmol/L (ref 98–111)
Creatinine, Ser: 1.35 mg/dL — ABNORMAL HIGH (ref 0.61–1.24)
GFR, Estimated: 56 mL/min — ABNORMAL LOW (ref >60)
Glucose, Bld: 129 mg/dL — ABNORMAL HIGH (ref 70–99)
Potassium: 5.3 mmol/L — ABNORMAL HIGH (ref 3.5–5.1)
Sodium: 138 mmol/L (ref 135–145)

## 2021-01-12 LAB — CBG MONITORING, ED: Glucose-Capillary: 61 mg/dL — ABNORMAL LOW (ref 70–99)

## 2021-01-12 MED ORDER — ACETAMINOPHEN 500 MG PO TABS: 1000.0000 mg | ORAL_TABLET | Freq: Once | ORAL | Status: DC

## 2021-01-12 MED ORDER — OXYCODONE HCL 5 MG PO TABS
5.0000 mg | ORAL_TABLET | Freq: Once | ORAL | Status: AC
  Administered 2021-01-12: 5 mg via ORAL
  Filled 2021-01-12: qty 1

## 2021-01-12 NOTE — Discharge Instructions (Addendum)
Your blood work and EKG were reassuring that this pain is unlikely to be related to your heart.  If you continue to have symptoms, please follow-up with your primary care provider.
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
## 2021-01-12 NOTE — ED Notes (Signed)
Pt stated he felt like his blood sugar was dropping. Checked via POC test and found to be 61. Pt given apple juice, crackers, and peanut butter at this time.

## 2021-01-12 NOTE — ED Triage Notes (Signed)
Pt comes into the ED via [facility] from home c/o central chest pain with left arm numbness.  Pt states the pain radiates in between his shoulder blades.  The pain started at 3:00 am this morning.  Pt showing NSR on EKG.  A&Ox4.  2 known cardiac stents. Pt took 4 asa prior to EMS arrival.   164/84 95% RA 57 HR 153 CBG

## 2021-01-26 DIAGNOSIS — E1121 Type 2 diabetes mellitus with diabetic nephropathy: Secondary | ICD-10-CM | POA: Diagnosis not present

## 2021-01-26 DIAGNOSIS — I10 Essential (primary) hypertension: Secondary | ICD-10-CM | POA: Diagnosis not present

## 2021-01-26 DIAGNOSIS — E875 Hyperkalemia: Secondary | ICD-10-CM | POA: Diagnosis not present

## 2021-01-26 DIAGNOSIS — N1831 Chronic kidney disease, stage 3a: Secondary | ICD-10-CM | POA: Diagnosis not present

## 2021-02-14 DIAGNOSIS — H34831 Tributary (branch) retinal vein occlusion, right eye, with macular edema: Secondary | ICD-10-CM | POA: Diagnosis not present

## 2021-02-23 ENCOUNTER — Telehealth: Payer: Self-pay

## 2021-02-23 NOTE — Patient Outreach (Signed)
Attempted to call Mr. Pratte to let him know his appointment with Jon Billings, RN on January 11 will need to be rescheduled in February.  There was no answer and voicemail was full.  I will try again closer to the date.    [facility]ville Care, [place], [place] Management [phone]

## 2021-03-01 ENCOUNTER — Ambulatory Visit: Payer: Self-pay

## 2021-03-01 ENCOUNTER — Other Ambulatory Visit: Payer: Self-pay

## 2021-03-01 NOTE — Patient Outreach (Signed)
[place] [facility]) Care Management  03/01/2021  Todd Spencer 28-Sep-1950 [id number]   Patient appointment cancelled today and will be rescheduled in February. Patient stated he can do any day but 04/12/20 as he has another appointment.  Ina Homes [facility] Management Assistant [phone]

## 2021-03-08 DIAGNOSIS — I255 Ischemic cardiomyopathy: Secondary | ICD-10-CM | POA: Diagnosis not present

## 2021-03-08 DIAGNOSIS — E782 Mixed hyperlipidemia: Secondary | ICD-10-CM | POA: Diagnosis not present

## 2021-03-08 DIAGNOSIS — G4733 Obstructive sleep apnea (adult) (pediatric): Secondary | ICD-10-CM | POA: Diagnosis not present

## 2021-03-08 DIAGNOSIS — I10 Essential (primary) hypertension: Secondary | ICD-10-CM | POA: Diagnosis not present

## 2021-03-08 DIAGNOSIS — Z23 Encounter for immunization: Secondary | ICD-10-CM | POA: Diagnosis not present

## 2021-03-08 DIAGNOSIS — R001 Bradycardia, unspecified: Secondary | ICD-10-CM | POA: Diagnosis not present

## 2021-03-08 DIAGNOSIS — I2510 Atherosclerotic heart disease of native coronary artery without angina pectoris: Secondary | ICD-10-CM | POA: Diagnosis not present

## 2021-03-08 DIAGNOSIS — I5022 Chronic systolic (congestive) heart failure: Secondary | ICD-10-CM | POA: Diagnosis not present

## 2021-04-11 DIAGNOSIS — Z794 Long term (current) use of insulin: Secondary | ICD-10-CM | POA: Diagnosis not present

## 2021-04-11 DIAGNOSIS — E113293 Type 2 diabetes mellitus with mild nonproliferative diabetic retinopathy without macular edema, bilateral: Secondary | ICD-10-CM | POA: Diagnosis not present

## 2021-04-17 ENCOUNTER — Other Ambulatory Visit: Payer: Self-pay

## 2021-04-17 NOTE — Patient Outreach (Signed)
[place] [facility]) Care Management  04/17/2021  Todd Spencer [birth year] [id number]   Telephone call to patient for follow up disease management.  No answer.   Plan: RN CM will attempt again in May.  [name], RN, MSN [facility] Care Management Care Management Coordinator Direct Line [phone] Toll Free: [phone]  Fax: [phone]

## 2021-04-18 ENCOUNTER — Ambulatory Visit: Payer: Self-pay

## 2021-04-25 DIAGNOSIS — H34831 Tributary (branch) retinal vein occlusion, right eye, with macular edema: Secondary | ICD-10-CM | POA: Diagnosis not present

## 2021-04-26 DIAGNOSIS — E1122 Type 2 diabetes mellitus with diabetic chronic kidney disease: Secondary | ICD-10-CM | POA: Diagnosis not present

## 2021-04-26 DIAGNOSIS — E113293 Type 2 diabetes mellitus with mild nonproliferative diabetic retinopathy without macular edema, bilateral: Secondary | ICD-10-CM | POA: Diagnosis not present

## 2021-04-26 DIAGNOSIS — Z794 Long term (current) use of insulin: Secondary | ICD-10-CM | POA: Diagnosis not present

## 2021-04-26 DIAGNOSIS — E1159 Type 2 diabetes mellitus with other circulatory complications: Secondary | ICD-10-CM | POA: Diagnosis not present

## 2021-04-26 DIAGNOSIS — E1142 Type 2 diabetes mellitus with diabetic polyneuropathy: Secondary | ICD-10-CM | POA: Diagnosis not present

## 2021-04-26 DIAGNOSIS — N1831 Chronic kidney disease, stage 3a: Secondary | ICD-10-CM | POA: Diagnosis not present

## 2021-05-03 DIAGNOSIS — Z125 Encounter for screening for malignant neoplasm of prostate: Secondary | ICD-10-CM | POA: Diagnosis not present

## 2021-05-03 DIAGNOSIS — E782 Mixed hyperlipidemia: Secondary | ICD-10-CM | POA: Diagnosis not present

## 2021-05-03 DIAGNOSIS — I10 Essential (primary) hypertension: Secondary | ICD-10-CM | POA: Diagnosis not present

## 2021-05-09 DIAGNOSIS — D2262 Melanocytic nevi of left upper limb, including shoulder: Secondary | ICD-10-CM | POA: Diagnosis not present

## 2021-05-09 DIAGNOSIS — L905 Scar conditions and fibrosis of skin: Secondary | ICD-10-CM | POA: Diagnosis not present

## 2021-05-09 DIAGNOSIS — D485 Neoplasm of uncertain behavior of skin: Secondary | ICD-10-CM | POA: Diagnosis not present

## 2021-05-09 DIAGNOSIS — D2261 Melanocytic nevi of right upper limb, including shoulder: Secondary | ICD-10-CM | POA: Diagnosis not present

## 2021-05-09 DIAGNOSIS — L218 Other seborrheic dermatitis: Secondary | ICD-10-CM | POA: Diagnosis not present

## 2021-05-09 DIAGNOSIS — D225 Melanocytic nevi of trunk: Secondary | ICD-10-CM | POA: Diagnosis not present

## 2021-05-09 DIAGNOSIS — L98499 Non-pressure chronic ulcer of skin of other sites with unspecified severity: Secondary | ICD-10-CM | POA: Diagnosis not present

## 2021-05-09 DIAGNOSIS — L570 Actinic keratosis: Secondary | ICD-10-CM | POA: Diagnosis not present

## 2021-05-10 DIAGNOSIS — E1122 Type 2 diabetes mellitus with diabetic chronic kidney disease: Secondary | ICD-10-CM | POA: Diagnosis not present

## 2021-05-10 DIAGNOSIS — I2510 Atherosclerotic heart disease of native coronary artery without angina pectoris: Secondary | ICD-10-CM | POA: Diagnosis not present

## 2021-05-10 DIAGNOSIS — G309 Alzheimer's disease, unspecified: Secondary | ICD-10-CM | POA: Diagnosis not present

## 2021-05-10 DIAGNOSIS — N1831 Chronic kidney disease, stage 3a: Secondary | ICD-10-CM | POA: Diagnosis not present

## 2021-05-10 DIAGNOSIS — Z1331 Encounter for screening for depression: Secondary | ICD-10-CM | POA: Diagnosis not present

## 2021-05-10 DIAGNOSIS — Z0000 Encounter for general adult medical examination without abnormal findings: Secondary | ICD-10-CM | POA: Diagnosis not present

## 2021-05-10 DIAGNOSIS — F0150 Vascular dementia without behavioral disturbance: Secondary | ICD-10-CM | POA: Diagnosis not present

## 2021-05-10 DIAGNOSIS — Z1211 Encounter for screening for malignant neoplasm of colon: Secondary | ICD-10-CM | POA: Diagnosis not present

## 2021-05-10 DIAGNOSIS — I129 Hypertensive chronic kidney disease with stage 1 through stage 4 chronic kidney disease, or unspecified chronic kidney disease: Secondary | ICD-10-CM | POA: Diagnosis not present

## 2021-06-05 ENCOUNTER — Emergency Department: Payer: Medicare HMO

## 2021-06-05 ENCOUNTER — Encounter: Payer: Self-pay | Admitting: Radiology

## 2021-06-05 ENCOUNTER — Inpatient Hospital Stay: Payer: Medicare HMO

## 2021-06-05 ENCOUNTER — Other Ambulatory Visit: Payer: Self-pay

## 2021-06-05 ENCOUNTER — Observation Stay
Admission: EM | Admit: 2021-06-05 | Discharge: 2021-06-06 | Disposition: A | Discharge status: Home / Self Care | Payer: Medicare HMO | Attending: Internal Medicine | Admitting: Internal Medicine

## 2021-06-05 DIAGNOSIS — Z87891 Personal history of nicotine dependence: Secondary | ICD-10-CM | POA: Insufficient documentation

## 2021-06-05 DIAGNOSIS — Z96653 Presence of artificial knee joint, bilateral: Secondary | ICD-10-CM | POA: Diagnosis not present

## 2021-06-05 DIAGNOSIS — I1310 Hypertensive heart and chronic kidney disease without heart failure, with stage 1 through stage 4 chronic kidney disease, or unspecified chronic kidney disease: Secondary | ICD-10-CM | POA: Insufficient documentation

## 2021-06-05 DIAGNOSIS — N183 Chronic kidney disease, stage 3 unspecified: Secondary | ICD-10-CM | POA: Insufficient documentation

## 2021-06-05 DIAGNOSIS — Z4682 Encounter for fitting and adjustment of non-vascular catheter: Secondary | ICD-10-CM | POA: Diagnosis not present

## 2021-06-05 DIAGNOSIS — R52 Pain, unspecified: Secondary | ICD-10-CM | POA: Diagnosis not present

## 2021-06-05 DIAGNOSIS — R109 Unspecified abdominal pain: Secondary | ICD-10-CM | POA: Diagnosis present

## 2021-06-05 DIAGNOSIS — F0150 Vascular dementia without behavioral disturbance: Secondary | ICD-10-CM | POA: Insufficient documentation

## 2021-06-05 DIAGNOSIS — I5022 Chronic systolic (congestive) heart failure: Secondary | ICD-10-CM | POA: Diagnosis not present

## 2021-06-05 DIAGNOSIS — K56609 Unspecified intestinal obstruction, unspecified as to partial versus complete obstruction: Secondary | ICD-10-CM | POA: Diagnosis not present

## 2021-06-05 DIAGNOSIS — K5281 Eosinophilic gastritis or gastroenteritis: Secondary | ICD-10-CM | POA: Diagnosis not present

## 2021-06-05 DIAGNOSIS — E11649 Type 2 diabetes mellitus with hypoglycemia without coma: Secondary | ICD-10-CM | POA: Insufficient documentation

## 2021-06-05 DIAGNOSIS — Z79899 Other long term (current) drug therapy: Secondary | ICD-10-CM | POA: Diagnosis not present

## 2021-06-05 DIAGNOSIS — K56699 Other intestinal obstruction unspecified as to partial versus complete obstruction: Secondary | ICD-10-CM | POA: Diagnosis not present

## 2021-06-05 DIAGNOSIS — R609 Edema, unspecified: Secondary | ICD-10-CM | POA: Diagnosis not present

## 2021-06-05 DIAGNOSIS — I2510 Atherosclerotic heart disease of native coronary artery without angina pectoris: Secondary | ICD-10-CM | POA: Diagnosis not present

## 2021-06-05 DIAGNOSIS — G308 Other Alzheimer's disease: Secondary | ICD-10-CM | POA: Insufficient documentation

## 2021-06-05 DIAGNOSIS — N281 Cyst of kidney, acquired: Secondary | ICD-10-CM | POA: Diagnosis not present

## 2021-06-05 DIAGNOSIS — F0280 Dementia in other diseases classified elsewhere without behavioral disturbance: Secondary | ICD-10-CM | POA: Diagnosis present

## 2021-06-05 DIAGNOSIS — E162 Hypoglycemia, unspecified: Secondary | ICD-10-CM

## 2021-06-05 DIAGNOSIS — E119 Type 2 diabetes mellitus without complications: Secondary | ICD-10-CM

## 2021-06-05 DIAGNOSIS — R1084 Generalized abdominal pain: Secondary | ICD-10-CM | POA: Diagnosis not present

## 2021-06-05 DIAGNOSIS — N179 Acute kidney failure, unspecified: Secondary | ICD-10-CM | POA: Diagnosis not present

## 2021-06-05 DIAGNOSIS — I700 Atherosclerosis of aorta: Secondary | ICD-10-CM | POA: Diagnosis not present

## 2021-06-05 DIAGNOSIS — N400 Enlarged prostate without lower urinary tract symptoms: Secondary | ICD-10-CM | POA: Diagnosis not present

## 2021-06-05 DIAGNOSIS — K5730 Diverticulosis of large intestine without perforation or abscess without bleeding: Secondary | ICD-10-CM | POA: Diagnosis not present

## 2021-06-05 DIAGNOSIS — I10 Essential (primary) hypertension: Secondary | ICD-10-CM | POA: Diagnosis present

## 2021-06-05 DIAGNOSIS — R112 Nausea with vomiting, unspecified: Secondary | ICD-10-CM | POA: Diagnosis not present

## 2021-06-05 DIAGNOSIS — Z951 Presence of aortocoronary bypass graft: Secondary | ICD-10-CM | POA: Diagnosis not present

## 2021-06-05 LAB — GLUCOSE, CAPILLARY
Glucose-Capillary: 132 mg/dL — ABNORMAL HIGH (ref 70–99)
Glucose-Capillary: 123 mg/dL — ABNORMAL HIGH (ref 70–99)

## 2021-06-05 LAB — COMPREHENSIVE METABOLIC PANEL
ALT: 23 U/L (ref 0–44)
AST: 39 U/L (ref 15–41)
Albumin: 3.1 g/dL — ABNORMAL LOW (ref 3.5–5.0)
Alkaline Phosphatase: 54 U/L (ref 38–126)
Anion gap: 8 (ref 5–15)
BUN: 22 mg/dL (ref 8–23)
CO2: 22 mmol/L (ref 22–32)
Calcium: 7.3 mg/dL — ABNORMAL LOW (ref 8.9–10.3)
Chloride: 111 mmol/L (ref 98–111)
Creatinine, Ser: 1.25 mg/dL — ABNORMAL HIGH (ref 0.61–1.24)
GFR, Estimated: >60 mL/min (ref >60)
Glucose, Bld: 58 mg/dL — ABNORMAL LOW (ref 70–99)
Potassium: 3.5 mmol/L (ref 3.5–5.1)
Sodium: 141 mmol/L (ref 135–145)
Total Bilirubin: 0.5 mg/dL (ref 0.3–1.2)
Total Protein: 5.4 g/dL — ABNORMAL LOW (ref 6.5–8.1)

## 2021-06-05 LAB — CBC WITH DIFFERENTIAL/PLATELET
Abs Immature Granulocytes: 0.03 10*3/uL (ref 0.00–0.07)
Basophils Absolute: 0 10*3/uL (ref 0.0–0.1)
Basophils Relative: 0 %
Eosinophils Absolute: 0 10*3/uL (ref 0.0–0.5)
Eosinophils Relative: 1 %
HCT: 46.3 % (ref 39.0–52.0)
Hemoglobin: 14.8 g/dL (ref 13.0–17.0)
Immature Granulocytes: 1 %
Lymphocytes Relative: 17 %
Lymphs Abs: 0.9 10*3/uL (ref 0.7–4.0)
MCH: 28.8 pg (ref 26.0–34.0)
MCHC: 32 g/dL (ref 30.0–36.0)
MCV: 90.1 fL (ref 80.0–100.0)
Monocytes Absolute: 0.3 10*3/uL (ref 0.1–1.0)
Monocytes Relative: 6 %
Neutro Abs: 3.9 10*3/uL (ref 1.7–7.7)
Neutrophils Relative %: 75 %
Platelets: 139 10*3/uL — ABNORMAL LOW (ref 150–400)
RBC: 5.14 MIL/uL (ref 4.22–5.81)
RDW: 14.5 % (ref 11.5–15.5)
WBC: 5.2 10*3/uL (ref 4.0–10.5)
nRBC: 0 % (ref 0.0–0.2)

## 2021-06-05 LAB — LIPASE, BLOOD: Lipase: 41 U/L (ref 11–51)

## 2021-06-05 LAB — CBG MONITORING, ED: Glucose-Capillary: 191 mg/dL — ABNORMAL HIGH (ref 70–99)

## 2021-06-05 LAB — HEMOGLOBIN A1C
Hgb A1c MFr Bld: 7.1 % — ABNORMAL HIGH (ref 4.8–5.6)
Mean Plasma Glucose: 157.07 mg/dL

## 2021-06-05 MED ORDER — ACETAMINOPHEN 650 MG RE SUPP: 650.0000 mg | Freq: Four times a day (QID) | RECTAL | Status: DC | PRN

## 2021-06-05 MED ORDER — MORPHINE SULFATE (PF) 2 MG/ML IV SOLN: 2.0000 mg | INTRAVENOUS | Status: DC | PRN

## 2021-06-05 MED ORDER — ENOXAPARIN SODIUM 60 MG/0.6ML IJ SOSY
0.5000 mg/kg | PREFILLED_SYRINGE | INTRAMUSCULAR | Status: DC
  Administered 2021-06-05 – 2021-06-06 (×2): 52.5 mg via SUBCUTANEOUS
  Filled 2021-06-05 (×2): qty 0.6

## 2021-06-05 MED ORDER — ONDANSETRON HCL 4 MG PO TABS: 4.0000 mg | ORAL_TABLET | Freq: Four times a day (QID) | ORAL | Status: DC | PRN

## 2021-06-05 MED ORDER — ACETAMINOPHEN 325 MG PO TABS
650.0000 mg | ORAL_TABLET | Freq: Four times a day (QID) | ORAL | Status: DC | PRN
  Administered 2021-06-05: 650 mg via ORAL
  Filled 2021-06-05: qty 2

## 2021-06-05 MED ORDER — SODIUM CHLORIDE 0.9 % IV BOLUS
1000.0000 mL | Freq: Once | INTRAVENOUS | Status: AC
  Administered 2021-06-05: 1000 mL via INTRAVENOUS

## 2021-06-05 MED ORDER — ONDANSETRON HCL 4 MG/2ML IJ SOLN
4.0000 mg | Freq: Once | INTRAMUSCULAR | Status: AC
  Administered 2021-06-05: 4 mg via INTRAVENOUS
  Filled 2021-06-05: qty 2

## 2021-06-05 MED ORDER — IOHEXOL 300 MG/ML  SOLN
100.0000 mL | Freq: Once | INTRAMUSCULAR | Status: AC | PRN
  Administered 2021-06-05: 100 mL via INTRAVENOUS

## 2021-06-05 MED ORDER — ONDANSETRON HCL 4 MG/2ML IJ SOLN: 4.0000 mg | Freq: Four times a day (QID) | INTRAMUSCULAR | Status: DC | PRN

## 2021-06-05 MED ORDER — ENOXAPARIN SODIUM 40 MG/0.4ML IJ SOSY: 40.0000 mg | PREFILLED_SYRINGE | INTRAMUSCULAR | Status: DC

## 2021-06-05 MED ORDER — INSULIN ASPART 100 UNIT/ML IJ SOLN
0.0000 [IU] | INTRAMUSCULAR | Status: DC
  Administered 2021-06-05: 1 [IU] via SUBCUTANEOUS
  Administered 2021-06-06: 2 [IU] via SUBCUTANEOUS
  Filled 2021-06-05 (×2): qty 1

## 2021-06-05 MED ORDER — DEXTROSE 10 % IV SOLN: INTRAVENOUS | Status: DC

## 2021-06-05 NOTE — ED Provider Notes (Signed)
? ?[facility] ?Provider Note ? ? ? Event Date/Time  ? First MD Initiated Contact with Patient 06/05/21 0153   ?  (approximate) ? ? ?History  ? ?Abdominal Pain ? ?HPI ? ?Todd Spencer is a 71 y.o. male with a history of hypertension diabetes morbid obesity CHF recurrent SBO and Alzheimer's dementia who comes ED complaining of severe abdominal pain with nausea and vomiting that started this evening at 10:00 PM.  Rapid onset.  He has had normal bowel movements up till recently.  No fever or chills.  No hernia or injuries.  No hematemesis ?  ? ? ?Physical Exam  ? ?Triage Vital Signs: ?ED Triage Vitals  ?Enc Vitals Group  ?   BP 06/05/21 0150 104/65  ?   Pulse Rate 06/05/21 0150 88  ?   Resp 06/05/21 0150 (!) 22  ?   Temp 06/05/21 0150 97.7 ?F (36.5 ?C)  ?   Temp Source 06/05/21 0150 Oral  ?   SpO2 06/05/21 0150 93 %  ?   Weight 06/05/21 0152 235 lb (106.6 kg)  ?   Height 06/05/21 0152 '5\' 11"'$  (1.803 m)  ?   Head Circumference --   ?   Peak Flow --   ?   Pain Score 06/05/21 0152 9  ?   Pain Loc --   ?   Pain Edu? --   ?   Excl. in [place]? --   ? ? ?Most recent vital signs: ?Vitals:  ? 06/05/21 0327 06/05/21 0403  ?BP: 124/65 (!) 113/56  ?Pulse: 78 80  ?Resp: 18 18  ?Temp:    ?SpO2: 98% 95%  ? ? ? ?General: Awake, ill-appearing ?CV:  Good peripheral perfusion.  Regular rate and rhythm ?Resp:  Normal effort.  Clear to auscultation bilaterally ?Abd:  Abdomen is markedly distended.  Not peritoneal.  Abdominal wall is tympanitic. ?Other:  Skin appears pale.  He is somewhat diaphoretic.  Conjunctival capillary bed is not pale ? ? ?ED Results / Procedures / Treatments  ? ?Labs ?(all labs ordered are listed, but only abnormal results are displayed) ?Labs Reviewed  ?CBC WITH DIFFERENTIAL/PLATELET - Abnormal; Notable for the following components:  ?    Result Value  ? Platelets 139 (*)   ? All other components within normal limits  ?COMPREHENSIVE METABOLIC PANEL - Abnormal; Notable for the following components:   ? Glucose, Bld 58 (*)   ? Creatinine, Ser 1.25 (*)   ? Calcium 7.3 (*)   ? Total Protein 5.4 (*)   ? Albumin 3.1 (*)   ? All other components within normal limits  ?LIPASE, BLOOD  ? ? ? ?EKG ? ?Interpreted by me ?Sinus rhythm rate of 75.  Normal axis, left bundle branch block.  Normal QRS ST segments and T waves. ? ? ?RADIOLOGY ?CT abdomen pelvis viewed and interpreted by me, shows diffuse distention with air-fluid levels, concerning for SBO.  No free air.  Discussed with radiologist who confirms SBO. ? ? ? ?PROCEDURES: ? ?Critical Care performed: No ? ?Procedures ? ? ?MEDICATIONS ORDERED IN ED: ?Medications  ?dextrose 10 % infusion (has no administration in time range)  ?sodium chloride 0.9 % bolus 1,000 mL (1,000 mLs Intravenous New Bag/Given 06/05/21 0259)  ?ondansetron [facility]) injection 4 mg (4 mg Intravenous Given 06/05/21 0259)  ?iohexol (OMNIPAQUE) 300 MG/ML solution 100 mL (100 mLs Intravenous Contrast Given 06/05/21 0240)  ? ? ? ?IMPRESSION / MDM / ASSESSMENT AND PLAN / ED COURSE  ?I  reviewed the triage vital signs and the nursing notes. ?             ?               ? ?Differential diagnosis includes, but is not limited to, small bowel obstruction, perforated ulcer, intra-abdominal abscess, diverticulitis, constipation ? ? ?Patient presents with generalized abdominal pain with distention, nausea vomiting.  Clinically there is no identifiable hernia.  No peritonitis, but abdomen is significantly distended and painful, necessitating CT. ? ?Clinical Course as of 06/05/21 0423  ?Mon Jun 05, 2021  ?3568 CT scan discussed with radiologist, confirming SBO. [PS]  ?  ?Clinical Course User Index ?[PS] Carrie Mew, MD  ? ? ? ?FINAL CLINICAL IMPRESSION(S) / ED DIAGNOSES  ? ?Final diagnoses:  ?SBO (small bowel obstruction) ([place])  ? ? ? ?Rx / DC Orders  ? ?ED Discharge Orders   ? ? None  ? ?  ? ? ? ?Note:  This document was prepared using Dragon voice recognition software and may include unintentional dictation  errors. ?  ?Carrie Mew, MD ?06/05/21 0423 ? ?
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
## 2021-06-05 NOTE — ED Notes (Signed)
Called pt's friend, Margaretha Sheffield, per pt's request to give an update. No answer at this time. Message left ?

## 2021-06-05 NOTE — Plan of Care (Addendum)
Per verbal orders (Dr. Lysle Pearl) NG clamped [phone]  - to be placed back on suction after 2-3 hours for trial.  If output less than 250cc - NG may be pulled and begin progressing diet. ? ?Drs informed - NG clamp test completed and only produced 100cc one hooked back to suction.  NG removed and Drs informed starting CL diet. ? ? ?

## 2021-06-05 NOTE — Assessment & Plan Note (Signed)
Prerenal. Monitor for worsening given contrast study ?Hydrate. Avoid nephrotoxins ?

## 2021-06-05 NOTE — ED Triage Notes (Signed)
Patient c/o severe abdominal pain with N/V. He states his abdomen has grown to 3x its normal size. Patient is diaphoretic and pale. ?

## 2021-06-05 NOTE — Assessment & Plan Note (Addendum)
Patient hypoglycemic on admission with blood sugar 58 being corrected with D50  ?sliding scale insulin coverage ?

## 2021-06-05 NOTE — Assessment & Plan Note (Signed)
Not acutely exacerbated ?Hold home meds for now and resume carvedilol, Isordil, lisinopril when tolerating ?

## 2021-06-05 NOTE — Assessment & Plan Note (Signed)
Hold Aricept while n.p.o. ?

## 2021-06-05 NOTE — Progress Notes (Signed)
PT Cancellation Note ? ?Patient Details ?Name: Todd Spencer ?MRN: [id number] ?DOB: 02-11-1951 ? ? ?Cancelled Treatment:    Reason Eval/Treat Not Completed: PT screened, no needs identified, will sign off (Consult received and chart reviewed. Patient currently indep in room and in hallway; no safety needs or concerns, no acute PT needs identified.  Will complete PT orders at this time; please re-consult should needs change.) ? ?Syerra Abdelrahman H. Owens Shark, PT, DPT, NCS ?06/05/21, 2:33 PM ?[phone] ? ?

## 2021-06-05 NOTE — ED Notes (Signed)
Pt placed on 2L of O2 via . Sats dropping to mid 80s on RA ?

## 2021-06-05 NOTE — Progress Notes (Signed)
PHARMACIST - PHYSICIAN COMMUNICATION ? ?CONCERNING:  Enoxaparin (Lovenox) for DVT Prophylaxis  ? ? ?RECOMMENDATION: ?Patient was prescribed enoxaprin '40mg'$  q24 hours for VTE prophylaxis.  ? [name] Weights  ? 06/05/21 0152  ?Weight: 106.6 kg (235 lb)  ? ? ?Body mass index is 32.78 kg/m?. ? ?Estimated Creatinine Clearance: 66.3 mL/min (A) (by C-G formula based on SCr of 1.25 mg/dL (H)). ? ? ?Based on [place] patient is candidate for enoxaparin 0.'5mg'$ /kg TBW SQ every 24 hours based on BMI being >30. ? ?DESCRIPTION: ?Pharmacy has adjusted enoxaparin dose per [facility] policy. ? ?Patient is now receiving enoxaparin 52.5 mg every 24 hours  ? ? ?[name] PharmD, BCPS ?06/05/2021 5:03 AM ? ?
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
## 2021-06-05 NOTE — Assessment & Plan Note (Signed)
No complaints of chest pain and EKG nonacute ?Holding home meds of carvedilol, atorvastatin, Isordil and lisinopril and nitroglycerin for now ?

## 2021-06-05 NOTE — Assessment & Plan Note (Addendum)
Secondary to decreased oral intake from vomiting ?Not symptomatic  ?D10 ordered from the ED ? ?

## 2021-06-05 NOTE — Assessment & Plan Note (Signed)
As needed IV hydralazine while n.p.o. 

## 2021-06-05 NOTE — Progress Notes (Signed)
OT Cancellation Note ? ?Patient Details ?Name: MOUSSA WIEGAND ?MRN: [id number] ?DOB: [birth year] ? ? ?Cancelled Treatment:    Reason Eval/Treat Not Completed: OT screened, no needs identified, will sign off. Order received and chart reviewed. Per conversation with PT, pt at baseline functional independence, no acute OT needs, will sign off.  ? ?Dessie Coma, M.S. OTR/L  ?06/05/21, 2:40 PM  ?ascom [phone] ? ?

## 2021-06-05 NOTE — Assessment & Plan Note (Addendum)
We will keep n.p.o. ?Pending NG tube ordered from the ED ?Patient had a large BM while in the ED so anticipating resolution with conservative management ?IV hydration, IV antiemetics and supportive care ?Surgical consult to follow ?

## 2021-06-05 NOTE — ED Notes (Signed)
Pt had a large loose BM ?

## 2021-06-05 NOTE — ED Notes (Signed)
Patient states that he needs to emergently use the bedside commode but he was placed on a bedpan due to his low blood pressure and his skin color. ?

## 2021-06-05 NOTE — Final Progress Note (Signed)
Same day rounding progress note ? ?Patient seen and examined in the ED.  Please see Dr. Josefine Class dictated history and physical for further details.  I agree with assessment and plan ? ?Patient was seen by surgery.  Patient already had multiple bowel movements since admission.  NG tube was removed.  Diet is started and advance as tolerated.  Hopefully home in next 1 to 2 days depending on clinical progress ? ?Time spent: 20 minutes ?

## 2021-06-06 DIAGNOSIS — K56609 Unspecified intestinal obstruction, unspecified as to partial versus complete obstruction: Secondary | ICD-10-CM | POA: Diagnosis not present

## 2021-06-06 DIAGNOSIS — I5022 Chronic systolic (congestive) heart failure: Secondary | ICD-10-CM | POA: Diagnosis not present

## 2021-06-06 DIAGNOSIS — N179 Acute kidney failure, unspecified: Secondary | ICD-10-CM | POA: Diagnosis not present

## 2021-06-06 DIAGNOSIS — K5281 Eosinophilic gastritis or gastroenteritis: Secondary | ICD-10-CM | POA: Diagnosis not present

## 2021-06-06 LAB — GLUCOSE, CAPILLARY
Glucose-Capillary: 116 mg/dL — ABNORMAL HIGH (ref 70–99)
Glucose-Capillary: 143 mg/dL — ABNORMAL HIGH (ref 70–99)
Glucose-Capillary: 96 mg/dL (ref 70–99)

## 2021-06-06 MED ORDER — PREGABALIN 75 MG PO CAPS
300.0000 mg | ORAL_CAPSULE | Freq: Once | ORAL | Status: AC
  Administered 2021-06-06: 300 mg via ORAL
  Filled 2021-06-06: qty 4

## 2021-06-06 NOTE — Care Management CC44 (Signed)
Condition Code 44 Documentation Completed ? ?Patient Details  ?Name: Todd Spencer ?MRN: [id number] ?Date of Birth: January 05, 1951 ? ? ?Condition Code 44 given:  Yes ?Patient signature on Condition Code 44 notice:  Yes ?Documentation of 2 MD's agreement:  Yes ?Code 44 added to claim:  Yes ? ? ? ?Candie Chroman, LCSW ?06/06/2021, 10:15 AM ? ?

## 2021-06-06 NOTE — Progress Notes (Signed)
[facility]) Hospital Liaison note: ? ?Notified via Epic workque from Dr. Max Sane of request for [place] services. Will continue to follow for disposition. ? ?Please call with any outpatient palliative questions or concerns. ? ?Thank you for the opportunity to participate in this patient's care. ? ?Thank you, ?Lorelee Market, LPN ?[facility] Hospital Liaison ?[phone] ?

## 2021-06-06 NOTE — Progress Notes (Signed)
Subjective:  ?CC: ?Todd Spencer is a 71 y.o. male  Hospital stay day 1,   gastroenteritis ? ?HPI: ?No issues overnight.  Tolerated clears and graham crackers, pain gone. Had another BM since last visit ? ?ROS:  ?General: Denies weight loss, weight gain, fatigue, fevers, chills, and night sweats. ?Heart: Denies chest pain, palpitations, racing heart, irregular heartbeat, leg pain or swelling, and decreased activity tolerance. ?Respiratory: Denies breathing difficulty, shortness of breath, wheezing, cough, and sputum. ?GI: Denies change in appetite, heartburn, nausea, vomiting, constipation, diarrhea, and blood in stool. ?GU: Denies difficulty urinating, pain with urinating, urgency, frequency, blood in urine. ? ? ?Objective:  ? ?Temp:  [97.5 ?F (36.4 ?C)-98.7 ?F (37.1 ?C)] 97.7 ?F (36.5 ?C) (04/18 0865) ?Pulse Rate:  [68-94] 80 (04/18 0437) ?Resp:  [17-20] 17 (04/18 0437) ?BP: (122-141)/(50-80) 141/73 (04/18 0437) ?SpO2:  [92 %-98 %] 97 % (04/18 0437)     Height: '5\' 11"'$  (180.3 cm) Weight: 106.6 kg BMI (Calculated): 32.79  ? ?Intake/Output this shift:  ?No intake or output data in the 24 hours ending 06/06/21 0839 ? ?Constitutional :  alert, cooperative, appears stated age, and no distress  ?Respiratory:  clear to auscultation bilaterally  ?Cardiovascular:  regular rate and rhythm  ?Gastrointestinal: soft, non-tender; bowel sounds normal; no masses,  no organomegaly and baseline protuberant belly .   ?Skin: Cool and moist. Midline incision  ?Psychiatric: Normal affect, non-agitated, not confused  ?   ?  ?LABS:  ? ?  Latest Ref Rng & Units 06/05/2021  ?  3:31 AM 01/12/2021  ?  2:37 PM 10/06/2020  ?  5:17 PM  ?CMP  ?Glucose 70 - 99 mg/dL 58   129   77    ?BUN 8 - 23 mg/dL '22   21   29    '$ ?Creatinine 0.61 - 1.24 mg/dL 1.25   1.35   1.05    ?Sodium 135 - 145 mmol/L 141   138   140    ?Potassium 3.5 - 5.1 mmol/L 3.5   5.3   4.3    ?Chloride 98 - 111 mmol/L 111   104   109    ?CO2 22 - 32 mmol/L '22   29   24    '$ ?Calcium  8.9 - 10.3 mg/dL 7.3   9.4   9.5    ?Total Protein 6.5 - 8.1 g/dL 5.4    7.6    ?Total Bilirubin 0.3 - 1.2 mg/dL 0.5    0.6    ?Alkaline Phos 38 - 126 U/L 54    73    ?AST 15 - 41 U/L 39    20    ?ALT 0 - 44 U/L 23    13    ? ? ?  Latest Ref Rng & Units 06/05/2021  ?  2:00 AM 01/12/2021  ?  2:37 PM 10/06/2020  ?  5:17 PM  ?CBC  ?WBC 4.0 - 10.5 K/uL 5.2   6.1   6.5    ?Hemoglobin 13.0 - 17.0 g/dL 14.8   13.9   12.4    ?Hematocrit 39.0 - 52.0 % 46.3   43.2   36.4    ?Platelets 150 - 400 K/uL 139   128   119    ? ? ?RADS: ?N/a ?Assessment:  ? ?Gastroenteritis ? ?Advance diet as tolerated, ok to d/c from surgery standpoint.  F/u prn. ? ?labs/images/medications/previous chart entries reviewed personally and relevant changes/updates noted above. ? ? ?

## 2021-06-06 NOTE — Care Management Obs Status (Signed)
MEDICARE OBSERVATION STATUS NOTIFICATION ? ? ?Patient Details  ?Name: Todd Spencer ?MRN: [id number] ?Date of Birth: [birth year] ? ? ?Medicare Observation Status Notification Given:  Yes ? ? ? ?Candie Chroman, LCSW ?06/06/2021, 10:15 AM ?

## 2021-06-06 NOTE — TOC Transition Note (Signed)
Transition of Care (TOC) - CM/SW Discharge Note ? ? ?Patient Details  ?Name: Todd Spencer ?MRN: [id number] ?Date of Birth: 1951-02-17 ? ?Transition of Care (TOC) CM/SW Contact:  ?Candie Chroman, LCSW ?Phone Number: ?06/06/2021, 10:16 AM ? ? ?Clinical Narrative:  Patient has orders to discharge home today. He is not interested in home health at this time but will contact his PCP if he changes his mind after discharge. He does not have a ride home and is unable to pay for a cab. Filled out cab voucher for [address], [place] and put it on chart for RN to call when patient is ready. No further concerns. CSW signing off.  ? ?Final next level of care: Home/Self Care ?Barriers to Discharge: No Barriers Identified ? ? ?Patient Goals and CMS Choice ?  ?  ?  ? ?Discharge Placement ?  ?           ?  ?Patient to be transferred to facility by: Cab ?  ?Patient and family notified of of transfer: 06/06/21 ? ?Discharge Plan and Services ?  ?  ?           ?  ?  ?  ?  ?  ?  ?  ?  ?  ?  ? ?Social Determinants of Health (SDOH) Interventions ?  ? ? ?Readmission Risk Interventions ?   ? View : No data to display.  ?  ?  ?  ? ? ? ? ? ?

## 2021-06-08 NOTE — Discharge Summary (Signed)
?Physician Discharge Summary ?  ?Patient: Todd Spencer MRN: [id number] DOB: Jul 03, 1950  ?Admit date:     06/05/2021  ?Discharge date: 06/06/2021  ?Discharge Physician: Max Sane  ? ?PCP: Maryland Pink, MD  ? ?Recommendations at discharge:  ? ? F/up with outpt providers as requested ? ?Discharge Diagnoses: ?Principal Problem: ?  Small bowel obstruction, recurrent ([place]) ?Active Problems: ?  Hypoglycemia ?  AKI (acute kidney injury) ([place]) ?  Chronic systolic heart failure secondary to ischemic cardiomyopathy ([place]) ?  Coronary artery disease ?  DM2 (diabetes mellitus, type 2) ([place]) ?  HTN (hypertension) ?  Mixed Alzheimer's and vascular dementia ([place]) ?  SBO (small bowel obstruction) ([place]) ? ?Assessment and Plan: ?* Small bowel obstruction, recurrent ([place]) ?Gastroenteritis ?Patient had several BM while in the ED and tolerating diet. ?Seen by surgery and cleared for D/C ? ?Hypoglycemia ?Secondary to decreased oral intake from vomiting ?resolved ? ?AKI (acute kidney injury) ([place]) ?Prerenal. Resolved with hydration. ? ?Mixed Alzheimer's and vascular dementia ([place]) ?HTN (hypertension) ?DM2 (diabetes mellitus, type 2) ([place]) ?Coronary artery disease ?Chronic systolic heart failure secondary to ischemic cardiomyopathy ([place]) ? ? ? ?  ? ? ?Consultants: Surgery ?Disposition: Home ?Diet recommendation:  ?Discharge Diet Orders (From admission, onward)  ? ?  Start     Ordered  ? 06/06/21 0000  Diet - low sodium heart healthy       ? 06/06/21 0917  ? ?  ?  ? ?  ? ?Cardiac diet ?DISCHARGE MEDICATION: ?Allergies as of 06/06/2021   ?No Known Allergies ?  ? ?  ?Medication List  ?  ? ?STOP taking these medications   ? ?amoxicillin-clavulanate 875-125 MG tablet ?Commonly known as: Augmentin ?  ?traMADol 50 MG tablet ?Commonly known as: Ultram ?  ? ?  ? ?TAKE these medications   ? ?acetaminophen 500 MG tablet ?Commonly known as: TYLENOL ?Take 2 tablets (1,000 mg total) by mouth every 6 (six) hours. ?  ?aspirin 81 MG chewable tablet ?Chew  81 mg by mouth daily. ?  ?atorvastatin 40 MG tablet ?Commonly known as: LIPITOR ?Take 40 mg by mouth daily. ?  ?carboxymethylcellulose 1 % ophthalmic solution ?Apply 1 drop to eye as directed. ?  ?carvedilol 3.125 MG tablet ?Commonly known as: COREG ?Take 3.125 mg by mouth 2 (two) times daily with a meal. ?  ?diazepam 5 MG tablet ?Commonly known as: VALIUM ?Take 5 mg by mouth every 12 (twelve) hours as needed for anxiety (vertigo). ?  ?donepezil 10 MG tablet ?Commonly known as: ARICEPT ?Take 10 mg by mouth at bedtime. ?  ?doxepin 100 MG capsule ?Commonly known as: SINEQUAN ?Take 100-200 mg by mouth See admin instructions. Take 100 mg in the morning and 200 mg at night ?  ?famotidine 40 MG tablet ?Commonly known as: PEPCID ?Take 40 mg by mouth at bedtime. ?  ?gemfibrozil 600 MG tablet ?Commonly known as: LOPID ?Take 600 mg by mouth 2 (two) times daily before a meal. ?  ?hydrocortisone 2.5 % cream ?Apply 1 application. topically 2 (two) times daily as needed. ?  ?isosorbide dinitrate 30 MG tablet ?Commonly known as: ISORDIL ?Take 30 mg by mouth 3 (three) times daily. ?  ?Lantus 100 UNIT/ML injection ?Generic drug: insulin glargine ?Inject 100 Units into the skin at bedtime. ?  ?lisinopril 10 MG tablet ?Commonly known as: ZESTRIL ?Take 10 mg by mouth daily. ?  ?magnesium hydroxide 400 MG/5ML suspension ?Commonly known as: MILK OF MAGNESIA ?Take 15 mLs by mouth at bedtime. ?  ?  meclizine 25 MG tablet ?Commonly known as: ANTIVERT ?Take 25 mg by mouth 2 (two) times daily. ?  ?metFORMIN 500 MG 24 hr tablet ?Commonly known as: GLUCOPHAGE-XR ?Take 1,500 mg by mouth daily with supper. ?  ?multivitamin with minerals Tabs tablet ?Take 1 tablet by mouth daily. ?  ?nitroGLYCERIN 0.4 MG SL tablet ?Commonly known as: NITROSTAT ?Place 0.4 mg under the tongue every 5 (five) minutes as needed for chest pain. ?  ?NovoLIN R 100 units/mL injection ?Generic drug: insulin regular ?Inject 50 Units into the skin 3 (three) times daily before  meals. ?  ?ondansetron 4 MG disintegrating tablet ?Commonly known as: Zofran ODT ?Take 1 tablet (4 mg total) by mouth every 8 (eight) hours as needed for nausea or vomiting. ?  ?pregabalin 300 MG capsule ?Commonly known as: LYRICA ?Take 300 mg by mouth 2 (two) times daily. ?  ?sodium chloride 0.65 % Soln nasal spray ?Commonly known as: OCEAN ?Place 1 spray into both nostrils 2 (two) times daily as needed for congestion. ?  ? ?  ? ? Follow-up Information   ? ? Maryland Pink, MD. Schedule an appointment as soon as possible for a visit in 1 week(s).   ?Specialty: Family Medicine ?Why: [facility] Discharge F/UP ?Contact information: ?[address] [facility] ?[place] [address] ?[phone] ? ? ?  ?  ? ? Sakai, Isami, DO. Schedule an appointment as soon as possible for a visit in 2 week(s).   ?Specialty: Surgery ?Why: [facility] Discharge F/UP 8:30 ?Contact information: ?[address] ?[place] [address] ?[phone] ? ? ?  ?  ? ?  ?  ? ?  ? ?Discharge Exam: ?Filed Weights  ? 06/05/21 0152  ?Weight: 106.6 kg  ? ?Constitutional:   ?   General: He is not in acute distress. ?HENT:  ?   Head: Normocephalic and atraumatic.  ?Cardiovascular:  ?   Rate and Rhythm: Normal rate and regular rhythm.  ?   Pulses: Normal pulses.  ?   Heart sounds: Normal heart sounds.  ?Pulmonary:  ?   Effort: Pulmonary effort is normal.  ?   Breath sounds: Normal breath sounds.  ?Abdominal:  ?   Soft, benign, Protuberant belly ?Neurological: Nonfocal ? ?Condition at discharge: good ? ?The results of significant diagnostics from this hospitalization (including imaging, microbiology, ancillary and laboratory) are listed below for reference.  ? ?Imaging Studies: ?DG Abdomen 1 View ? ?Result Date: 06/05/2021 ?CLINICAL DATA:  71 year old male enteric tube placement. EXAM: ABDOMEN - 1 VIEW COMPARISON:  CT Abdomen and Pelvis 0249 hours today. FINDINGS: Portable AP upright view at 0626 hours. Enteric tube tip placed into the  stomach. Side hole not identified but probably still in the distal esophagus. Lung bases appear stable, negative. Stomach appears more decompressed and upper abdominal bowel gas pattern mildly improved. Upper abdominal surgical clips. IMPRESSION: 1. Enteric tube tip placed into the stomach, but side hole likely still in the distal esophagus. Recommend advancing the tube 5 cm. 2. Stomach decompressed and upper abdominal bowel gas pattern improved since 0249 hours. Electronically Signed   By: Genevie Ann M.D.   On: 06/05/2021 06:51  ? ?CT ABDOMEN PELVIS W CONTRAST ? ?Result Date: 06/05/2021 ?CLINICAL DATA:  Abdominal pain with nausea and vomiting. EXAM: CT ABDOMEN AND PELVIS WITH CONTRAST TECHNIQUE: Multidetector CT imaging of the abdomen and pelvis was performed using the standard protocol following bolus administration of intravenous contrast. RADIATION DOSE REDUCTION: This exam was performed according to the  departmental dose-optimization program which includes automated exposure control, adjustment of the mA and/or kV according to patient size and/or use of iterative reconstruction technique. CONTRAST:  169m OMNIPAQUE IOHEXOL 300 MG/ML  SOLN COMPARISON:  October 07, 2020 FINDINGS: Lower chest: No acute abnormality. Hepatobiliary: No focal liver abnormality is seen. Status post cholecystectomy. No biliary dilatation. Pancreas: Unremarkable. No pancreatic ductal dilatation or surrounding inflammatory changes. Spleen: Normal in size without focal abnormality. Adrenals/Urinary Tract: Adrenal glands are unremarkable. Kidneys are normal in size, without renal calculi or hydronephrosis. Stable bilateral simple renal cysts are seen. No additional follow-up or imaging is recommended. Bladder is unremarkable. Stomach/Bowel: Stomach is within normal limits. Appendix appears normal. Multiple dilated small bowel loops are seen throughout the abdomen a clear transition zone is not identified. Noninflamed diverticula are seen  throughout the sigmoid colon. Vascular/Lymphatic: Aortic atherosclerosis. No enlarged abdominal or pelvic lymph nodes. Reproductive: There is mild to moderate severity prostate gland enlargement. Other: Multiple sm

## 2021-06-09 ENCOUNTER — Telehealth: Payer: Self-pay

## 2021-06-09 ENCOUNTER — Other Ambulatory Visit: Payer: Self-pay

## 2021-06-09 NOTE — Patient Instructions (Signed)
Patient Goals/Self-Care Activities: Diabetes ?Take all medications as prescribed ?Attend all scheduled provider appointments ?check blood sugar at prescribed times: 4 times daily ?check feet daily for cuts, sores or redness ?take the blood sugar log to all doctor visits ?manage portion size ?

## 2021-06-09 NOTE — Telephone Encounter (Signed)
Attempted to contact patient to schedule a Palliative Care consult appointment. No answer left a message to return call.  

## 2021-06-09 NOTE — Patient Outreach (Signed)
[facility]) Care Management ? ?06/09/2021 ? ?Todd Spencer ?March 04, 1950 ?[id number] ? ? ?Telephone call to patient for follow up. Patient reports doing ok.  Recent ED visit for possible bowel obstruction.  Patient states that he has lots of scar tissue in his abdomen that causes problems.  He states that it did resolve.  He states he is supposed to have a colonoscopy next month.  ? ?Patient continues with diabetes management.  Blood sugars ranging in the 80's in the am. Discussed importance of good diabetes management.  Patient verbalized understanding and voices no concerns.   ? ?Patient continues to live alone and is independent with care and still drives.  Patient mows grass frequently.  Discussed preventing heat stroke and pacing self with lawn care.  He verbalized understanding.   ? ?Care Plan : RN CM Plan of Care  ?Updates made by Jon Billings, RN since 06/09/2021 12:00 AM  ?  ? ?Problem: Chronic Disease Management and Care Coordination needs for Diabetes   ?Priority: High  ?  ? ?Long-Range Goal: Development of Plan of Care for Management of Diabetes   ?Start Date: 06/09/2021  ?Expected End Date: 02/18/2022  ?This Visit's Progress: On track  ?Priority: High  ?Note:   ?Current Barriers:  ?Chronic Disease Management support and education needs related to DMII  ? ?RNCM Clinical Goal(s):  ?Patient will verbalize basic understanding of  DMII disease process and self health management plan as evidenced by A1c less than 7 ?continue to work with RN Care Manager to address care management and care coordination needs related to  DMII as evidenced by adherence to CM Team Scheduled appointments through collaboration with RN Care manager, provider, and care team.  ? ?Interventions: ?Education and support related to diabetes ?Inter-disciplinary care team collaboration (see longitudinal plan of care) ?Evaluation of current treatment plan related to  self management and patient's adherence to plan as  established by provider ? ? ?Diabetes Interventions:  (Status:  Goal on track:  Yes.) Long Term Goal ?Assessed patient's understanding of A1c goal: <7% ?Provided education to patient about basic DM disease process ?Discussed plans with patient for ongoing care management follow up and provided patient with direct contact information for care management team ?Lab Results  ?Component Value Date  ? HGBA1C 7.1 (H) 06/05/2021  ?06/09/21  Patient Ac last report 7.1. Patient reports sugars in the AM around 80.  Discussed diabetes management and importance.   ? ? ?Patient Goals/Self-Care Activities: Diabetes ?Take all medications as prescribed ?Attend all scheduled provider appointments ?check blood sugar at prescribed times: 4 times daily ?check feet daily for cuts, sores or redness ?take the blood sugar log to all doctor visits ?manage portion size ? ?Follow Up Plan:  Telephone follow up appointment with care management team member scheduled for:  May ?The patient has been provided with contact information for the care management team and has been advised to call with any health related questions or concerns.  ? ?  ? ?Plan: Follow-up: Patient agrees to Care Plan and Follow-up. ?Follow-up in May.   ? ?[name], RN, MSN ?[facility] Care Management ?Care Management Coordinator ?Direct Line [phone] ?Toll Free: [phone]  ?Fax: [phone] ?  ?

## 2021-06-13 ENCOUNTER — Telehealth: Payer: Self-pay | Admitting: Nurse Practitioner

## 2021-06-13 NOTE — Telephone Encounter (Signed)
Attempted to contact patient to schedule Palliative Consult, no answer and unable to leave message due to mailbox was full.   ? ?I also attempted to reach patient's friend, Margaretha Sheffield (listed as contact person in [place]) with no answer - left VM requesting a return call. ?

## 2021-06-27 DIAGNOSIS — H34831 Tributary (branch) retinal vein occlusion, right eye, with macular edema: Secondary | ICD-10-CM | POA: Diagnosis not present

## 2021-07-03 ENCOUNTER — Telehealth: Payer: Self-pay | Admitting: Nurse Practitioner

## 2021-07-03 NOTE — Telephone Encounter (Signed)
Spoke with patient regarding the Palliative referral/services and he was in agreement with scheduling visit.  I have scheduled a Telehealth Consult for 07/04/21 @ 2:30 PM. ?

## 2021-07-04 ENCOUNTER — Other Ambulatory Visit: Payer: PPO | Admitting: Nurse Practitioner

## 2021-07-04 ENCOUNTER — Telehealth: Payer: Self-pay | Admitting: Nurse Practitioner

## 2021-07-04 NOTE — Telephone Encounter (Signed)
I called Todd Spencer for initial pc visit, endorses he is working on a floor and will need to reschedule. Rescheduled per request, contact information given ?

## 2021-07-05 ENCOUNTER — Ambulatory Visit: Payer: Self-pay

## 2021-07-05 ENCOUNTER — Other Ambulatory Visit: Payer: Self-pay

## 2021-07-05 NOTE — Patient Outreach (Signed)
[facility]) Care Management ? ?07/05/2021 ? ?Todd Spencer ?1950-04-17 ?[id number] ? ? ?Telephone call to patient for disease management follow up. No answer.  Unable to leave a message. ? ?Plan: RN CM will attempt again in the month of July.   ? ?[name], RN, MSN ?[facility] Care Management ?Care Management Coordinator ?Direct Line [phone] ?Toll Free: [phone]  ?Fax: [phone] ? ?

## 2021-07-11 ENCOUNTER — Encounter: Payer: Self-pay | Admitting: Nurse Practitioner

## 2021-07-11 ENCOUNTER — Other Ambulatory Visit: Payer: PPO | Admitting: Nurse Practitioner

## 2021-07-11 DIAGNOSIS — E162 Hypoglycemia, unspecified: Secondary | ICD-10-CM

## 2021-07-11 DIAGNOSIS — E118 Type 2 diabetes mellitus with unspecified complications: Secondary | ICD-10-CM

## 2021-07-11 DIAGNOSIS — Z515 Encounter for palliative care: Secondary | ICD-10-CM
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
## 2021-07-12 DIAGNOSIS — I10 Essential (primary) hypertension: Secondary | ICD-10-CM | POA: Diagnosis not present

## 2021-07-12 DIAGNOSIS — I2510 Atherosclerotic heart disease of native coronary artery without angina pectoris: Secondary | ICD-10-CM | POA: Diagnosis not present

## 2021-07-12 DIAGNOSIS — E782 Mixed hyperlipidemia: Secondary | ICD-10-CM | POA: Diagnosis not present

## 2021-07-12 DIAGNOSIS — R001 Bradycardia, unspecified: Secondary | ICD-10-CM | POA: Diagnosis not present

## 2021-07-12 DIAGNOSIS — E669 Obesity, unspecified: Secondary | ICD-10-CM | POA: Diagnosis not present

## 2021-07-12 DIAGNOSIS — I255 Ischemic cardiomyopathy: Secondary | ICD-10-CM | POA: Diagnosis not present

## 2021-07-20 DIAGNOSIS — H5711 Ocular pain, right eye: Secondary | ICD-10-CM | POA: Diagnosis not present

## 2021-07-28 ENCOUNTER — Telehealth: Payer: Self-pay | Admitting: Nurse Practitioner

## 2021-07-28 NOTE — Telephone Encounter (Signed)
I called to reschedule PC f/u visit due to provider being out of office, unable to leave a message, will continue to try to reach

## 2021-08-07 DIAGNOSIS — Z9889 Other specified postprocedural states: Secondary | ICD-10-CM | POA: Diagnosis not present

## 2021-08-07 DIAGNOSIS — I255 Ischemic cardiomyopathy: Secondary | ICD-10-CM | POA: Diagnosis not present

## 2021-08-07 DIAGNOSIS — Z8719 Personal history of other diseases of the digestive system: Secondary | ICD-10-CM | POA: Diagnosis not present

## 2021-08-07 DIAGNOSIS — Z1211 Encounter for screening for malignant neoplasm of colon: Secondary | ICD-10-CM | POA: Diagnosis not present

## 2021-08-10 ENCOUNTER — Telehealth: Payer: Self-pay | Admitting: Nurse Practitioner

## 2021-08-10 DIAGNOSIS — L309 Dermatitis, unspecified: Secondary | ICD-10-CM | POA: Diagnosis not present

## 2021-08-10 NOTE — Telephone Encounter (Signed)
Attempted to contact patient to reschedule the 08/16/21  Palliative f/u visit because NP will be on PTO next week, patient did not answer and unable to leave a message due to mailbox was full.  I then attempted to contact patient's friend Margaretha Sheffield, with no answer - left message asking her to let patient know to call me back to reschedule or asked her to call me back.

## 2021-08-10 NOTE — Telephone Encounter (Signed)
I called Mr. Finelli to reschedule f/u visit due to provider being out of town, no answer, unable to leave a message

## 2021-08-16 ENCOUNTER — Other Ambulatory Visit: Payer: PPO | Admitting: Nurse Practitioner

## 2021-08-25 ENCOUNTER — Other Ambulatory Visit: Payer: Self-pay

## 2021-08-25 NOTE — Patient Outreach (Signed)
[facility]) Care Management  08/25/2021  Todd Spencer 1950-11-07 [id number]   Telephone call to patient for follow up.Patient doing well.  Blood sugars in the 100's. Patient meeting goals.  Discussed case closure.  Patient agreeable.    Care Plan : RN CM Plan of Care  Updates made by Jon Billings, RN since 08/25/2021 12:00 AM  Completed 08/25/2021   Problem: Chronic Disease Management and Care Coordination needs for Diabetes Resolved 08/25/2021  Priority: High     Long-Range Goal: Development of Plan of Care for Management of Diabetes Completed 08/25/2021  Start Date: 06/09/2021  Expected End Date: 02/18/2022  Recent Progress: On track  Priority: High  Note:   Current Barriers:  Chronic Disease Management support and education needs related to DMII   RNCM Clinical Goal(s):  Patient will verbalize basic understanding of  DMII disease process and self health management plan as evidenced by A1c less than 7 continue to work with RN Care Manager to address care management and care coordination needs related to  DMII as evidenced by adherence to CM Team Scheduled appointments through collaboration with RN Care manager, provider, and care team.   Interventions: Education and support related to diabetes Inter-disciplinary care team collaboration (see longitudinal plan of care) Evaluation of current treatment plan related to  self management and patient's adherence to plan as established by provider   Diabetes Interventions:  (Status:  Goal Met.) Long Term Goal Assessed patient's understanding of A1c goal: <7% Provided education to patient about basic DM disease process Discussed plans with patient for ongoing care management follow up and provided patient with direct contact information for care management team Lab Results  Component Value Date   HGBA1C 7.1 (H) 06/05/2021  06/09/21  Patient Ac last report 7.1. Patient reports sugars in the AM around 80.  Discussed  diabetes management and importance.     Patient Goals/Self-Care Activities: Diabetes Take all medications as prescribed Attend all scheduled provider appointments check blood sugar at prescribed times: 4 times daily check feet daily for cuts, sores or redness take the blood sugar log to all doctor visits manage portion size  Follow Up Plan:  RN CM will close case as patient doing well.    Plan: RN  CM will close case.  [name], RN, MSN [facility] Care Management Care Management Coordinator Direct Line [phone] Toll Free: [phone]  Fax: [phone]

## 2021-08-29 DIAGNOSIS — H34831 Tributary (branch) retinal vein occlusion, right eye, with macular edema: Secondary | ICD-10-CM | POA: Diagnosis not present

## 2021-09-01 ENCOUNTER — Telehealth: Payer: Self-pay | Admitting: Nurse Practitioner

## 2021-09-01 NOTE — Telephone Encounter (Signed)
I attempted to contact Todd Spencer to schedule f/u pc visit, mail box full, unable to leave a message.

## 2021-09-07 DIAGNOSIS — E1122 Type 2 diabetes mellitus with diabetic chronic kidney disease: Secondary | ICD-10-CM | POA: Diagnosis not present

## 2021-09-07 DIAGNOSIS — Z794 Long term (current) use of insulin: Secondary | ICD-10-CM | POA: Diagnosis not present

## 2021-09-07 DIAGNOSIS — N1831 Chronic kidney disease, stage 3a: Secondary | ICD-10-CM | POA: Diagnosis not present

## 2021-09-11 DIAGNOSIS — E1122 Type 2 diabetes mellitus with diabetic chronic kidney disease: Secondary | ICD-10-CM | POA: Diagnosis not present

## 2021-09-11 DIAGNOSIS — E1159 Type 2 diabetes mellitus with other circulatory complications: Secondary | ICD-10-CM | POA: Diagnosis not present

## 2021-09-11 DIAGNOSIS — E113293 Type 2 diabetes mellitus with mild nonproliferative diabetic retinopathy without macular edema, bilateral: Secondary | ICD-10-CM | POA: Diagnosis not present

## 2021-09-11 DIAGNOSIS — Z794 Long term (current) use of insulin: Secondary | ICD-10-CM | POA: Diagnosis not present

## 2021-09-11 DIAGNOSIS — N1831 Chronic kidney disease, stage 3a: Secondary | ICD-10-CM | POA: Diagnosis not present

## 2021-09-30 ENCOUNTER — Emergency Department: Payer: Medicare HMO

## 2021-09-30 ENCOUNTER — Other Ambulatory Visit: Payer: Self-pay

## 2021-09-30 ENCOUNTER — Encounter: Admission: EM | Disposition: E | Payer: Self-pay | Source: Home / Self Care | Attending: Internal Medicine

## 2021-09-30 ENCOUNTER — Inpatient Hospital Stay: Payer: Medicare HMO | Admitting: General Practice

## 2021-09-30 ENCOUNTER — Inpatient Hospital Stay: Payer: Medicare HMO

## 2021-09-30 ENCOUNTER — Inpatient Hospital Stay
Admission: EM | Admit: 2021-09-30 | Discharge: 2021-11-19 | DRG: 003 | Disposition: E | Payer: Medicare HMO | Attending: Obstetrics and Gynecology | Admitting: Obstetrics and Gynecology

## 2021-09-30 DIAGNOSIS — G309 Alzheimer's disease, unspecified: Secondary | ICD-10-CM | POA: Diagnosis present

## 2021-09-30 DIAGNOSIS — Z7984 Long term (current) use of oral hypoglycemic drugs: Secondary | ICD-10-CM | POA: Diagnosis not present

## 2021-09-30 DIAGNOSIS — Z794 Long term (current) use of insulin: Secondary | ICD-10-CM

## 2021-09-30 DIAGNOSIS — K4090 Unilateral inguinal hernia, without obstruction or gangrene, not specified as recurrent: Secondary | ICD-10-CM | POA: Diagnosis not present

## 2021-09-30 DIAGNOSIS — D62 Acute posthemorrhagic anemia: Secondary | ICD-10-CM | POA: Diagnosis not present

## 2021-09-30 DIAGNOSIS — K632 Fistula of intestine: Secondary | ICD-10-CM | POA: Diagnosis present

## 2021-09-30 DIAGNOSIS — F05 Delirium due to known physiological condition: Secondary | ICD-10-CM | POA: Diagnosis not present

## 2021-09-30 DIAGNOSIS — J9811 Atelectasis: Secondary | ICD-10-CM | POA: Diagnosis not present

## 2021-09-30 DIAGNOSIS — Z79899 Other long term (current) drug therapy: Secondary | ICD-10-CM

## 2021-09-30 DIAGNOSIS — I700 Atherosclerosis of aorta: Secondary | ICD-10-CM | POA: Diagnosis present

## 2021-09-30 DIAGNOSIS — J9600 Acute respiratory failure, unspecified whether with hypoxia or hypercapnia: Secondary | ICD-10-CM | POA: Diagnosis not present

## 2021-09-30 DIAGNOSIS — R571 Hypovolemic shock: Secondary | ICD-10-CM | POA: Diagnosis not present

## 2021-09-30 DIAGNOSIS — E870 Hyperosmolality and hypernatremia: Secondary | ICD-10-CM | POA: Diagnosis not present

## 2021-09-30 DIAGNOSIS — R4182 Altered mental status, unspecified: Secondary | ICD-10-CM | POA: Diagnosis not present

## 2021-09-30 DIAGNOSIS — G4733 Obstructive sleep apnea (adult) (pediatric): Secondary | ICD-10-CM | POA: Diagnosis present

## 2021-09-30 DIAGNOSIS — I5022 Chronic systolic (congestive) heart failure: Secondary | ICD-10-CM | POA: Diagnosis not present

## 2021-09-30 DIAGNOSIS — Z6832 Body mass index (BMI) 32.0-32.9, adult: Secondary | ICD-10-CM | POA: Diagnosis not present

## 2021-09-30 DIAGNOSIS — I252 Old myocardial infarction: Secondary | ICD-10-CM

## 2021-09-30 DIAGNOSIS — N281 Cyst of kidney, acquired: Secondary | ICD-10-CM | POA: Diagnosis not present

## 2021-09-30 DIAGNOSIS — Z9889 Other specified postprocedural states: Secondary | ICD-10-CM | POA: Diagnosis not present

## 2021-09-30 DIAGNOSIS — E785 Hyperlipidemia, unspecified: Secondary | ICD-10-CM | POA: Diagnosis present

## 2021-09-30 DIAGNOSIS — R000 Tachycardia, unspecified: Secondary | ICD-10-CM | POA: Diagnosis not present

## 2021-09-30 DIAGNOSIS — E875 Hyperkalemia: Secondary | ICD-10-CM

## 2021-09-30 DIAGNOSIS — K55059 Acute (reversible) ischemia of intestine, part and extent unspecified: Secondary | ICD-10-CM | POA: Diagnosis not present

## 2021-09-30 DIAGNOSIS — Z4682 Encounter for fitting and adjustment of non-vascular catheter: Secondary | ICD-10-CM | POA: Diagnosis not present

## 2021-09-30 DIAGNOSIS — R1084 Generalized abdominal pain: Secondary | ICD-10-CM | POA: Diagnosis not present

## 2021-09-30 DIAGNOSIS — G9341 Metabolic encephalopathy: Secondary | ICD-10-CM | POA: Diagnosis not present

## 2021-09-30 DIAGNOSIS — K560 Paralytic ileus: Secondary | ICD-10-CM | POA: Diagnosis not present

## 2021-09-30 DIAGNOSIS — Z955 Presence of coronary angioplasty implant and graft: Secondary | ICD-10-CM

## 2021-09-30 DIAGNOSIS — E669 Obesity, unspecified: Secondary | ICD-10-CM | POA: Diagnosis present

## 2021-09-30 DIAGNOSIS — J9601 Acute respiratory failure with hypoxia: Secondary | ICD-10-CM | POA: Diagnosis not present

## 2021-09-30 DIAGNOSIS — R188 Other ascites: Secondary | ICD-10-CM | POA: Diagnosis not present

## 2021-09-30 DIAGNOSIS — E781 Pure hyperglyceridemia: Secondary | ICD-10-CM | POA: Diagnosis present

## 2021-09-30 DIAGNOSIS — Z807 Family history of other malignant neoplasms of lymphoid, hematopoietic and related tissues: Secondary | ICD-10-CM

## 2021-09-30 DIAGNOSIS — G6281 Critical illness polyneuropathy: Secondary | ICD-10-CM | POA: Diagnosis not present

## 2021-09-30 DIAGNOSIS — K5730 Diverticulosis of large intestine without perforation or abscess without bleeding: Secondary | ICD-10-CM | POA: Diagnosis not present

## 2021-09-30 DIAGNOSIS — J9602 Acute respiratory failure with hypercapnia: Secondary | ICD-10-CM | POA: Diagnosis not present

## 2021-09-30 DIAGNOSIS — K567 Ileus, unspecified: Secondary | ICD-10-CM | POA: Diagnosis not present

## 2021-09-30 DIAGNOSIS — E1142 Type 2 diabetes mellitus with diabetic polyneuropathy: Secondary | ICD-10-CM | POA: Diagnosis present

## 2021-09-30 DIAGNOSIS — E8721 Acute metabolic acidosis: Secondary | ICD-10-CM | POA: Diagnosis present

## 2021-09-30 DIAGNOSIS — N183 Chronic kidney disease, stage 3 unspecified: Secondary | ICD-10-CM | POA: Diagnosis not present

## 2021-09-30 DIAGNOSIS — Z87891 Personal history of nicotine dependence: Secondary | ICD-10-CM | POA: Diagnosis not present

## 2021-09-30 DIAGNOSIS — Z66 Do not resuscitate: Secondary | ICD-10-CM | POA: Diagnosis not present

## 2021-09-30 DIAGNOSIS — K5651 Intestinal adhesions [bands], with partial obstruction: Secondary | ICD-10-CM | POA: Diagnosis not present

## 2021-09-30 DIAGNOSIS — F0280 Dementia in other diseases classified elsewhere without behavioral disturbance: Secondary | ICD-10-CM | POA: Diagnosis present

## 2021-09-30 DIAGNOSIS — K631 Perforation of intestine (nontraumatic): Secondary | ICD-10-CM | POA: Diagnosis not present

## 2021-09-30 DIAGNOSIS — E876 Hypokalemia: Secondary | ICD-10-CM | POA: Diagnosis not present

## 2021-09-30 DIAGNOSIS — N1831 Chronic kidney disease, stage 3a: Secondary | ICD-10-CM | POA: Diagnosis present

## 2021-09-30 DIAGNOSIS — I130 Hypertensive heart and chronic kidney disease with heart failure and stage 1 through stage 4 chronic kidney disease, or unspecified chronic kidney disease: Secondary | ICD-10-CM | POA: Diagnosis not present

## 2021-09-30 DIAGNOSIS — I255 Ischemic cardiomyopathy: Secondary | ICD-10-CM | POA: Diagnosis present

## 2021-09-30 DIAGNOSIS — R109 Unspecified abdominal pain: Secondary | ICD-10-CM | POA: Diagnosis not present

## 2021-09-30 DIAGNOSIS — K559 Vascular disorder of intestine, unspecified: Secondary | ICD-10-CM

## 2021-09-30 DIAGNOSIS — K6389 Other specified diseases of intestine: Secondary | ICD-10-CM | POA: Diagnosis present

## 2021-09-30 DIAGNOSIS — R0602 Shortness of breath: Secondary | ICD-10-CM | POA: Diagnosis not present

## 2021-09-30 DIAGNOSIS — J690 Pneumonitis due to inhalation of food and vomit: Secondary | ICD-10-CM | POA: Diagnosis not present

## 2021-09-30 DIAGNOSIS — Z8249 Family history of ischemic heart disease and other diseases of the circulatory system: Secondary | ICD-10-CM

## 2021-09-30 DIAGNOSIS — R0902 Hypoxemia: Secondary | ICD-10-CM | POA: Diagnosis not present

## 2021-09-30 DIAGNOSIS — G8929 Other chronic pain: Secondary | ICD-10-CM | POA: Diagnosis not present

## 2021-09-30 DIAGNOSIS — N170 Acute kidney failure with tubular necrosis: Secondary | ICD-10-CM | POA: Diagnosis not present

## 2021-09-30 DIAGNOSIS — E1100 Type 2 diabetes mellitus with hyperosmolarity without nonketotic hyperglycemic-hyperosmolar coma (NKHHC): Secondary | ICD-10-CM | POA: Diagnosis not present

## 2021-09-30 DIAGNOSIS — Z7982 Long term (current) use of aspirin: Secondary | ICD-10-CM

## 2021-09-30 DIAGNOSIS — K565 Intestinal adhesions [bands], unspecified as to partial versus complete obstruction: Secondary | ICD-10-CM | POA: Diagnosis not present

## 2021-09-30 DIAGNOSIS — E119 Type 2 diabetes mellitus without complications: Secondary | ICD-10-CM

## 2021-09-30 DIAGNOSIS — R1111 Vomiting without nausea: Secondary | ICD-10-CM | POA: Diagnosis not present

## 2021-09-30 DIAGNOSIS — R110 Nausea: Secondary | ICD-10-CM | POA: Diagnosis not present

## 2021-09-30 DIAGNOSIS — E1122 Type 2 diabetes mellitus with diabetic chronic kidney disease: Secondary | ICD-10-CM | POA: Diagnosis present

## 2021-09-30 DIAGNOSIS — F0150 Vascular dementia without behavioral disturbance: Secondary | ICD-10-CM | POA: Diagnosis present

## 2021-09-30 DIAGNOSIS — D6959 Other secondary thrombocytopenia: Secondary | ICD-10-CM | POA: Diagnosis not present

## 2021-09-30 DIAGNOSIS — K660 Peritoneal adhesions (postprocedural) (postinfection): Secondary | ICD-10-CM | POA: Diagnosis not present

## 2021-09-30 DIAGNOSIS — J189 Pneumonia, unspecified organism: Secondary | ICD-10-CM | POA: Diagnosis not present

## 2021-09-30 DIAGNOSIS — Z9049 Acquired absence of other specified parts of digestive tract: Secondary | ICD-10-CM

## 2021-09-30 DIAGNOSIS — Z515 Encounter for palliative care: Secondary | ICD-10-CM | POA: Diagnosis not present

## 2021-09-30 DIAGNOSIS — Z96653 Presence of artificial knee joint, bilateral: Secondary | ICD-10-CM | POA: Diagnosis present

## 2021-09-30 DIAGNOSIS — R1110 Vomiting, unspecified: Secondary | ICD-10-CM | POA: Diagnosis not present

## 2021-09-30 DIAGNOSIS — K7689 Other specified diseases of liver: Secondary | ICD-10-CM | POA: Diagnosis not present

## 2021-09-30 DIAGNOSIS — R112 Nausea with vomiting, unspecified: Secondary | ICD-10-CM | POA: Diagnosis not present

## 2021-09-30 DIAGNOSIS — K56609 Unspecified intestinal obstruction, unspecified as to partial versus complete obstruction: Secondary | ICD-10-CM | POA: Diagnosis present

## 2021-09-30 DIAGNOSIS — A419 Sepsis, unspecified organism: Secondary | ICD-10-CM | POA: Diagnosis not present

## 2021-09-30 DIAGNOSIS — E1165 Type 2 diabetes mellitus with hyperglycemia: Secondary | ICD-10-CM | POA: Diagnosis present

## 2021-09-30 DIAGNOSIS — J9690 Respiratory failure, unspecified, unspecified whether with hypoxia or hypercapnia: Secondary | ICD-10-CM | POA: Diagnosis not present

## 2021-09-30 DIAGNOSIS — K625 Hemorrhage of anus and rectum: Secondary | ICD-10-CM | POA: Diagnosis not present

## 2021-09-30 DIAGNOSIS — J8410 Pulmonary fibrosis, unspecified: Secondary | ICD-10-CM | POA: Diagnosis not present

## 2021-09-30 DIAGNOSIS — I2510 Atherosclerotic heart disease of native coronary artery without angina pectoris: Secondary | ICD-10-CM | POA: Diagnosis present

## 2021-09-30 HISTORY — PX: LAPAROTOMY: SHX154

## 2021-09-30 HISTORY — PX: LYSIS OF ADHESION: SHX5961

## 2021-09-30 LAB — COMPREHENSIVE METABOLIC PANEL
ALT: 27 U/L (ref 0–44)
ALT: 22 U/L (ref 0–44)
AST: 51 U/L — ABNORMAL HIGH (ref 15–41)
AST: 45 U/L — ABNORMAL HIGH (ref 15–41)
Albumin: 4.5 g/dL (ref 3.5–5.0)
Albumin: 3.4 g/dL — ABNORMAL LOW (ref 3.5–5.0)
Alkaline Phosphatase: 55 U/L (ref 38–126)
Alkaline Phosphatase: 39 U/L (ref 38–126)
Anion gap: 12 (ref 5–15)
Anion gap: 6 (ref 5–15)
BUN: 32 mg/dL — ABNORMAL HIGH (ref 8–23)
BUN: 38 mg/dL — ABNORMAL HIGH (ref 8–23)
CO2: 25 mmol/L (ref 22–32)
CO2: 24 mmol/L (ref 22–32)
Calcium: 10.3 mg/dL (ref 8.9–10.3)
Calcium: 8.1 mg/dL — ABNORMAL LOW (ref 8.9–10.3)
Chloride: 104 mmol/L (ref 98–111)
Chloride: 106 mmol/L (ref 98–111)
Creatinine, Ser: 1.85 mg/dL — ABNORMAL HIGH (ref 0.61–1.24)
Creatinine, Ser: 1.77 mg/dL — ABNORMAL HIGH (ref 0.61–1.24)
GFR, Estimated: 38 mL/min — ABNORMAL LOW (ref >60)
GFR, Estimated: 40 mL/min — ABNORMAL LOW (ref >60)
Glucose, Bld: 128 mg/dL — ABNORMAL HIGH (ref 70–99)
Glucose, Bld: 229 mg/dL — ABNORMAL HIGH (ref 70–99)
Potassium: 4.8 mmol/L (ref 3.5–5.1)
Potassium: >7.5 mmol/L (ref 3.5–5.1)
Sodium: 141 mmol/L (ref 135–145)
Sodium: 136 mmol/L (ref 135–145)
Total Bilirubin: 0.9 mg/dL (ref 0.3–1.2)
Total Bilirubin: 1.3 mg/dL — ABNORMAL HIGH (ref 0.3–1.2)
Total Protein: 8.3 g/dL — ABNORMAL HIGH (ref 6.5–8.1)
Total Protein: 6.4 g/dL — ABNORMAL LOW (ref 6.5–8.1)

## 2021-09-30 LAB — BLOOD GAS, ARTERIAL
Acid-base deficit: 2.6 mmol/L — ABNORMAL HIGH (ref 0.0–2.0)
Acid-base deficit: 1.8 mmol/L (ref 0.0–2.0)
Acid-base deficit: 3.7 mmol/L — ABNORMAL HIGH (ref 0.0–2.0)
Bicarbonate: 26.1 mmol/L (ref 20.0–28.0)
Bicarbonate: 25.8 mmol/L (ref 20.0–28.0)
Bicarbonate: 23.5 mmol/L (ref 20.0–28.0)
FIO2: 0.4 %
FIO2: 40 %
FIO2: 40 %
MECHVT: 500 mL
MECHVT: 500 mL
MECHVT: 500 mL
Mechanical Rate: 20
Mechanical Rate: 22
O2 Saturation: 95.5 %
O2 Saturation: 97.6 %
O2 Saturation: 94.5 %
PEEP: 8 cmH2O
PEEP: 8 cmH2O
PEEP: 8 cmH2O
Patient temperature: 37
Patient temperature: 37
Patient temperature: 37
RATE: 16 resp/min
pCO2 arterial: 61 mmHg — ABNORMAL HIGH (ref 32–48)
pCO2 arterial: 55 mmHg — ABNORMAL HIGH (ref 32–48)
pCO2 arterial: 50 mmHg — ABNORMAL HIGH (ref 32–48)
pH, Arterial: 7.24 — ABNORMAL LOW (ref 7.35–7.45)
pH, Arterial: 7.28 — ABNORMAL LOW (ref 7.35–7.45)
pH, Arterial: 7.28 — ABNORMAL LOW (ref 7.35–7.45)
pO2, Arterial: 77 mmHg — ABNORMAL LOW (ref 83–108)
pO2, Arterial: 82 mmHg — ABNORMAL LOW (ref 83–108)
pO2, Arterial: 86 mmHg (ref 83–108)

## 2021-09-30 LAB — GLUCOSE, CAPILLARY
Glucose-Capillary: 216 mg/dL — ABNORMAL HIGH (ref 70–99)
Glucose-Capillary: 182 mg/dL — ABNORMAL HIGH (ref 70–99)
Glucose-Capillary: 243 mg/dL — ABNORMAL HIGH (ref 70–99)

## 2021-09-30 LAB — URINALYSIS, COMPLETE (UACMP) WITH MICROSCOPIC
Bacteria, UA: NONE SEEN
Bilirubin Urine: NEGATIVE
Glucose, UA: NEGATIVE mg/dL
Ketones, ur: NEGATIVE mg/dL
Leukocytes,Ua: NEGATIVE
Nitrite: NEGATIVE
Protein, ur: 30 mg/dL — AB
Specific Gravity, Urine: >1.046 — ABNORMAL HIGH (ref 1.005–1.030)
pH: 5 (ref 5.0–8.0)

## 2021-09-30 LAB — CBC WITH DIFFERENTIAL/PLATELET
Abs Immature Granulocytes: 0.03 10*3/uL (ref 0.00–0.07)
Abs Immature Granulocytes: 0.05 10*3/uL (ref 0.00–0.07)
Basophils Absolute: 0 10*3/uL (ref 0.0–0.1)
Basophils Absolute: 0 10*3/uL (ref 0.0–0.1)
Basophils Relative: 1 %
Basophils Relative: 0 %
Eosinophils Absolute: 0 10*3/uL (ref 0.0–0.5)
Eosinophils Absolute: 0 10*3/uL (ref 0.0–0.5)
Eosinophils Relative: 1 %
Eosinophils Relative: 0 %
HCT: 46.5 % (ref 39.0–52.0)
HCT: 44.4 % (ref 39.0–52.0)
Hemoglobin: 15.4 g/dL (ref 13.0–17.0)
Hemoglobin: 14.3 g/dL (ref 13.0–17.0)
Immature Granulocytes: 1 %
Immature Granulocytes: 1 %
Lymphocytes Relative: 5 %
Lymphocytes Relative: 6 %
Lymphs Abs: 0.3 10*3/uL — ABNORMAL LOW (ref 0.7–4.0)
Lymphs Abs: 0.5 10*3/uL — ABNORMAL LOW (ref 0.7–4.0)
MCH: 29.7 pg (ref 26.0–34.0)
MCH: 29.3 pg (ref 26.0–34.0)
MCHC: 33.1 g/dL (ref 30.0–36.0)
MCHC: 32.2 g/dL (ref 30.0–36.0)
MCV: 89.8 fL (ref 80.0–100.0)
MCV: 91 fL (ref 80.0–100.0)
Monocytes Absolute: 1.4 10*3/uL — ABNORMAL HIGH (ref 0.1–1.0)
Monocytes Absolute: 1.2 10*3/uL — ABNORMAL HIGH (ref 0.1–1.0)
Monocytes Relative: 21 %
Monocytes Relative: 14 %
Neutro Abs: 4.7 10*3/uL (ref 1.7–7.7)
Neutro Abs: 7.1 10*3/uL (ref 1.7–7.7)
Neutrophils Relative %: 71 %
Neutrophils Relative %: 79 %
Platelets: 135 10*3/uL — ABNORMAL LOW (ref 150–400)
Platelets: 121 10*3/uL — ABNORMAL LOW (ref 150–400)
RBC: 5.18 MIL/uL (ref 4.22–5.81)
RBC: 4.88 MIL/uL (ref 4.22–5.81)
RDW: 14.6 % (ref 11.5–15.5)
RDW: 14.6 % (ref 11.5–15.5)
WBC: 6.5 10*3/uL (ref 4.0–10.5)
WBC: 8.9 10*3/uL (ref 4.0–10.5)
nRBC: 0 % (ref 0.0–0.2)
nRBC: 0 % (ref 0.0–0.2)

## 2021-09-30 LAB — POTASSIUM
Potassium: 6.8 mmol/L (ref 3.5–5.1)
Potassium: 7.2 mmol/L (ref 3.5–5.1)

## 2021-09-30 LAB — TROPONIN I (HIGH SENSITIVITY)
Troponin I (High Sensitivity): 11 ng/L (ref <18)
Troponin I (High Sensitivity): 12 ng/L (ref <18)

## 2021-09-30 LAB — LIPASE, BLOOD: Lipase: 49 U/L (ref 11–51)

## 2021-09-30 LAB — CREATININE, URINE, RANDOM: Creatinine, Urine: 135 mg/dL

## 2021-09-30 LAB — APTT: aPTT: 33 seconds (ref 24–36)

## 2021-09-30 LAB — PHOSPHORUS: Phosphorus: 3.6 mg/dL (ref 2.5–4.6)

## 2021-09-30 LAB — MRSA NEXT GEN BY PCR, NASAL: MRSA by PCR Next Gen: NOT DETECTED

## 2021-09-30 LAB — LACTATE DEHYDROGENASE: LDH: 191 U/L (ref 98–192)

## 2021-09-30 LAB — DIGOXIN LEVEL: Digoxin Level: <0.8 ng/mL — ABNORMAL LOW (ref 0.8–2.0)

## 2021-09-30 LAB — SODIUM, URINE, RANDOM: Sodium, Ur: 13 mmol/L

## 2021-09-30 LAB — BRAIN NATRIURETIC PEPTIDE: B Natriuretic Peptide: 39.7 pg/mL (ref 0.0–100.0)

## 2021-09-30 LAB — PROTIME-INR
INR: 1.1 (ref 0.8–1.2)
Prothrombin Time: 14.5 seconds (ref 11.4–15.2)

## 2021-09-30 LAB — LACTIC ACID, PLASMA
Lactic Acid, Venous: 2.4 mmol/L (ref 0.5–1.9)
Lactic Acid, Venous: 2.4 mmol/L (ref 0.5–1.9)
Lactic Acid, Venous: 2.5 mmol/L (ref 0.5–1.9)

## 2021-09-30 LAB — MAGNESIUM: Magnesium: 2.4 mg/dL (ref 1.7–2.4)

## 2021-09-30 LAB — CK: Total CK: 68 U/L (ref 49–397)

## 2021-09-30 LAB — AMYLASE: Amylase: 44 U/L (ref 28–100)

## 2021-09-30 SURGERY — LAPAROTOMY, EXPLORATORY
Anesthesia: General | Site: Abdomen

## 2021-09-30 MED ORDER — SUCCINYLCHOLINE CHLORIDE 200 MG/10ML IV SOSY
PREFILLED_SYRINGE | INTRAVENOUS | Status: AC
  Filled 2021-09-30: qty 10

## 2021-09-30 MED ORDER — ONDANSETRON 4 MG PO TBDP: 4.0000 mg | ORAL_TABLET | Freq: Four times a day (QID) | ORAL | Status: DC | PRN

## 2021-09-30 MED ORDER — DOCUSATE SODIUM 50 MG/5ML PO LIQD
100.0000 mg | Freq: Two times a day (BID) | ORAL | Status: DC
  Administered 2021-09-30: 100 mg
  Filled 2021-09-30: qty 10

## 2021-09-30 MED ORDER — DEXMEDETOMIDINE HCL IN NACL 80 MCG/20ML IV SOLN
INTRAVENOUS | Status: AC
Start: 2021-09-30 — End: ?
  Filled 2021-09-30: qty 20

## 2021-09-30 MED ORDER — CALCIUM GLUCONATE-NACL 1-0.675 GM/50ML-% IV SOLN
1.0000 g | Freq: Once | INTRAVENOUS | Status: AC
  Administered 2021-09-30: 1000 mg via INTRAVENOUS
  Filled 2021-09-30 (×2): qty 50

## 2021-09-30 MED ORDER — ALBUTEROL SULFATE (2.5 MG/3ML) 0.083% IN NEBU
2.5000 mg | INHALATION_SOLUTION | Freq: Three times a day (TID) | RESPIRATORY_TRACT | Status: DC
Start: 2021-09-30 — End: 2021-10-02
  Administered 2021-09-30 – 2021-10-02 (×5): 2.5 mg via RESPIRATORY_TRACT
  Filled 2021-09-30 (×5): qty 3

## 2021-09-30 MED ORDER — SODIUM BICARBONATE 8.4 % IV SOLN
50.0000 meq | Freq: Once | INTRAVENOUS | Status: AC
  Administered 2021-09-30: 50 meq via INTRAVENOUS
  Filled 2021-09-30: qty 50

## 2021-09-30 MED ORDER — PANTOPRAZOLE SODIUM 40 MG IV SOLR
40.0000 mg | Freq: Every day | INTRAVENOUS | Status: DC
Start: 2021-09-30 — End: 2021-09-30

## 2021-09-30 MED ORDER — PROPOFOL 1000 MG/100ML IV EMUL
5.0000 ug/kg/min | INTRAVENOUS | Status: DC
  Administered 2021-09-30: 30 ug/kg/min via INTRAVENOUS
  Administered 2021-09-30: 15 ug/kg/min via INTRAVENOUS
  Administered 2021-10-01: 20 ug/kg/min via INTRAVENOUS
  Filled 2021-09-30 (×3): qty 100

## 2021-09-30 MED ORDER — INSULIN REGULAR(HUMAN) IN NACL 100-0.9 UT/100ML-% IV SOLN
INTRAVENOUS | Status: DC
  Administered 2021-09-30: 5 [IU]/h via INTRAVENOUS
  Filled 2021-09-30: qty 100

## 2021-09-30 MED ORDER — SODIUM ZIRCONIUM CYCLOSILICATE 5 G PO PACK
10.0000 g | PACK | Freq: Once | ORAL | Status: AC
  Administered 2021-09-30: 10 g
  Filled 2021-09-30: qty 2

## 2021-09-30 MED ORDER — HYDROMORPHONE HCL 1 MG/ML IJ SOLN: 1.0000 mg | INTRAMUSCULAR | Status: DC | PRN

## 2021-09-30 MED ORDER — SODIUM CHLORIDE 0.9 % IV BOLUS
500.0000 mL | Freq: Once | INTRAVENOUS | Status: AC
  Administered 2021-09-30: 500 mL via INTRAVENOUS

## 2021-09-30 MED ORDER — PHENYLEPHRINE HCL-NACL 20-0.9 MG/250ML-% IV SOLN
25.0000 ug/min | INTRAVENOUS | Status: DC
  Administered 2021-09-30: 10 ug/min via INTRAVENOUS
  Administered 2021-09-30: 15 ug/min via INTRAVENOUS

## 2021-09-30 MED ORDER — SODIUM CHLORIDE 0.9 % IV SOLN
250.0000 mL | INTRAVENOUS | Status: DC
  Administered 2021-10-07 – 2021-10-11 (×4): 250 mL via INTRAVENOUS

## 2021-09-30 MED ORDER — LACTATED RINGERS IV SOLN: INTRAVENOUS | Status: DC | PRN

## 2021-09-30 MED ORDER — ONDANSETRON HCL 4 MG/2ML IJ SOLN: 4.0000 mg | Freq: Four times a day (QID) | INTRAMUSCULAR | Status: DC | PRN

## 2021-09-30 MED ORDER — INSULIN DETEMIR 100 UNIT/ML ~~LOC~~ SOLN
24.0000 [IU] | Freq: Every day | SUBCUTANEOUS | Status: DC
Start: 2021-10-01 — End: 2021-10-01

## 2021-09-30 MED ORDER — PROPOFOL 1000 MG/100ML IV EMUL
INTRAVENOUS | Status: AC
Start: 2021-09-30 — End: ?
  Filled 2021-09-30: qty 100

## 2021-09-30 MED ORDER — FUROSEMIDE 10 MG/ML IJ SOLN
40.0000 mg | Freq: Once | INTRAMUSCULAR | Status: AC
Start: 2021-09-30 — End: 2021-09-30
  Administered 2021-09-30: 40 mg via INTRAVENOUS
  Filled 2021-09-30: qty 4

## 2021-09-30 MED ORDER — ALBUTEROL SULFATE (2.5 MG/3ML) 0.083% IN NEBU
5.0000 mg | INHALATION_SOLUTION | Freq: Once | RESPIRATORY_TRACT | Status: AC
  Administered 2021-09-30: 5 mg via RESPIRATORY_TRACT
  Filled 2021-09-30 (×2): qty 6

## 2021-09-30 MED ORDER — ACETAMINOPHEN 10 MG/ML IV SOLN
INTRAVENOUS | Status: DC | PRN
  Administered 2021-09-30: 1000 mg via INTRAVENOUS

## 2021-09-30 MED ORDER — INSULIN ASPART 100 UNIT/ML IV SOLN
10.0000 [IU] | Freq: Once | INTRAVENOUS | Status: AC
Start: 2021-09-30 — End: 2021-09-30
  Administered 2021-09-30: 10 [IU] via INTRAVENOUS
  Filled 2021-09-30: qty 0.1

## 2021-09-30 MED ORDER — PROPOFOL 10 MG/ML IV BOLUS
INTRAVENOUS | Status: AC
  Filled 2021-09-30: qty 20

## 2021-09-30 MED ORDER — FENTANYL CITRATE (PF) 100 MCG/2ML IJ SOLN
INTRAMUSCULAR | Status: AC
  Filled 2021-09-30: qty 2

## 2021-09-30 MED ORDER — DEXTROSE 50 % IV SOLN: 0.0000 mL | INTRAVENOUS | Status: DC | PRN

## 2021-09-30 MED ORDER — HYDROMORPHONE HCL 1 MG/ML IJ SOLN
0.5000 mg | Freq: Once | INTRAMUSCULAR | Status: AC
  Administered 2021-09-30: 0.5 mg via INTRAVENOUS
  Filled 2021-09-30: qty 0.5

## 2021-09-30 MED ORDER — CALCIUM GLUCONATE-NACL 1-0.675 GM/50ML-% IV SOLN
1.0000 g | Freq: Once | INTRAVENOUS | Status: AC
  Administered 2021-09-30: 1000 mg via INTRAVENOUS
  Filled 2021-09-30: qty 50

## 2021-09-30 MED ORDER — DOCUSATE SODIUM 100 MG PO CAPS
100.0000 mg | ORAL_CAPSULE | Freq: Two times a day (BID) | ORAL | Status: DC | PRN
Start: 2021-09-30 — End: 2021-10-18

## 2021-09-30 MED ORDER — PHENYLEPHRINE 80 MCG/ML (10ML) SYRINGE FOR IV PUSH (FOR BLOOD PRESSURE SUPPORT)
PREFILLED_SYRINGE | INTRAVENOUS | Status: AC
  Filled 2021-09-30: qty 10

## 2021-09-30 MED ORDER — INSULIN ASPART 100 UNIT/ML IV SOLN
10.0000 [IU] | Freq: Once | INTRAVENOUS | Status: AC
  Administered 2021-09-30: 10 [IU] via INTRAVENOUS
  Filled 2021-09-30: qty 0.1

## 2021-09-30 MED ORDER — HYDROMORPHONE HCL 1 MG/ML IJ SOLN
1.0000 mg | INTRAMUSCULAR | Status: DC
  Filled 2021-09-30: qty 1

## 2021-09-30 MED ORDER — ROCURONIUM BROMIDE 100 MG/10ML IV SOLN
INTRAVENOUS | Status: DC | PRN
  Administered 2021-09-30: 20 mg via INTRAVENOUS
  Administered 2021-09-30: 50 mg via INTRAVENOUS
  Administered 2021-09-30: 10 mg via INTRAVENOUS
  Administered 2021-09-30: 30 mg via INTRAVENOUS
  Administered 2021-09-30: 20 mg via INTRAVENOUS
  Administered 2021-09-30: 10 mg via INTRAVENOUS
  Administered 2021-09-30: 30 mg via INTRAVENOUS
  Administered 2021-09-30: 10 mg via INTRAVENOUS

## 2021-09-30 MED ORDER — ALBUTEROL SULFATE (2.5 MG/3ML) 0.083% IN NEBU
20.0000 mg | INHALATION_SOLUTION | Freq: Once | RESPIRATORY_TRACT | Status: AC
  Administered 2021-09-30: 20 mg via RESPIRATORY_TRACT
  Filled 2021-09-30 (×2): qty 24

## 2021-09-30 MED ORDER — CHLORHEXIDINE GLUCONATE CLOTH 2 % EX PADS
6.0000 | MEDICATED_PAD | Freq: Every day | CUTANEOUS | Status: DC
Start: 2021-10-01 — End: 2021-10-09
  Administered 2021-10-02 – 2021-10-08 (×6): 6 via TOPICAL

## 2021-09-30 MED ORDER — FENTANYL CITRATE PF 50 MCG/ML IJ SOSY: 25.0000 ug | PREFILLED_SYRINGE | INTRAMUSCULAR | Status: DC | PRN

## 2021-09-30 MED ORDER — SODIUM CHLORIDE 0.9 % IV SOLN: INTRAVENOUS | Status: DC | PRN

## 2021-09-30 MED ORDER — PHENYLEPHRINE 80 MCG/ML (10ML) SYRINGE FOR IV PUSH (FOR BLOOD PRESSURE SUPPORT)
PREFILLED_SYRINGE | INTRAVENOUS | Status: DC | PRN
  Administered 2021-09-30 (×2): 160 ug via INTRAVENOUS
  Administered 2021-09-30: 80 ug via INTRAVENOUS

## 2021-09-30 MED ORDER — LACTATED RINGERS IV BOLUS
1000.0000 mL | Freq: Once | INTRAVENOUS | Status: AC
  Administered 2021-09-30: 1000 mL via INTRAVENOUS

## 2021-09-30 MED ORDER — IOHEXOL 300 MG/ML  SOLN
100.0000 mL | Freq: Once | INTRAMUSCULAR | Status: AC | PRN
  Administered 2021-09-30: 80 mL via INTRAVENOUS

## 2021-09-30 MED ORDER — ACETAMINOPHEN 10 MG/ML IV SOLN
INTRAVENOUS | Status: AC
  Filled 2021-09-30: qty 100

## 2021-09-30 MED ORDER — 0.9 % SODIUM CHLORIDE (POUR BTL) OPTIME
TOPICAL | Status: DC | PRN
  Administered 2021-09-30: 500 mL

## 2021-09-30 MED ORDER — KETAMINE HCL 10 MG/ML IJ SOLN
INTRAMUSCULAR | Status: DC | PRN
  Administered 2021-09-30 (×2): 15 mg via INTRAVENOUS

## 2021-09-30 MED ORDER — ORAL CARE MOUTH RINSE
15.0000 mL | OROMUCOSAL | Status: DC
  Administered 2021-09-30 – 2021-10-02 (×16): 15 mL via OROMUCOSAL

## 2021-09-30 MED ORDER — DEXTROSE 50 % IV SOLN
1.0000 | Freq: Once | INTRAVENOUS | Status: AC
  Administered 2021-09-30: 50 mL via INTRAVENOUS
  Filled 2021-09-30: qty 50

## 2021-09-30 MED ORDER — LIDOCAINE HCL (CARDIAC) PF 100 MG/5ML IV SOSY
PREFILLED_SYRINGE | INTRAVENOUS | Status: DC | PRN
  Administered 2021-09-30: 100 mg via INTRAVENOUS

## 2021-09-30 MED ORDER — ONDANSETRON HCL 4 MG/2ML IJ SOLN
4.0000 mg | Freq: Once | INTRAMUSCULAR | Status: AC
  Administered 2021-09-30: 4 mg via INTRAVENOUS
  Filled 2021-09-30: qty 2

## 2021-09-30 MED ORDER — SUCCINYLCHOLINE CHLORIDE 200 MG/10ML IV SOSY
PREFILLED_SYRINGE | INTRAVENOUS | Status: DC | PRN
  Administered 2021-09-30: 100 mg via INTRAVENOUS

## 2021-09-30 MED ORDER — ROCURONIUM BROMIDE 10 MG/ML (PF) SYRINGE
PREFILLED_SYRINGE | INTRAVENOUS | Status: AC
  Filled 2021-09-30: qty 10

## 2021-09-30 MED ORDER — PIPERACILLIN-TAZOBACTAM 3.375 G IVPB 30 MIN
3.3750 g | Freq: Once | INTRAVENOUS | Status: AC
  Administered 2021-09-30: 3.375 g via INTRAVENOUS

## 2021-09-30 MED ORDER — PHENYLEPHRINE HCL-NACL 20-0.9 MG/250ML-% IV SOLN
INTRAVENOUS | Status: DC | PRN
  Administered 2021-09-30: 50 ug/min via INTRAVENOUS

## 2021-09-30 MED ORDER — PROPOFOL 10 MG/ML IV BOLUS
INTRAVENOUS | Status: DC | PRN
  Administered 2021-09-30: 20 ug/kg/min via INTRAVENOUS
  Administered 2021-09-30: 150 mg via INTRAVENOUS

## 2021-09-30 MED ORDER — FENTANYL CITRATE PF 50 MCG/ML IJ SOSY
50.0000 ug | PREFILLED_SYRINGE | Freq: Once | INTRAMUSCULAR | Status: AC
  Administered 2021-09-30: 50 ug via INTRAVENOUS
  Filled 2021-09-30: qty 1

## 2021-09-30 MED ORDER — GLYCOPYRROLATE 0.2 MG/ML IJ SOLN
INTRAMUSCULAR | Status: DC | PRN
  Administered 2021-09-30: .2 mg via INTRAVENOUS

## 2021-09-30 MED ORDER — INSULIN ASPART 100 UNIT/ML IJ SOLN
0.0000 [IU] | INTRAMUSCULAR | Status: DC
  Administered 2021-09-30: 5 [IU] via SUBCUTANEOUS
  Filled 2021-09-30: qty 1

## 2021-09-30 MED ORDER — FENTANYL CITRATE (PF) 100 MCG/2ML IJ SOLN
INTRAMUSCULAR | Status: DC | PRN
  Administered 2021-09-30: 50 ug via INTRAVENOUS
  Administered 2021-09-30 (×2): 25 ug via INTRAVENOUS

## 2021-09-30 MED ORDER — VASOPRESSIN 20 UNIT/ML IV SOLN
INTRAVENOUS | Status: DC | PRN
  Administered 2021-09-30: 1 [IU] via INTRAVENOUS
  Administered 2021-09-30: 2 [IU] via INTRAVENOUS
  Administered 2021-09-30 (×5): 1 [IU] via INTRAVENOUS

## 2021-09-30 MED ORDER — PIPERACILLIN-TAZOBACTAM 3.375 G IVPB
3.3750 g | Freq: Three times a day (TID) | INTRAVENOUS | Status: AC
  Administered 2021-09-30 – 2021-10-09 (×27): 3.375 g via INTRAVENOUS
  Filled 2021-09-30 (×28): qty 50

## 2021-09-30 MED ORDER — DEXAMETHASONE SODIUM PHOSPHATE 10 MG/ML IJ SOLN
INTRAMUSCULAR | Status: DC | PRN
  Administered 2021-09-30: 5 mg via INTRAVENOUS

## 2021-09-30 MED ORDER — LIDOCAINE HCL (PF) 2 % IJ SOLN
INTRAMUSCULAR | Status: AC
  Filled 2021-09-30: qty 5

## 2021-09-30 MED ORDER — MIDAZOLAM HCL 2 MG/2ML IJ SOLN
INTRAMUSCULAR | Status: AC
  Filled 2021-09-30: qty 2

## 2021-09-30 MED ORDER — SODIUM CHLORIDE 3 % IN NEBU
4.0000 mL | INHALATION_SOLUTION | Freq: Three times a day (TID) | RESPIRATORY_TRACT | Status: DC
Start: 2021-09-30 — End: 2021-10-01
  Administered 2021-09-30: 4 mL via RESPIRATORY_TRACT
  Filled 2021-09-30 (×4): qty 4

## 2021-09-30 MED ORDER — SODIUM ZIRCONIUM CYCLOSILICATE 5 G PO PACK
10.0000 g | PACK | Freq: Two times a day (BID) | ORAL | Status: DC
  Administered 2021-09-30 – 2021-10-02 (×3): 10 g
  Filled 2021-09-30 (×3): qty 2

## 2021-09-30 MED ORDER — PHENYLEPHRINE HCL-NACL 20-0.9 MG/250ML-% IV SOLN: 0.0000 ug/min | INTRAVENOUS | Status: DC

## 2021-09-30 MED ORDER — POLYETHYLENE GLYCOL 3350 17 G PO PACK
17.0000 g | PACK | Freq: Every day | ORAL | Status: DC
  Administered 2021-09-30: 17 g
  Filled 2021-09-30: qty 1

## 2021-09-30 MED ORDER — GLYCOPYRROLATE 0.2 MG/ML IJ SOLN
INTRAMUSCULAR | Status: AC
  Filled 2021-09-30: qty 1

## 2021-09-30 MED ORDER — HEPARIN SODIUM (PORCINE) 5000 UNIT/ML IJ SOLN
5000.0000 [IU] | Freq: Three times a day (TID) | INTRAMUSCULAR | Status: DC
  Administered 2021-09-30 – 2021-10-06 (×16): 5000 [IU] via SUBCUTANEOUS
  Filled 2021-09-30 (×16): qty 1

## 2021-09-30 MED ORDER — ONDANSETRON HCL 4 MG/2ML IJ SOLN
INTRAMUSCULAR | Status: DC | PRN
  Administered 2021-09-30: 4 mg via INTRAVENOUS

## 2021-09-30 MED ORDER — ORAL CARE MOUTH RINSE: 15.0000 mL | OROMUCOSAL | Status: DC | PRN

## 2021-09-30 MED ORDER — PANTOPRAZOLE 2 MG/ML SUSPENSION
40.0000 mg | Freq: Every day | ORAL | Status: DC
  Administered 2021-09-30: 40 mg
  Filled 2021-09-30: qty 20

## 2021-09-30 MED ORDER — DEXMEDETOMIDINE (PRECEDEX) IN NS 20 MCG/5ML (4 MCG/ML) IV SYRINGE
PREFILLED_SYRINGE | INTRAVENOUS | Status: DC | PRN
  Administered 2021-09-30 (×5): 4 ug via INTRAVENOUS

## 2021-09-30 MED ORDER — POLYETHYLENE GLYCOL 3350 17 G PO PACK: 17.0000 g | PACK | Freq: Every day | ORAL | Status: DC | PRN

## 2021-09-30 MED ORDER — SODIUM CHLORIDE 0.9 % IV SOLN: INTRAVENOUS | Status: DC

## 2021-09-30 MED ORDER — KETAMINE HCL 50 MG/5ML IJ SOSY
PREFILLED_SYRINGE | INTRAMUSCULAR | Status: AC
  Filled 2021-09-30: qty 5

## 2021-09-30 SURGICAL SUPPLY — 44 items
APL PRP STRL LF DISP 70% ISPRP (MISCELLANEOUS) ×1
APPLIER CLIP 11 MED OPEN (CLIP)
APPLIER CLIP 13 LRG OPEN (CLIP)
APR CLP LRG 13 20 CLIP (CLIP)
APR CLP MED 11 20 MLT OPN (CLIP)
BARRIER ADH SEPRAFILM 3INX5IN (MISCELLANEOUS) ×1 IMPLANT
BINDER ABDOMINAL 12 ML 46-62 (SOFTGOODS) ×1 IMPLANT
BLADE CLIPPER SURG (BLADE) IMPLANT
BRR ADH 5X3 SEPRAFILM 2 SHT (MISCELLANEOUS) ×1
CHLORAPREP W/TINT 26 (MISCELLANEOUS) ×2 IMPLANT
CLIP APPLIE 11 MED OPEN (CLIP) IMPLANT
CLIP APPLIE 13 LRG OPEN (CLIP) IMPLANT
COVER BACK TABLE REUSABLE LG (DRAPES) ×2 IMPLANT
DRAPE C-SECTION (MISCELLANEOUS) ×1 IMPLANT
DRAPE LAPAROTOMY 100X77 ABD (DRAPES) ×2 IMPLANT
ELECT BLADE 6.5 EXT (BLADE) ×2 IMPLANT
ELECT CAUTERY BLADE 6.4 (BLADE) ×2 IMPLANT
ELECT REM PT RETURN 9FT ADLT (ELECTROSURGICAL) ×2
ELECTRODE REM PT RTRN 9FT ADLT (ELECTROSURGICAL) ×1 IMPLANT
GAUZE 4X4 16PLY ~~LOC~~+RFID DBL (SPONGE) ×2 IMPLANT
GLOVE ORTHO TXT STRL SZ7.5 (GLOVE) ×4 IMPLANT
GOWN STRL REUS W/ TWL LRG LVL3 (GOWN DISPOSABLE) ×2 IMPLANT
GOWN STRL REUS W/ TWL XL LVL3 (GOWN DISPOSABLE) ×2 IMPLANT
GOWN STRL REUS W/TWL LRG LVL3 (GOWN DISPOSABLE) ×4
GOWN STRL REUS W/TWL XL LVL3 (GOWN DISPOSABLE) ×4
HANDLE YANKAUER SUCT BULB TIP (MISCELLANEOUS) IMPLANT
LIGASURE IMPACT 36 18CM CVD LR (INSTRUMENTS) IMPLANT
MANIFOLD NEPTUNE II (INSTRUMENTS) ×2 IMPLANT
NS IRRIG 1000ML POUR BTL (IV SOLUTION) ×2 IMPLANT
OPSITE POST-OP VISIBLE ×1 IMPLANT
PACK BASIN MAJOR ARMC (MISCELLANEOUS) ×2 IMPLANT
PACK COLON CLEAN CLOSURE (MISCELLANEOUS) IMPLANT
SPONGE T-LAP 18X18 ~~LOC~~+RFID (SPONGE) ×8 IMPLANT
STAPLER SKIN PROX 35W (STAPLE) ×2 IMPLANT
SUT PDS AB 1 CT  36 (SUTURE) ×6
SUT PDS AB 1 CT 36 (SUTURE) ×3 IMPLANT
SUT PROLENE 0 CT 2 (SUTURE) ×1 IMPLANT
SUT SILK 3-0 (SUTURE) ×2 IMPLANT
SUT VIC AB 3-0 SH 27 (SUTURE) ×2
SUT VIC AB 3-0 SH 27X BRD (SUTURE) IMPLANT
SUT VICRYL 2-0 54IN ABS (SUTURE) IMPLANT
TRAP FLUID SMOKE EVACUATOR (MISCELLANEOUS) ×2 IMPLANT
TRAY FOLEY MTR SLVR 16FR STAT (SET/KITS/TRAYS/PACK) ×2 IMPLANT
WATER STERILE IRR 500ML POUR (IV SOLUTION) ×2 IMPLANT

## 2021-09-30 NOTE — Transfer of Care (Signed)
Immediate Anesthesia Transfer of Care Note  Patient: Todd Spencer  Procedure(s) Performed: EXPLORATORY LAPAROTOMY (Abdomen) LYSIS OF ADHESION (Abdomen)  Patient Location: PACU and ICU  Anesthesia Type:General  Level of Consciousness: Patient remains intubated per anesthesia plan  Airway & Oxygen Therapy: Patient remains intubated per anesthesia plan and Patient placed on Ventilator (see vital sign flow sheet for setting)  Post-op Assessment: Report given to RN and Post -op Vital signs reviewed and stable  Post vital signs: Reviewed and stable  Last Vitals:  Vitals Value Taken Time  BP 114/62 10/15/2021 1548  Temp 36.1   Pulse 82 10/08/2021 1556  Resp 19 10/06/2021 1556  SpO2 94 % 10/17/2021 1556  Vitals shown include unvalidated device data.  Last Pain:  Vitals:   10/12/2021 1544  TempSrc: Axillary  PainSc:          Complications: No notable events documented.
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
## 2021-09-30 NOTE — Progress Notes (Signed)
Pharmacy Antibiotic Note  Todd Spencer is a 71 y.o. male admitted on 10/02/2021 with  IAI .  Pharmacy has been consulted for Zosyn dosing.  Plan: Patient received zosyn 3/375gm IV x 1 in ED Will continue with Zosyn 3.375gm EI IV q8h    Weight: 110 kg (242 lb 8.1 oz)  Temp (24hrs), Avg:98.3 F (36.8 C), Min:98.1 F (36.7 C), Max:98.5 F (36.9 C)  Recent Labs  Lab 09/19/2021 0858  WBC 6.5  CREATININE 1.85*  LATICACIDVEN 2.4*    Estimated Creatinine Clearance: 45.5 mL/min (A) (by C-G formula based on SCr of 1.85 mg/dL (H)).    No Known Allergies  Antimicrobials this admission: zosyn 8/12 >>       >>    Dose adjustments this admission:    Microbiology results:   BCx:       UCx:      Sputum:    8/12 MRSA PCR: pend  Thank you for allowing pharmacy to be a part of this patient's care.  Taegen Delker A 10/09/2021 4:11 PM

## 2021-09-30 NOTE — Progress Notes (Signed)
An USGPIV (ultrasound guided PIV) 20g L forearm has been placed for short-term vasopressor infusion. A correctly placed ivWatch must be used when administering Vasopressors. Should this treatment be needed beyond 72 hours, central line access should be obtained.  It will be the responsibility of the bedside nurse to follow best practice to prevent extravasations.

## 2021-09-30 NOTE — Progress Notes (Signed)
Pharmacy Electrolyte Monitoring Consult:  Pharmacy consulted to assist in monitoring and replacing electrolytes in this 71 y.o. male admitted on 10/11/2021 with Constipation   Labs:  Sodium (mmol/L)  Date Value  10/08/2021 136  09/12/2011 139   Potassium (mmol/L)  Date Value  10/17/2021 >7.5 (HH)  09/12/2011 4.5   Magnesium (mg/dL)  Date Value  04/02/2019 2.3   Phosphorus (mg/dL)  Date Value  04/02/2019 2.3 (L)   Calcium (mg/dL)  Date Value  10/11/2021 8.1 (L)   Calcium, Total (mg/dL)  Date Value  09/12/2011 8.6   Albumin (g/dL)  Date Value  10/09/2021 3.4 (L)    Assessment/Plan: K >7.5 Orders for lokelma bid and meds for hyperkalemia (calcium gluconate, IV insulin etc) K q4h F/u with am labs  Abdulla Pooley A 09/24/2021 7:02 PM
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
## 2021-09-30 NOTE — Op Note (Signed)
Exploratory laparotomy with lysis of adhesions.  Pre-operative Diagnosis: Acute mesenteric ischemia. Pneumoperitoneum.   Post-operative Diagnosis: Possible acute onset small bowel obstruction secondary to adhesions.  Abnormal CT findings as prompting the preoperative diagnoses.  Surgeon: Ronny Bacon, M.D., FACS  Anesthesia: General  Findings: Non incorporated inferior midline onlay mesh.  Upper abdominal retro-rectus mesh well incorporated.  No compromised small bowel, some evidence of chronic transitional point, without specific adhesive process identified.  Extensive lysis of adhesions completed normal-appearing small bowel from the ligament of Treitz to the ileocecal valve.  No significant peritoneal fluid, no acute or abnormal serosal changes.  No pneumatosis appreciated.  Estimated Blood Loss: 50 mL         Specimens: None          Complications: none              Procedure Details  The patient was seen again in the Holding Room. The benefits, complications, treatment options, and expected outcomes were discussed with the patient. The risks of bleeding, infection, recurrence of symptoms, failure to resolve symptoms, unanticipated injury, prosthetic placement, prosthetic infection, any of which could require further surgery were reviewed with the patient. The likelihood of improving the patient's symptoms with return to their baseline status is guarded.  The patient and/or family concurred with the proposed plan, giving informed consent.  The patient was taken to Operating Room, identified and the procedure verified.    Prior to the induction of general anesthesia, antibiotic prophylaxis was administered. VTE prophylaxis was in place.  General anesthesia was then administered and tolerated well. After the induction, the patient was positioned in the supine position and the abdomen was prepped with  Chloraprep and draped in the sterile fashion.  A Time Out was held and the above  information confirmed.  A midline incision when was carried through subcutaneous tissues, we entered the peritoneum in the epigastric region, through careful sharp technique.  We then extended the incision to the infraumbilical region, carefully dissecting the omental tissues from the intraperitoneal location of the midline.  Then proceeded with mobilizing the adhesions from the anterior abdominal wall to the left side most of these resulted and devascularizing small pedicle of omentum.  This was then excised and discarded.  Then proceeded with lysis of the small bowel interloop adhesions, initially proceeding toward the ligament of Treitz, and then running the small bowel distally to the ileocecal valve.  There is a mild noncompromised transition area without evidence of particular scarring or band adhesion.  The small bowel was remarkably pristine without evidence of discoloration, pneumatosis or perforation. The peritoneal fluid was as normal as could be. I then visually evaluated the entirety of the colon.  There was no compromise of the colon anywhere, there is no malodor, there is no abnormal peritoneal fluid present.  The upper abdomen was somewhat inaccessible secondary to adhesive process, was not able to palpate the stomach without undue and unnecessary lysis of adhesions. The right upper quadrant was also free of any significant acute disease process. Once was completed, we proceeded with closure of the anterior abdominal wall.  I utilized a 0 Prolene, reapproximate the the midline and maintaining the onlay mesh, it was secured at the perimeter but not adherent to the underlying fascia at the midline.  As I closed the midline I placed 3 x 5 sheets of Seprafilm against the anterior abdominal wall. There was a small seroma of the inferior aspect of the incision where the non- incorporated onlay  mesh was present.  Utilized a 3-0 Vicryl to reapproximate the subcu and closed the seroma process.  Skin  was reapproximated with staples.  Honeycomb dressing applied. Overall the patient appeared to tolerate the procedure well.  He will remain intubated and transferred to the ICU for gradual extubation and further observation.  I believe this is prudent considering the CT findings. Needle sponge and estimate count reported correct.     Ronny Bacon M.D., [facility] 09/19/2021 3:41 PM

## 2021-09-30 NOTE — Progress Notes (Incomplete)
Suspected GIB in the setting of recent ex-lap with lysis of adhesions Hyperkalemia Called bedside by nursing to evaluate OGT output which appears bloody. 450 mL in 3 hours, hemodynamics remain mostly stable. - general surgery paged for recommendations, appreciate input - CBC & INR STAT, consider CTa if Hgb has dropped - consider Protonix bolus followed by BID dosing depending on above interventions - repeat shifting measures, f/u K+ and lactic, consider kayexalate vs dialysis - EKG    Additional CC time 30 minutes  Venetia Night, AGACNP-BC Acute Care Nurse Practitioner [facility]   [phone] / [phone] Please see Amion for pager details.

## 2021-09-30 NOTE — ED Notes (Signed)
Recommended pt use bed pan instead of toilet , pt refused and wanted to walk ed rn assisted pt to toilet and verbally told patient to use call light and not try to walk to bed without assistance.

## 2021-09-30 NOTE — Progress Notes (Signed)
Pharmacy Electrolyte Monitoring Consult:  Pharmacy consulted to assist in monitoring and replacing electrolytes in this 71 y.o. male admitted on 09/29/2021 with Constipation   Labs:  Sodium (mmol/L)  Date Value  10/05/2021 141  09/12/2011 139   Potassium (mmol/L)  Date Value  09/23/2021 4.8  09/12/2011 4.5   Magnesium (mg/dL)  Date Value  04/02/2019 2.3   Phosphorus (mg/dL)  Date Value  04/02/2019 2.3 (L)   Calcium (mg/dL)  Date Value  10/17/2021 10.3   Calcium, Total (mg/dL)  Date Value  09/12/2011 8.6   Albumin (g/dL)  Date Value  10/09/2021 4.5    Assessment/Plan: Electrolytes WNL No replacement at this time F/u with next labs  Dayshon Roback A 09/25/2021 4:08 PM
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
## 2021-09-30 NOTE — Anesthesia Procedure Notes (Signed)
Procedure Name: Intubation Date/Time: 09/28/2021 12:57 PM  Performed by: Lily Peer, Elika Godar, CRNAPre-anesthesia Checklist: Patient identified, Emergency Drugs available, Suction available and Patient being monitored Patient Re-evaluated:Patient Re-evaluated prior to induction Oxygen Delivery Method: Circle system utilized Preoxygenation: Pre-oxygenation with 100% oxygen Induction Type: IV induction, Rapid sequence and Cricoid Pressure applied Tube type: Oral Tube size: 7.5 mm Number of attempts: 1 Airway Equipment and Method: Stylet Placement Confirmation: ETT inserted through vocal cords under direct vision, positive ETCO2 and breath sounds checked- equal and bilateral Secured at: 22 cm Tube secured with: Tape Dental Injury: Teeth and Oropharynx as per pre-operative assessment

## 2021-10-01 ENCOUNTER — Inpatient Hospital Stay: Payer: Medicare HMO

## 2021-10-01 DIAGNOSIS — E875 Hyperkalemia: Secondary | ICD-10-CM

## 2021-10-01 LAB — GLUCOSE, CAPILLARY
Glucose-Capillary: 113 mg/dL — ABNORMAL HIGH (ref 70–99)
Glucose-Capillary: 115 mg/dL — ABNORMAL HIGH (ref 70–99)
Glucose-Capillary: 118 mg/dL — ABNORMAL HIGH (ref 70–99)
Glucose-Capillary: 139 mg/dL — ABNORMAL HIGH (ref 70–99)
Glucose-Capillary: 147 mg/dL — ABNORMAL HIGH (ref 70–99)
Glucose-Capillary: 158 mg/dL — ABNORMAL HIGH (ref 70–99)
Glucose-Capillary: 159 mg/dL — ABNORMAL HIGH (ref 70–99)
Glucose-Capillary: 164 mg/dL — ABNORMAL HIGH (ref 70–99)
Glucose-Capillary: 184 mg/dL — ABNORMAL HIGH (ref 70–99)
Glucose-Capillary: 201 mg/dL — ABNORMAL HIGH (ref 70–99)
Glucose-Capillary: 202 mg/dL — ABNORMAL HIGH (ref 70–99)
Glucose-Capillary: 216 mg/dL — ABNORMAL HIGH (ref 70–99)
Glucose-Capillary: 248 mg/dL — ABNORMAL HIGH (ref 70–99)
Glucose-Capillary: 262 mg/dL — ABNORMAL HIGH (ref 70–99)
Glucose-Capillary: 265 mg/dL — ABNORMAL HIGH (ref 70–99)
Glucose-Capillary: 272 mg/dL — ABNORMAL HIGH (ref 70–99)
Glucose-Capillary: 281 mg/dL — ABNORMAL HIGH (ref 70–99)

## 2021-10-01 LAB — BLOOD GAS, ARTERIAL
Acid-base deficit: 4.1 mmol/L — ABNORMAL HIGH (ref 0.0–2.0)
Acid-base deficit: 2 mmol/L (ref 0.0–2.0)
Bicarbonate: 23 mmol/L (ref 20.0–28.0)
Bicarbonate: 25.1 mmol/L (ref 20.0–28.0)
FIO2: 45 %
FIO2: 45 %
MECHVT: 550 mL
Mechanical Rate: 22
O2 Saturation: 98.7 %
O2 Saturation: 98.8 %
PEEP: 8 cmH2O
PEEP: 8 cmH2O
Patient temperature: 37
Patient temperature: 37
Pressure support: 5 cmH2O
pCO2 arterial: 49 mmHg — ABNORMAL HIGH (ref 32–48)
pCO2 arterial: 51 mmHg — ABNORMAL HIGH (ref 32–48)
pH, Arterial: 7.28 — ABNORMAL LOW (ref 7.35–7.45)
pH, Arterial: 7.3 — ABNORMAL LOW (ref 7.35–7.45)
pO2, Arterial: 99 mmHg (ref 83–108)
pO2, Arterial: 95 mmHg (ref 83–108)

## 2021-10-01 LAB — CBC WITH DIFFERENTIAL/PLATELET
Abs Immature Granulocytes: 0.07 10*3/uL (ref 0.00–0.07)
Basophils Absolute: 0.1 10*3/uL (ref 0.0–0.1)
Basophils Relative: 1 %
Eosinophils Absolute: 0 10*3/uL (ref 0.0–0.5)
Eosinophils Relative: 0 %
HCT: 45.1 % (ref 39.0–52.0)
Hemoglobin: 14.4 g/dL (ref 13.0–17.0)
Immature Granulocytes: 1 %
Lymphocytes Relative: 5 %
Lymphs Abs: 0.6 10*3/uL — ABNORMAL LOW (ref 0.7–4.0)
MCH: 29.6 pg (ref 26.0–34.0)
MCHC: 31.9 g/dL (ref 30.0–36.0)
MCV: 92.6 fL (ref 80.0–100.0)
Monocytes Absolute: 1.3 10*3/uL — ABNORMAL HIGH (ref 0.1–1.0)
Monocytes Relative: 10 %
Neutro Abs: 10.4 10*3/uL — ABNORMAL HIGH (ref 1.7–7.7)
Neutrophils Relative %: 83 %
Platelets: 151 10*3/uL (ref 150–400)
RBC: 4.87 MIL/uL (ref 4.22–5.81)
RDW: 15 % (ref 11.5–15.5)
WBC: 12.4 10*3/uL — ABNORMAL HIGH (ref 4.0–10.5)
nRBC: 0 % (ref 0.0–0.2)

## 2021-10-01 LAB — BASIC METABOLIC PANEL
Anion gap: 9 (ref 5–15)
Anion gap: 11 (ref 5–15)
BUN: 45 mg/dL — ABNORMAL HIGH (ref 8–23)
BUN: 51 mg/dL — ABNORMAL HIGH (ref 8–23)
CO2: 25 mmol/L (ref 22–32)
CO2: 22 mmol/L (ref 22–32)
Calcium: 8.1 mg/dL — ABNORMAL LOW (ref 8.9–10.3)
Calcium: 7.8 mg/dL — ABNORMAL LOW (ref 8.9–10.3)
Chloride: 105 mmol/L (ref 98–111)
Chloride: 105 mmol/L (ref 98–111)
Creatinine, Ser: 2.1 mg/dL — ABNORMAL HIGH (ref 0.61–1.24)
Creatinine, Ser: 2.32 mg/dL — ABNORMAL HIGH (ref 0.61–1.24)
GFR, Estimated: 33 mL/min — ABNORMAL LOW (ref >60)
GFR, Estimated: 29 mL/min — ABNORMAL LOW (ref >60)
Glucose, Bld: 324 mg/dL — ABNORMAL HIGH (ref 70–99)
Glucose, Bld: 269 mg/dL — ABNORMAL HIGH (ref 70–99)
Potassium: 6.1 mmol/L — ABNORMAL HIGH (ref 3.5–5.1)
Potassium: 5.3 mmol/L — ABNORMAL HIGH (ref 3.5–5.1)
Sodium: 139 mmol/L (ref 135–145)
Sodium: 138 mmol/L (ref 135–145)

## 2021-10-01 LAB — CBC
HCT: 44 % (ref 39.0–52.0)
Hemoglobin: 14.4 g/dL (ref 13.0–17.0)
MCH: 29.8 pg (ref 26.0–34.0)
MCHC: 32.7 g/dL (ref 30.0–36.0)
MCV: 91.1 fL (ref 80.0–100.0)
Platelets: 105 10*3/uL — ABNORMAL LOW (ref 150–400)
RBC: 4.83 MIL/uL (ref 4.22–5.81)
RDW: 14.8 % (ref 11.5–15.5)
WBC: 7.8 10*3/uL (ref 4.0–10.5)
nRBC: 0 % (ref 0.0–0.2)

## 2021-10-01 LAB — TRIGLYCERIDES: Triglycerides: 525 mg/dL — ABNORMAL HIGH (ref <150)

## 2021-10-01 LAB — TYPE AND SCREEN
ABO/RH(D): A POS
Antibody Screen: NEGATIVE

## 2021-10-01 LAB — LACTIC ACID, PLASMA
Lactic Acid, Venous: 3.7 mmol/L (ref 0.5–1.9)
Lactic Acid, Venous: 3.5 mmol/L (ref 0.5–1.9)

## 2021-10-01 LAB — POTASSIUM
Potassium: 5.5 mmol/L — ABNORMAL HIGH (ref 3.5–5.1)
Potassium: 5.3 mmol/L — ABNORMAL HIGH (ref 3.5–5.1)
Potassium: 4.7 mmol/L (ref 3.5–5.1)
Potassium: 4.3 mmol/L (ref 3.5–5.1)

## 2021-10-01 LAB — HEPATIC FUNCTION PANEL
ALT: 32 U/L (ref 0–44)
AST: 60 U/L — ABNORMAL HIGH (ref 15–41)
Albumin: 3 g/dL — ABNORMAL LOW (ref 3.5–5.0)
Alkaline Phosphatase: 31 U/L — ABNORMAL LOW (ref 38–126)
Bilirubin, Direct: 0.3 mg/dL — ABNORMAL HIGH (ref 0.0–0.2)
Indirect Bilirubin: 0.8 mg/dL (ref 0.3–0.9)
Total Bilirubin: 1.1 mg/dL (ref 0.3–1.2)
Total Protein: 6.1 g/dL — ABNORMAL LOW (ref 6.5–8.1)

## 2021-10-01 LAB — MAGNESIUM: Magnesium: 2.4 mg/dL (ref 1.7–2.4)

## 2021-10-01 LAB — PHOSPHORUS: Phosphorus: 2.5 mg/dL (ref 2.5–4.6)

## 2021-10-01 LAB — PROTIME-INR
INR: 1.2 (ref 0.8–1.2)
Prothrombin Time: 15 seconds (ref 11.4–15.2)

## 2021-10-01 MED ORDER — ACETAMINOPHEN 650 MG RE SUPP
650.0000 mg | Freq: Four times a day (QID) | RECTAL | Status: DC | PRN
  Administered 2021-10-01 – 2021-10-14 (×3): 650 mg via RECTAL
  Filled 2021-10-01 (×3): qty 1

## 2021-10-01 MED ORDER — SODIUM CHLORIDE 0.9 % IV SOLN: INTRAVENOUS | Status: AC

## 2021-10-01 MED ORDER — FENTANYL CITRATE PF 50 MCG/ML IJ SOSY
50.0000 ug | PREFILLED_SYRINGE | INTRAMUSCULAR | Status: DC | PRN
  Administered 2021-10-01 – 2021-10-02 (×4): 50 ug via INTRAVENOUS
  Filled 2021-10-01 (×4): qty 1

## 2021-10-01 MED ORDER — DEXTROSE-NACL 5-0.9 % IV SOLN: INTRAVENOUS | Status: DC

## 2021-10-01 MED ORDER — INSULIN GLARGINE-YFGN 100 UNIT/ML ~~LOC~~ SOLN
66.0000 [IU] | Freq: Once | SUBCUTANEOUS | Status: DC
  Filled 2021-10-01: qty 0.66

## 2021-10-01 MED ORDER — PANTOPRAZOLE SODIUM 40 MG IV SOLR
40.0000 mg | Freq: Two times a day (BID) | INTRAVENOUS | Status: DC
  Administered 2021-10-01 – 2021-10-14 (×28): 40 mg via INTRAVENOUS
  Filled 2021-10-01 (×28): qty 10

## 2021-10-01 MED ORDER — NOREPINEPHRINE 4 MG/250ML-% IV SOLN
2.0000 ug/min | INTRAVENOUS | Status: DC
  Administered 2021-10-01 (×2): 6 ug/min via INTRAVENOUS
  Administered 2021-10-02: 8 ug/min via INTRAVENOUS
  Filled 2021-10-01 (×3): qty 250

## 2021-10-01 MED ORDER — DEXMEDETOMIDINE HCL IN NACL 400 MCG/100ML IV SOLN
0.0000 ug/kg/h | INTRAVENOUS | Status: DC
  Administered 2021-10-01: 0.4 ug/kg/h via INTRAVENOUS
  Filled 2021-10-01: qty 100

## 2021-10-01 MED ORDER — SODIUM CHLORIDE 0.9 % IV BOLUS
500.0000 mL | Freq: Once | INTRAVENOUS | Status: AC
Start: 2021-10-01 — End: 2021-10-01
  Administered 2021-10-01: 500 mL via INTRAVENOUS

## 2021-10-01 MED ORDER — DEXMEDETOMIDINE HCL IN NACL 400 MCG/100ML IV SOLN
0.4000 ug/kg/h | INTRAVENOUS | Status: DC
  Administered 2021-10-01: 0.4 ug/kg/h via INTRAVENOUS
  Administered 2021-10-02: 0.8 ug/kg/h via INTRAVENOUS
  Administered 2021-10-02: 1.2 ug/kg/h via INTRAVENOUS
  Filled 2021-10-01 (×3): qty 100

## 2021-10-01 MED ORDER — SODIUM CHLORIDE 0.9 % IV BOLUS
500.0000 mL | Freq: Once | INTRAVENOUS | Status: AC
  Administered 2021-10-01: 500 mL via INTRAVENOUS

## 2021-10-01 MED ORDER — ACETAMINOPHEN 10 MG/ML IV SOLN
1000.0000 mg | Freq: Three times a day (TID) | INTRAVENOUS | Status: AC
  Administered 2021-10-01 – 2021-10-02 (×3): 1000 mg via INTRAVENOUS
  Filled 2021-10-01 (×3): qty 100

## 2021-10-01 MED ORDER — INSULIN ASPART 100 UNIT/ML IJ SOLN
1.0000 [IU] | INTRAMUSCULAR | Status: DC
  Administered 2021-10-02 (×2): 2 [IU] via SUBCUTANEOUS
  Filled 2021-10-01 (×2): qty 1

## 2021-10-01 MED ORDER — INSULIN DETEMIR 100 UNIT/ML ~~LOC~~ SOLN
20.0000 [IU] | Freq: Two times a day (BID) | SUBCUTANEOUS | Status: DC
  Administered 2021-10-01: 20 [IU] via SUBCUTANEOUS
  Filled 2021-10-01 (×2): qty 0.2

## 2021-10-01 MED ORDER — DEXTROSE 50 % IV SOLN: 0.0000 mL | INTRAVENOUS | Status: DC | PRN

## 2021-10-01 MED ORDER — SODIUM POLYSTYRENE SULFONATE 15 GM/60ML PO SUSP
45.0000 g | Freq: Once | ORAL | Status: AC
  Administered 2021-10-01: 45 g via RECTAL
  Filled 2021-10-01: qty 180

## 2021-10-01 MED ORDER — INSULIN REGULAR(HUMAN) IN NACL 100-0.9 UT/100ML-% IV SOLN
INTRAVENOUS | Status: DC
Start: 2021-10-01 — End: 2021-10-02
  Administered 2021-10-01: 13 [IU]/h via INTRAVENOUS
  Administered 2021-10-01: 5.5 [IU]/h via INTRAVENOUS
  Administered 2021-10-01: 16 [IU]/h via INTRAVENOUS
  Filled 2021-10-01 (×2): qty 100

## 2021-10-01 MED ORDER — PANTOPRAZOLE 80MG IVPB - SIMPLE MED
80.0000 mg | Freq: Once | INTRAVENOUS | Status: AC
  Administered 2021-10-01: 80 mg via INTRAVENOUS
  Filled 2021-10-01: qty 100

## 2021-10-01 NOTE — Progress Notes (Signed)
Patient transported to CT on transport vent. 

## 2021-10-01 NOTE — Progress Notes (Signed)
[facility] Progress Note Patient Name: HILLEL CARD DOB: [birth year] MRN: [id number]   Date of Service  10/01/2021  HPI/Events of Note  Truman Hayward, NP discussion.  Reason: reddish like bilious NG suction . Hg stat is stable at 14. On Levo at 20.  S/p ex lap lysis of adhesions, no ischemic bowel.  71 year old male with a history of CAD, ischemic cardiomyopathy, type 2 diabetes, HTN, CKD, and OSA who presented to the ED with abdominal pain, nausea and vomiting. He endorsed diffuse abdominal pain the ED team and underwent a CT scan of the abdomen was performed showing portal venous and mesenteric gas, highly concerning for mesenteric ischemia. He was started on antibiotics and taken emergently to the OR by Dr. Christian Mate for an exploratory   Camera eval done.  Data reviewed.    eICU Interventions  - continue to monitor. On protonix. If gets frank GI bleeding or dropping Hg to call Surgery or GI on call or CTA abdomen-IR intervention.      Intervention Category Intermediate Interventions: Communication with other healthcare providers and/or family  Elmer Sow 10/01/2021, 12:20 AM

## 2021-10-01 NOTE — Progress Notes (Signed)
Pharmacy Electrolyte Monitoring Consult:  Pharmacy consulted to assist in monitoring and replacing electrolytes in this 71 y.o. male admitted on 10/19/2021 with Constipation   Labs:  Sodium (mmol/L)  Date Value  10/01/2021 138  09/12/2011 139   Potassium (mmol/L)  Date Value  10/01/2021 5.3 (H)  09/12/2011 4.5   Magnesium (mg/dL)  Date Value  10/01/2021 2.4   Phosphorus (mg/dL)  Date Value  10/01/2021 2.5   Calcium (mg/dL)  Date Value  10/01/2021 7.8 (L)   Calcium, Total (mg/dL)  Date Value  09/12/2011 8.6   Albumin (g/dL)  Date Value  10/01/2021 3.0 (L)    Assessment/Plan: K 5.3 Orders for lokelma bid  K q4h -other electrolytes WNL with this am labs F/u electrolytes with tomorrow am labs  Nalaysia Manganiello A 10/01/2021 9:45 AM

## 2021-10-01 NOTE — Progress Notes (Signed)
[facility] Day(s): 1.   Post op day(s): 1 Day Post-Op.   Interval History: Patient seen and examined, no acute events or new complaints overnight.  Patient successfully extubated by critical care team.  Currently utilizing pressure controlled assistance.     Vital signs in last 24 hours: [min-max] current  Temp:  [98.1 F (36.7 C)-102.2 F (39 C)] 99.2 F (37.3 C) (08/13 0600) Pulse Rate:  [54-128] 111 (08/13 0630) Resp:  [16-23] 22 (08/13 0630) BP: (88-142)/(55-128) 98/62 (08/13 0630) SpO2:  [83 %-97 %] 94 % (08/13 0630) Arterial Line BP: (81-155)/(46-86) 81/55 (08/13 0630) FiO2 (%):  [40 %-45 %] 45 % (08/13 0334) Weight:  [110 kg] 110 kg (08/12 1544)       Weight: 110 kg     Intake/Output last 2 shifts:  08/12 0701 - 08/13 0700 In: 4632.5 [I.V.:3172.3; IV Piggyback:1250.2] Out: 5462 [Urine:1515]   Physical Exam:  Constitutional: Arousable, cooperative. Respiratory: breathing with BiPAP assistance Cardiovascular: Tachycardic rate and sinus rhythm  Gastrointestinal: Consistent with postop distention, incision clean dry and intact under honeycomb dressing.   Integumentary: Warm well perfused.  Labs:     Latest Ref Rng & Units 10/01/2021    5:00 AM 10/16/2021   11:55 PM 09/29/2021    4:50 PM  CBC  WBC 4.0 - 10.5 K/uL 12.4  7.8  8.9   Hemoglobin 13.0 - 17.0 g/dL 14.4  14.4  14.3   Hematocrit 39.0 - 52.0 % 45.1  44.0  44.4   Platelets 150 - 400 K/uL 151  105  121       Latest Ref Rng & Units 10/01/2021    5:00 AM 10/01/2021   12:42 AM 10/12/2021   10:15 PM  CMP  Glucose 70 - 99 mg/dL 269  324    BUN 8 - 23 mg/dL 51  45    Creatinine 0.61 - 1.24 mg/dL 2.32  2.10    Sodium 135 - 145 mmol/L 138  139    Potassium 3.5 - 5.1 mmol/L 3.5 - 5.1 mmol/L 5.5    5.3  6.1  7.2   Chloride 98 - 111 mmol/L 105  105    CO2 22 - 32 mmol/L 22  25    Calcium 8.9 - 10.3 mg/dL 7.8  8.1    Total Protein 6.5 - 8.1 g/dL 6.1     Total  Bilirubin 0.3 - 1.2 mg/dL 1.1     Alkaline Phos 38 - 126 U/L 31     AST 15 - 41 U/L 60     ALT 0 - 44 U/L 32        Imaging studies:   CLINICAL DATA:  71 year old male with history of pneumonia.   EXAM: CT CHEST, ABDOMEN AND PELVIS WITHOUT CONTRAST   TECHNIQUE: Multidetector CT imaging of the chest, abdomen and pelvis was performed following the standard protocol without IV contrast.   RADIATION DOSE REDUCTION: This exam was performed according to the departmental dose-optimization program which includes automated exposure control, adjustment of the mA and/or kV according to patient size and/or use of iterative reconstruction technique.   COMPARISON:  CT of the abdomen and pelvis 10/08/2021. Chest CTA 10/12/2017.   FINDINGS: CT CHEST FINDINGS   Cardiovascular: Heart size is normal. There is no significant pericardial fluid, thickening or pericardial calcification. There is aortic atherosclerosis, as well as atherosclerosis of the great vessels of the mediastinum and the coronary arteries, including calcified atherosclerotic plaque in the  left main, left anterior descending, left circumflex and right coronary arteries.   Mediastinum/Nodes: No pathologically enlarged mediastinal or hilar lymph nodes. Please note that accurate exclusion of hilar adenopathy is limited on noncontrast CT scans. Patient is intubated, with the tip of the endotracheal tube approximately 7.9 cm above the carina. Nasogastric tube extends into the stomach. Esophagus is otherwise unremarkable in appearance. No axillary lymphadenopathy.   Lungs/Pleura: Dependent areas of subsegmental atelectasis or scarring are noted in the lower lobes of the lungs bilaterally. There may also be a small amount of airspace consolidation in the dependent portion of the right lower lobe. No pleural effusions. No suspicious appearing pulmonary nodules or masses are noted. Several calcified granulomas are noted in the  lungs bilaterally.   Musculoskeletal: There are no aggressive appearing lytic or blastic lesions noted in the visualized portions of the skeleton. Several old healed posterior right-sided rib fractures are incidentally noted.   CT ABDOMEN PELVIS FINDINGS   Hepatobiliary: Previously noted portal venous gas has resolved. No definite suspicious cystic or solid hepatic lesions are confidently identified on today's noncontrast CT examination. Liver has a slightly shrunken appearance and nodular contour, indicative of underlying cirrhosis. Status post cholecystectomy.   Pancreas: No definite pancreatic mass or peripancreatic fluid collections or inflammatory changes are noted on today's noncontrast CT examination.   Spleen: Trace amount of perisplenic fluid. Spleen is otherwise unremarkable in appearance.   Adrenals/Urinary Tract: Exophytic intermediate attenuation (43 HU) 3 cm lesion extending from the upper pole of the right kidney is indeterminate. Left kidney and bilateral adrenal glands are otherwise normal in appearance. No hydroureteronephrosis. Urinary bladder is nearly completely decompressed around an indwelling Foley balloon catheter. Some contrast material is noted within the lumen of the urinary bladder, as is a small amount of gas non dependently which is presumably iatrogenic.   Stomach/Bowel: The appearance of the stomach is normal. Several mildly dilated loops of small bowel are again noted, measuring up to 4.5 cm in diameter in the central abdomen. Multiple air-fluid levels are noted in the small bowel. Oral contrast material is noted in the distal small bowel and colon. Rectal bag in place. A few scattered colonic diverticuli are noted without definite focal surrounding inflammatory changes to indicate an acute diverticulitis at this time. The appendix is not confidently identified and may be surgically absent. Regardless, there are no inflammatory changes noted  adjacent to the cecum to suggest the presence of an acute appendicitis at this time.   Vascular/Lymphatic: Atherosclerosis in the abdominal aorta and pelvic vasculature. No lymphadenopathy noted in the abdomen or pelvis.   Reproductive: Prostate gland and seminal vesicles are unremarkable in appearance.   Other: Small volume of ascites. Small volume of pneumoperitoneum, within normal limits given the recent open laparotomy.   Musculoskeletal: New midline skin staples from recent laparotomy. There are no aggressive appearing lytic or blastic lesions noted in the visualized portions of the skeleton.   IMPRESSION: 1. Resolution of previously noted pneumatosis and portal venous gas portal venous gas. There are some persistent areas of small-bowel dilatation, with multiple ill-defined loops of small bowel and multiple air-fluid levels. This may simply represent a mild postoperative ileus, although persistent partial small bowel obstruction is not entirely excluded. 2. Trace volume of ascites. 3. Dependent opacities in the lung bases bilaterally favored to predominantly reflect areas of subsegmental atelectasis and/or chronic scarring, although a small amount of airspace consolidation is not excluded in the posterior aspect of the right lower lobe. 4. Aortic  atherosclerosis, in addition to left main and three-vessel coronary artery disease. Assessment for potential risk factor modification, dietary therapy or pharmacologic therapy may be warranted, if clinically indicated. 5. Additional incidental findings, as above.     Electronically Signed   By: Vinnie Langton M.D.   On: 10/01/2021 10:20  Assessment/Plan:  71 y.o. male with  1 Day Post-Op s/p exploratory laparotomy with lysis of adhesions for presumed mesenteric ischemia, possible small bowel perforation with associated portal venous gas, complicated by pertinent comorbidities including:  Patient Active Problem List    Diagnosis Date Noted   Acute mesenteric ischemia ([place]) 09/29/2021   Bowel obstruction ([place]) 09/22/2021   Acute respiratory failure with hypoxia and hypercapnia (HCC) 10/06/2021   Hyperkalemia 10/02/2021   Mesenteric ischemia (HCC)    Hypoglycemia 06/05/2021   SBO (small bowel obstruction) ([place]) 09/15/2020   AKI (acute kidney injury) ([place]) 02/16/2020   S/P TKR (total knee replacement) using cement, right 02/11/2020   Incisional hernia 09/10/2019   Acute appendicitis 03/30/2019   Status post total knee replacement using cement, left 10/08/2017   Small bowel obstruction, recurrent ([place]) 10/27/2016   Insulin overdose 01/22/2016   CKD (chronic kidney disease) stage 3, GFR 30-59 ml/min (HCC) 12/12/2015   Diabetic peripheral neuropathy ([place]) 12/12/2015   DM2 (diabetes mellitus, type 2) ([place]) 12/12/2015   Small bowel obstruction due to adhesions ([place]) 12/12/2015   Intestinal adhesions with obstruction (HCC)    Obesity, Class I, BMI 30-34.9 05/11/2015   Mixed Alzheimer's and vascular dementia ([place]) 05/04/2015   Uncontrolled diabetes mellitus type 2 without complications 50/53/9767   Cardiomyopathy, ischemic 34/19/3790   Chronic systolic heart failure secondary to ischemic cardiomyopathy ([place]) 11/04/2014   Coronary artery disease 11/04/2014   SOB (shortness of breath) on exertion 04/26/2014   Cardiomyopathy ([place]) 04/23/2014   H/O cardiac catheterization 04/23/2014   H/O degenerative disc disease 04/23/2014   History of PTCA 04/23/2014   HTN (hypertension) 04/23/2014   Hyperlipidemia, unspecified 04/23/2014   MI (myocardial infarction) ([place]) 04/23/2014   OSA (obstructive sleep apnea) 04/23/2014    -Continue NG tube to low wall suction.  -Anticipating postoperative ileus, may consider TPN.  -Suspect there is no significant role for continued antibiotics with absence of intra-abdominal catastrophe.  -Appreciate CT scan follow-up to show resolution of portal vein gas.  -Remainder of  critical care per ICU team.  Appreciate their assistance in taking care of this gentleman.  All of the above findings and recommendations were discussed with the patient, and all of patient's questions were answered to their expressed satisfaction.  -- Ronny Bacon, M.D., [facility] 10/01/2021

## 2021-10-01 NOTE — Anesthesia Postprocedure Evaluation (Signed)
Anesthesia Post Note  Patient: Todd Spencer  Procedure(s) Performed: EXPLORATORY LAPAROTOMY (Abdomen) LYSIS OF ADHESION (Abdomen)  Patient location during evaluation: ICU Anesthesia Type: General Level of consciousness: sedated Pain management: pain level controlled Vital Signs Assessment: post-procedure vital signs reviewed and stable Respiratory status: patient remains intubated per anesthesia plan Cardiovascular status: stable Postop Assessment: no apparent nausea or vomiting Anesthetic complications: no   No notable events documented.   Last Vitals:  Vitals:   10/01/21 0924 10/01/21 0930  BP:  105/72  Pulse: (!) 109 (!) 109  Resp: (!) 22 (!) 22  Temp:    SpO2: 95% 96%    Last Pain:  Vitals:   10/01/21 0800  TempSrc: Axillary  PainSc:                  Arita Miss
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
## 2021-10-01 NOTE — Progress Notes (Signed)
Extubation order written.  Cuff leak noted.  Patient extubated and placed on BIPAP.

## 2021-10-01 NOTE — Progress Notes (Signed)
Pharmacy Antibiotic Note  Todd Spencer is a 71 y.o. male admitted on 10/15/2021 with  IAI .  Pharmacy has been consulted for Zosyn dosing.  Plan: Patient received zosyn 3/375gm IV x 1 in ED Will continue with Zosyn 3.375gm EI IV q8h    Weight: 110 kg (242 lb 8.1 oz)  Temp (24hrs), Avg:100 F (37.8 C), Min:98.1 F (36.7 C), Max:102.2 F (39 C)  Recent Labs  Lab 10/11/2021 0858 10/02/2021 1650 10/10/2021 1854 09/19/2021 2355 10/01/21 0042 10/01/21 0500  WBC 6.5 8.9  --  7.8  --  12.4*  CREATININE 1.85* 1.77*  --   --  2.10* 2.32*  LATICACIDVEN 2.4* 2.4* 2.5*  --  3.7* 3.5*     Estimated Creatinine Clearance: 36.3 mL/min (A) (by C-G formula based on SCr of 2.32 mg/dL (H)).    No Known Allergies  Antimicrobials this admission: zosyn 8/12 >>       >>    Dose adjustments this admission:    Microbiology results:   BCx:       UCx:      Sputum:    8/12 MRSA PCR: pend  Thank you for allowing pharmacy to be a part of this patient's care.  Amali Uhls A 10/01/2021 9:49 AM

## 2021-10-02 ENCOUNTER — Inpatient Hospital Stay: Payer: Medicare HMO

## 2021-10-02 ENCOUNTER — Encounter: Payer: Self-pay | Admitting: Surgery

## 2021-10-02 DIAGNOSIS — J9602 Acute respiratory failure with hypercapnia: Secondary | ICD-10-CM | POA: Diagnosis not present

## 2021-10-02 DIAGNOSIS — J9601 Acute respiratory failure with hypoxia: Secondary | ICD-10-CM

## 2021-10-02 LAB — CBC WITH DIFFERENTIAL/PLATELET
Abs Immature Granulocytes: 0.14 10*3/uL — ABNORMAL HIGH (ref 0.00–0.07)
Basophils Absolute: 0.1 10*3/uL (ref 0.0–0.1)
Basophils Relative: 1 %
Eosinophils Absolute: 0.1 10*3/uL (ref 0.0–0.5)
Eosinophils Relative: 1 %
HCT: 37.1 % — ABNORMAL LOW (ref 39.0–52.0)
Hemoglobin: 12.2 g/dL — ABNORMAL LOW (ref 13.0–17.0)
Immature Granulocytes: 2 %
Lymphocytes Relative: 8 %
Lymphs Abs: 0.7 10*3/uL (ref 0.7–4.0)
MCH: 29.5 pg (ref 26.0–34.0)
MCHC: 32.9 g/dL (ref 30.0–36.0)
MCV: 89.8 fL (ref 80.0–100.0)
Monocytes Absolute: 1.2 10*3/uL — ABNORMAL HIGH (ref 0.1–1.0)
Monocytes Relative: 15 %
Neutro Abs: 6 10*3/uL (ref 1.7–7.7)
Neutrophils Relative %: 73 %
Platelets: 96 10*3/uL — ABNORMAL LOW (ref 150–400)
RBC: 4.13 MIL/uL — ABNORMAL LOW (ref 4.22–5.81)
RDW: 14.9 % (ref 11.5–15.5)
WBC: 8.2 10*3/uL (ref 4.0–10.5)
nRBC: 0 % (ref 0.0–0.2)

## 2021-10-02 LAB — MAGNESIUM: Magnesium: 2.2 mg/dL (ref 1.7–2.4)

## 2021-10-02 LAB — BLOOD GAS, ARTERIAL
Acid-base deficit: 0.4 mmol/L (ref 0.0–2.0)
Bicarbonate: 25.4 mmol/L (ref 20.0–28.0)
Delivery systems: POSITIVE
O2 Saturation: 99 %
Patient temperature: 37
pCO2 arterial: 45 mmHg (ref 32–48)
pH, Arterial: 7.36 (ref 7.35–7.45)
pO2, Arterial: 110 mmHg — ABNORMAL HIGH (ref 83–108)

## 2021-10-02 LAB — PROCALCITONIN: Procalcitonin: 10.99 ng/mL

## 2021-10-02 LAB — BASIC METABOLIC PANEL
Anion gap: 8 (ref 5–15)
BUN: 58 mg/dL — ABNORMAL HIGH (ref 8–23)
CO2: 24 mmol/L (ref 22–32)
Calcium: 7.6 mg/dL — ABNORMAL LOW (ref 8.9–10.3)
Chloride: 105 mmol/L (ref 98–111)
Creatinine, Ser: 1.84 mg/dL — ABNORMAL HIGH (ref 0.61–1.24)
GFR, Estimated: 38 mL/min — ABNORMAL LOW (ref >60)
Glucose, Bld: 185 mg/dL — ABNORMAL HIGH (ref 70–99)
Potassium: 5 mmol/L (ref 3.5–5.1)
Sodium: 137 mmol/L (ref 135–145)

## 2021-10-02 LAB — GLUCOSE, CAPILLARY
Glucose-Capillary: 108 mg/dL — ABNORMAL HIGH (ref 70–99)
Glucose-Capillary: 118 mg/dL — ABNORMAL HIGH (ref 70–99)
Glucose-Capillary: 132 mg/dL — ABNORMAL HIGH (ref 70–99)
Glucose-Capillary: 159 mg/dL — ABNORMAL HIGH (ref 70–99)
Glucose-Capillary: 182 mg/dL — ABNORMAL HIGH (ref 70–99)
Glucose-Capillary: 183 mg/dL — ABNORMAL HIGH (ref 70–99)
Glucose-Capillary: 191 mg/dL — ABNORMAL HIGH (ref 70–99)
Glucose-Capillary: 195 mg/dL — ABNORMAL HIGH (ref 70–99)

## 2021-10-02 MED ORDER — OXYCODONE HCL 5 MG PO TABS
5.0000 mg | ORAL_TABLET | ORAL | Status: DC | PRN
  Administered 2021-10-02 – 2021-10-15 (×3): 5 mg
  Filled 2021-10-02 (×3): qty 1

## 2021-10-02 MED ORDER — FENTANYL CITRATE PF 50 MCG/ML IJ SOSY
50.0000 ug | PREFILLED_SYRINGE | INTRAMUSCULAR | Status: DC | PRN
  Administered 2021-10-02 – 2021-10-18 (×24): 50 ug via INTRAVENOUS
  Filled 2021-10-02 (×22): qty 1

## 2021-10-02 MED ORDER — ALBUTEROL SULFATE (2.5 MG/3ML) 0.083% IN NEBU
2.5000 mg | INHALATION_SOLUTION | Freq: Two times a day (BID) | RESPIRATORY_TRACT | Status: DC
  Administered 2021-10-02 – 2021-10-18 (×32): 2.5 mg via RESPIRATORY_TRACT
  Filled 2021-10-02 (×33): qty 3

## 2021-10-02 MED ORDER — FREE WATER
30.0000 mL | Status: DC
  Administered 2021-10-02 – 2021-10-04 (×5): 30 mL

## 2021-10-02 MED ORDER — FENTANYL CITRATE PF 50 MCG/ML IJ SOSY
25.0000 ug | PREFILLED_SYRINGE | INTRAMUSCULAR | Status: DC | PRN
  Administered 2021-10-02: 25 ug via INTRAVENOUS
  Filled 2021-10-02: qty 1

## 2021-10-02 MED ORDER — OXYCODONE HCL 5 MG PO TABS: 5.0000 mg | ORAL_TABLET | Freq: Four times a day (QID) | ORAL | Status: DC

## 2021-10-02 MED ORDER — INSULIN ASPART 100 UNIT/ML IJ SOLN
0.0000 [IU] | INTRAMUSCULAR | Status: DC
  Administered 2021-10-02 – 2021-10-03 (×3): 3 [IU] via SUBCUTANEOUS
  Administered 2021-10-03 – 2021-10-04 (×2): 2 [IU] via SUBCUTANEOUS
  Administered 2021-10-04 (×3): 3 [IU] via SUBCUTANEOUS
  Administered 2021-10-04: 5 [IU] via SUBCUTANEOUS
  Administered 2021-10-04: 3 [IU] via SUBCUTANEOUS
  Administered 2021-10-05 (×2): 5 [IU] via SUBCUTANEOUS
  Administered 2021-10-05: 3 [IU] via SUBCUTANEOUS
  Administered 2021-10-05 – 2021-10-06 (×5): 5 [IU] via SUBCUTANEOUS
  Filled 2021-10-02 (×20): qty 1

## 2021-10-02 MED ORDER — ORAL CARE MOUTH RINSE
15.0000 mL | Freq: Four times a day (QID) | OROMUCOSAL | Status: DC
  Administered 2021-10-02 – 2021-10-11 (×35): 15 mL via OROMUCOSAL

## 2021-10-02 MED ORDER — GABAPENTIN 250 MG/5ML PO SOLN
100.0000 mg | Freq: Two times a day (BID) | ORAL | Status: DC
  Administered 2021-10-02 – 2021-10-17 (×31): 100 mg
  Filled 2021-10-02 (×33): qty 2

## 2021-10-02 MED ORDER — VITAL 1.5 CAL PO LIQD
1000.0000 mL | ORAL | Status: DC
  Administered 2021-10-02: 1000 mL

## 2021-10-02 MED ORDER — HALOPERIDOL LACTATE 5 MG/ML IJ SOLN: 2.0000 mg | Freq: Four times a day (QID) | INTRAMUSCULAR | Status: DC | PRN

## 2021-10-02 MED ORDER — FENTANYL CITRATE PF 50 MCG/ML IJ SOSY: 25.0000 ug | PREFILLED_SYRINGE | INTRAMUSCULAR | Status: DC | PRN

## 2021-10-02 MED ORDER — FENTANYL CITRATE PF 50 MCG/ML IJ SOSY
25.0000 ug | PREFILLED_SYRINGE | Freq: Once | INTRAMUSCULAR | Status: AC
  Administered 2021-10-02: 25 ug via INTRAVENOUS
  Filled 2021-10-02: qty 1
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
## 2021-10-02 NOTE — Progress Notes (Signed)
[facility] Day(s): 2.   Post op day(s): 2 Days Post-Op.   Interval History:  Patient seen and examined No acute events or new complaints overnight.  Extubated by hypercapnic; on BiPAP; ABG better this AM Previous noted leukocytosis resolved; 8.2K Hgb to 12.2 Renal function improving; sCr - 1.84; UO - 2100 ccs No significant electrolyte derangements NGT with 150 ccs out He does have stool in rectal tube  Vital signs in last 24 hours: [min-max] current  Temp:  [97.5 F (36.4 C)-99.9 F (37.7 C)] 99.1 F (37.3 C) (08/14 0315) Pulse Rate:  [68-114] 69 (08/14 0731) Resp:  [11-25] 15 (08/14 0731) BP: (87-156)/(49-109) 151/78 (08/14 0615) SpO2:  [93 %-98 %] 95 % (08/14 0731) Arterial Line BP: (60-150)/(27-87) 126/46 (08/14 0615) FiO2 (%):  [40 %] 40 % (08/14 0731) Weight:  [109.6 kg] 109.6 kg (08/14 0403)       Weight: 109.6 kg     Intake/Output last 2 shifts:  08/13 0701 - 08/14 0700 In: 2247.4 [I.V.:1749.7; IV Piggyback:497.7] Out: 2250 [Urine:2100; Emesis/NG output:150]   Physical Exam:  Constitutional: alert, cooperative HEENT: NGT in place; bilious Respiratory: On BiPAP Cardiovascular: regular rate and sinus rhythm  Gastrointestinal: Soft, incisional soreness, he does seem distended, no rebound/guarding Genitourinary: Foley in place; clear urine. Rectal tube present; stool in bag Integumentary: Laparotomy is CDI with staples and honeycomb; no erythema, no drainage  Labs:     Latest Ref Rng & Units 10/02/2021    3:34 AM 10/01/2021    5:00 AM 09/29/2021   11:55 PM  CBC  WBC 4.0 - 10.5 K/uL 8.2  12.4  7.8   Hemoglobin 13.0 - 17.0 g/dL 12.2  14.4  14.4   Hematocrit 39.0 - 52.0 % 37.1  45.1  44.0   Platelets 150 - 400 K/uL 96  151  105       Latest Ref Rng & Units 10/02/2021    3:34 AM 10/01/2021    5:32 PM 10/01/2021    1:40 PM  CMP  Glucose 70 - 99 mg/dL 185     BUN 8 - 23 mg/dL 58     Creatinine 0.61 - 1.24  mg/dL 1.84     Sodium 135 - 145 mmol/L 137     Potassium 3.5 - 5.1 mmol/L 5.0  4.3  4.7   Chloride 98 - 111 mmol/L 105     CO2 22 - 32 mmol/L 24     Calcium 8.9 - 10.3 mg/dL 7.6        Imaging studies: No new pertinent imaging studies   Assessment/Plan:  71 y.o. male with anticipated post-operative ileus 2 Days Post-Op s/p exploratory laparotomy and LOA for CT findings concerning for SBO, pneumatosis, and pneumoperitoneum   - Continue NPO; suspect he will benefit for TPN if unable to advance diet or utilize enteric feeding in next 24-48 hours  - Continue NGT decompression; monitor and record output  - Foley catheter per primary service; good UO - Monitor abdominal examination; on-going bowel function   - Can consider serial KUBs; will order for AM   - Monitor leukocytosis; resolved  - Monitor renal function; improving  - OOB once feasible and more alert   - Further management per primary service; we will of course follow allong  All of the above findings and recommendations were discussed with the patient and the medical team.  -- Edison Simon, PA-C [facility] 10/02/2021, 7:32 AM M-F: 7am - 4pm

## 2021-10-02 NOTE — Progress Notes (Signed)
Pharmacy Electrolyte Monitoring Consult:  Pharmacy consulted to assist in monitoring and replacing electrolytes in this 71 y.o. male admitted on 10/12/2021 with Constipation  Labs:  Sodium (mmol/L)  Date Value  10/02/2021 137  09/12/2011 139   Potassium (mmol/L)  Date Value  10/02/2021 5.0  09/12/2011 4.5   Magnesium (mg/dL)  Date Value  10/02/2021 2.2   Phosphorus (mg/dL)  Date Value  10/01/2021 2.5   Calcium (mg/dL)  Date Value  10/02/2021 7.6 (L)   Calcium, Total (mg/dL)  Date Value  09/12/2011 8.6   Albumin (g/dL)  Date Value  10/01/2021 3.0 (L)   Scr down-trending MIVF: NS at 50 cc/hr  Assessment/Plan: --K 5, continue Lokelma 10 g BID per tube --Follow-up electrolytes with AM labs tomorrow  Benita Gutter 10/02/2021 8:03 AM

## 2021-10-03 ENCOUNTER — Inpatient Hospital Stay: Payer: Medicare HMO

## 2021-10-03 ENCOUNTER — Inpatient Hospital Stay: Payer: Self-pay

## 2021-10-03 DIAGNOSIS — K55059 Acute (reversible) ischemia of intestine, part and extent unspecified: Secondary | ICD-10-CM | POA: Diagnosis not present

## 2021-10-03 DIAGNOSIS — J9601 Acute respiratory failure with hypoxia: Secondary | ICD-10-CM | POA: Diagnosis not present

## 2021-10-03 DIAGNOSIS — J9602 Acute respiratory failure with hypercapnia: Secondary | ICD-10-CM | POA: Diagnosis not present

## 2021-10-03 LAB — BASIC METABOLIC PANEL
Anion gap: 7 (ref 5–15)
BUN: 39 mg/dL — ABNORMAL HIGH (ref 8–23)
CO2: 22 mmol/L (ref 22–32)
Calcium: 8 mg/dL — ABNORMAL LOW (ref 8.9–10.3)
Chloride: 109 mmol/L (ref 98–111)
Creatinine, Ser: 1.1 mg/dL (ref 0.61–1.24)
GFR, Estimated: >60 mL/min (ref >60)
Glucose, Bld: 126 mg/dL — ABNORMAL HIGH (ref 70–99)
Potassium: 3.9 mmol/L (ref 3.5–5.1)
Sodium: 138 mmol/L (ref 135–145)

## 2021-10-03 LAB — BLOOD GAS, ARTERIAL
Acid-base deficit: 0.7 mmol/L (ref 0.0–2.0)
Acid-base deficit: 1.9 mmol/L (ref 0.0–2.0)
Bicarbonate: 24.2 mmol/L (ref 20.0–28.0)
Bicarbonate: 24.3 mmol/L (ref 20.0–28.0)
FIO2: 0.7 %
MECHVT: 550 mL
O2 Content: 4 L/min
O2 Saturation: 95.1 %
O2 Saturation: 99.4 %
PEEP: 10 cmH2O
Patient temperature: 37
Patient temperature: 37
RATE: 14 resp/min
pCO2 arterial: 40 mmHg (ref 32–48)
pCO2 arterial: 46 mmHg (ref 32–48)
pH, Arterial: 7.39 (ref 7.35–7.45)
pH, Arterial: 7.33 — ABNORMAL LOW (ref 7.35–7.45)
pO2, Arterial: 69 mmHg — ABNORMAL LOW (ref 83–108)
pO2, Arterial: 222 mmHg — ABNORMAL HIGH (ref 83–108)

## 2021-10-03 LAB — CBC WITH DIFFERENTIAL/PLATELET
Abs Immature Granulocytes: 0.05 10*3/uL (ref 0.00–0.07)
Basophils Absolute: 0 10*3/uL (ref 0.0–0.1)
Basophils Relative: 1 %
Eosinophils Absolute: 0 10*3/uL (ref 0.0–0.5)
Eosinophils Relative: 0 %
HCT: 34.9 % — ABNORMAL LOW (ref 39.0–52.0)
Hemoglobin: 11.4 g/dL — ABNORMAL LOW (ref 13.0–17.0)
Immature Granulocytes: 1 %
Lymphocytes Relative: 6 %
Lymphs Abs: 0.5 10*3/uL — ABNORMAL LOW (ref 0.7–4.0)
MCH: 29.3 pg (ref 26.0–34.0)
MCHC: 32.7 g/dL (ref 30.0–36.0)
MCV: 89.7 fL (ref 80.0–100.0)
Monocytes Absolute: 1 10*3/uL (ref 0.1–1.0)
Monocytes Relative: 12 %
Neutro Abs: 6.6 10*3/uL (ref 1.7–7.7)
Neutrophils Relative %: 80 %
Platelets: 85 10*3/uL — ABNORMAL LOW (ref 150–400)
RBC: 3.89 MIL/uL — ABNORMAL LOW (ref 4.22–5.81)
RDW: 14.6 % (ref 11.5–15.5)
WBC: 8.2 10*3/uL (ref 4.0–10.5)
nRBC: 0 % (ref 0.0–0.2)

## 2021-10-03 LAB — GLUCOSE, CAPILLARY
Glucose-Capillary: 120 mg/dL — ABNORMAL HIGH (ref 70–99)
Glucose-Capillary: 118 mg/dL — ABNORMAL HIGH (ref 70–99)
Glucose-Capillary: 163 mg/dL — ABNORMAL HIGH (ref 70–99)
Glucose-Capillary: 191 mg/dL — ABNORMAL HIGH (ref 70–99)
Glucose-Capillary: 189 mg/dL — ABNORMAL HIGH (ref 70–99)

## 2021-10-03 LAB — PHOSPHORUS: Phosphorus: 1.8 mg/dL — ABNORMAL LOW (ref 2.5–4.6)

## 2021-10-03 LAB — PROCALCITONIN: Procalcitonin: 4.49 ng/mL

## 2021-10-03 LAB — MAGNESIUM: Magnesium: 1.9 mg/dL (ref 1.7–2.4)

## 2021-10-03 MED ORDER — DOCUSATE SODIUM 50 MG/5ML PO LIQD
100.0000 mg | Freq: Two times a day (BID) | ORAL | Status: DC
  Administered 2021-10-03 – 2021-10-17 (×18): 100 mg
  Filled 2021-10-03 (×21): qty 10

## 2021-10-03 MED ORDER — ETOMIDATE 2 MG/ML IV SOLN: 20.0000 mg | Freq: Once | INTRAVENOUS | Status: AC

## 2021-10-03 MED ORDER — MAGNESIUM SULFATE 2 GM/50ML IV SOLN
2.0000 g | Freq: Once | INTRAVENOUS | Status: AC
  Administered 2021-10-03: 2 g via INTRAVENOUS
  Filled 2021-10-03: qty 50

## 2021-10-03 MED ORDER — ROCURONIUM BROMIDE 10 MG/ML (PF) SYRINGE
PREFILLED_SYRINGE | INTRAVENOUS | Status: AC
  Filled 2021-10-03: qty 10

## 2021-10-03 MED ORDER — MIDAZOLAM HCL 2 MG/2ML IJ SOLN
2.0000 mg | Freq: Once | INTRAMUSCULAR | Status: AC
Start: 2021-10-03 — End: 2021-10-03

## 2021-10-03 MED ORDER — ROCURONIUM BROMIDE 50 MG/5ML IV SOLN
100.0000 mg | Freq: Once | INTRAVENOUS | Status: AC
  Filled 2021-10-03: qty 10

## 2021-10-03 MED ORDER — MIDAZOLAM HCL 2 MG/2ML IJ SOLN
INTRAMUSCULAR | Status: AC
  Administered 2021-10-03: 2 mg
  Filled 2021-10-03: qty 2

## 2021-10-03 MED ORDER — PROPOFOL 1000 MG/100ML IV EMUL
INTRAVENOUS | Status: AC
  Administered 2021-10-03: 30 mg
  Filled 2021-10-03: qty 100

## 2021-10-03 MED ORDER — SODIUM CHLORIDE 0.9% FLUSH
10.0000 mL | INTRAVENOUS | Status: DC | PRN
  Administered 2021-10-10 – 2021-10-11 (×3): 10 mL

## 2021-10-03 MED ORDER — SODIUM CHLORIDE 0.9% FLUSH
10.0000 mL | Freq: Two times a day (BID) | INTRAVENOUS | Status: DC
  Administered 2021-10-03 – 2021-10-17 (×28): 10 mL

## 2021-10-03 MED ORDER — FENTANYL 2500MCG IN NS 250ML (10MCG/ML) PREMIX INFUSION
0.0000 ug/h | INTRAVENOUS | Status: DC
  Administered 2021-10-03: 100 ug/h via INTRAVENOUS
  Administered 2021-10-04: 175 ug/h via INTRAVENOUS
  Administered 2021-10-05 – 2021-10-07 (×6): 200 ug/h via INTRAVENOUS
  Administered 2021-10-08: 150 ug/h via INTRAVENOUS
  Administered 2021-10-09 (×2): 200 ug/h via INTRAVENOUS
  Filled 2021-10-03 (×12): qty 250

## 2021-10-03 MED ORDER — FENTANYL CITRATE (PF) 100 MCG/2ML IJ SOLN: 100.0000 ug | Freq: Once | INTRAMUSCULAR | Status: AC

## 2021-10-03 MED ORDER — POTASSIUM PHOSPHATES 15 MMOLE/5ML IV SOLN
30.0000 mmol | Freq: Once | INTRAVENOUS | Status: AC
  Administered 2021-10-03: 30 mmol via INTRAVENOUS
  Filled 2021-10-03: qty 10

## 2021-10-03 MED ORDER — PROPOFOL 1000 MG/100ML IV EMUL
5.0000 ug/kg/min | INTRAVENOUS | Status: DC
  Administered 2021-10-03: 40 ug/kg/min via INTRAVENOUS
  Administered 2021-10-03 (×2): 30 ug/kg/min via INTRAVENOUS
  Administered 2021-10-04 (×2): 40 ug/kg/min via INTRAVENOUS
  Filled 2021-10-03 (×7): qty 100

## 2021-10-03 MED ORDER — FENTANYL CITRATE (PF) 100 MCG/2ML IJ SOLN
INTRAMUSCULAR | Status: AC
  Administered 2021-10-03: 100 ug
  Filled 2021-10-03: qty 2

## 2021-10-03 MED ORDER — POLYETHYLENE GLYCOL 3350 17 G PO PACK
17.0000 g | PACK | Freq: Every day | ORAL | Status: DC
  Administered 2021-10-03 – 2021-10-12 (×9): 17 g
  Filled 2021-10-03 (×11): qty 1

## 2021-10-03 MED ORDER — ETOMIDATE 2 MG/ML IV SOLN
INTRAVENOUS | Status: AC
  Administered 2021-10-03: 20 mg
  Filled 2021-10-03: qty 10

## 2021-10-03 MED ORDER — TRAVASOL 10 % IV SOLN
INTRAVENOUS | Status: AC
  Filled 2021-10-03: qty 748.8

## 2021-10-03 NOTE — Consult Note (Signed)
PHARMACY - TOTAL PARENTERAL NUTRITION CONSULT NOTE   Indication: Prolonged ileus  Patient Measurements: Height: '5\' 11"'$  (180.3 cm) Weight: 103.9 kg (229 lb 0.9 oz) IBW/kg (Calculated) : 75.3   Body mass index is 31.95 kg/m.  Assessment:  Patient is a 71 y/o M with past medical history including CAD, ischemic cardiomyopathy, DM, HTN, CKD, OSA who presented to the ED 8/12 with abdominal pain, nausea, and vomiting. Imaging was concerning for mesenteric ischemia and patient was taken for emergent exploratory laparotomy with lysis of adhesions and no mesenteric ischemia noted. Patient was extubated 8/13. Post-operative course complicated by delirium, ongoing ileus. Given overall clinical picture, patient was re-intubated 8/15 and PICC was placed to start TPN 8/15.   Glucose / Insulin: History of DM (last Hgb A1c 7.1%). Currently on moderate SSI q4h. Glucoses have been well-controlled. 7u SSI required last 24h Electrolytes: Hyperkalemia resolved with improvement in renal function. Hypophosphatemia Renal: AKI resolved. Scr 1.1 today Hepatic: Overall within normal parameters Intake / Output; MIVF: I&O: -645 mL. NS at 50 cc/hr GI Imaging: 8/12 CT abdomen / pelvis: Concerning for acute mesenteric ischemia. Extensive pneumatosis within several loops of small bowel within the central abdomen with mucosal hypoenhancement. Extensive mesenteric and portal venous gas. Small volume pneumoperitoneum.Multiple dilated loops of small bowel without abrupt transition point. It is uncertain if this is secondary to bowel ischemia or mechanical obstruction. 8/13 CT abdomen / pelvis: Resolution of previously noted pneumatosis and portal venous gas portal venous gas. There are some persistent areas of small-bowel dilatation, with multiple ill-defined loops of small bowel and multiple air-fluid levels. This may simply represent a mild postoperative ileus, although persistent partial small bowel obstruction is not  entirely excluded. GI Surgeries / Procedures:  8/12: ExLap with lysis of adhesions. No mesenteric ischemia found  Central access: 8/15 TPN start date: 8/15  Nutritional Goals: Goal TPN rate is 80 mL/hr (provides 150 g of protein and 1374 kcals per day)  RD Assessment: Estimated Needs Total Energy Estimated Needs: 1142-1454kcal/day Total Protein Estimated Needs: 150-160g/day Total Fluid Estimated Needs: 2.0L/day  Current Nutrition:  NPO  Propofol last charted at 40 mcg/kg/min (24.9 mL/hr = 657 kCal/day from fat)  Plan:  --Start TPN at 40 mL/hr (1/2 rate) at 1800 Lipids currently withheld from TPN given on propofol as above Expected content at full rate: *Anticipate minor logistical changes* Protein: 150 g Glucose: 115 g Lipids: 38.4 g Kcal: 1374 --Electrolytes in TPN: Na 11mq/L, K 572m/L, Ca 81m73mL, Mg 81mE30m, and Phos 181mm66m. Cl:Ac 1:1 Add standard MVI and trace elements to TPN Thiamine 100 mg x 3 days (day # 1) Magnesium sulfate 2 g IV x 1 Potassium phosphate 30 mmol x 1 --Continue Moderate q4h SSI and adjust as needed  --Discontinue MIVF at 1800 --Monitor TPN labs on Mon/Thurs, daily until stable  Eder Macek Benita Gutter/2023,10:08 AM

## 2021-10-03 NOTE — Progress Notes (Signed)
Peripherally Inserted Central Catheter Placement  The IV Nurse has discussed with the patient and/or persons authorized to consent for the patient, the purpose of this procedure and the potential benefits and risks involved with this procedure.  The benefits include less needle sticks, lab draws from the catheter, and the patient may be discharged home with the catheter. Risks include, but not limited to, infection, bleeding, blood clot (thrombus formation), and puncture of an artery; nerve damage and irregular heartbeat and possibility to perform a PICC exchange if needed/ordered by physician.  Alternatives to this procedure were also discussed.  Bard Power PICC patient education guide, fact sheet on infection prevention and patient information card has been provided to patient /or left at bedside.    PICC Placement Documentation  PICC Triple Lumen 63/81/77 Right Basilic 42 cm 0 cm (Active)  Indication for Insertion or Continuance of Line Administration of hyperosmolar/irritating solutions (i.e. TPN, Vancomycin, etc.) 10/03/21 1201  Exposed Catheter (cm) 0 cm 10/03/21 1201  Site Assessment Clean, Dry, Intact 10/03/21 1201  Lumen #1 Status Flushed;Saline locked;Blood return noted 10/03/21 1201  Lumen #2 Status Flushed;Saline locked;Blood return noted 10/03/21 1201  Lumen #3 Status Flushed;Saline locked;Blood return noted 10/03/21 1201  Dressing Type Transparent;Securing device 10/03/21 1201  Dressing Status Antimicrobial disc in place 10/03/21 [place] Not Applicable 11/65/79 0383  Dressing Change Due 10/10/21 10/03/21 1201       Frances Maywood 10/03/2021, 12:04 PM

## 2021-10-03 NOTE — Progress Notes (Signed)
[facility] Day(s): 3.   Post op day(s): 3 Days Post-Op.   Interval History:  Patient seen and examined No acute events or new complaints overnight.  Pulled NGT overnight Off BiPAP Labs pending this morning; awaiting draw NGT clamped yesterday; passed Initiated trickle feeds but stopped once NGT pulled He does have stool in rectal tube  Vital signs in last 24 hours: [min-max] current  Temp:  [98.3 F (36.8 C)-99.7 F (37.6 C)] 98.8 F (37.1 C) (08/15 0400) Pulse Rate:  [53-111] 100 (08/15 0600) Resp:  [11-19] 18 (08/15 0600) BP: (99-156)/(50-84) 156/70 (08/15 0600) SpO2:  [89 %-97 %] 94 % (08/15 0600) Arterial Line BP: (84-133)/(30-88) 131/88 (08/15 0600) Weight:  [103.9 kg] 103.9 kg (08/15 0500)     Height: '5\' 11"'$  (180.3 cm) Weight: 103.9 kg BMI (Calculated): 31.96   Intake/Output last 2 shifts:  08/14 0701 - 08/15 0700 In: 889.9 [I.V.:803.9; NG/GT:16.5; IV Piggyback:69.5] Out: 3600 [Urine:3600]   Physical Exam:  Constitutional: alert, cooperative Respiratory: On [place]; no respiratory distress Cardiovascular: tachycardic and sinus rhythm  Gastrointestinal: Soft, incisional soreness, he is markedly distended Genitourinary: Foley in place; clear urine. Rectal tube present; stool in bag Integumentary: Laparotomy is CDI with staples and honeycomb; no erythema, no drainage  Labs:     Latest Ref Rng & Units 10/02/2021    3:34 AM 10/01/2021    5:00 AM 10/17/2021   11:55 PM  CBC  WBC 4.0 - 10.5 K/uL 8.2  12.4  7.8   Hemoglobin 13.0 - 17.0 g/dL 12.2  14.4  14.4   Hematocrit 39.0 - 52.0 % 37.1  45.1  44.0   Platelets 150 - 400 K/uL 96  151  105       Latest Ref Rng & Units 10/02/2021    3:34 AM 10/01/2021    5:32 PM 10/01/2021    1:40 PM  CMP  Glucose 70 - 99 mg/dL 185     BUN 8 - 23 mg/dL 58     Creatinine 0.61 - 1.24 mg/dL 1.84     Sodium 135 - 145 mmol/L 137     Potassium 3.5 - 5.1 mmol/L 5.0  4.3  4.7   Chloride 98 -  111 mmol/L 105     CO2 22 - 32 mmol/L 24     Calcium 8.9 - 10.3 mg/dL 7.6        Imaging studies:   KUB (10/03/2021) personally reviewed showing dilated loops of small bowel consistent with ileus, and radiologist report reviewed below:  IMPRESSION: Gas-filled loops of bowel throughout the abdomen and pelvis. Findings are suggestive for an ileus pattern. Bowel gas distension may have slightly progressed.   Assessment/Plan:  71 y.o. male with anticipated post-operative ileus 3 Days Post-Op s/p exploratory laparotomy and LOA for CT findings concerning for SBO, pneumatosis, and pneumoperitoneum   - Recommend replacing NGT given abdominal distension and UB findings; I do not think he will tolerate TF. Recommend considering TPN given lack of enteric use   - Foley catheter per primary service; good UO - Monitor abdominal examination; on-going bowel function   - Can consider serial KUBs   - Monitor leukocytosis; pending  - Monitor renal function; pending  - OOB once feasible and more alert   - Further management per primary service; we will of course follow allong  All of the above findings and recommendations were discussed with the patient and the medical team.  -- Edison Simon, PA-C [facility] 10/03/2021, 7:35 AM M-F: 7am - 4pm
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
## 2021-10-03 NOTE — Progress Notes (Addendum)
ART line loose and partially dislodged on assessment. NP Nelson aware and advised me to remove. This RN removed and applied pressure to ensure no excess bleeding. Gauze and tape applied to site without complications.
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
## 2021-10-03 NOTE — Procedures (Signed)
Endotracheal Intubation: Patient required placement of an artificial airway secondary to unresponsive state   Consent: Emergent.    Hand washing performed prior to starting the procedure.    Medications administered for sedation prior to procedure:  Fentanyl 100 mcg IV, 20 mg Etomidate IV, Versed 2 mg IV, Rocuronium 100 mg IV     A time out procedure was called and correct patient, name, & ID confirmed. Needed supplies and equipment were assembled and checked to include ETT, 10 ml syringe, Glidescope, Mac and Miller blades, suction, oxygen and bag mask valve, end tidal CO2 monitor.    Patient was positioned to align the mouth and pharynx to facilitate visualization of the glottis.    Heart rate, SpO2 and blood pressure was continuously monitored during the procedure. Pre-oxygenation was conducted prior to intubation and endotracheal tube was placed through the vocal cords into the trachea.       The artificial airway was placed under direct visualization via glidescope route using a 8 cm ETT on the first attempt.   ETT was secured at 24 cm.   Placement was confirmed by auscuitation of lungs with good breath sounds bilaterally and no stomach sounds.  Condensation was noted on endotracheal tube.   Pulse ox 96%  CO2 detector in place with appropriate color change.    Complications: None .        Chest radiograph ordered and pending.   Donell Beers, [place] Pager [phone] (please enter 7 digits) PCCM Consult Pager [phone] (please enter 7 digits)

## 2021-10-03 NOTE — Progress Notes (Signed)
NG tube not present on morning assessment by this RN. Night shift RN Johann Capers Reported to me that patient removed NG tube around 0400 this morning. MD Tamala Julian and NP Meda Coffee aware.

## 2021-10-04 ENCOUNTER — Inpatient Hospital Stay: Payer: Medicare HMO

## 2021-10-04 DIAGNOSIS — K559 Vascular disorder of intestine, unspecified: Secondary | ICD-10-CM

## 2021-10-04 LAB — COMPREHENSIVE METABOLIC PANEL
ALT: 22 U/L (ref 0–44)
AST: 17 U/L (ref 15–41)
Albumin: 2.4 g/dL — ABNORMAL LOW (ref 3.5–5.0)
Alkaline Phosphatase: 38 U/L (ref 38–126)
Anion gap: 5 (ref 5–15)
BUN: 56 mg/dL — ABNORMAL HIGH (ref 8–23)
CO2: 26 mmol/L (ref 22–32)
Calcium: 8.2 mg/dL — ABNORMAL LOW (ref 8.9–10.3)
Chloride: 106 mmol/L (ref 98–111)
Creatinine, Ser: 1.46 mg/dL — ABNORMAL HIGH (ref 0.61–1.24)
GFR, Estimated: 51 mL/min — ABNORMAL LOW (ref >60)
Glucose, Bld: 181 mg/dL — ABNORMAL HIGH (ref 70–99)
Potassium: 4.2 mmol/L (ref 3.5–5.1)
Sodium: 137 mmol/L (ref 135–145)
Total Bilirubin: 0.7 mg/dL (ref 0.3–1.2)
Total Protein: 5.9 g/dL — ABNORMAL LOW (ref 6.5–8.1)

## 2021-10-04 LAB — PHOSPHORUS: Phosphorus: 3.2 mg/dL (ref 2.5–4.6)

## 2021-10-04 LAB — CBC WITH DIFFERENTIAL/PLATELET
Abs Immature Granulocytes: 0.09 10*3/uL — ABNORMAL HIGH (ref 0.00–0.07)
Basophils Absolute: 0 10*3/uL (ref 0.0–0.1)
Basophils Relative: 1 %
Eosinophils Absolute: 0.2 10*3/uL (ref 0.0–0.5)
Eosinophils Relative: 3 %
HCT: 32.6 % — ABNORMAL LOW (ref 39.0–52.0)
Hemoglobin: 10.5 g/dL — ABNORMAL LOW (ref 13.0–17.0)
Immature Granulocytes: 2 %
Lymphocytes Relative: 14 %
Lymphs Abs: 0.8 10*3/uL (ref 0.7–4.0)
MCH: 30.1 pg (ref 26.0–34.0)
MCHC: 32.2 g/dL (ref 30.0–36.0)
MCV: 93.4 fL (ref 80.0–100.0)
Monocytes Absolute: 0.8 10*3/uL (ref 0.1–1.0)
Monocytes Relative: 14 %
Neutro Abs: 3.9 10*3/uL (ref 1.7–7.7)
Neutrophils Relative %: 66 %
Platelets: 98 10*3/uL — ABNORMAL LOW (ref 150–400)
RBC: 3.49 MIL/uL — ABNORMAL LOW (ref 4.22–5.81)
RDW: 14.9 % (ref 11.5–15.5)
WBC: 5.9 10*3/uL (ref 4.0–10.5)
nRBC: 0 % (ref 0.0–0.2)

## 2021-10-04 LAB — MAGNESIUM: Magnesium: 2.4 mg/dL (ref 1.7–2.4)

## 2021-10-04 LAB — GLUCOSE, CAPILLARY
Glucose-Capillary: 180 mg/dL — ABNORMAL HIGH (ref 70–99)
Glucose-Capillary: 170 mg/dL — ABNORMAL HIGH (ref 70–99)
Glucose-Capillary: 181 mg/dL — ABNORMAL HIGH (ref 70–99)
Glucose-Capillary: 206 mg/dL — ABNORMAL HIGH (ref 70–99)
Glucose-Capillary: 172 mg/dL — ABNORMAL HIGH (ref 70–99)
Glucose-Capillary: 195 mg/dL — ABNORMAL HIGH (ref 70–99)

## 2021-10-04 LAB — UREA NITROGEN, URINE: Urea Nitrogen, Ur: 198 mg/dL

## 2021-10-04 LAB — TRIGLYCERIDES: Triglycerides: 1175 mg/dL — ABNORMAL HIGH (ref <150)

## 2021-10-04 MED ORDER — FENTANYL CITRATE PF 50 MCG/ML IJ SOSY: 50.0000 ug | PREFILLED_SYRINGE | INTRAMUSCULAR | Status: DC | PRN

## 2021-10-04 MED ORDER — DEXMEDETOMIDINE HCL IN NACL 400 MCG/100ML IV SOLN
0.4000 ug/kg/h | INTRAVENOUS | Status: DC
  Administered 2021-10-04 (×2): 1.2 ug/kg/h via INTRAVENOUS
  Filled 2021-10-04 (×3): qty 100

## 2021-10-04 MED ORDER — NOREPINEPHRINE 4 MG/250ML-% IV SOLN
0.0000 ug/min | INTRAVENOUS | Status: DC
  Administered 2021-10-04: 10 ug/min via INTRAVENOUS
  Filled 2021-10-04: qty 250

## 2021-10-04 MED ORDER — TRAVASOL 10 % IV SOLN
INTRAVENOUS | Status: AC
  Filled 2021-10-04: qty 1497.6

## 2021-10-04 MED ORDER — MIDAZOLAM-SODIUM CHLORIDE 100-0.9 MG/100ML-% IV SOLN
0.5000 mg/h | INTRAVENOUS | Status: DC
  Administered 2021-10-04: 5 mg/h via INTRAVENOUS
  Administered 2021-10-05 – 2021-10-06 (×3): 9 mg/h via INTRAVENOUS
  Administered 2021-10-06: 7 mg/h via INTRAVENOUS
  Administered 2021-10-07: 9 mg/h via INTRAVENOUS
  Administered 2021-10-07: 8 mg/h via INTRAVENOUS
  Administered 2021-10-08: 10 mg/h via INTRAVENOUS
  Administered 2021-10-08: 8 mg/h via INTRAVENOUS
  Administered 2021-10-09 (×2): 10 mg/h via INTRAVENOUS
  Administered 2021-10-09: 8 mg/h via INTRAVENOUS
  Filled 2021-10-04 (×12): qty 100

## 2021-10-04 NOTE — Progress Notes (Signed)
[facility] Day(s): 4.   Post op day(s): 4 Days Post-Op.   Interval History:  Patient seen and examined Patient re-intubated yesterday and OG placed for decompression Hemodynamically stable this morning; no need for vasopressor support  He remains without leukocytosis; 5.9K Hgb to 10.5 Increase in sCr to 1.46; UO - 1150 ccs No electrolyte derangements Hypertriglyceridemia to 1175 NGT with 250 ccs out; KUB still with ileus pattern No further bowel movements per bedside RN  Vital signs in last 24 hours: [min-max] current  Temp:  [98.5 F (36.9 C)-98.9 F (37.2 C)] 98.8 F (37.1 C) (08/16 0400) Pulse Rate:  [69-109] 85 (08/16 0600) Resp:  [12-18] 14 (08/16 0600) BP: (93-126)/(56-95) 94/60 (08/16 0600) SpO2:  [87 %-99 %] 87 % (08/16 0600) Arterial Line BP: (156-198)/(120-139) 164/133 (08/15 1000) FiO2 (%):  [50 %-70 %] 50 % (08/16 0316) Weight:  [102.8 kg] 102.8 kg (08/16 0500)     Height: '5\' 11"'$  (180.3 cm) Weight: 102.8 kg BMI (Calculated): 31.62   Intake/Output last 2 shifts:  08/15 0701 - 08/16 0700 In: 2699 [I.V.:1838.3; NG/GT:236.2; IV Piggyback:624.6] Out: 1400 [Urine:1150; Emesis/NG output:250]   Physical Exam:  Constitutional: Sedated, intubated  Respiratory: On ventilator  Cardiovascular: regular rate and sinus rhythm  Gastrointestinal: Soft, distended. Unable to assess tenderness  Genitourinary: Foley in place; clear urine.  Integumentary: Laparotomy is CDI with staples and honeycomb; no erythema, no drainage  Labs:     Latest Ref Rng & Units 10/04/2021    5:40 AM 10/03/2021    9:05 AM 10/02/2021    3:34 AM  CBC  WBC 4.0 - 10.5 K/uL 5.9  8.2  8.2   Hemoglobin 13.0 - 17.0 g/dL 10.5  11.4  12.2   Hematocrit 39.0 - 52.0 % 32.6  34.9  37.1   Platelets 150 - 400 K/uL 98  85  96       Latest Ref Rng & Units 10/04/2021    5:40 AM 10/03/2021    9:05 AM 10/02/2021    3:34 AM  CMP  Glucose 70 - 99 mg/dL 181  126   185   BUN 8 - 23 mg/dL 56  39  58   Creatinine 0.61 - 1.24 mg/dL 1.46  1.10  1.84   Sodium 135 - 145 mmol/L 137  138  137   Potassium 3.5 - 5.1 mmol/L 4.2  3.9  5.0   Chloride 98 - 111 mmol/L 106  109  105   CO2 22 - 32 mmol/L '26  22  24   '$ Calcium 8.9 - 10.3 mg/dL 8.2  8.0  7.6   Total Protein 6.5 - 8.1 g/dL 5.9     Total Bilirubin 0.3 - 1.2 mg/dL 0.7     Alkaline Phos 38 - 126 U/L 38     AST 15 - 41 U/L 17     ALT 0 - 44 U/L 22        Imaging studies:   KUB (10/04/2021) personally reviewed showing persistent dilation of small bowel, gas in colon, likely progressing ileus, and radiologist report reviewed below:  IMPRESSION: Gaseous distension of the small bowel loops compatible with postoperative ileus.   Assessment/Plan:  71 y.o. male with anticipated post-operative ileus 4 Days Post-Op s/p exploratory laparotomy and LOA for CT findings concerning for SBO, pneumatosis, and pneumoperitoneum   - Continue OGT decompression today; LIS; monitor and record output  - Continue TPN for now; advance to goal when feasible. If  OGT output remains low and he has improvement in KUB or significant return of bowel function, we can consider returning to enteric feeds  - Foley catheter per primary service; good UO - Monitor abdominal examination; on-going bowel function   - Can consider serial KUBs prn  - Monitor leukocytosis; resolved  - Monitor renal function  - OOB once feasible and more alert   - Further management per primary service; we will of course follow allong  All of the above findings and recommendations were discussed with the medical team, family present at bedside.   -- Edison Simon, PA-C [facility] 10/04/2021, 7:46 AM M-F: 7am - 4pm

## 2021-10-04 NOTE — Progress Notes (Signed)
Per verbal order from NP Dana , OG tube advanced to 3 from 25. Secured at lip with tape.

## 2021-10-04 NOTE — Progress Notes (Signed)
NAME:  Todd Spencer, MRN:  [id number], DOB:  April 07, 1950, LOS: 4 ADMISSION DATE:  09/23/2021, CONSULTATION DATE: 10/06/2021  History of Present Illness:  Todd Spencer is a 71 year old male with a history of CAD, ischemic cardiomyopathy, type 2 diabetes, HTN, CKD, and OSA who presented to the ED with abdominal pain, nausea and vomiting. He endorsed diffuse abdominal pain the ED team and underwent a CT scan of the abdomen was performed showing portal venous and mesenteric gas, highly concerning for mesenteric ischemia. He was started on antibiotics and taken emergently to the OR by Dr. Christian Mate for an exploratory laparotomy. History was mostly obtained by chart review as the patient was transferred to the ICU intubated following his procedure.   Dr. Christian Mate noted multiple adhesions in the abdomen which were excised and lysed. A mild non-compromised transition area was noted, with the small bowel appearing healthy and well perfused, with no signs of discoloration pneumatosis or perforation. Similarly, the colon was healthy appearing. Following the procedure, he was transferred to the ICU for gradual extubation and further care.  Pertinent  Medical History  CAD s/p stenting to his RCA and LAD HFrEF, ischemic cardiomyopathy HTN HLD T2DM OSA on CPAP  Significant Hospital Events: Including procedures, antibiotic start and stop dates in addition to other pertinent events   10/05/2021: Exploratory laparotomy with lysis of adhesions, no       mesenteric ischemia noted 10/19/2021: Overnight pt with 450 ml of bloody OGT concerning       for possible GIB and worsening hyperkalemia  09/22/2021: Pt extubated to [place]  10/01/2021: CT Chest/Abd/Pelvis revealed Resolution of previously noted pneumatosis and portal venous gas portal venous gas. There are some persistent areas of small-bowel dilatation, with multiple ill-defined loops of small bowel and multiple air-fluid levels. This may simply represent a  mild postoperative ileus, although persistent partial small bowel obstruction is not entirely excluded. Trace volume of ascites. Dependent opacities in the lung bases bilaterally favored to predominantly reflect areas of subsegmental atelectasis and/or chronic scarring, although a small amount of airspace consolidation is not excluded in the posterior aspect of the right lower lobe. Aortic atherosclerosis, in addition to left main and three-vessel coronary artery disease. Assessment for potential risk factor modification, dietary therapy or pharmacologic therapy may be warranted, if clinically indicated. Additional incidental findings, as above. 10/02/2021: Pt developed worsening delirium/agitation overnight requiring precedex gtt.  Pt lethargic this am but able to follow commands with no signs of agitation.  Will attempt to wean off Bipap to HFNC once mentation improves.  Minimal NGT output overnight  10/03/2021- Trickle feeds initiated on 08/14 '@10'$  ml/hr.  However, overnight he pulled the NGT out.  This am he is having worsening abdominal distension and pain. KUB revealed ileus patter with slightly progressing bowel gas pattern.  Orders placed to reinsert NGT once placement confirmed will place to LIS.  Intubated to allow time for ileus to resolve (see discussion 10/03/21 note)  Interim History / Subjective:  No events, NGT output down to 50cc overnight shift.  Started on TPN  Objective   Blood pressure 94/60, pulse 85, temperature 98.8 F (37.1 C), resp. rate 14, height '5\' 11"'$  (1.803 m), weight 102.8 kg, SpO2 (!) 87 %.    Vent Mode: PRVC FiO2 (%):  [50 %-70 %] 50 % Set Rate:  [14 bmp] 14 bmp Vt Set:  [550 mL] 550 mL PEEP:  [10 cmH20] 10 cmH20 Plateau Pressure:  [13 cmH20-22 cmH20] 22 cmH20  Intake/Output Summary (Last 24 hours) at 10/04/2021 0734 Last data filed at 10/04/2021 0600 Gross per 24 hour  Intake 2699.04 ml  Output 1400 ml  Net 1299.04 ml    Filed Weights   10/02/21 0403 10/03/21  0500 10/04/21 0500  Weight: 109.6 kg 103.9 kg 102.8 kg    Examination: Sedated on vent Lungs clear, triggers vent Opens eyes to voice Ext warm Abdomen remains protuberant, tympanic to percussion, hypoactive BS Ex lap incision site CDI Brown fluid from OGT  Renal function slightly down again Thrombocytopenia stable H/H drifting down  Resolved Hospital Problem list     Assessment & Plan:  SBO with small bowel ischemia- s/p ex lap LOA 8/12 Postoperative ileus- ongoing Postoperative delirium- with background of alzheimer's and vascular dementia Postoperative hypoxemic respiratory failure- due risk of aspiration (see discussion yesterday), decision made to keep on mechanical ventilation until return of bowel function. Postoperative AKI- a little worse today, will keep an eye on Postoperative anemia, thrombocytopenia- stable, mild monitor  - Post op ileus, NG activity, and incision management per general surgery - Continue vent bundle, await return of bowel function - Continue TPN - Propofol for sedation, try to avoid opiates  Best Practice (right click and "Reselect all SmartList Selections" daily)   Diet/type: TPN DVT prophylaxis: prophylactic heparin  GI prophylaxis: PPI Lines: Arterial Line Foley:  Yes, and it is still needed Code Status:  full code Last date of multidisciplinary goals of care discussion [10/03/2021]  33 min cc time Erskine Emery MD PCCM

## 2021-10-04 NOTE — Progress Notes (Addendum)
Notified mD Smith patients BP 80/49 MAP 60 and bradycardic at 50. Precedex off, fent at 100 and versed at 7 at this time.  New orders placed for Levo and administered by this RN as documented in [facility].

## 2021-10-04 NOTE — Progress Notes (Signed)
TG is now pretty high. Will transition to precedex + PRN fent.  Erskine Emery MD PCCM

## 2021-10-04 NOTE — Progress Notes (Signed)
Patient BP has remained 90s/50s with MAP 61-65 since 0930 this morning. MD Tamala Julian made aware via secure chat and responded his awareness of situation. No new orders at this time. Will continue to monitor closely.

## 2021-10-04 NOTE — Consult Note (Signed)
PHARMACY - TOTAL PARENTERAL NUTRITION CONSULT NOTE   Indication: Prolonged ileus  Patient Measurements: Height: '5\' 11"'$  (180.3 cm) Weight: 102.8 kg (226 lb 10.1 oz) IBW/kg (Calculated) : 75.3   Body mass index is 31.61 kg/m.  Assessment:  Patient is a 71 y/o M with past medical history including CAD, ischemic cardiomyopathy, DM, HTN, CKD, OSA who presented to the ED 8/12 with abdominal pain, nausea, and vomiting. Imaging was concerning for mesenteric ischemia and patient was taken for emergent exploratory laparotomy with lysis of adhesions and no mesenteric ischemia noted. Patient was extubated 8/13. Post-operative course complicated by delirium, ongoing ileus. Given overall clinical picture, patient was re-intubated 8/15 and PICC was placed to start TPN 8/15.   Glucose / Insulin: History of DM (last Hgb A1c 7.1%). BG last 24h: 118 - 206. Currently on moderate SSI q4h. 14u SSI required last 24h Electrolytes: Within normal limits Renal: Scr trending up (1.1 >> 1.46) Hepatic: No transaminitis. LFTs normal Intake / Output; MIVF: I&O: + 1.3L. NS at 50 cc/hr GI Imaging: 8/12 CT abdomen / pelvis: Concerning for acute mesenteric ischemia. Extensive pneumatosis within several loops of small bowel within the central abdomen with mucosal hypoenhancement. Extensive mesenteric and portal venous gas. Small volume pneumoperitoneum.Multiple dilated loops of small bowel without abrupt transition point. It is uncertain if this is secondary to bowel ischemia or mechanical obstruction. 8/13 CT abdomen / pelvis: Resolution of previously noted pneumatosis and portal venous gas portal venous gas. There are some persistent areas of small-bowel dilatation, with multiple ill-defined loops of small bowel and multiple air-fluid levels. This may simply represent a mild postoperative ileus, although persistent partial small bowel obstruction is not entirely excluded. GI Surgeries / Procedures:  8/12: ExLap with lysis  of adhesions. No mesenteric ischemia found  Central access: 8/15 TPN start date: 8/15  Nutritional Goals: Goal TPN rate is 80 mL/hr (provides 150 g of protein and 1374 kcals per day)  RD Assessment: Estimated Needs Total Energy Estimated Needs: 1142-1454kcal/day Total Protein Estimated Needs: 150-160g/day Total Fluid Estimated Needs: 2.0L/day  Current Nutrition:  NPO  Propofol discontinued 8/16 secondary to hypertriglyceridemia  Plan:  --Advance TPN to 80 mL/hr (full rate) at 1800 Lipids currently withheld from TPN given hypertriglyceridemia, re-check tomorrow Expected content at full rate: *Anticipate minor logistical changes* Protein: 150 g Glucose: 115 g Lipids: 38.4 g (currently held) Kcal: 1374 (990 with lipids held) --Electrolytes in TPN: Na 78mq/L, K 560m/L, Ca 63m59mL, Mg 63mE34m, and Phos 163mm44m. Cl:Ac 1:1 Add standard MVI and trace elements to TPN Thiamine 100 mg x 3 days (day # 2) --Continue Moderate q4h SSI and adjust as needed  --Discontinue MIVF at 1800 --Monitor TPN labs on Mon/Thurs, daily until stable  Sarina Robleto B ChappDonney Rankins/2023,7:51 AM

## 2021-10-05 DIAGNOSIS — K55059 Acute (reversible) ischemia of intestine, part and extent unspecified: Secondary | ICD-10-CM | POA: Diagnosis not present

## 2021-10-05 LAB — GLUCOSE, CAPILLARY
Glucose-Capillary: 189 mg/dL — ABNORMAL HIGH (ref 70–99)
Glucose-Capillary: 210 mg/dL — ABNORMAL HIGH (ref 70–99)
Glucose-Capillary: 214 mg/dL — ABNORMAL HIGH (ref 70–99)
Glucose-Capillary: 218 mg/dL — ABNORMAL HIGH (ref 70–99)
Glucose-Capillary: 222 mg/dL — ABNORMAL HIGH (ref 70–99)
Glucose-Capillary: 243 mg/dL — ABNORMAL HIGH (ref 70–99)

## 2021-10-05 LAB — CBC WITH DIFFERENTIAL/PLATELET
Abs Immature Granulocytes: 0.22 10*3/uL — ABNORMAL HIGH (ref 0.00–0.07)
Basophils Absolute: 0 10*3/uL (ref 0.0–0.1)
Basophils Relative: 1 %
Eosinophils Absolute: 0.1 10*3/uL (ref 0.0–0.5)
Eosinophils Relative: 2 %
HCT: 31.9 % — ABNORMAL LOW (ref 39.0–52.0)
Hemoglobin: 10.2 g/dL — ABNORMAL LOW (ref 13.0–17.0)
Immature Granulocytes: 4 %
Lymphocytes Relative: 10 %
Lymphs Abs: 0.6 10*3/uL — ABNORMAL LOW (ref 0.7–4.0)
MCH: 29.7 pg (ref 26.0–34.0)
MCHC: 32 g/dL (ref 30.0–36.0)
MCV: 92.7 fL (ref 80.0–100.0)
Monocytes Absolute: 0.7 10*3/uL (ref 0.1–1.0)
Monocytes Relative: 14 %
Neutro Abs: 3.8 10*3/uL (ref 1.7–7.7)
Neutrophils Relative %: 69 %
Platelets: 91 10*3/uL — ABNORMAL LOW (ref 150–400)
RBC: 3.44 MIL/uL — ABNORMAL LOW (ref 4.22–5.81)
RDW: 15.1 % (ref 11.5–15.5)
WBC: 5.4 10*3/uL (ref 4.0–10.5)
nRBC: 0 % (ref 0.0–0.2)

## 2021-10-05 LAB — COMPREHENSIVE METABOLIC PANEL
ALT: 20 U/L (ref 0–44)
AST: 14 U/L — ABNORMAL LOW (ref 15–41)
Albumin: 2.4 g/dL — ABNORMAL LOW (ref 3.5–5.0)
Alkaline Phosphatase: 39 U/L (ref 38–126)
Anion gap: 5 (ref 5–15)
BUN: 58 mg/dL — ABNORMAL HIGH (ref 8–23)
CO2: 25 mmol/L (ref 22–32)
Calcium: 8.5 mg/dL — ABNORMAL LOW (ref 8.9–10.3)
Chloride: 108 mmol/L (ref 98–111)
Creatinine, Ser: 1.31 mg/dL — ABNORMAL HIGH (ref 0.61–1.24)
GFR, Estimated: 58 mL/min — ABNORMAL LOW (ref >60)
Glucose, Bld: 217 mg/dL — ABNORMAL HIGH (ref 70–99)
Potassium: 4.7 mmol/L (ref 3.5–5.1)
Sodium: 138 mmol/L (ref 135–145)
Total Bilirubin: 0.6 mg/dL (ref 0.3–1.2)
Total Protein: 6.1 g/dL — ABNORMAL LOW (ref 6.5–8.1)

## 2021-10-05 LAB — TRIGLYCERIDES: Triglycerides: 921 mg/dL — ABNORMAL HIGH (ref <150)

## 2021-10-05 LAB — MAGNESIUM: Magnesium: 2.2 mg/dL (ref 1.7–2.4)

## 2021-10-05 LAB — PHOSPHORUS: Phosphorus: 3.4 mg/dL (ref 2.5–4.6)

## 2021-10-05 MED ORDER — TRAVASOL 10 % IV SOLN
INTRAVENOUS | Status: AC
  Filled 2021-10-05: qty 1497.6

## 2021-10-05 NOTE — Progress Notes (Signed)
NAME:  AVEREY TROMPETER, MRN:  [id number], DOB:  06-23-1950, LOS: 5 ADMISSION DATE:  10/08/2021, CONSULTATION DATE: 09/28/2021  History of Present Illness:  Corrie Brannen is a 71 year old male with a history of CAD, ischemic cardiomyopathy, type 2 diabetes, HTN, CKD, and OSA who presented to the ED with abdominal pain, nausea and vomiting. He endorsed diffuse abdominal pain the ED team and underwent a CT scan of the abdomen was performed showing portal venous and mesenteric gas, highly concerning for mesenteric ischemia. He was started on antibiotics and taken emergently to the OR by Dr. Christian Mate for an exploratory laparotomy. History was mostly obtained by chart review as the patient was transferred to the ICU intubated following his procedure.   Dr. Christian Mate noted multiple adhesions in the abdomen which were excised and lysed. A mild non-compromised transition area was noted, with the small bowel appearing healthy and well perfused, with no signs of discoloration pneumatosis or perforation. Similarly, the colon was healthy appearing. Following the procedure, he was transferred to the ICU for gradual extubation and further care.  Pertinent  Medical History  CAD s/p stenting to his RCA and LAD HFrEF, ischemic cardiomyopathy HTN HLD T2DM OSA on CPAP  Significant Hospital Events: Including procedures, antibiotic start and stop dates in addition to other pertinent events   09/28/2021: Exploratory laparotomy with lysis of adhesions, no       mesenteric ischemia noted 09/20/2021: Overnight pt with 450 ml of bloody OGT concerning       for possible GIB and worsening hyperkalemia  09/20/2021: Pt extubated to [place]  10/01/2021: CT Chest/Abd/Pelvis revealed Resolution of previously noted pneumatosis and portal venous gas portal venous gas. There are some persistent areas of small-bowel dilatation, with multiple ill-defined loops of small bowel and multiple air-fluid levels. This may simply represent a  mild postoperative ileus, although persistent partial small bowel obstruction is not entirely excluded. Trace volume of ascites. Dependent opacities in the lung bases bilaterally favored to predominantly reflect areas of subsegmental atelectasis and/or chronic scarring, although a small amount of airspace consolidation is not excluded in the posterior aspect of the right lower lobe. Aortic atherosclerosis, in addition to left main and three-vessel coronary artery disease. Assessment for potential risk factor modification, dietary therapy or pharmacologic therapy may be warranted, if clinically indicated. Additional incidental findings, as above. 10/02/2021: Pt developed worsening delirium/agitation overnight requiring precedex gtt.  Pt lethargic this am but able to follow commands with no signs of agitation.  Will attempt to wean off Bipap to HFNC once mentation improves.  Minimal NGT output overnight  10/03/2021- Trickle feeds initiated on 08/14 '@10'$  ml/hr.  However, overnight he pulled the NGT out.  This am he is having worsening abdominal distension and pain. KUB revealed ileus patter with slightly progressing bowel gas pattern.  Orders placed to reinsert NGT once placement confirmed will place to LIS.  Intubated to allow time for ileus to resolve (see discussion 10/03/21 note)  Interim History / Subjective:  No events. Good light sedation with versed/fent. Still no Bms.  Objective   Blood pressure 115/62, pulse 99, temperature 100 F (37.8 C), temperature source Oral, resp. rate 12, height '5\' 11"'$  (1.803 m), weight 106.4 kg, SpO2 94 %.    Vent Mode: PRVC FiO2 (%):  [35 %-40 %] 35 % Set Rate:  [14 bmp] 14 bmp Vt Set:  [550 mL] 550 mL PEEP:  [5 cmH20-8 cmH20] 5 cmH20 Plateau Pressure:  [17 cmH20] 17 cmH20   Intake/Output  Summary (Last 24 hours) at 10/05/2021 0935 Last data filed at 10/05/2021 0804 Gross per 24 hour  Intake 2549.76 ml  Output 1850 ml  Net 699.76 ml    Filed Weights   10/03/21  0500 10/04/21 0500 10/05/21 0420  Weight: 103.9 kg 102.8 kg 106.4 kg    Examination: Sedated on vent Lungs clear, triggers vent Opens eyes to voice, follows commands Ext warm Abdomen remains protuberant, tympanic to percussion, hypoactive BS Ex lap incision site CDI Fair bit of air coming out of OGT  Renal function improved CBG up a bit CBC stable  Resolved Hospital Problem list     Assessment & Plan:  SBO with small bowel ischemia- s/p ex lap LOA 8/12 Postoperative ileus- ongoing Postoperative delirium- with background of alzheimer's and vascular dementia Postoperative hypoxemic respiratory failure- due risk of aspiration (see discussion 10/03/21), decision made to keep on mechanical ventilation until return of bowel function. Postoperative AKI- improved Postoperative anemia, thrombocytopenia- stable, mild monitor  - Post op ileus, NG activity, and incision management per general surgery - Continue vent bundle, await return of bowel function - Continue TPN - Versed/fent/precedex for sedation (TG too high for propofol.  Precedex or fent monotherapy are not enough. - Do not feel he is safe for extubation until has better return of bowel function due to loss of FRC leading to atelectasis as well as his delirium requiring sedation meds which predispose him to CO2 retention - PharmD to add insulin to TPN, appreciate help  Best Practice (right click and "Reselect all SmartList Selections" daily)   Diet/type: TPN DVT prophylaxis: prophylactic heparin  GI prophylaxis: PPI Lines: PICC Foley:  Yes, and it is still needed: keep for a few more days Code Status:  full code Last date of multidisciplinary goals of care discussion [10/03/2021]  31 min cc time Erskine Emery MD PCCM

## 2021-10-05 NOTE — Consult Note (Addendum)
PHARMACY - TOTAL PARENTERAL NUTRITION CONSULT NOTE   Indication: Prolonged ileus  Patient Measurements: Height: '5\' 11"'$  (180.3 cm) Weight: 106.4 kg (234 lb 9.1 oz) IBW/kg (Calculated) : 75.3   Body mass index is 32.72 kg/m.  Assessment:  Patient is a 71 y/o M with past medical history including CAD, ischemic cardiomyopathy, DM, HTN, CKD, OSA who presented to the ED 8/12 with abdominal pain, nausea, and vomiting. Imaging was concerning for mesenteric ischemia and patient was taken for emergent exploratory laparotomy with lysis of adhesions and no mesenteric ischemia noted. Patient was extubated 8/13. Post-operative course complicated by delirium, ongoing ileus. Given overall clinical picture, patient was re-intubated 8/15 and PICC was placed to start TPN 8/15.   Glucose / Insulin: History of DM (last Hgb A1c 7.1%). BG last 24h: 172 - 222. Currently on moderate SSI q4h. 22u SSI required last 24h Electrolytes: Within normal limits Renal: Scr stabilizing (1.1 >> 1.46 >> 1.31) Hepatic: No transaminitis. LFTs normal Intake / Output; MIVF: I&O: + 1.6L GI Imaging: 8/12 CT abdomen / pelvis: Concerning for acute mesenteric ischemia. Extensive pneumatosis within several loops of small bowel within the central abdomen with mucosal hypoenhancement. Extensive mesenteric and portal venous gas. Small volume pneumoperitoneum.Multiple dilated loops of small bowel without abrupt transition point. It is uncertain if this is secondary to bowel ischemia or mechanical obstruction. 8/13 CT abdomen / pelvis: Resolution of previously noted pneumatosis and portal venous gas portal venous gas. There are some persistent areas of small-bowel dilatation, with multiple ill-defined loops of small bowel and multiple air-fluid levels. This may simply represent a mild postoperative ileus, although persistent partial small bowel obstruction is not entirely excluded. GI Surgeries / Procedures:  8/12: ExLap with lysis of  adhesions. No mesenteric ischemia found  Central access: 8/15 TPN start date: 8/15  Nutritional Goals: Goal TPN rate is 80 mL/hr (provides 150 g of protein and 1374 kcals per day)  RD Assessment: Estimated Needs Total Energy Estimated Needs: 1142-1454kcal/day Total Protein Estimated Needs: 150-160g/day Total Fluid Estimated Needs: 2.0L/day  Current Nutrition:  NPO  Propofol discontinued 8/16 secondary to hypertriglyceridemia  Plan:  --Continue TPN to 80 mL/hr (full rate) at 1800 Lipids currently withheld from TPN given hypertriglyceridemia, continue to monitor daily for now, plan to add lipids once TG < 500 Expected content at full rate: *Anticipate minor logistical changes* Protein: 150 g Glucose: 115 g Lipids: 38.4 g (currently held) Kcal: 1374 (990 with lipids held) --Electrolytes in TPN: Na 39mq/L, K 468m/L, Ca 9m1mL, Mg 9mE72m, and Phos 19mm39m. Cl:Ac 1:1 Add standard MVI and trace elements to TPN Thiamine 100 mg x 3 days (day # 3) --Continue Moderate q4h SSI and adjust as needed  Add 10u insulin to TPN --Monitor TPN labs on Mon/Thurs, daily until stable  Shreya Lacasse Benita Gutter/2023,7:37 AM
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
## 2021-10-05 NOTE — Progress Notes (Signed)
[facility] Day(s): 5.   Post op day(s): 5 Days Post-Op.   Interval History:  Patient seen and examined Did have low BP yesterday requiring levophed; off it this morning  No other acute events He remains without leukocytosis; 5.4K; no true fever but T-max 100F Hgb to 10.2 Renal function improved some; sCr - 1.31; UO - 1600 ccs No electrolyte derangements Hypertriglyceridemia improved; 921 NGT with 400 ccs out No further bowel movements per bedside RN  Vital signs in last 24 hours: [min-max] current  Temp:  [98.6 F (37 C)-100 F (37.8 C)] 100 F (37.8 C) (08/17 0400) Pulse Rate:  [55-103] 99 (08/17 0600) Resp:  [13-15] 14 (08/17 0600) BP: (88-174)/(49-86) 122/66 (08/17 0600) SpO2:  [89 %-97 %] 93 % (08/17 0600) FiO2 (%):  [35 %-40 %] 35 % (08/17 0419) Weight:  [106.4 kg] 106.4 kg (08/17 0420)     Height: '5\' 11"'$  (180.3 cm) Weight: 106.4 kg BMI (Calculated): 32.73   Intake/Output last 2 shifts:  08/16 0701 - 08/17 0700 In: 2739.4 [I.V.:2495; NG/GT:150; IV Piggyback:84.4] Out: 2000 [Urine:1600; Emesis/NG output:400]   Physical Exam:  Constitutional: Sedated, intubated  HEENT: NGT in place; clearing bilious output  Respiratory: On ventilator  Cardiovascular: regular rate and sinus rhythm  Gastrointestinal: Soft, distended. Unable to assess tenderness  Genitourinary: Foley in place; clear urine.  Integumentary: Laparotomy is CDI with staples and honeycomb; no erythema, no drainage  Labs:     Latest Ref Rng & Units 10/05/2021    4:21 AM 10/04/2021    5:40 AM 10/03/2021    9:05 AM  CBC  WBC 4.0 - 10.5 K/uL 5.4  5.9  8.2   Hemoglobin 13.0 - 17.0 g/dL 10.2  10.5  11.4   Hematocrit 39.0 - 52.0 % 31.9  32.6  34.9   Platelets 150 - 400 K/uL 91  98  85       Latest Ref Rng & Units 10/05/2021    4:21 AM 10/04/2021    5:40 AM 10/03/2021    9:05 AM  CMP  Glucose 70 - 99 mg/dL 217  181  126   BUN 8 - 23 mg/dL 58  56  39    Creatinine 0.61 - 1.24 mg/dL 1.31  1.46  1.10   Sodium 135 - 145 mmol/L 138  137  138   Potassium 3.5 - 5.1 mmol/L 4.7  4.2  3.9   Chloride 98 - 111 mmol/L 108  106  109   CO2 22 - 32 mmol/L '25  26  22   '$ Calcium 8.9 - 10.3 mg/dL 8.5  8.2  8.0   Total Protein 6.5 - 8.1 g/dL 6.1  5.9    Total Bilirubin 0.3 - 1.2 mg/dL 0.6  0.7    Alkaline Phos 38 - 126 U/L 39  38    AST 15 - 41 U/L 14  17    ALT 0 - 44 U/L 20  22       Imaging studies: No new pertinent imaging studies   Assessment/Plan:  71 y.o. male with anticipated post-operative ileus 5 Days Post-Op s/p exploratory laparotomy and LOA for CT findings concerning for SBO, pneumatosis, and pneumoperitoneum   - Continue OGT decompression today; LIS; monitor and record output  - Continue TPN for now. If OGT output remains low and he has significant return of bowel function, we can consider returning to enteric feeds; not ready yet (08/17)  - Foley catheter per primary  service; good UO - Monitor abdominal examination; on-going bowel function   - Can consider serial KUBs prn  - Monitor renal function; improving  - OOB once feasible and more alert   - Further management per primary service; we will of course follow allong  All of the above findings and recommendations were discussed with the medical team, family present at bedside.   -- Edison Simon, PA-C [facility] 10/05/2021, 7:50 AM M-F: 7am - 4pm

## 2021-10-06 DIAGNOSIS — E1100 Type 2 diabetes mellitus with hyperosmolarity without nonketotic hyperglycemic-hyperosmolar coma (NKHHC): Secondary | ICD-10-CM

## 2021-10-06 DIAGNOSIS — K55059 Acute (reversible) ischemia of intestine, part and extent unspecified: Secondary | ICD-10-CM | POA: Diagnosis not present

## 2021-10-06 DIAGNOSIS — J9601 Acute respiratory failure with hypoxia: Secondary | ICD-10-CM | POA: Diagnosis not present

## 2021-10-06 DIAGNOSIS — R109 Unspecified abdominal pain: Secondary | ICD-10-CM | POA: Diagnosis not present

## 2021-10-06 DIAGNOSIS — Z794 Long term (current) use of insulin: Secondary | ICD-10-CM

## 2021-10-06 LAB — GLUCOSE, CAPILLARY
Glucose-Capillary: 215 mg/dL — ABNORMAL HIGH (ref 70–99)
Glucose-Capillary: 226 mg/dL — ABNORMAL HIGH (ref 70–99)
Glucose-Capillary: 227 mg/dL — ABNORMAL HIGH (ref 70–99)
Glucose-Capillary: 239 mg/dL — ABNORMAL HIGH (ref 70–99)
Glucose-Capillary: 242 mg/dL — ABNORMAL HIGH (ref 70–99)
Glucose-Capillary: 244 mg/dL — ABNORMAL HIGH (ref 70–99)
Glucose-Capillary: 262 mg/dL — ABNORMAL HIGH (ref 70–99)
Glucose-Capillary: 268 mg/dL — ABNORMAL HIGH (ref 70–99)
Glucose-Capillary: 302 mg/dL — ABNORMAL HIGH (ref 70–99)

## 2021-10-06 LAB — BASIC METABOLIC PANEL
Anion gap: 2 — ABNORMAL LOW (ref 5–15)
BUN: 62 mg/dL — ABNORMAL HIGH (ref 8–23)
CO2: 25 mmol/L (ref 22–32)
Calcium: 9 mg/dL (ref 8.9–10.3)
Chloride: 116 mmol/L — ABNORMAL HIGH (ref 98–111)
Creatinine, Ser: 1.28 mg/dL — ABNORMAL HIGH (ref 0.61–1.24)
GFR, Estimated: 59 mL/min — ABNORMAL LOW (ref >60)
Glucose, Bld: 272 mg/dL — ABNORMAL HIGH (ref 70–99)
Potassium: 5.2 mmol/L — ABNORMAL HIGH (ref 3.5–5.1)
Sodium: 143 mmol/L (ref 135–145)

## 2021-10-06 LAB — TRIGLYCERIDES: Triglycerides: 595 mg/dL — ABNORMAL HIGH (ref <150)

## 2021-10-06 MED ORDER — FREE WATER
30.0000 mL | Status: DC
  Administered 2021-10-06 – 2021-10-09 (×17): 30 mL

## 2021-10-06 MED ORDER — INSULIN ASPART 100 UNIT/ML IJ SOLN
0.0000 [IU] | INTRAMUSCULAR | Status: DC
  Administered 2021-10-06: 15 [IU] via SUBCUTANEOUS
  Administered 2021-10-06 (×3): 7 [IU] via SUBCUTANEOUS
  Administered 2021-10-07 (×2): 11 [IU] via SUBCUTANEOUS
  Administered 2021-10-07 (×3): 7 [IU] via SUBCUTANEOUS
  Administered 2021-10-08: 11 [IU] via SUBCUTANEOUS
  Administered 2021-10-08: 7 [IU] via SUBCUTANEOUS
  Administered 2021-10-08 (×2): 11 [IU] via SUBCUTANEOUS
  Administered 2021-10-08: 7 [IU] via SUBCUTANEOUS
  Administered 2021-10-08: 11 [IU] via SUBCUTANEOUS
  Administered 2021-10-08: 20 [IU] via SUBCUTANEOUS
  Administered 2021-10-09 (×2): 7 [IU] via SUBCUTANEOUS
  Administered 2021-10-09: 4 [IU] via SUBCUTANEOUS
  Administered 2021-10-09: 7 [IU] via SUBCUTANEOUS
  Administered 2021-10-09: 4 [IU] via SUBCUTANEOUS
  Administered 2021-10-09: 7 [IU] via SUBCUTANEOUS
  Administered 2021-10-10: 3 [IU] via SUBCUTANEOUS
  Administered 2021-10-10: 7 [IU] via SUBCUTANEOUS
  Administered 2021-10-10 – 2021-10-11 (×5): 4 [IU] via SUBCUTANEOUS
  Administered 2021-10-11: 7 [IU] via SUBCUTANEOUS
  Administered 2021-10-11: 4 [IU] via SUBCUTANEOUS
  Administered 2021-10-11 – 2021-10-12 (×3): 3 [IU] via SUBCUTANEOUS
  Administered 2021-10-12: 7 [IU] via SUBCUTANEOUS
  Administered 2021-10-12 (×3): 4 [IU] via SUBCUTANEOUS
  Administered 2021-10-12: 11 [IU] via SUBCUTANEOUS
  Administered 2021-10-13: 15 [IU] via SUBCUTANEOUS
  Administered 2021-10-13: 7 [IU] via SUBCUTANEOUS
  Administered 2021-10-13: 11 [IU] via SUBCUTANEOUS
  Administered 2021-10-13 (×2): 15 [IU] via SUBCUTANEOUS
  Filled 2021-10-06 (×45): qty 1

## 2021-10-06 MED ORDER — TRAVASOL 10 % IV SOLN
INTRAVENOUS | Status: AC
  Filled 2021-10-06: qty 1497.6

## 2021-10-06 MED ORDER — VITAL HIGH PROTEIN PO LIQD
1000.0000 mL | ORAL | Status: DC
  Administered 2021-10-06 – 2021-10-07 (×2): 1000 mL

## 2021-10-06 MED ORDER — INSULIN ASPART 100 UNIT/ML IV SOLN
10.0000 [IU] | Freq: Once | INTRAVENOUS | Status: AC
  Administered 2021-10-06: 10 [IU] via INTRAVENOUS
  Filled 2021-10-06: qty 0.1

## 2021-10-06 MED ORDER — INSULIN GLARGINE-YFGN 100 UNIT/ML ~~LOC~~ SOLN
10.0000 [IU] | Freq: Every day | SUBCUTANEOUS | Status: DC
  Administered 2021-10-06: 10 [IU] via SUBCUTANEOUS
  Filled 2021-10-06 (×2): qty 0.1

## 2021-10-06 MED ORDER — DEXTROSE 50 % IV SOLN
1.0000 | Freq: Once | INTRAVENOUS | Status: AC
  Administered 2021-10-06: 50 mL via INTRAVENOUS
  Filled 2021-10-06: qty 50

## 2021-10-06 MED ORDER — ENOXAPARIN SODIUM 40 MG/0.4ML IJ SOSY
40.0000 mg | PREFILLED_SYRINGE | INTRAMUSCULAR | Status: DC
  Administered 2021-10-06 – 2021-10-09 (×4): 40 mg via SUBCUTANEOUS
  Filled 2021-10-06 (×4): qty 0.4

## 2021-10-06 MED ORDER — INSULIN ASPART 100 UNIT/ML IJ SOLN
5.0000 [IU] | INTRAMUSCULAR | Status: DC
  Administered 2021-10-06 – 2021-10-07 (×6): 5 [IU] via SUBCUTANEOUS
  Filled 2021-10-06 (×6): qty 1

## 2021-10-06 NOTE — Inpatient Diabetes Management (Signed)
Inpatient Diabetes Program Recommendations  AACE/ADA: New Consensus Statement on Inpatient Glycemic Control   Target Ranges:  Prepandial:   less than 140 mg/dL      Peak postprandial:   less than 180 mg/dL (1-2 hours)      Critically ill patients:  140 - 180 mg/dL    Latest Reference Range & Units 10/06/21 04:32 10/06/21 08:09  Glucose-Capillary 70 - 99 mg/dL 239 (H) 244 (H)    Latest Reference Range & Units 10/05/21 07:29 10/05/21 11:07 10/05/21 15:08 10/05/21 20:41 10/05/21 23:58  Glucose-Capillary 70 - 99 mg/dL 214 (H) 243 (H) 218 (H) 210 (H) 215 (H)   Review of Glycemic Control  Diabetes history: DM2 Outpatient Diabetes medications: Lantus 100 units QHS, Regular 50 units TID with meals, Metformin XR 1500 mg QPM Current orders for Inpatient glycemic control: Novolog 0-15 units Q4H; TPN @ 80 ml/hr (with 10 units of insulin)  Inpatient Diabetes Program Recommendations:    Insulin: Please consider increasing Novolog correction to 0-20 units Q4H and if TPN is continued at current rate please consider increasing insulin in TPN to 30 units.  Thanks, Barnie Alderman, RN, MSN, [place] Diabetes Coordinator Inpatient Diabetes Program [phone] (Team Pager from 8am to [place])

## 2021-10-06 NOTE — Progress Notes (Signed)
NAME:  Todd Spencer, MRN:  [id number], DOB:  December 11, 1950, LOS: 6 ADMISSION DATE:  10/17/2021, CONSULTATION DATE: 10/06/2021  History of Present Illness/SYNOPSIS  71 year old male with a history of CAD, ischemic cardiomyopathy, type 2 diabetes, HTN, CKD, and OSA who presented to the ED with abdominal pain, nausea and vomiting. CT scan of the abdomen was performed showing portal venous and mesenteric gas, highly concerning for mesenteric ischemia.  He was started on antibiotics and taken emergently to the OR by Dr. Christian Mate for an exploratory laparotomy. History was mostly obtained by chart review as the patient was transferred to the ICU intubated following his procedure.   Dr. Christian Mate noted multiple adhesions in the abdomen which were excised and lysed. A mild non-compromised transition area was noted, with the small bowel appearing healthy and well perfused, with no signs of discoloration pneumatosis or perforation. Similarly, the colon was healthy appearing. Following the procedure, he was transferred to the ICU for gradual extubation and further care.  Pertinent  Medical History  CAD s/p stenting to his RCA and LAD HFrEF, ischemic cardiomyopathy HTN HLD T2DM OSA on CPAP  Significant Hospital Events: Including procedures, antibiotic start and stop dates in addition to other pertinent events   10/04/2021: Exploratory laparotomy with lysis of adhesions, no       mesenteric ischemia noted 09/20/2021: Overnight pt with 450 ml of bloody OGT concerning       for possible GIB and worsening hyperkalemia  10/12/2021: Pt extubated to [place]  10/01/2021: CT Chest/Abd/Pelvis revealed Resolution of previously noted pneumatosis and portal venous gas portal venous gas. There are some persistent areas of small-bowel dilatation, with multiple ill-defined loops of small bowel and multiple air-fluid levels. This may simply represent a mild postoperative ileus, although persistent partial small bowel  obstruction is not entirely excluded. Trace volume of ascites. Dependent opacities in the lung bases bilaterally favored to predominantly reflect areas of subsegmental atelectasis and/or chronic scarring, although a small amount of airspace consolidation is not excluded in the posterior aspect of the right lower lobe. Aortic atherosclerosis, in addition to left main and three-vessel coronary artery disease. Assessment for potential risk factor modification, dietary therapy or pharmacologic therapy may be warranted, if clinically indicated. Additional incidental findings, as above. 10/02/2021: Pt developed worsening delirium/agitation overnight requiring precedex gtt.  Pt lethargic this am but able to follow commands with no signs of agitation.  Will attempt to wean off Bipap to HFNC once mentation improves.  Minimal NGT output overnight  10/03/2021- Trickle feeds initiated on 08/14 '@10'$  ml/hr.  However, overnight he pulled the NGT out.  This am he is having worsening abdominal distension and pain. KUB revealed ileus pattern with slightly progressing bowel gas pattern.  Orders placed to reinsert NGT once placement confirmed will place to LIS.  Intubated to allow time for ileus to resolve (see discussion 10/03/21 note) 8/18 remains on vent, ABD distended, failure to wean from vent   Interim History / Subjective:  +resp failure +abd distention +critically ill Prognosis is guarded    Objective   Blood pressure (!) 110/56, pulse 69, temperature 99.1 F (37.3 C), temperature source Oral, resp. rate 14, height '5\' 11"'$  (1.803 m), weight 105.7 kg, SpO2 92 %.    Vent Mode: PRVC FiO2 (%):  [28 %-35 %] 28 % Set Rate:  [14 bmp] 14 bmp Vt Set:  [550 mL] 550 mL PEEP:  [5 cmH20] 5 cmH20 Plateau Pressure:  [20 cmH20] 20 cmH20   Intake/Output Summary (  Last 24 hours) at 10/06/2021 0756 Last data filed at 10/06/2021 0600 Gross per 24 hour  Intake 2570.71 ml  Output 2280 ml  Net 290.71 ml    Filed Weights    10/04/21 0500 10/05/21 0420 10/06/21 0450  Weight: 102.8 kg 106.4 kg 105.7 kg    REVIEW OF SYSTEMS  PATIENT IS UNABLE TO PROVIDE COMPLETE REVIEW OF SYSTEMS DUE TO SEVERE CRITICAL ILLNESS    PHYSICAL EXAMINATION:  GENERAL:critically ill appearing,  EYES: Pupils equal, round, reactive to light.  No scleral icterus.  MOUTH: Moist mucosal membrane. INTUBATED NECK: Supple.  PULMONARY: +rhonchi, +wheezing CARDIOVASCULAR: S1 and S2.  No murmurs  GASTROINTESTINAL: Soft, nontender, -distended. Positive bowel sounds. Midline incision with honeycomb dressing ,+distention NO BS MUSCULOSKELETAL: +edema.  NEUROLOGIC: obtunded SKIN:intact,warm,dry  Resolved Hospital Problem list     Assessment & Plan:   71 yo obese WM with  s/p exploratory laparotomy and LOA for CT findings concerning for SBO, pneumatosis, and pneumoperitoneum anticipated post-operative ileus 5 Days Post-Op associated with severe hypoxic/hypercapnic resp failure and failure to wean from vent  Severe ACUTE Hypoxic and Hypercapnic Respiratory Failure -continue Mechanical Ventilator support -Wean Fio2 and PEEP as tolerated -VAP/VENT bundle implementation - Wean PEEP & FiO2 as tolerated, maintain SpO2 > 88% - Head of bed elevated 30 degrees, VAP protocol in place - Plateau pressures less than 30 cm H20  - Intermittent chest x-ray & ABG PRN - Ensure adequate pulmonary hygiene  -will NOT perform SAT/SBT BOWEL REST TODAY UNSAFE for extubation until has better return of bowel function due to loss of FRC leading to atelectasis as well as his delirium requiring sedation meds which predispose him to CO2 retention   ABDOMEN SBO with small bowel ischemia- s/p ex lap LOA 8/12 Postoperative ileus- ongoing    NEUROLOGY ACUTE METABOLIC ENCEPHALOPATHY Postoperative delirium- with background of alzheimer's and vascular dementia -need for sedation -Goal RASS -2 to -3 May consider imaging if patient remains  encephalopathic   Postoperative hypoxemic respiratory failure- due risk of aspiration (see discussion 10/03/21), decision made to keep on mechanical ventilation until return of bowel function.  ACUTE KIDNEY INJURY/Renal Failure -continue Foley Catheter-assess need -Avoid nephrotoxic agents -Follow urine output, BMP -Ensure adequate renal perfusion, optimize oxygenation -Renal dose medications   Intake/Output Summary (Last 24 hours) at 10/06/2021 0803 Last data filed at 10/06/2021 0600 Gross per 24 hour  Intake 2570.71 ml  Output 2280 ml  Net 290.71 ml    ACUTE ANEMIA- TRANSFUSE AS NEEDED CONSIDER TRANSFUSION  IF HGB<7   ENDO - ICU hypoglycemic\Hyperglycemia protocol -check FSBS per protocol   GI GI PROPHYLAXIS as indicated  NUTRITIONAL STATUS DIET-->BOWEL REST, on TPN Constipation protocol as indicated   ELECTROLYTES -follow labs as needed -replace as needed -pharmacy consultation and following     Best Practice (right click and "Reselect all SmartList Selections" daily)   Diet/type: TPN DVT prophylaxis: prophylactic heparin  GI prophylaxis: PPI Lines: PICC Foley:  Yes, and it is still needed: keep for a few more days Code Status:  full code Last date of multidisciplinary goals of care discussion [10/03/2021]     DVT/GI PRX  assessed I Assessed the need for Labs I Assessed the need for Foley I Assessed the need for Central Venous Line Family Discussion when available I Assessed the need for Mobilization I made an Assessment of medications to be adjusted accordingly Safety Risk assessment completed  CASE DISCUSSED IN MULTIDISCIPLINARY ROUNDS WITH ICU TEAM     Critical Care Time devoted  to patient care services described in this note is 55 minutes.  Critical care was necessary to treat /prevent imminent and life-threatening deterioration.   Corrin Parker, M.D.  [facility]  Medical Director [place] Director [facility] Card[facility] Department
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
## 2021-10-06 NOTE — Consult Note (Addendum)
PHARMACY - TOTAL PARENTERAL NUTRITION CONSULT NOTE   Indication: Prolonged ileus  Patient Measurements: Height: '5\' 11"'$  (180.3 cm) Weight: 105.7 kg (233 lb 0.4 oz) IBW/kg (Calculated) : 75.3   Body mass index is 32.5 kg/m.  Assessment:  Patient is a 71 y/o M with past medical history including CAD, ischemic cardiomyopathy, DM, HTN, CKD, OSA who presented to the ED 8/12 with abdominal pain, nausea, and vomiting. Imaging was concerning for mesenteric ischemia and patient was taken for emergent exploratory laparotomy with lysis of adhesions and no mesenteric ischemia noted. Patient was extubated 8/13. Post-operative course complicated by delirium, ongoing ileus. Given overall clinical picture, patient was re-intubated 8/15 and PICC was placed to start TPN 8/15.   Glucose / Insulin: History of DM (last Hgb A1c 7.1%). BG last 24h: 210 - 244. Currently on moderate SSI q4h. 25u SSI required last 24h. 10u insulin in TPN Electrolytes: Mild hyperkalemia Renal: Scr stabilizing (1.1 >> 1.46 >> 1.28) Hepatic: No transaminitis. LFTs normal Intake / Output; MIVF: I&O: + 1.9L GI Imaging: 8/12 CT abdomen / pelvis: Concerning for acute mesenteric ischemia. Extensive pneumatosis within several loops of small bowel within the central abdomen with mucosal hypoenhancement. Extensive mesenteric and portal venous gas. Small volume pneumoperitoneum.Multiple dilated loops of small bowel without abrupt transition point. It is uncertain if this is secondary to bowel ischemia or mechanical obstruction. 8/13 CT abdomen / pelvis: Resolution of previously noted pneumatosis and portal venous gas portal venous gas. There are some persistent areas of small-bowel dilatation, with multiple ill-defined loops of small bowel and multiple air-fluid levels. This may simply represent a mild postoperative ileus, although persistent partial small bowel obstruction is not entirely excluded. GI Surgeries / Procedures:  8/12: ExLap with  lysis of adhesions. No mesenteric ischemia found  Central access: 8/15 TPN start date: 8/15  Nutritional Goals: Goal TPN rate is 80 mL/hr (provides 150 g of protein and 1374 kcals per day)  RD Assessment: Estimated Needs Total Energy Estimated Needs: 1142-1454kcal/day Total Protein Estimated Needs: 150-160g/day Total Fluid Estimated Needs: 2.0L/day  Nutritional needs are expected to change when patient is extubated; see prior RD assessments  Current Nutrition:  NPO  Propofol discontinued 8/16 secondary to hypertriglyceridemia  Potential start enteral trickle feeds 8/18  Plan:  --Continue TPN to 80 mL/hr (full rate) at 1800 Lipids currently withheld from TPN given hypertriglyceridemia, continue to monitor daily for now, plan to add lipids once TG < 500 Expected content at full rate: *Anticipate minor logistical changes* Protein: 150 g Glucose: 115 g Lipids: 38.4 g (currently held) Kcal: 1374 (990 with lipids held) --Electrolytes in TPN: Na 31mq/L, K 342m/L, Ca 48m40mL, Mg 48mE62m, and Phos 148mm53m. Cl:Ac 1:1 Add standard MVI and trace elements to TPN Thiamine 100 mg x 3 days completed Insulin and dextrose shifting therapy ordered for hyperkalemia --Increase to Resistant q4h SSI and adjust as needed  Add 20u (from 10u) insulin to TPN --Monitor TPN labs on Mon/Thurs, daily until stable  Todd Spencer Todd Spencer/2023,8:12 AM

## 2021-10-06 NOTE — Progress Notes (Addendum)
[facility] Day(s): 6.   Post op day(s): 6 Days Post-Op.   Interval History:  Patient seen and examined Remains off vasopressor support No acute events overnight Renal function improved some; sCr - 1.28; UO - 2080 ccs Hyperkalemia to 5.2 Hypertriglyceridemia improved; 595 NGT with 200 ccs out No BM  Vital signs in last 24 hours: [min-max] current  Temp:  [99.1 F (37.3 C)-99.4 F (37.4 C)] 99.1 F (37.3 C) (08/18 0400) Pulse Rate:  [69-99] 69 (08/18 0600) Resp:  [12-16] 14 (08/18 0600) BP: (105-138)/(53-69) 110/56 (08/18 0600) SpO2:  [90 %-97 %] 92 % (08/18 0600) FiO2 (%):  [28 %-35 %] 28 % (08/18 0814) Weight:  [105.7 kg] 105.7 kg (08/18 0450)     Height: '5\' 11"'$  (180.3 cm) Weight: 105.7 kg BMI (Calculated): 32.51   Intake/Output last 2 shifts:  08/17 0701 - 08/18 0700 In: 2570.7 [I.V.:2343.2; IV Piggyback:227.5] Out: 2280 [Urine:2080; Emesis/NG output:200]   Physical Exam:  Constitutional: Sedated, intubated  HEENT: NGT in place; clearing bilious output  Respiratory: On ventilator  Cardiovascular: regular rate and sinus rhythm  Gastrointestinal: Soft, distension improving. Unable to assess tenderness  Genitourinary: Foley in place; clear urine.  Integumentary: Laparotomy is CDI with staples and honeycomb; no erythema, no drainage  Labs:     Latest Ref Rng & Units 10/05/2021    4:21 AM 10/04/2021    5:40 AM 10/03/2021    9:05 AM  CBC  WBC 4.0 - 10.5 K/uL 5.4  5.9  8.2   Hemoglobin 13.0 - 17.0 g/dL 10.2  10.5  11.4   Hematocrit 39.0 - 52.0 % 31.9  32.6  34.9   Platelets 150 - 400 K/uL 91  98  85       Latest Ref Rng & Units 10/06/2021    5:48 AM 10/05/2021    4:21 AM 10/04/2021    5:40 AM  CMP  Glucose 70 - 99 mg/dL 272  217  181   BUN 8 - 23 mg/dL 62  58  56   Creatinine 0.61 - 1.24 mg/dL 1.28  1.31  1.46   Sodium 135 - 145 mmol/L 143  138  137   Potassium 3.5 - 5.1 mmol/L 5.2  4.7  4.2   Chloride 98 - 111  mmol/L 116  108  106   CO2 22 - 32 mmol/L '25  25  26   '$ Calcium 8.9 - 10.3 mg/dL 9.0  8.5  8.2   Total Protein 6.5 - 8.1 g/dL  6.1  5.9   Total Bilirubin 0.3 - 1.2 mg/dL  0.6  0.7   Alkaline Phos 38 - 126 U/L  39  38   AST 15 - 41 U/L  14  17   ALT 0 - 44 U/L  20  22      Imaging studies: No new pertinent imaging studies   Assessment/Plan:  71 y.o. male with anticipated post-operative ileus 6 Days Post-Op s/p exploratory laparotomy and LOA for CT findings concerning for SBO, pneumatosis, and pneumoperitoneum   - Low OGT output and distension improving; I feel we can repeat clamping trial. If residuals after 4 hours clamped are <150 ccs, we can restart trickle feeds cautiously.   - Continue TPN for now; goal rate  - Monitor hyperkalemia   - Foley catheter per primary service; good UO - Monitor abdominal examination; on-going bowel function   - Can consider serial KUBs prn; ordered for AM  - Monitor  renal function; improving  - OOB once feasible and more alert   - Further management per primary service; we will of course follow allong  All of the above findings and recommendations were discussed with the medical team, family present at bedside.   -- Edison Simon, PA-C [facility] 10/06/2021, 8:18 AM M-F: 7am - 4pm

## 2021-10-07 ENCOUNTER — Inpatient Hospital Stay: Payer: Medicare HMO

## 2021-10-07 DIAGNOSIS — R109 Unspecified abdominal pain: Secondary | ICD-10-CM | POA: Diagnosis not present

## 2021-10-07 DIAGNOSIS — K560 Paralytic ileus: Secondary | ICD-10-CM

## 2021-10-07 DIAGNOSIS — K55059 Acute (reversible) ischemia of intestine, part and extent unspecified: Secondary | ICD-10-CM | POA: Diagnosis not present

## 2021-10-07 DIAGNOSIS — K559 Vascular disorder of intestine, unspecified: Secondary | ICD-10-CM | POA: Diagnosis not present

## 2021-10-07 LAB — BASIC METABOLIC PANEL
Anion gap: 4 — ABNORMAL LOW (ref 5–15)
BUN: 63 mg/dL — ABNORMAL HIGH (ref 8–23)
CO2: 22 mmol/L (ref 22–32)
Calcium: 9.5 mg/dL (ref 8.9–10.3)
Chloride: 117 mmol/L — ABNORMAL HIGH (ref 98–111)
Creatinine, Ser: 1.26 mg/dL — ABNORMAL HIGH (ref 0.61–1.24)
GFR, Estimated: >60 mL/min (ref >60)
Glucose, Bld: 220 mg/dL — ABNORMAL HIGH (ref 70–99)
Potassium: 5 mmol/L (ref 3.5–5.1)
Sodium: 143 mmol/L (ref 135–145)

## 2021-10-07 LAB — CBC
HCT: 29.3 % — ABNORMAL LOW (ref 39.0–52.0)
Hemoglobin: 9.4 g/dL — ABNORMAL LOW (ref 13.0–17.0)
MCH: 29.8 pg (ref 26.0–34.0)
MCHC: 32.1 g/dL (ref 30.0–36.0)
MCV: 93 fL (ref 80.0–100.0)
Platelets: 130 10*3/uL — ABNORMAL LOW (ref 150–400)
RBC: 3.15 MIL/uL — ABNORMAL LOW (ref 4.22–5.81)
RDW: 15.6 % — ABNORMAL HIGH (ref 11.5–15.5)
WBC: 7.6 10*3/uL (ref 4.0–10.5)
nRBC: 0 % (ref 0.0–0.2)

## 2021-10-07 LAB — GLUCOSE, CAPILLARY
Glucose-Capillary: 205 mg/dL — ABNORMAL HIGH (ref 70–99)
Glucose-Capillary: 251 mg/dL — ABNORMAL HIGH (ref 70–99)
Glucose-Capillary: 279 mg/dL — ABNORMAL HIGH (ref 70–99)
Glucose-Capillary: 253 mg/dL — ABNORMAL HIGH (ref 70–99)

## 2021-10-07 LAB — MAGNESIUM: Magnesium: 2.1 mg/dL (ref 1.7–2.4)

## 2021-10-07 LAB — TRIGLYCERIDES: Triglycerides: 527 mg/dL — ABNORMAL HIGH (ref <150)

## 2021-10-07 LAB — PHOSPHORUS: Phosphorus: 3.3 mg/dL (ref 2.5–4.6)

## 2021-10-07 MED ORDER — TRAVASOL 10 % IV SOLN
INTRAVENOUS | Status: AC
  Filled 2021-10-07: qty 1497.6

## 2021-10-07 MED ORDER — INSULIN ASPART 100 UNIT/ML IJ SOLN
8.0000 [IU] | INTRAMUSCULAR | Status: DC
Start: 2021-10-07 — End: 2021-10-08
  Administered 2021-10-07: 8 [IU] via SUBCUTANEOUS
  Filled 2021-10-07 (×2): qty 1

## 2021-10-07 MED ORDER — TRAVASOL 10 % IV SOLN: INTRAVENOUS | Status: DC

## 2021-10-07 MED ORDER — INSULIN GLARGINE-YFGN 100 UNIT/ML ~~LOC~~ SOLN
10.0000 [IU] | Freq: Two times a day (BID) | SUBCUTANEOUS | Status: DC
Start: 2021-10-07 — End: 2021-10-08
  Administered 2021-10-07: 10 [IU] via SUBCUTANEOUS
  Filled 2021-10-07: qty 0.1

## 2021-10-07 NOTE — Consult Note (Signed)
PHARMACY - TOTAL PARENTERAL NUTRITION CONSULT NOTE   Indication: Prolonged ileus  Patient Measurements: Height: '5\' 11"'$  (180.3 cm) Weight: 105.1 kg (231 lb 11.3 oz) IBW/kg (Calculated) : 75.3   Body mass index is 32.32 kg/m.  Assessment:  Patient is a 71 y/o M with past medical history including CAD, ischemic cardiomyopathy, DM, HTN, CKD, OSA who presented to the ED 8/12 with abdominal pain, nausea, and vomiting. Imaging was concerning for mesenteric ischemia and patient was taken for emergent exploratory laparotomy with lysis of adhesions and no mesenteric ischemia noted. Patient was extubated 8/13. Post-operative course complicated by delirium, ongoing ileus. Given overall clinical picture, patient was re-intubated 8/15 and PICC was placed to start TPN 8/15.   Glucose / Insulin: History of DM (last Hgb A1c 7.1%). BG last 24h: 205 - 227. Currently on moderate SSI q4h + 5u q4h. 25u SSI required last 24h. 20u insulin in TPN Electrolytes: Mild hyperkalemia Renal: Scr stabilizing (1.1 >> 1.46 >> 1.28) Hepatic: No transaminitis. LFTs normal Intake / Output; MIVF: I&O: + 2.1L GI Imaging: 8/12 CT abdomen / pelvis: Concerning for acute mesenteric ischemia. Extensive pneumatosis within several loops of small bowel within the central abdomen with mucosal hypoenhancement. Extensive mesenteric and portal venous gas. Small volume pneumoperitoneum.Multiple dilated loops of small bowel without abrupt transition point. It is uncertain if this is secondary to bowel ischemia or mechanical obstruction. 8/13 CT abdomen / pelvis: Resolution of previously noted pneumatosis and portal venous gas portal venous gas. There are some persistent areas of small-bowel dilatation, with multiple ill-defined loops of small bowel and multiple air-fluid levels. This may simply represent a mild postoperative ileus, although persistent partial small bowel obstruction is not entirely excluded. GI Surgeries / Procedures:  8/12:  ExLap with lysis of adhesions. No mesenteric ischemia found  Central access: 8/15 TPN start date: 8/15  Nutritional Goals: Goal TPN rate is 80 mL/hr (provides 150 g of protein and 1374 kcals per day)  RD Assessment: Estimated Needs Total Energy Estimated Needs: 1162-1480 Total Protein Estimated Needs: 150-160g/day Total Fluid Estimated Needs: 2.0L/day  Nutritional needs are expected to change when patient is extubated; see prior RD assessments  Current Nutrition:  NPO  Propofol discontinued 8/16 secondary to hypertriglyceridemia  Potential start enteral trickle feeds 8/18  Plan:  --Continue TPN to 80 mL/hr (full rate) at 1800 Lipids currently withheld from TPN given hypertriglyceridemia, continue to monitor daily for now, plan to add lipids once TG < 500 Expected content at full rate: *Anticipate minor logistical changes* Protein: 150 g Glucose: 115 g Lipids: 38.4 g (currently held) Kcal: 1374 (990 with lipids held) --Electrolytes in TPN: Na 49mq/L, K 356m/L, Ca 84m59mL, Mg 84mE54m, and Phos 184mm29m. Cl:Ac 1:1 Add standard MVI and trace elements to TPN Thiamine 100 mg x 3 days completed Insulin and dextrose shifting therapy ordered for hyperkalemia --Increase to Resistant q4h SSI and adjust as needed  Add 300u (from 20u) insulin to TPN --Monitor TPN labs on Mon/Thurs, daily until stable  Todd Spencer A Todd Spencer 10/07/2021,9:07 AM

## 2021-10-07 NOTE — Progress Notes (Signed)
N/G Residual checked to determine the appropriateness of advancing tube feeds. Residual amount was 90 ml and TF were advanced to 40 ml/hr.

## 2021-10-07 NOTE — Progress Notes (Addendum)
Subjective:  CC: Todd Spencer is a 71 y.o. male  Hospital stay day 7, 7 Days Post-Op s/p exploratory laparotomy and LOA for CT findings concerning for SBO, pneumatosis, and pneumoperitoneum  HPI: No acute issues overnight per nursing report.  Patient continues to have low residuals and slowly increasing tube feeds.  ROS:  Unable to obtain secondary to patient's status  Objective:   Temp:  [99.1 F (37.3 C)-99.6 F (37.6 C)] 99.4 F (37.4 C) (08/19 0400) Pulse Rate:  [63-87] 69 (08/19 0600) Resp:  [13-16] 13 (08/19 0600) BP: (108-143)/(55-73) 118/64 (08/19 0600) SpO2:  [91 %-97 %] 95 % (08/19 0828) FiO2 (%):  [28 %] 28 % (08/19 0828) Weight:  [105.1 kg] 105.1 kg (08/19 0600)     Height: '5\' 11"'$  (180.3 cm) Weight: 105.1 kg BMI (Calculated): 32.33   Intake/Output this shift:   Intake/Output Summary (Last 24 hours) at 10/07/2021 1033 Last data filed at 10/07/2021 0600 Gross per 24 hour  Intake 2962.84 ml  Output 2745 ml  Net 217.84 ml    Constitutional :  Intubated and sedated  Respiratory:  clear to auscultation bilaterally  Cardiovascular:  regular rate and rhythm  Gastrointestinal: Soft, no guarding, distended .   Skin: Cool and moist.  Staple line clean dry and intact.  Scant erythema surrounding the staple lines.  Psychiatric: Sedated       LABS:     Latest Ref Rng & Units 10/07/2021    4:43 AM 10/06/2021    5:48 AM 10/05/2021    4:21 AM  CMP  Glucose 70 - 99 mg/dL 220  272  217   BUN 8 - 23 mg/dL 63  62  58   Creatinine 0.61 - 1.24 mg/dL 1.26  1.28  1.31   Sodium 135 - 145 mmol/L 143  143  138   Potassium 3.5 - 5.1 mmol/L 5.0  5.2  4.7   Chloride 98 - 111 mmol/L 117  116  108   CO2 22 - 32 mmol/L '22  25  25   '$ Calcium 8.9 - 10.3 mg/dL 9.5  9.0  8.5   Total Protein 6.5 - 8.1 g/dL   6.1   Total Bilirubin 0.3 - 1.2 mg/dL   0.6   Alkaline Phos 38 - 126 U/L   39   AST 15 - 41 U/L   14   ALT 0 - 44 U/L   20       Latest Ref Rng & Units 10/07/2021    4:43 AM  10/05/2021    4:21 AM 10/04/2021    5:40 AM  CBC  WBC 4.0 - 10.5 K/uL 7.6  5.4  5.9   Hemoglobin 13.0 - 17.0 g/dL 9.4  10.2  10.5   Hematocrit 39.0 - 52.0 % 29.3  31.9  32.6   Platelets 150 - 400 K/uL 130  91  98     RADS: N/a Assessment:   S/ps/p exploratory laparotomy and LOA for CT findings concerning for SBO, pneumatosis, and pneumoperitoneum.  Stable from general surgery standpoint.  Continue to increase tube feeds as tolerated to goal rate.  Further care per ICU for his respiratory status.  Continue antibiotics for full course.  labs/images/medications/previous chart entries reviewed personally and relevant changes/updates noted above.

## 2021-10-07 NOTE — Progress Notes (Signed)
NAME:  Todd Spencer, MRN:  [id number], DOB:  17-May-1950, LOS: 7 ADMISSION DATE:  10/07/2021, CONSULTATION DATE: 10/10/2021  History of Present Illness/SYNOPSIS  71 year old male with a history of CAD, ischemic cardiomyopathy, type 2 diabetes, HTN, CKD, and OSA who presented to the ED with abdominal pain, nausea and vomiting. CT scan of the abdomen was performed showing portal venous and mesenteric gas, highly concerning for mesenteric ischemia.  He was started on antibiotics and taken emergently to the OR by Dr. Christian Mate for an exploratory laparotomy. History was mostly obtained by chart review as the patient was transferred to the ICU intubated following his procedure.   Dr. Christian Mate noted multiple adhesions in the abdomen which were excised and lysed. A mild non-compromised transition area was noted, with the small bowel appearing healthy and well perfused, with no signs of discoloration pneumatosis or perforation. Similarly, the colon was healthy appearing. Following the procedure, he was transferred to the ICU for gradual extubation and further care.  Pertinent  Medical History  CAD s/p stenting to his RCA and LAD HFrEF, ischemic cardiomyopathy HTN HLD T2DM OSA on CPAP  Significant Hospital Events: Including procedures, antibiotic start and stop dates in addition to other pertinent events   10/16/2021: Exploratory laparotomy with lysis of adhesions, no       mesenteric ischemia noted 10/16/2021: Overnight pt with 450 ml of bloody OGT concerning       for possible GIB and worsening hyperkalemia  09/25/2021: Pt extubated to [facility]  10/01/2021: CT Chest/Abd/Pelvis revealed Resolution of previously noted pneumatosis and portal venous gas portal venous gas. There are some persistent areas of small-bowel dilatation, with multiple ill-defined loops of small bowel and multiple air-fluid levels. This may simply represent a mild postoperative ileus, although persistent partial small bowel  obstruction is not entirely excluded. Trace volume of ascites. Dependent opacities in the lung bases bilaterally favored to predominantly reflect areas of subsegmental atelectasis and/or chronic scarring, although a small amount of airspace consolidation is not excluded in the posterior aspect of the right lower lobe. Aortic atherosclerosis, in addition to left main and three-vessel coronary artery disease. Assessment for potential risk factor modification, dietary therapy or pharmacologic therapy may be warranted, if clinically indicated. Additional incidental findings, as above. 10/02/2021: Pt developed worsening delirium/agitation overnight requiring precedex gtt.  Pt lethargic this am but able to follow commands with no signs of agitation.  Will attempt to wean off Bipap to HFNC once mentation improves.  Minimal NGT output overnight  10/03/2021- Trickle feeds initiated on 08/14 '@10'$  ml/hr.  However, overnight he pulled the NGT out.  This am he is having worsening abdominal distension and pain. KUB revealed ileus pattern with slightly progressing bowel gas pattern.  Orders placed to reinsert NGT once placement confirmed will place to LIS.  Intubated to allow time for ileus to resolve (see discussion 10/03/21 note) 8/18 remains on vent, ABD distended, failure to wean from vent 8/19 remains on vent, ABD distended, tolerated TF's  Interim History / Subjective:  Remains critically ill ABD remains distended Failure to wean from vent     Objective   Blood pressure 118/64, pulse 69, temperature 99.4 F (37.4 C), temperature source Oral, resp. rate 13, height '5\' 11"'$  (1.803 m), weight 105.1 kg, SpO2 92 %.    Vent Mode: PRVC FiO2 (%):  [28 %] 28 % Set Rate:  [14 bmp] 14 bmp Vt Set:  [550 mL] 550 mL PEEP:  [5 cmH20] 5 cmH20 Plateau Pressure:  [  14 cmH20-15 cmH20] 15 cmH20   Intake/Output Summary (Last 24 hours) at 10/07/2021 0714 Last data filed at 10/07/2021 0600 Gross per 24 hour  Intake 2962.84 ml   Output 2745 ml  Net 217.84 ml    Filed Weights   10/05/21 0420 10/06/21 0450 10/07/21 0600  Weight: 106.4 kg 105.7 kg 105.1 kg   REVIEW OF SYSTEMS  PATIENT IS UNABLE TO PROVIDE COMPLETE REVIEW OF SYSTEMS DUE TO SEVERE CRITICAL ILLNESS    PHYSICAL EXAMINATION:  GENERAL:critically ill appearing, +resp distress EYES: Pupils equal, round, reactive to light.  No scleral icterus.  MOUTH: Moist mucosal membrane. INTUBATED NECK: Supple.  PULMONARY: +rhonchi, +wheezing CARDIOVASCULAR: S1 and S2.  No murmurs  GASTROINTESTINAL: Soft, nontender, -distended. Positive bowel sounds.  MUSCULOSKELETAL: No swelling, clubbing, or edema.  NEUROLOGIC: obtunded SKIN:intact,warm,dry     Assessment & Plan:   71 yo obese WM with  s/p exploratory laparotomy and LOA for CT findings concerning for SBO, pneumatosis, and pneumoperitoneum anticipated post-operative ileus 5 Days Post-Op associated with severe hypoxic/hypercapnic resp failure and failure to wean from vent  Severe ACUTE Hypoxic and Hypercapnic Respiratory Failure -continue Mechanical Ventilator support -Wean Fio2 and PEEP as tolerated -VAP/VENT bundle implementation - Wean PEEP & FiO2 as tolerated, maintain SpO2 > 88% - Head of bed elevated 30 degrees, VAP protocol in place - Plateau pressures less than 30 cm H20  - Intermittent chest x-ray & ABG PRN - Ensure adequate pulmonary hygiene  -will NOT perform SAT/SBT ABD remains distended, SEEMS to be tolerating TF's BUT UNSAFE for extubation until has better return of bowel function due to loss of FRC leading to atelectasis as well as his delirium requiring sedation meds which predispose him to CO2 retention Postoperative hypoxemic respiratory failure- due risk of aspiration (see discussion 10/03/21), decision made to keep on mechanical ventilation until return of bowel function.  ABDOMEN SBO with small bowel ischemia- s/p ex lap LOA 8/12 Postoperative ileus- follow up GEN surg  RECS    NEUROLOGY ACUTE METABOLIC ENCEPHALOPATHY Postoperative delirium- with background of alzheimer's and vascular dementia -need for sedation -Goal RASS -1 May consider imaging if patient remains encephalopathic  ACUTE KIDNEY INJURY/Renal Failure -continue Foley Catheter-assess need -Avoid nephrotoxic agents -Follow urine output, BMP -Ensure adequate renal perfusion, optimize oxygenation -Renal dose medications   Intake/Output Summary (Last 24 hours) at 10/07/2021 0717 Last data filed at 10/07/2021 0600 Gross per 24 hour  Intake 2962.84 ml  Output 2745 ml  Net 217.84 ml      Latest Ref Rng & Units 10/07/2021    4:43 AM 10/06/2021    5:48 AM 10/05/2021    4:21 AM  BMP  Glucose 70 - 99 mg/dL 220  272  217   BUN 8 - 23 mg/dL 63  62  58   Creatinine 0.61 - 1.24 mg/dL 1.26  1.28  1.31   Sodium 135 - 145 mmol/L 143  143  138   Potassium 3.5 - 5.1 mmol/L 5.0  5.2  4.7   Chloride 98 - 111 mmol/L 117  116  108   CO2 22 - 32 mmol/L '22  25  25   '$ Calcium 8.9 - 10.3 mg/dL 9.5  9.0  8.5    ACUTE ANEMIA- TRANSFUSE AS NEEDED CONSIDER TRANSFUSION  IF HGB<7    ENDO - ICU hypoglycemic\Hyperglycemia protocol -check FSBS per protocol   GI GI PROPHYLAXIS as indicated  NUTRITIONAL STATUS DIET-->Trickle feeds started, await gen surgery recs Constipation protocol as indicated   ELECTROLYTES -follow  labs as needed -replace as needed -pharmacy consultation and following    Best Practice (right click and "Reselect all SmartList Selections" daily)   Diet/type: TPN DVT prophylaxis: prophylactic heparin  GI prophylaxis: PPI Lines: PICC Foley:  Yes, and it is still needed: keep for a few more days Code Status:  full code Last date of multidisciplinary goals of care discussion [10/03/2021]    DVT/GI PRX  assessed I Assessed the need for Labs I Assessed the need for Foley I Assessed the need for Central Venous Line Family Discussion when available I Assessed the need  for Mobilization I made an Assessment of medications to be adjusted accordingly Safety Risk assessment completed  CASE DISCUSSED IN MULTIDISCIPLINARY ROUNDS WITH ICU TEAM     Critical Care Time devoted to patient care services described in this note is 45 minutes.  Critical care was necessary to treat /prevent imminent and life-threatening deterioration.  Corrin Parker, M.D.  [facility]  Medical Director [place] Director [facility] Cardio-Pulmonary Department

## 2021-10-07 NOTE — Progress Notes (Signed)
Pt gastric residuals aspirated and found to be <5 ml. Tube feeding rate advanced to 30 per order '@0213'$ 

## 2021-10-08 ENCOUNTER — Inpatient Hospital Stay: Payer: Medicare HMO

## 2021-10-08 DIAGNOSIS — J9602 Acute respiratory failure with hypercapnia: Secondary | ICD-10-CM | POA: Diagnosis not present

## 2021-10-08 DIAGNOSIS — J9601 Acute respiratory failure with hypoxia: Secondary | ICD-10-CM | POA: Diagnosis not present

## 2021-10-08 DIAGNOSIS — K55059 Acute (reversible) ischemia of intestine, part and extent unspecified: Secondary | ICD-10-CM | POA: Diagnosis not present

## 2021-10-08 DIAGNOSIS — R109 Unspecified abdominal pain: Secondary | ICD-10-CM | POA: Diagnosis not present

## 2021-10-08 LAB — CBC WITH DIFFERENTIAL/PLATELET
Abs Immature Granulocytes: 0.21 10*3/uL — ABNORMAL HIGH (ref 0.00–0.07)
Basophils Absolute: 0 10*3/uL (ref 0.0–0.1)
Basophils Relative: 1 %
Eosinophils Absolute: 0.1 10*3/uL (ref 0.0–0.5)
Eosinophils Relative: 2 %
HCT: 32.2 % — ABNORMAL LOW (ref 39.0–52.0)
Hemoglobin: 9.9 g/dL — ABNORMAL LOW (ref 13.0–17.0)
Immature Granulocytes: 3 %
Lymphocytes Relative: 8 %
Lymphs Abs: 0.7 10*3/uL (ref 0.7–4.0)
MCH: 29 pg (ref 26.0–34.0)
MCHC: 30.7 g/dL (ref 30.0–36.0)
MCV: 94.4 fL (ref 80.0–100.0)
Monocytes Absolute: 0.7 10*3/uL (ref 0.1–1.0)
Monocytes Relative: 8 %
Neutro Abs: 6.7 10*3/uL (ref 1.7–7.7)
Neutrophils Relative %: 78 %
Platelets: 180 10*3/uL (ref 150–400)
RBC: 3.41 MIL/uL — ABNORMAL LOW (ref 4.22–5.81)
RDW: 15.9 % — ABNORMAL HIGH (ref 11.5–15.5)
WBC: 8.5 10*3/uL (ref 4.0–10.5)
nRBC: 0 % (ref 0.0–0.2)

## 2021-10-08 LAB — BLOOD GAS, ARTERIAL
Acid-base deficit: 4.8 mmol/L — ABNORMAL HIGH (ref 0.0–2.0)
Bicarbonate: 21.6 mmol/L (ref 20.0–28.0)
FIO2: 40 %
O2 Saturation: 99.2 %
PEEP: 8 cmH2O
Patient temperature: 37
pCO2 arterial: 44 mmHg (ref 32–48)
pH, Arterial: 7.3 — ABNORMAL LOW (ref 7.35–7.45)
pO2, Arterial: 93 mmHg (ref 83–108)

## 2021-10-08 LAB — BASIC METABOLIC PANEL
Anion gap: 4 — ABNORMAL LOW (ref 5–15)
BUN: 73 mg/dL — ABNORMAL HIGH (ref 8–23)
CO2: 22 mmol/L (ref 22–32)
Calcium: 9.7 mg/dL (ref 8.9–10.3)
Chloride: 123 mmol/L — ABNORMAL HIGH (ref 98–111)
Creatinine, Ser: 1.41 mg/dL — ABNORMAL HIGH (ref 0.61–1.24)
GFR, Estimated: 53 mL/min — ABNORMAL LOW (ref >60)
Glucose, Bld: 230 mg/dL — ABNORMAL HIGH (ref 70–99)
Potassium: 5.3 mmol/L — ABNORMAL HIGH (ref 3.5–5.1)
Sodium: 149 mmol/L — ABNORMAL HIGH (ref 135–145)

## 2021-10-08 LAB — PROCALCITONIN: Procalcitonin: 0.8 ng/mL

## 2021-10-08 LAB — GLUCOSE, CAPILLARY
Glucose-Capillary: 258 mg/dL — ABNORMAL HIGH (ref 70–99)
Glucose-Capillary: 262 mg/dL — ABNORMAL HIGH (ref 70–99)
Glucose-Capillary: 276 mg/dL — ABNORMAL HIGH (ref 70–99)
Glucose-Capillary: 282 mg/dL — ABNORMAL HIGH (ref 70–99)

## 2021-10-08 LAB — PHOSPHORUS: Phosphorus: 3.7 mg/dL (ref 2.5–4.6)

## 2021-10-08 LAB — TRIGLYCERIDES: Triglycerides: 442 mg/dL — ABNORMAL HIGH (ref <150)

## 2021-10-08 LAB — MAGNESIUM: Magnesium: 2.1 mg/dL (ref 1.7–2.4)

## 2021-10-08 MED ORDER — SENNOSIDES 8.8 MG/5ML PO SYRP
5.0000 mL | ORAL_SOLUTION | Freq: Two times a day (BID) | ORAL | Status: DC
Start: 2021-10-08 — End: 2021-10-18
  Administered 2021-10-08 – 2021-10-17 (×11): 5 mL
  Filled 2021-10-08 (×21): qty 5

## 2021-10-08 MED ORDER — INSULIN GLARGINE-YFGN 100 UNIT/ML ~~LOC~~ SOLN
20.0000 [IU] | Freq: Two times a day (BID) | SUBCUTANEOUS | Status: DC
  Administered 2021-10-09 – 2021-10-13 (×9): 20 [IU] via SUBCUTANEOUS
  Filled 2021-10-08 (×10): qty 0.2

## 2021-10-08 MED ORDER — TRAVASOL 10 % IV SOLN
INTRAVENOUS | Status: DC
  Filled 2021-10-08: qty 1497.6

## 2021-10-08 MED ORDER — INSULIN ASPART 100 UNIT/ML IJ SOLN
10.0000 [IU] | INTRAMUSCULAR | Status: DC
  Administered 2021-10-08 (×6): 10 [IU] via SUBCUTANEOUS
  Filled 2021-10-08 (×6): qty 1

## 2021-10-08 MED ORDER — ORAL CARE MOUTH RINSE: 15.0000 mL | OROMUCOSAL | Status: DC | PRN

## 2021-10-08 MED ORDER — TRAVASOL 10 % IV SOLN
INTRAVENOUS | Status: AC
  Filled 2021-10-08: qty 748.8

## 2021-10-08 MED ORDER — INSULIN ASPART 100 UNIT/ML IJ SOLN
12.0000 [IU] | INTRAMUSCULAR | Status: DC
  Administered 2021-10-08 – 2021-10-13 (×26): 12 [IU] via SUBCUTANEOUS
  Filled 2021-10-08 (×25): qty 1

## 2021-10-08 MED ORDER — VITAL HIGH PROTEIN PO LIQD
1000.0000 mL | ORAL | Status: DC
  Administered 2021-10-08 – 2021-10-09 (×2): 1000 mL

## 2021-10-08 MED ORDER — ORAL CARE MOUTH RINSE
15.0000 mL | OROMUCOSAL | Status: DC
  Administered 2021-10-08 – 2021-10-18 (×113): 15 mL via OROMUCOSAL

## 2021-10-08 MED ORDER — INSULIN GLARGINE-YFGN 100 UNIT/ML ~~LOC~~ SOLN
20.0000 [IU] | Freq: Two times a day (BID) | SUBCUTANEOUS | Status: DC
  Filled 2021-10-08: qty 0.2

## 2021-10-08 MED ORDER — INSULIN GLARGINE-YFGN 100 UNIT/ML ~~LOC~~ SOLN
15.0000 [IU] | Freq: Two times a day (BID) | SUBCUTANEOUS | Status: DC
  Administered 2021-10-08 (×2): 15 [IU] via SUBCUTANEOUS
  Filled 2021-10-08 (×2): qty 0.15

## 2021-10-08 NOTE — Progress Notes (Signed)
At approximately 0000. Patient was not able to maintain O2 sat on previous vent settings. Notified respiratory and NP, FIO2 and PEEP increased, awaiting results of ABG ordered for 0100, now O2 sat is 93% on 40 %. Will continue to monitor.

## 2021-10-08 NOTE — Progress Notes (Signed)
Patient continues to remain intubated and on mechanical vent, responds to pain, foley catheter was removed at the beginning of the shift and patient now has on a condom cath and is intact and working well.  Patient tolerated every two hour turns well.  Patient's tube feeding and tubing was changed out at 1600, CHG bath also given at this time, patient tolerated care well. Per Dr. Mortimer Fries no wake assessment to be done for this patient today.  Will continue to monitor patient.

## 2021-10-08 NOTE — Progress Notes (Signed)
Tube feed residual 45 ml; tube feed rate increased to  50 ml/hr per order

## 2021-10-08 NOTE — Progress Notes (Signed)
NAME:  Todd Spencer, MRN:  [id number], DOB:  1951/02/14, LOS: 65 ADMISSION DATE:  10/02/2021, CONSULTATION DATE: 10/05/2021  History of Present Illness/SYNOPSIS  71 year old male with a history of CAD, ischemic cardiomyopathy, type 2 diabetes, HTN, CKD, and OSA who presented to the ED with abdominal pain, nausea and vomiting. CT scan of the abdomen was performed showing portal venous and mesenteric gas, highly concerning for mesenteric ischemia.  He was started on antibiotics and taken emergently to the OR by Dr. Christian Mate for an exploratory laparotomy. History was mostly obtained by chart review as the patient was transferred to the ICU intubated following his procedure.   Dr. Christian Mate noted multiple adhesions in the abdomen which were excised and lysed. A mild non-compromised transition area was noted, with the small bowel appearing healthy and well perfused, with no signs of discoloration pneumatosis or perforation. Similarly, the colon was healthy appearing. Following the procedure, he was transferred to the ICU for gradual extubation and further care.  Pertinent  Medical History  CAD s/p stenting to his RCA and LAD HFrEF, ischemic cardiomyopathy HTN HLD T2DM OSA on CPAP  Significant Hospital Events: Including procedures, antibiotic start and stop dates in addition to other pertinent events   10/15/2021: Exploratory laparotomy with lysis of adhesions, no       mesenteric ischemia noted 09/23/2021: Overnight pt with 450 ml of bloody OGT concerning       for possible GIB and worsening hyperkalemia  10/12/2021: Pt extubated to [place]  10/01/2021: CT Chest/Abd/Pelvis revealed Resolution of previously noted pneumatosis and portal venous gas portal venous gas. There are some persistent areas of small-bowel dilatation, with multiple ill-defined loops of small bowel and multiple air-fluid levels. This may simply represent a mild postoperative ileus, although persistent partial small bowel  obstruction is not entirely excluded. Trace volume of ascites. Dependent opacities in the lung bases bilaterally favored to predominantly reflect areas of subsegmental atelectasis and/or chronic scarring, although a small amount of airspace consolidation is not excluded in the posterior aspect of the right lower lobe. Aortic atherosclerosis, in addition to left main and three-vessel coronary artery disease. Assessment for potential risk factor modification, dietary therapy or pharmacologic therapy may be warranted, if clinically indicated. Additional incidental findings, as above. 10/02/2021: Pt developed worsening delirium/agitation overnight requiring precedex gtt.  Pt lethargic this am but able to follow commands with no signs of agitation.  Will attempt to wean off Bipap to HFNC once mentation improves.  Minimal NGT output overnight  10/03/2021- Trickle feeds initiated on 08/14 '@10'$  ml/hr.  However, overnight he pulled the NGT out.  This am he is having worsening abdominal distension and pain. KUB revealed ileus pattern with slightly progressing bowel gas pattern.  Orders placed to reinsert NGT once placement confirmed will place to LIS.  Intubated to allow time for ileus to resolve (see discussion 10/03/21 note) 8/18 remains on vent, ABD distended, failure to wean from vent 8/19 remains on vent, ABD distended, tolerated TF's 8/20 DID NOT TOLERATE TUBE FEEDS  Interim History / Subjective:  Remains critically ill ABD remains distended Failure to wean from vent      Objective   Blood pressure 138/68, pulse 76, temperature 100 F (37.8 C), resp. rate 18, height '5\' 11"'$  (1.803 m), weight 104.1 kg, SpO2 93 %.    Vent Mode: PRVC FiO2 (%):  [28 %-40 %] 28 % Set Rate:  [14 bmp] 14 bmp Vt Set:  [550 mL] 550 mL PEEP:  [5 DVV61-6  cmH20] 8 cmH20 Plateau Pressure:  [17 cmH20-20 cmH20] 17 cmH20   Intake/Output Summary (Last 24 hours) at 10/08/2021 0730 Last data filed at 10/08/2021 0631 Gross per 24 hour   Intake 3071.62 ml  Output 3050 ml  Net 21.62 ml    Filed Weights   10/06/21 0450 10/07/21 0600 10/08/21 0600  Weight: 105.7 kg 105.1 kg 104.1 kg     REVIEW OF SYSTEMS  PATIENT IS UNABLE TO PROVIDE COMPLETE REVIEW OF SYSTEMS DUE TO SEVERE CRITICAL ILLNESS    PHYSICAL EXAMINATION:  GENERAL:critically ill appearing, +resp distress EYES: Pupils equal, round, reactive to light.  No scleral icterus.  MOUTH: Moist mucosal membrane. INTUBATED NECK: Supple.  PULMONARY: +rhonchi, +wheezing CARDIOVASCULAR: S1 and S2.  No murmurs  GASTROINTESTINAL: +distended. negbowel sounds.  MUSCULOSKELETAL: No swelling, clubbing, or edema.  NEUROLOGIC: obtunded SKIN:intact,warm,dry       Assessment & Plan:   71 yo obese WM with  s/p exploratory laparotomy and LOA for CT findings concerning for SBO, pneumatosis, and pneumoperitoneum anticipated post-operative ileus 5 Days Post-Op associated with severe hypoxic/hypercapnic resp failure and failure to wean from vent  Severe ACUTE Hypoxic and Hypercapnic Respiratory Failure -continue Mechanical Ventilator support -Wean Fio2 and PEEP as tolerated -VAP/VENT bundle implementation - Wean PEEP & FiO2 as tolerated, maintain SpO2 > 88% - Head of bed elevated 30 degrees, VAP protocol in place - Plateau pressures less than 30 cm H20  - Intermittent chest x-ray & ABG PRN - Ensure adequate pulmonary hygiene  -will NOT perform SAT/SBT ABD remains distended, SEEMS to be tolerating TF's BUT UNSAFE for extubation until has better return of bowel function due to loss of FRC leading to atelectasis as well as his delirium requiring sedation meds which predispose him to CO2 retention Postoperative hypoxemic respiratory failure- due risk of aspiration (see discussion 10/03/21), decision made to keep on mechanical ventilation until return of bowel function.    ABDOMEN SBO with small bowel ischemia- s/p ex lap LOA 8/12 Postoperative ileus- follow up [place] TF's on hold now, ABD more distended   NEUROLOGY ACUTE METABOLIC ENCEPHALOPATHY Postoperative delirium- with background of alzheimer's and vascular dementia -need for sedation -Goal RASS -1  ACUTE KIDNEY INJURY/Renal Failure -continue Foley Catheter-assess need -Avoid nephrotoxic agents -Follow urine output, BMP -Ensure adequate renal perfusion, optimize oxygenation -Renal dose medications   Intake/Output Summary (Last 24 hours) at 10/08/2021 0732 Last data filed at 10/08/2021 0631 Gross per 24 hour  Intake 3071.62 ml  Output 3050 ml  Net 21.62 ml        Latest Ref Rng & Units 10/08/2021    5:25 AM 10/07/2021    4:43 AM 10/06/2021    5:48 AM  BMP  Glucose 70 - 99 mg/dL 230  220  272   BUN 8 - 23 mg/dL 73  63  62   Creatinine 0.61 - 1.24 mg/dL 1.41  1.26  1.28   Sodium 135 - 145 mmol/L 149  143  143   Potassium 3.5 - 5.1 mmol/L 5.3  5.0  5.2   Chloride 98 - 111 mmol/L 123  117  116   CO2 22 - 32 mmol/L '22  22  25   '$ Calcium 8.9 - 10.3 mg/dL 9.7  9.5  9.0    ACUTE ANEMIA- TRANSFUSE AS NEEDED CONSIDER TRANSFUSION  IF HGB<7   ENDO - ICU hypoglycemic\Hyperglycemia protocol -check FSBS per protocol   GI GI PROPHYLAXIS as indicated  NUTRITIONAL STATUS DIET-->TF's per GEN SUERGRY Constipation protocol  as indicated   ELECTROLYTES -follow labs as needed -replace as needed -pharmacy consultation and following    Best Practice (right click and "Reselect all SmartList Selections" daily)   Diet/type: TPN DVT prophylaxis: prophylactic heparin  GI prophylaxis: PPI Lines: PICC Foley:  Yes, and it is still needed: keep for a few more days    DVT/GI PRX  assessed I Assessed the need for Labs I Assessed the need for Foley I Assessed the need for Central Venous Line Family Discussion when available I Assessed the need for Mobilization I made an Assessment of medications to be adjusted accordingly Safety Risk assessment completed  CASE DISCUSSED IN  MULTIDISCIPLINARY ROUNDS WITH ICU TEAM     Critical Care Time devoted to patient care services described in this note is 45 minutes.  Critical care was necessary to treat /prevent imminent and life-threatening deterioration. Overall, patient is critically ill, prognosis is guarded.  Patient with Multiorgan failure and at high risk for cardiac arrest and death.    Corrin Parker, M.D.  [facility]  Medical Director [place] Director [facility] Cardio-Pulmonary Department

## 2021-10-08 NOTE — Plan of Care (Signed)
Continuing with plan of care. 

## 2021-10-08 NOTE — Progress Notes (Signed)
Post-op Illeus- resolving Hyperglycemia Patient tolerating TF at 40 mL/h, Glucose remains challenging to control. AM labs show hypernatremia & mild hyperkalemia Discussed TPN with Pharmacist.  - Long acting insulin & standing units increased > will closely follow CBG and adjust PRN due to TPN changes below - will decrease TPN rate by half to 40 mL/h overnight - will need to consider discontinuing tomorrow as patient is now tolerating TF's - f/u electrolytes in AM   Domingo Pulse [place], AGACNP-BC Acute Care Nurse Practitioner [place]   [phone] / [phone] Please see Amion for pager details.

## 2021-10-08 NOTE — Progress Notes (Signed)
Recheck residual, 10 ml's were aspirated and then reinstilled, restarted tubed feeds at 32m and hour. Will continue to monitor.

## 2021-10-08 NOTE — Progress Notes (Signed)
Residual check at 0030, 150 ml, notified NP, asked to pause tube feeds until at 0400 and then resume at 40 ml

## 2021-10-08 NOTE — Progress Notes (Signed)
Subjective:  CC: Todd Spencer is a 71 y.o. male  Hospital stay day 8, 8 Days Post-Op s/p exploratory laparotomy and LOA for CT findings concerning for SBO, pneumatosis, and pneumoperitoneum  HPI: No acute issues overnight per nursing report.  Now up to 3m/hr on tube feeds.  ROS:  Unable to obtain secondary to patient's status  Objective:   Temp:  [96.6 F (35.9 C)-100.9 F (38.3 C)] 100 F (37.8 C) (08/20 0600) Pulse Rate:  [69-102] 76 (08/20 0600) Resp:  [14-20] 18 (08/20 0600) BP: (103-148)/(52-75) 138/68 (08/20 0600) SpO2:  [86 %-96 %] 93 % (08/20 0600) FiO2 (%):  [28 %-40 %] 28 % (08/20 0347) Weight:  [104.1 kg] 104.1 kg (08/20 0600)     Height: '5\' 11"'$  (180.3 cm) Weight: 104.1 kg BMI (Calculated): 32.02   Intake/Output this shift:   Intake/Output Summary (Last 24 hours) at 10/08/2021 0810 Last data filed at 10/08/2021 0631 Gross per 24 hour  Intake 3071.62 ml  Output 3050 ml  Net 21.62 ml    Constitutional :  Intubated and sedated  Respiratory:  clear to auscultation bilaterally  Cardiovascular:  regular rate and rhythm  Gastrointestinal: Soft, no guarding, distended .   Skin: Cool and moist.  Staple line clean dry and intact.  Scant erythema surrounding the staple lines.  Psychiatric: Sedated       LABS:     Latest Ref Rng & Units 10/08/2021    5:25 AM 10/07/2021    4:43 AM 10/06/2021    5:48 AM  CMP  Glucose 70 - 99 mg/dL 230  220  272   BUN 8 - 23 mg/dL 73  63  62   Creatinine 0.61 - 1.24 mg/dL 1.41  1.26  1.28   Sodium 135 - 145 mmol/L 149  143  143   Potassium 3.5 - 5.1 mmol/L 5.3  5.0  5.2   Chloride 98 - 111 mmol/L 123  117  116   CO2 22 - 32 mmol/L '22  22  25   '$ Calcium 8.9 - 10.3 mg/dL 9.7  9.5  9.0       Latest Ref Rng & Units 10/08/2021    5:25 AM 10/07/2021    4:43 AM 10/05/2021    4:21 AM  CBC  WBC 4.0 - 10.5 K/uL 8.5  7.6  5.4   Hemoglobin 13.0 - 17.0 g/dL 9.9  9.4  10.2   Hematocrit 39.0 - 52.0 % 32.2  29.3  31.9   Platelets 150 - 400  K/uL 180  130  91     RADS: N/a Assessment:   S/ps/p exploratory laparotomy and LOA for CT findings concerning for SBO, pneumatosis, and pneumoperitoneum.  Continues to be stable from surgery standpoint.  Increase tube feeds as tolerated to goal rate.  Further care per ICU for his respiratory status.  Continue antibiotics for full course.  labs/images/medications/previous chart entries reviewed personally and relevant changes/updates noted above.

## 2021-10-08 NOTE — Consult Note (Signed)
PHARMACY - TOTAL PARENTERAL NUTRITION CONSULT NOTE   Indication: Prolonged ileus  Patient Measurements: Height: '5\' 11"'$  (180.3 cm) Weight: 104.1 kg (229 lb 8 oz) IBW/kg (Calculated) : 75.3   Body mass index is 32.01 kg/m.  Assessment:  Patient is a 71 y/o M with past medical history including CAD, ischemic cardiomyopathy, DM, HTN, CKD, OSA who presented to the ED 8/12 with abdominal pain, nausea, and vomiting. Imaging was concerning for mesenteric ischemia and patient was taken for emergent exploratory laparotomy with lysis of adhesions and no mesenteric ischemia noted. Patient was extubated 8/13. Post-operative course complicated by delirium, ongoing ileus. Given overall clinical picture, patient was re-intubated 8/15 and PICC was placed to start TPN 8/15.   Glucose / Insulin: History of DM (last Hgb A1c 7.1%). BG last 24h: 220 - 302. Currently on moderate SSI q4h + 8u q4h. 58u SSI required last 24h. 30u insulin in TPN Electrolytes: Mild hyperkalemia Renal: Scr 1.1 >> 1.46-->1.41) Hepatic: No transaminitis. LFTs normal Intake / Output; MIVF: I&O: + 2.1L GI Imaging: 8/12 CT abdomen / pelvis: Concerning for acute mesenteric ischemia. Extensive pneumatosis within several loops of small bowel within the central abdomen with mucosal hypoenhancement. Extensive mesenteric and portal venous gas. Small volume pneumoperitoneum.Multiple dilated loops of small bowel without abrupt transition point. It is uncertain if this is secondary to bowel ischemia or mechanical obstruction. 8/13 CT abdomen / pelvis: Resolution of previously noted pneumatosis and portal venous gas portal venous gas. There are some persistent areas of small-bowel dilatation, with multiple ill-defined loops of small bowel and multiple air-fluid levels. This may simply represent a mild postoperative ileus, although persistent partial small bowel obstruction is not entirely excluded. GI Surgeries / Procedures:  8/12: ExLap with lysis  of adhesions. No mesenteric ischemia found  Central access: 8/15 TPN start date: 8/15  Nutritional Goals: Goal TPN rate is 80 mL/hr (provides 150 g of protein and 1309 kcals per day)  RD Assessment: Estimated Needs Total Energy Estimated Needs: 1162-1480 Total Protein Estimated Needs: 150-160g/day Total Fluid Estimated Needs: 2.0L/day  Nutritional needs are expected to change when patient is extubated; see prior RD assessments  Current Nutrition:  NPO  Propofol discontinued 8/16 secondary to hypertriglyceridemia  Potential start enteral trickle feeds 8/18  Plan:  --Continue TPN to 80 mL/hr (full rate) at 1800 Lipids added to bag as TG continue to decrease(now<500) Expected content at full rate: *Anticipate minor logistical changes* Protein: 150 g Glucose: 96 g Lipids: 38.4 g  Kcal: 1309 --Electrolytes in TPN: Na 52mq/L, K 173m/L, Ca 23m723mL, Mg 23mE62m, and Phos 123mm28m. Cl:Ac 1:1 Add standard MVI and trace elements to TPN Thiamine 100 mg x 3 days completed Insulin and dextrose shifting therapy ordered for hyperkalemia --Increase to Resistant q4h SSI + 10 units q4h and adjust as needed  Due to increase from 5 units q4h to 10 units q4h scheduled, will only slightly increase insulin in TPN bag to 35u (from 30u) --Monitor TPN labs on Mon/Thurs, daily until stable  Drue Harr A Alexxander Kurt 10/08/2021,8:27 AM

## 2021-10-09 ENCOUNTER — Inpatient Hospital Stay: Payer: Medicare HMO

## 2021-10-09 DIAGNOSIS — R109 Unspecified abdominal pain: Secondary | ICD-10-CM | POA: Diagnosis not present

## 2021-10-09 DIAGNOSIS — K55059 Acute (reversible) ischemia of intestine, part and extent unspecified: Secondary | ICD-10-CM | POA: Diagnosis not present

## 2021-10-09 DIAGNOSIS — J9602 Acute respiratory failure with hypercapnia: Secondary | ICD-10-CM | POA: Diagnosis not present

## 2021-10-09 DIAGNOSIS — K559 Vascular disorder of intestine, unspecified: Secondary | ICD-10-CM | POA: Diagnosis not present

## 2021-10-09 DIAGNOSIS — J9601 Acute respiratory failure with hypoxia: Secondary | ICD-10-CM | POA: Diagnosis not present

## 2021-10-09 LAB — COMPREHENSIVE METABOLIC PANEL
ALT: 35 U/L (ref 0–44)
AST: 31 U/L (ref 15–41)
Albumin: 2.4 g/dL — ABNORMAL LOW (ref 3.5–5.0)
Alkaline Phosphatase: 64 U/L (ref 38–126)
Anion gap: 5 (ref 5–15)
BUN: 80 mg/dL — ABNORMAL HIGH (ref 8–23)
CO2: 21 mmol/L — ABNORMAL LOW (ref 22–32)
Calcium: 9.7 mg/dL (ref 8.9–10.3)
Chloride: 124 mmol/L — ABNORMAL HIGH (ref 98–111)
Creatinine, Ser: 1.51 mg/dL — ABNORMAL HIGH (ref 0.61–1.24)
GFR, Estimated: 49 mL/min — ABNORMAL LOW (ref >60)
Glucose, Bld: 253 mg/dL — ABNORMAL HIGH (ref 70–99)
Potassium: 5.3 mmol/L — ABNORMAL HIGH (ref 3.5–5.1)
Sodium: 150 mmol/L — ABNORMAL HIGH (ref 135–145)
Total Bilirubin: 0.3 mg/dL (ref 0.3–1.2)
Total Protein: 6.9 g/dL (ref 6.5–8.1)

## 2021-10-09 LAB — GLUCOSE, CAPILLARY
Glucose-Capillary: 206 mg/dL — ABNORMAL HIGH (ref 70–99)
Glucose-Capillary: 214 mg/dL — ABNORMAL HIGH (ref 70–99)
Glucose-Capillary: 254 mg/dL — ABNORMAL HIGH (ref 70–99)
Glucose-Capillary: 248 mg/dL — ABNORMAL HIGH (ref 70–99)
Glucose-Capillary: 251 mg/dL — ABNORMAL HIGH (ref 70–99)
Glucose-Capillary: 236 mg/dL — ABNORMAL HIGH (ref 70–99)
Glucose-Capillary: 223 mg/dL — ABNORMAL HIGH (ref 70–99)
Glucose-Capillary: 207 mg/dL — ABNORMAL HIGH (ref 70–99)
Glucose-Capillary: 194 mg/dL — ABNORMAL HIGH (ref 70–99)
Glucose-Capillary: 201 mg/dL — ABNORMAL HIGH (ref 70–99)
Glucose-Capillary: 173 mg/dL — ABNORMAL HIGH (ref 70–99)

## 2021-10-09 LAB — CBC WITH DIFFERENTIAL/PLATELET
Abs Immature Granulocytes: 0.49 10*3/uL — ABNORMAL HIGH (ref 0.00–0.07)
Basophils Absolute: 0.1 10*3/uL (ref 0.0–0.1)
Basophils Relative: 1 %
Eosinophils Absolute: 0.1 10*3/uL (ref 0.0–0.5)
Eosinophils Relative: 1 %
HCT: 34.3 % — ABNORMAL LOW (ref 39.0–52.0)
Hemoglobin: 10.2 g/dL — ABNORMAL LOW (ref 13.0–17.0)
Immature Granulocytes: 4 %
Lymphocytes Relative: 8 %
Lymphs Abs: 0.9 10*3/uL (ref 0.7–4.0)
MCH: 28.7 pg (ref 26.0–34.0)
MCHC: 29.7 g/dL — ABNORMAL LOW (ref 30.0–36.0)
MCV: 96.3 fL (ref 80.0–100.0)
Monocytes Absolute: 0.7 10*3/uL (ref 0.1–1.0)
Monocytes Relative: 6 %
Neutro Abs: 9.1 10*3/uL — ABNORMAL HIGH (ref 1.7–7.7)
Neutrophils Relative %: 80 %
Platelets: 207 10*3/uL (ref 150–400)
RBC: 3.56 MIL/uL — ABNORMAL LOW (ref 4.22–5.81)
RDW: 16 % — ABNORMAL HIGH (ref 11.5–15.5)
WBC: 11.3 10*3/uL — ABNORMAL HIGH (ref 4.0–10.5)
nRBC: 0 % (ref 0.0–0.2)

## 2021-10-09 LAB — PHOSPHORUS: Phosphorus: 6 mg/dL — ABNORMAL HIGH (ref 2.5–4.6)

## 2021-10-09 LAB — PROCALCITONIN: Procalcitonin: 0.54 ng/mL

## 2021-10-09 LAB — MAGNESIUM: Magnesium: 2 mg/dL (ref 1.7–2.4)

## 2021-10-09 LAB — TRIGLYCERIDES: Triglycerides: 411 mg/dL — ABNORMAL HIGH (ref <150)

## 2021-10-09 MED ORDER — PROSOURCE TF20 ENFIT COMPATIBL EN LIQD
60.0000 mL | Freq: Every day | ENTERAL | Status: DC
Start: 2021-10-10 — End: 2021-10-11
  Administered 2021-10-10 – 2021-10-11 (×2): 60 mL
  Filled 2021-10-09: qty 60

## 2021-10-09 MED ORDER — FREE WATER
100.0000 mL | Status: DC
Start: 2021-10-09 — End: 2021-10-10
  Administered 2021-10-09 – 2021-10-10 (×5): 100 mL

## 2021-10-09 MED ORDER — TRAVASOL 10 % IV SOLN
INTRAVENOUS | Status: DC
  Filled 2021-10-09: qty 748.8

## 2021-10-09 MED ORDER — DIAZEPAM 5 MG PO TABS
5.0000 mg | ORAL_TABLET | Freq: Four times a day (QID) | ORAL | Status: DC
  Administered 2021-10-09 – 2021-10-11 (×8): 5 mg
  Filled 2021-10-09 (×8): qty 1

## 2021-10-09 MED ORDER — VITAL HIGH PROTEIN PO LIQD
1000.0000 mL | ORAL | Status: DC
  Administered 2021-10-09 (×2): 1000 mL

## 2021-10-09 MED ORDER — FENTANYL BOLUS VIA INFUSION
100.0000 ug | Freq: Once | INTRAVENOUS | Status: AC
  Administered 2021-10-09: 100 ug via INTRAVENOUS
  Filled 2021-10-09: qty 100

## 2021-10-09 MED ORDER — IPRATROPIUM-ALBUTEROL 0.5-2.5 (3) MG/3ML IN SOLN
3.0000 mL | RESPIRATORY_TRACT | Status: DC | PRN
Start: 2021-10-09 — End: 2021-10-18

## 2021-10-09 MED ORDER — VECURONIUM BROMIDE 10 MG IV SOLR
10.0000 mg | Freq: Once | INTRAVENOUS | Status: AC
Start: 2021-10-09 — End: 2021-10-09
  Administered 2021-10-09: 10 mg via INTRAVENOUS
  Filled 2021-10-09: qty 10

## 2021-10-09 MED ORDER — OXYCODONE HCL 5 MG PO TABS
5.0000 mg | ORAL_TABLET | Freq: Four times a day (QID) | ORAL | Status: DC
  Administered 2021-10-09 – 2021-10-11 (×8): 5 mg
  Filled 2021-10-09 (×8): qty 1

## 2021-10-09 MED ORDER — MIDAZOLAM BOLUS VIA INFUSION
4.0000 mg | Freq: Once | INTRAVENOUS | Status: AC
  Administered 2021-10-09: 4 mg via INTRAVENOUS
  Filled 2021-10-09: qty 4

## 2021-10-09 MED ORDER — CHLORHEXIDINE GLUCONATE CLOTH 2 % EX PADS
6.0000 | MEDICATED_PAD | Freq: Every day | CUTANEOUS | Status: DC
  Administered 2021-10-09 – 2021-10-17 (×8): 6 via TOPICAL

## 2021-10-09 NOTE — Consult Note (Signed)
PHARMACY - TOTAL PARENTERAL NUTRITION CONSULT NOTE   Indication: Prolonged ileus  Patient Measurements: Height: '5\' 11"'$  (180.3 cm) Weight: 104.1 kg (229 lb 8 oz) IBW/kg (Calculated) : 75.3   Body mass index is 32.01 kg/m.  Assessment:  Patient is a 71 y/o M with past medical history including CAD, ischemic cardiomyopathy, DM, HTN, CKD, OSA who presented to the ED 8/12 with abdominal pain, nausea, and vomiting. Imaging was concerning for mesenteric ischemia and patient was taken for emergent exploratory laparotomy with lysis of adhesions and no mesenteric ischemia noted. Patient was extubated 8/13. Post-operative course complicated by delirium, ongoing ileus. Given overall clinical picture, patient was re-intubated 8/15 and PICC was placed to start TPN 8/15.   Glucose / Insulin: History of DM (last Hgb A1c 7.1%). BG last 24h: 223 - 282. Currently on moderate SSI q4h + 8u q4h. 74u SSI required last 24h. 30u insulin in TPN Electrolytes: Mild hyperkalemia Renal: Scr 1.1 >> 1.46-->1.51) Hepatic: No transaminitis. LFTs normal Intake / Output; MIVF: I&O: + 3.2L GI Imaging: 8/12 CT abdomen / pelvis: Concerning for acute mesenteric ischemia. Extensive pneumatosis within several loops of small bowel within the central abdomen with mucosal hypoenhancement. Extensive mesenteric and portal venous gas. Small volume pneumoperitoneum.Multiple dilated loops of small bowel without abrupt transition point. It is uncertain if this is secondary to bowel ischemia or mechanical obstruction. 8/13 CT abdomen / pelvis: Resolution of previously noted pneumatosis and portal venous gas portal venous gas. There are some persistent areas of small-bowel dilatation, with multiple ill-defined loops of small bowel and multiple air-fluid levels. This may simply represent a mild postoperative ileus, although persistent partial small bowel obstruction is not entirely excluded. GI Surgeries / Procedures:  8/12: ExLap with lysis  of adhesions. No mesenteric ischemia found  Central access: 8/15 TPN start date: 8/15  Nutritional Goals: Goal TPN rate is 80 mL/hr (provides 150 g of protein and 1309 kcals per day)  RD Assessment: Estimated Needs Total Energy Estimated Needs: 1162-1480 Total Protein Estimated Needs: 150-160g/day Total Fluid Estimated Needs: 2.0L/day  Nutritional needs are expected to change when patient is extubated; see prior RD assessments  Current Nutrition:  NPO  Propofol discontinued 8/16 secondary to hypertriglyceridemia  Plan:  --Continue TPN at 40 mL/hr (plan to stop when tube feeds at goal rate) Lipids added to bag as TG <500 --Electrolytes in TPN: Na 26mq/L, K 562m/L, Ca 3m72mL, Mg 3mE36m, and Phos 13mm31m. Cl:Ac 1:1.92 Add standard MVI and trace elements to TPN Thiamine 100 mg x 3 days completed Insulin and dextrose shifting therapy ordered for hyperkalemia --continue Resistant q4h SSI + 12 units q4h and adjust as needed  Continue insulin in TPN bag at 35 units Increase free water to 100 mL q4h --Monitor TPN labs on Mon/Thurs, daily until stable  RodneDallie Piles/2023,6:50 AM

## 2021-10-09 NOTE — IPAL (Addendum)
  Interdisciplinary Goals of Care Family Meeting   Date carried out: 10/09/2021  Location of the meeting: Phone conference  Member's involved: Physician, Nurse Practitioner, and Family Member or next of kin       GOALS OF CARE DISCUSSION  The Clinical status was relayed to family in detail-Cousin [place], Mother Tonia [place]     Updated and notified of patients medical condition- Patient remains unresponsive and will not open eyes to command.   Patient is having a weak cough and struggling to remove secretions.   Patient with increased WOB and using accessory muscles to breathe Explained to family course of therapy and the modalities   PATIENT REMAINS FULL CODE  Family understands the situation. Recommend bedside assessment of family in next 24-48 hrs to assess breathing trials  Family are satisfied with Plan of action and management. All questions answered  Additional CC time 25 mins   Johntavious Francom Patricia Pesa, M.D.  [facility]  Medical Director [place] Director [facility] Cardio-Pulmonary Department

## 2021-10-09 NOTE — Progress Notes (Signed)
NP notified that tube feed residual was 100 ml.Verbal order received to reduce tube feed back to 40 ml/hr.

## 2021-10-09 NOTE — Progress Notes (Signed)
[place] Hospital Day(s): 9.   Post op day(s): 9 Days Post-Op.   Interval History:  Patient seen and examined Remains off vasopressor support Overnight, issues with ventilator, resolved this morning Fevers in last 24 hours; T-Max 101.85F Again with leukocytosis; 11.3K Renal function worsening; sCr - 1.51; UO - 3250 ccs Hyperkalemia to 5.2 Hypertriglyceridemia improved; 411 On tube feeds; 40 ml/hr Also on TPN; 1/2 rate   Vital signs in last 24 hours: [min-max] current  Temp:  [99.5 F (37.5 C)-101.1 F (38.4 C)] 99.5 F (37.5 C) (08/21 0630) Pulse Rate:  [72-124] 76 (08/21 0630) Resp:  [7-21] 14 (08/21 0630) BP: (109-186)/(61-91) 109/65 (08/21 0630) SpO2:  [92 %-99 %] 97 % (08/21 0630) FiO2 (%):  [28 %-45 %] 45 % (08/21 0521)     Height: '5\' 11"'$  (180.3 cm) Weight: 104.1 kg BMI (Calculated): 32.02   Intake/Output last 2 shifts:  08/20 0701 - 08/21 0700 In: 4446.4 [I.V.:2573.8; NG/GT:1675.8; IV Piggyback:196.8] Out: 8242 [Urine:3250]   Physical Exam:  Constitutional: Sedated, intubated  HEENT: NGT in place; on tube feedings  Respiratory: On ventilator  Cardiovascular: regular rate and sinus rhythm  Gastrointestinal: Soft, distension improving. Unable to assess tenderness  Genitourinary: Foley in place; clear urine.  Integumentary: Laparotomy is CDI with staples and honeycomb; no erythema, no drainage  Labs:     Latest Ref Rng & Units 10/09/2021    5:10 AM 10/08/2021    5:25 AM 10/07/2021    4:43 AM  CBC  WBC 4.0 - 10.5 K/uL 11.3  8.5  7.6   Hemoglobin 13.0 - 17.0 g/dL 10.2  9.9  9.4   Hematocrit 39.0 - 52.0 % 34.3  32.2  29.3   Platelets 150 - 400 K/uL 207  180  130       Latest Ref Rng & Units 10/09/2021    5:10 AM 10/08/2021    5:25 AM 10/07/2021    4:43 AM  CMP  Glucose 70 - 99 mg/dL 253  230  220   BUN 8 - 23 mg/dL 80  73  63   Creatinine 0.61 - 1.24 mg/dL 1.51  1.41  1.26   Sodium 135 - 145 mmol/L 150  149  143    Potassium 3.5 - 5.1 mmol/L 5.3  5.3  5.0   Chloride 98 - 111 mmol/L 124  123  117   CO2 22 - 32 mmol/L '21  22  22   '$ Calcium 8.9 - 10.3 mg/dL 9.7  9.7  9.5   Total Protein 6.5 - 8.1 g/dL 6.9     Total Bilirubin 0.3 - 1.2 mg/dL 0.3     Alkaline Phos 38 - 126 U/L 64     AST 15 - 41 U/L 31     ALT 0 - 44 U/L 35        Imaging studies: No new pertinent imaging studies   Assessment/Plan:  71 y.o. male with anticipated post-operative ileus 9 Days Post-Op s/p exploratory laparotomy and LOA for CT findings concerning for SBO, pneumatosis, and pneumoperitoneum   - Continue enteric feedings; advance to goal; monitor fo bowel function   - Continue TPN for now; weaning for enteric feeding advancement; okay to DC if tolerating goal enteric feeds   - Foley catheter per primary service; good UO - Monitor abdominal examination; on-going bowel function   - Can consider serial KUBs prn  - Monitor renal function  - Monitor leukocytosis; if continues to worsen with  fevers, may need to consider repeat imaging in next 24 hours   - OOB once feasible  - Further management per primary service; we will of course follow allong  All of the above findings and recommendations were discussed with the medical team, family present at bedside.   -- Edison Simon, PA-C [facility] 10/09/2021, 7:38 AM M-F: 7am - 4pm

## 2021-10-09 NOTE — Progress Notes (Signed)
Acute Hypoxic Respiratory Failure Called bedside by nursing and RT due to patient's hypoxia 76% on the ventilator with poor volumes and asynchrony.  - sedation adjusted, boluses given, eventually vecuronium IVP given without effect - patient required BVM support, with improvement in SpO2 & HR multiple times - ventilator troubleshooted and eventually switched out without effect - CXR shows ETT in correct position and cuff appears intact - breath sounds diminished with some expiratory wheezing, duo neb administered - when BVM support provided, can hear some secretions >> aggressive lavage & suction performed by RT with resolution of symptoms.....Marland Kitchensuspect partial mucus plug that was shifted with lavage efforts. - if issue reoccurs patient may need bedside bronchoscopy - Q 4 CPT ordered, aggressive pulmonary toileting   Additional Critical Care time 30 minutes  Venetia Night, AGACNP-BC Acute Care Nurse Practitioner [place] [facility]   [phone] / [phone] Please see Amion for pager details.

## 2021-10-09 NOTE — Plan of Care (Signed)

## 2021-10-09 NOTE — TOC Initial Note (Signed)
Transition of Care [facility]) - Initial/Assessment Note    Patient Details  Name: Todd Spencer MRN: [id number] Date of Birth: 27-Mar-1950  Transition of [facility]) CM/SW Contact:    Shelbie Hutching, RN Phone Number: 10/09/2021, 3:22 PM  Clinical Narrative:                 Patient admitted to the hospital with SBO.  Patient is from home where he lives alone and is independent at baseline.  OP Palliative has been following him at home with [facility].  Patient is currently in the ICU intubated and sedated, failed extubation once.   TOC will follow and assist with disposition, discharge plan TBD depending on medical progression and prognosis.   Expected Discharge Plan:  (TBD) Barriers to Discharge: Continued Medical Work up   Patient Goals and CMS Choice Patient states their goals for this hospitalization and ongoing recovery are:: unable to states goals, intubated and sedated      Expected Discharge Plan and Services Expected Discharge Plan:  (TBD)   Discharge Planning Services: CM Consult   Living arrangements for the past 2 months: Single Family Home                                      Prior Living Arrangements/Services Living arrangements for the past 2 months: Single Family Home Lives with:: Self Patient language and need for interpreter reviewed:: Yes Do you feel safe going back to the place where you live?: Yes      Need for Family Participation in Patient Care: Yes (Comment) Care giver support system in place?: Yes (comment)   Criminal Activity/Legal Involvement Pertinent to Current Situation/Hospitalization: No - Comment as needed  Activities of Daily Living      Permission Sought/Granted                  Emotional Assessment   Attitude/Demeanor/Rapport: Intubated (Following Commands or Not Following Commands) Affect (typically observed): Unable to Assess   Alcohol / Substance Use: Not Applicable Psych Involvement: No (comment)  Admission  diagnosis:  Acute mesenteric ischemia (HCC) [K55.059] Mesenteric ischemia (HCC) [K55.9] Abdominal pain, unspecified abdominal location [R10.9] Bowel obstruction ([place]) [K56.609] Patient Active Problem List   Diagnosis Date Noted   Abdominal pain    Bowel obstruction ([place]) 09/29/2021   Acute respiratory failure with hypoxia and hypercapnia (HCC) 10/03/2021   Hyperkalemia 10/04/2021   Hypoglycemia 06/05/2021   SBO (small bowel obstruction) ([place]) 09/15/2020   AKI (acute kidney injury) ([place]) 02/16/2020   S/P TKR (total knee replacement) using cement, right 02/11/2020   Acute appendicitis 03/30/2019   Status post total knee replacement using cement, left 10/08/2017   Small bowel obstruction, recurrent ([place]) 10/27/2016   Insulin overdose 01/22/2016   CKD (chronic kidney disease) stage 3, GFR 30-59 ml/min (HCC) 12/12/2015   Diabetic peripheral neuropathy ([place]) 12/12/2015   DM2 (diabetes mellitus, type 2) ([place]) 12/12/2015   Small bowel obstruction due to adhesions ([place]) 12/12/2015   Intestinal adhesions with obstruction (HCC)    Obesity, Class I, BMI 30-34.9 05/11/2015   Mixed Alzheimer's and vascular dementia ([place]) 05/04/2015   Uncontrolled diabetes mellitus type 2 without complications 35/32/9924   Cardiomyopathy, ischemic 26/83/4196   Chronic systolic heart failure secondary to ischemic cardiomyopathy ([place]) 11/04/2014   Coronary artery disease 11/04/2014   SOB (shortness of breath) on exertion 04/26/2014   Cardiomyopathy ([place]) 04/23/2014   H/O  cardiac catheterization 04/23/2014   H/O degenerative disc disease 04/23/2014   History of PTCA 04/23/2014   HTN (hypertension) 04/23/2014   Hyperlipidemia, unspecified 04/23/2014   MI (myocardial infarction) ([place]) 04/23/2014   OSA (obstructive sleep apnea) 04/23/2014   PCP:  Maryland Pink, MD Pharmacy:   [id number] [address], [place] [place]  [address] Phone: [phone] Fax: [phone]  [facility] [place], [address] [place] [address] Phone: [phone] Fax: [phone]     Social Determinants of Health (SDOH) Interventions    Readmission Risk Interventions     No data to display

## 2021-10-09 NOTE — Progress Notes (Signed)
NAME:  Todd Spencer, MRN:  [id number], DOB:  07-06-1950, LOS: 76 ADMISSION DATE:  10/11/2021, CONSULTATION DATE: 10/03/2021  History of Present Illness/SYNOPSIS  71 year old male with a history of CAD, ischemic cardiomyopathy, type 2 diabetes, HTN, CKD, and OSA who presented to the ED with abdominal pain, nausea and vomiting. CT scan of the abdomen was performed showing portal venous and mesenteric gas, highly concerning for mesenteric ischemia.  He was started on antibiotics and taken emergently to the OR by Dr. Christian Mate for an exploratory laparotomy. History was mostly obtained by chart review as the patient was transferred to the ICU intubated following his procedure.   Dr. Christian Mate noted multiple adhesions in the abdomen which were excised and lysed. A mild non-compromised transition area was noted, with the small bowel appearing healthy and well perfused, with no signs of discoloration pneumatosis or perforation. Similarly, the colon was healthy appearing. Following the procedure, he was transferred to the ICU for gradual extubation and further care.  Pertinent  Medical History  CAD s/p stenting to his RCA and LAD HFrEF, ischemic cardiomyopathy HTN HLD T2DM OSA on CPAP  Significant Hospital Events: Including procedures, antibiotic start and stop dates in addition to other pertinent events   10/07/2021: Exploratory laparotomy with lysis of adhesions, no       mesenteric ischemia noted 10/05/2021: Overnight pt with 450 ml of bloody OGT concerning       for possible GIB and worsening hyperkalemia  09/19/2021: Pt extubated to [place]  10/01/2021: CT Chest/Abd/Pelvis revealed Resolution of previously noted pneumatosis and portal venous gas portal venous gas. There are some persistent areas of small-bowel dilatation, with multiple ill-defined loops of small bowel and multiple air-fluid levels. This may simply represent a mild postoperative ileus, although persistent partial small bowel  obstruction is not entirely excluded. Trace volume of ascites. Dependent opacities in the lung bases bilaterally favored to predominantly reflect areas of subsegmental atelectasis and/or chronic scarring, although a small amount of airspace consolidation is not excluded in the posterior aspect of the right lower lobe. Aortic atherosclerosis, in addition to left main and three-vessel coronary artery disease. Assessment for potential risk factor modification, dietary therapy or pharmacologic therapy may be warranted, if clinically indicated. Additional incidental findings, as above. 10/02/2021: Pt developed worsening delirium/agitation overnight requiring precedex gtt.  Pt lethargic this am but able to follow commands with no signs of agitation.  Will attempt to wean off Bipap to HFNC once mentation improves.  Minimal NGT output overnight  10/03/2021- Trickle feeds initiated on 08/14 '@10'$  ml/hr.  However, overnight he pulled the NGT out.  This am he is having worsening abdominal distension and pain. KUB revealed ileus pattern with slightly progressing bowel gas pattern.  Orders placed to reinsert NGT once placement confirmed will place to LIS.  Intubated to allow time for ileus to resolve (see discussion 10/03/21 note) 8/18 remains on vent, ABD distended, failure to wean from vent 8/19 remains on vent, ABD distended, tolerated TF's 8/20 DID NOT TOLERATE TUBE FEEDS 8/21 severe hypoxia, possible mucuc plugs  Interim History / Subjective:  Remains critically ill TF at 36m/hr Failure to wean from vent Family not available last several days Renal unction worsening +hyperkalemia       Objective   Blood pressure 122/71, pulse 89, temperature 99.5 F (37.5 C), resp. rate 12, height '5\' 11"'$  (1.803 m), weight 104.1 kg, SpO2 98 %.    Vent Mode: PRVC FiO2 (%):  [28 %-45 %] 35 % Set  Rate:  [14 bmp] 14 bmp Vt Set:  [550 mL] 550 mL PEEP:  [5 cmH20] 5 cmH20 Plateau Pressure:  [13 cmH20-17 cmH20] 13 cmH20    Intake/Output Summary (Last 24 hours) at 10/09/2021 0920 Last data filed at 10/09/2021 0800 Gross per 24 hour  Intake 4066.64 ml  Output 2950 ml  Net 1116.64 ml    Filed Weights   10/06/21 0450 10/07/21 0600 10/08/21 0600  Weight: 105.7 kg 105.1 kg 104.1 kg    REVIEW OF SYSTEMS  PATIENT IS UNABLE TO PROVIDE COMPLETE REVIEW OF SYSTEMS DUE TO SEVERE CRITICAL ILLNESS    PHYSICAL EXAMINATION:  GENERAL:critically ill appearing, EYES: Pupils equal, round, reactive to light.  No scleral icterus.  MOUTH: Moist mucosal membrane. INTUBATED NECK: Supple.  PULMONARY: +rhonchi,  CARDIOVASCULAR: S1 and S2.  No murmurs  GASTROINTESTINAL: +distended. No bowel sounds.  Laparotomy is CDI with staples and honeycomb; no erythema, no drainage MUSCULOSKELETAL: No swelling, clubbing, or edema.  NEUROLOGIC: obtunded SKIN:intact,warm,dry         Assessment & Plan:   71 yo obese WM with  s/p exploratory laparotomy and LOA for CT findings concerning for SBO, pneumatosis, and pneumoperitoneum anticipated post-operative ileus 5 Days Post-Op associated with severe hypoxic/hypercapnic resp failure and failure to wean from vent  Severe ACUTE Hypoxic and Hypercapnic Respiratory Failure -continue Mechanical Ventilator support -Wean Fio2 and PEEP as tolerated -VAP/VENT bundle implementation - Wean PEEP & FiO2 as tolerated, maintain SpO2 > 88% - Head of bed elevated 30 degrees, VAP protocol in place - Plateau pressures less than 30 cm H20  - Intermittent chest x-ray & ABG PRN - Ensure adequate pulmonary hygiene  -will NOT perform SAT/SBT ABD remains distended, SEEMS to be tolerating TF's BUT UNSAFE for extubation until has better return of bowel function due to loss of FRC leading to atelectasis as well as his delirium requiring sedation meds which predispose him to CO2 retention Postoperative hypoxemic respiratory failure- due risk of aspiration (see discussion 10/03/21), decision made to keep on  mechanical ventilation until return of bowel function.    ABDOMEN SBO with small bowel ischemia- s/p ex lap LOA 8/12 Postoperative ileus- follow up [place] TF's at 40, will need to get to goal, ABD  less  distended   NEUROLOGY ACUTE METABOLIC ENCEPHALOPATHY Postoperative delirium- with background of alzheimer's and vascular dementia -need for sedation -Goal RASS -1   ACUTE KIDNEY INJURY/Renal Failure -continue Foley Catheter-assess need -Avoid nephrotoxic agents -Follow urine output, BMP -Ensure adequate renal perfusion, optimize oxygenation -Renal dose medications   Intake/Output Summary (Last 24 hours) at 10/09/2021 0924 Last data filed at 10/09/2021 0900 Gross per 24 hour  Intake 4208.98 ml  Output 3610 ml  Net 598.98 ml      Latest Ref Rng & Units 10/09/2021    5:10 AM 10/08/2021    5:25 AM 10/07/2021    4:43 AM  BMP  Glucose 70 - 99 mg/dL 253  230  220   BUN 8 - 23 mg/dL 80  73  63   Creatinine 0.61 - 1.24 mg/dL 1.51  1.41  1.26   Sodium 135 - 145 mmol/L 150  149  143   Potassium 3.5 - 5.1 mmol/L 5.3  5.3  5.0   Chloride 98 - 111 mmol/L 124  123  117   CO2 22 - 32 mmol/L '21  22  22   '$ Calcium 8.9 - 10.3 mg/dL 9.7  9.7  9.5     ACUTE ANEMIA- TRANSFUSE  AS NEEDED CONSIDER TRANSFUSION  IF HGB<7   ENDO - ICU hypoglycemic\Hyperglycemia protocol -check FSBS per protocol   GI GI PROPHYLAXIS as indicated  NUTRITIONAL STATUS DIET-->TF's goal is 60 Constipation protocol as indicated   ELECTROLYTES -follow labs as needed -replace as needed -pharmacy consultation and following   Best Practice (right click and "Reselect all SmartList Selections" daily)   Diet/type: TPN DVT prophylaxis: prophylactic heparin  GI prophylaxis: PPI Lines: PICC Foley:  Yes, and it is still needed: keep for a few more days    DVT/GI PRX  assessed I Assessed the need for Labs I Assessed the need for Foley I Assessed the need for Central Venous Line Family  Discussion when available I Assessed the need for Mobilization I made an Assessment of medications to be adjusted accordingly Safety Risk assessment completed  CASE DISCUSSED IN MULTIDISCIPLINARY ROUNDS WITH ICU TEAM     Critical Care Time devoted to patient care services described in this note is 45 minutes.  Critical care was necessary to treat /prevent imminent and life-threatening deterioration.   Corrin Parker, M.D.  [facility]  Medical Director [place] Director [facility] Cardio-Pulmonary Department

## 2021-10-09 NOTE — Progress Notes (Signed)
Called to patient room by RN and found patient desaturating and double-triggering the ventilator. Patient ventilated with ambu by RN while RRT attempted to troubleshoot problem. Circuit examined, ventilator changed, and ETT sxn'd. Eventually, after extensive sxn and lavage, saturation improved and double-triggering ceased. Patient left on below settings.   10/09/21 0521  Vent Select  Invasive or Noninvasive Invasive  Adult Vent Y  Airway 8 mm  Placement Date/Time: 10/03/21 1000   Placed By: ICU physician  Airway Device: Endotracheal Tube  Laryngoscope Blade: 4  ETT Types: Oral  Size (mm): 8 mm  Cuffed: Cuffed  Insertion attempts: 1  Airway Equipment: Stylet;Video Laryngoscope  Placement Con...  Secured at (cm) 24 cm  Measured From Lips  Secured Location (S)  Right  Secured By Charity fundraiser  Prone position No  Cuff Pressure (cm H2O) Green OR 18-26 CmH2O  Site Condition Dry  Adult Ventilator Settings  Vent Type Servo i  Humidity HME  Vent Mode PRVC  Vt Set 550 mL  Ve 7.7 mL  Set Rate 14 bmp  FiO2 (%) 45 %  I Time 1 Sec(s)  I:E Ratio Set 1:3.3  PEEP 5 cmH20  Sensitivity Positive 3  Adult Ventilator Measurements  Peak Airway Pressure 13 L/min  Mean Airway Pressure 8 cmH20  Plateau Pressure 13 cmH20  Resp Rate Total 18 br/min  Exhaled Vt 589 mL  Measured Ve 8.5 mL  I:E Ratio Measured 1:2.3  SpO2 94 %  Adult Ventilator Alarms  Alarms On Y  Ve High Alarm 15 L/min  Ve Low Alarm 4 L/min  Resp Rate High Alarm 40 br/min  Resp Rate Low Alarm 5  PEEP Low Alarm 3 cmH2O  Press High Alarm 30 cmH2O  VAP Prevention  HOB> 30 Degrees Y  Breath Sounds  Bilateral Breath Sounds Diminished;Rhonchi  Vent Respiratory Assessment  Respiratory Pattern Regular;Unlabored;Symmetrical
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
## 2021-10-10 ENCOUNTER — Other Ambulatory Visit: Payer: Self-pay

## 2021-10-10 ENCOUNTER — Inpatient Hospital Stay: Payer: Medicare HMO

## 2021-10-10 DIAGNOSIS — K55059 Acute (reversible) ischemia of intestine, part and extent unspecified: Secondary | ICD-10-CM | POA: Diagnosis not present

## 2021-10-10 DIAGNOSIS — K559 Vascular disorder of intestine, unspecified: Secondary | ICD-10-CM | POA: Diagnosis not present

## 2021-10-10 DIAGNOSIS — R109 Unspecified abdominal pain: Secondary | ICD-10-CM | POA: Diagnosis not present

## 2021-10-10 LAB — CBC WITH DIFFERENTIAL/PLATELET
Abs Immature Granulocytes: 0.19 10*3/uL — ABNORMAL HIGH (ref 0.00–0.07)
Basophils Absolute: 0 10*3/uL (ref 0.0–0.1)
Basophils Relative: 0 %
Eosinophils Absolute: 0.2 10*3/uL (ref 0.0–0.5)
Eosinophils Relative: 2 %
HCT: 31 % — ABNORMAL LOW (ref 39.0–52.0)
Hemoglobin: 9.1 g/dL — ABNORMAL LOW (ref 13.0–17.0)
Immature Granulocytes: 2 %
Lymphocytes Relative: 6 %
Lymphs Abs: 0.6 10*3/uL — ABNORMAL LOW (ref 0.7–4.0)
MCH: 29.1 pg (ref 26.0–34.0)
MCHC: 29.4 g/dL — ABNORMAL LOW (ref 30.0–36.0)
MCV: 99 fL (ref 80.0–100.0)
Monocytes Absolute: 0.7 10*3/uL (ref 0.1–1.0)
Monocytes Relative: 7 %
Neutro Abs: 7.9 10*3/uL — ABNORMAL HIGH (ref 1.7–7.7)
Neutrophils Relative %: 83 %
Platelets: 175 10*3/uL (ref 150–400)
RBC: 3.13 MIL/uL — ABNORMAL LOW (ref 4.22–5.81)
RDW: 16.4 % — ABNORMAL HIGH (ref 11.5–15.5)
WBC: 9.6 10*3/uL (ref 4.0–10.5)
nRBC: 0 % (ref 0.0–0.2)

## 2021-10-10 LAB — BASIC METABOLIC PANEL
Anion gap: 3 — ABNORMAL LOW (ref 5–15)
BUN: 84 mg/dL — ABNORMAL HIGH (ref 8–23)
CO2: 22 mmol/L (ref 22–32)
Calcium: 9.5 mg/dL (ref 8.9–10.3)
Chloride: 126 mmol/L — ABNORMAL HIGH (ref 98–111)
Creatinine, Ser: 1.79 mg/dL — ABNORMAL HIGH (ref 0.61–1.24)
GFR, Estimated: 40 mL/min — ABNORMAL LOW (ref >60)
Glucose, Bld: 244 mg/dL — ABNORMAL HIGH (ref 70–99)
Potassium: 5.1 mmol/L (ref 3.5–5.1)
Sodium: 151 mmol/L — ABNORMAL HIGH (ref 135–145)

## 2021-10-10 LAB — GLUCOSE, CAPILLARY
Glucose-Capillary: 205 mg/dL — ABNORMAL HIGH (ref 70–99)
Glucose-Capillary: 192 mg/dL — ABNORMAL HIGH (ref 70–99)
Glucose-Capillary: 176 mg/dL — ABNORMAL HIGH (ref 70–99)
Glucose-Capillary: 181 mg/dL — ABNORMAL HIGH (ref 70–99)
Glucose-Capillary: 190 mg/dL — ABNORMAL HIGH (ref 70–99)
Glucose-Capillary: 167 mg/dL — ABNORMAL HIGH (ref 70–99)

## 2021-10-10 LAB — MAGNESIUM: Magnesium: 2.1 mg/dL (ref 1.7–2.4)

## 2021-10-10 LAB — PHOSPHORUS: Phosphorus: 4.7 mg/dL — ABNORMAL HIGH (ref 2.5–4.6)

## 2021-10-10 LAB — PROCALCITONIN: Procalcitonin: 0.46 ng/mL

## 2021-10-10 LAB — TRIGLYCERIDES: Triglycerides: 289 mg/dL — ABNORMAL HIGH (ref <150)

## 2021-10-10 MED ORDER — FREE WATER
200.0000 mL | Status: DC
  Administered 2021-10-10 – 2021-10-11 (×7): 200 mL

## 2021-10-10 MED ORDER — PROPOFOL 1000 MG/100ML IV EMUL
5.0000 ug/kg/min | INTRAVENOUS | Status: DC
  Administered 2021-10-11: 15.591 ug/kg/min via INTRAVENOUS
  Administered 2021-10-11 – 2021-10-12 (×5): 30 ug/kg/min via INTRAVENOUS
  Administered 2021-10-12 – 2021-10-13 (×6): 40 ug/kg/min via INTRAVENOUS
  Administered 2021-10-14: 15 ug/kg/min via INTRAVENOUS
  Administered 2021-10-14: 25 ug/kg/min via INTRAVENOUS
  Filled 2021-10-10 (×7): qty 100
  Filled 2021-10-10: qty 200
  Filled 2021-10-10 (×7): qty 100

## 2021-10-10 MED ORDER — ENOXAPARIN SODIUM 60 MG/0.6ML IJ SOSY
0.5000 mg/kg | PREFILLED_SYRINGE | INTRAMUSCULAR | Status: AC
  Administered 2021-10-10 – 2021-10-11 (×2): 50 mg via SUBCUTANEOUS
  Filled 2021-10-10 (×2): qty 0.6

## 2021-10-10 NOTE — Progress Notes (Signed)
PHARMACIST - PHYSICIAN COMMUNICATION  CONCERNING:  Enoxaparin (Lovenox) for DVT Prophylaxis    RECOMMENDATION: Patient was prescribed enoxaprin '40mg'$  q24 hours for VTE prophylaxis.   Filed Weights   10/08/21 0600 10/10/21 0500 10/10/21 1120  Weight: 104.1 kg (229 lb 8 oz) 106.9 kg (235 lb 10.8 oz) 101.9 kg (224 lb 10.4 oz)    Body mass index is 31.33 kg/m.  Estimated Creatinine Clearance: 45.3 mL/min (A) (by C-G formula based on SCr of 1.79 mg/dL (H)).   Based on [place] patient is candidate for enoxaparin 0.'5mg'$ /kg TBW SQ every 24 hours based on BMI being >30.   DESCRIPTION: Pharmacy has adjusted enoxaparin dose per [facility] policy.  Patient is now receiving enoxaparin 0.5 mg/kg every 24 hours    Dallie Piles, PharmD Clinical Pharmacist  10/10/2021 12:55 PM

## 2021-10-10 NOTE — Progress Notes (Signed)
[facility] Hospital Day(s): 10.   Post op day(s): 10 Days Post-Op.   Interval History:  Patient seen and examined Remains off vasopressor support No issues overnight No longer with fevers in last 24 hours  Leukocytosis now resolved; 9.6K Renal function worsening; sCr - 1.79; UO - 3010 ccs Hypernatremia to 151; Hyperphosphatemia to 4.7 Hypertriglyceridemia improved; 289 On tube feeds; 60 ml/hr Also on TPN; 1/2 rate  He does have BM recorded   Vital signs in last 24 hours: [min-max] current  Temp:  [95.5 F (35.3 C)-100 F (37.8 C)] 95.5 F (35.3 C) (08/22 0600) Pulse Rate:  [64-89] 83 (08/22 0600) Resp:  [12-16] 13 (08/22 0600) BP: (105-141)/(54-74) 141/71 (08/22 0600) SpO2:  [97 %-100 %] 100 % (08/22 0600) FiO2 (%):  [35 %] 35 % (08/22 0316) Weight:  [106.9 kg] 106.9 kg (08/22 0500)     Height: '5\' 11"'$  (180.3 cm) Weight: 106.9 kg BMI (Calculated): 32.88   Intake/Output last 2 shifts:  08/21 0701 - 08/22 0700 In: 2824.7 [I.V.:1097; NW/GN:5621.3; IV Piggyback:131] Out: 3010 [Urine:3010]   Physical Exam:  Constitutional: Sedated, intubated  HEENT: NGT in place; on tube feedings  Respiratory: On ventilator  Cardiovascular: regular rate and sinus rhythm  Gastrointestinal: Soft, distension improving. Unable to assess tenderness  Genitourinary: Condom catheter in place; clear urine.  Integumentary: Laparotomy is CDI with staples and honeycomb; no erythema, no drainage  Labs:     Latest Ref Rng & Units 10/10/2021    4:05 AM 10/09/2021    5:10 AM 10/08/2021    5:25 AM  CBC  WBC 4.0 - 10.5 K/uL 9.6  11.3  8.5   Hemoglobin 13.0 - 17.0 g/dL 9.1  10.2  9.9   Hematocrit 39.0 - 52.0 % 31.0  34.3  32.2   Platelets 150 - 400 K/uL 175  207  180       Latest Ref Rng & Units 10/10/2021    4:05 AM 10/09/2021    5:10 AM 10/08/2021    5:25 AM  CMP  Glucose 70 - 99 mg/dL 244  253  230   BUN 8 - 23 mg/dL 84  80  73   Creatinine 0.61 - 1.24  mg/dL 1.79  1.51  1.41   Sodium 135 - 145 mmol/L 151  150  149   Potassium 3.5 - 5.1 mmol/L 5.1  5.3  5.3   Chloride 98 - 111 mmol/L 126  124  123   CO2 22 - 32 mmol/L '22  21  22   '$ Calcium 8.9 - 10.3 mg/dL 9.5  9.7  9.7   Total Protein 6.5 - 8.1 g/dL  6.9    Total Bilirubin 0.3 - 1.2 mg/dL  0.3    Alkaline Phos 38 - 126 U/L  64    AST 15 - 41 U/L  31    ALT 0 - 44 U/L  35       Imaging studies: No new pertinent imaging studies   Assessment/Plan:  71 y.o. male with resolved post-operative ileus, requiring ventilator support, 10 Days Post-Op s/p exploratory laparotomy and LOA for CT findings concerning for SBO, pneumatosis, and pneumoperitoneum   - Continue enteric feedings; advance to goal; having bowel function   - Once tolerating goal rate enteric feedings, I think we can stop TPN  - condom catheter per primary service; good UO; monitor renal function  - Monitor abdominal examination; on-going bowel function   - Can consider serial KUBs  prn  - Monitor renal function  - Monitor leukocytosis; now resolved; afebrile   - OOB once feasible  - Further management per primary service; we will of course follow allong  All of the above findings and recommendations were discussed with the medical team, family present at bedside.   -- Edison Simon, PA-C [facility] 10/10/2021, 7:25 AM M-F: 7am - 4pm

## 2021-10-10 NOTE — Plan of Care (Signed)
Pt was weaned off all IV continuous sedation and pain medication this morning. PRN fentanyl used x1 and pt have Q/ 6hrs scheduled oxy and valium and that seem to be keeping him calm and tolerating vent well. During wake up assessment when all sedation was turned , pt didn't follow any command, will open his eyes and withdraws on all four extremities to deep pain stimulations.  None purposeful movent on to bilat arm.  Pt tolerated Pressure support on the vent for few hours before increase work of breathing started which was relieved with PRN fentanyl.  Midline abdominal incision intact and edges well approximated. and No s/s of infection noted at this time. Pt has two xxlg Bowel movement this evening.  + BS on all 4q of the abd.  TF is at goal.   Dr Mortimer Fries updated pt mother, brother and daughter at bedside today.

## 2021-10-10 NOTE — Inpatient Diabetes Management (Signed)
Inpatient Diabetes Program Recommendations  AACE/ADA: New Consensus Statement on Inpatient Glycemic Control (2015)  Target Ranges:  Prepandial:   less than 140 mg/dL      Peak postprandial:   less than 180 mg/dL (1-2 hours)      Critically ill patients:  140 - 180 mg/dL   Lab Results  Component Value Date   GLUCAP 176 (H) 10/10/2021   HGBA1C 7.1 (H) 06/05/2021    Review of Glycemic Control  Latest Reference Range & Units 10/10/21 03:41 10/10/21 07:31 10/10/21 11:05  Glucose-Capillary 70 - 99 mg/dL 205 (H) 192 (H) 176 (H)  (H): Data is abnormally high Diabetes history: Type 2 DM Outpatient Diabetes medications: Lantus 100 units QHS, Metformin 1500 mg QPM, Novolin R 50 units TID Current orders for Inpatient glycemic control: Novolog 0-20 units Q4H, Novolog 12 units Q4H, Semglee 20 units BID  Inpatient Diabetes Program Recommendations:    Consider increasing tube feed coverage to Novolog 15 units Q4H (to be stopped or held in the event tube feeds are stopped).   Thanks, Bronson Curb, MSN, RNC-OB Diabetes Coordinator [phone] (8a-5p)

## 2021-10-10 NOTE — Progress Notes (Signed)
NAME:  Todd Spencer, MRN:  [id number], DOB:  November 22, 1950, LOS: 10 ADMISSION DATE:  09/25/2021, CONSULTATION DATE: 09/27/2021  History of Present Illness/SYNOPSIS  71 year old male with a history of CAD, ischemic cardiomyopathy, type 2 diabetes, HTN, CKD, and OSA who presented to the ED with abdominal pain, nausea and vomiting. CT scan of the abdomen was performed showing portal venous and mesenteric gas, highly concerning for mesenteric ischemia.  He was started on antibiotics and taken emergently to the OR by Dr. Claudine Mouton for an exploratory laparotomy. History was mostly obtained by chart review as the patient was transferred to the ICU intubated following his procedure.   Dr. Claudine Mouton noted multiple adhesions in the abdomen which were excised and lysed. A mild non-compromised transition area was noted, with the small bowel appearing healthy and well perfused, with no signs of discoloration pneumatosis or perforation. Similarly, the colon was healthy appearing. Following the procedure, he was transferred to the ICU for gradual extubation and further care.  Pertinent  Medical History  CAD s/p stenting to his RCA and LAD HFrEF, ischemic cardiomyopathy HTN HLD T2DM OSA on CPAP  Significant Hospital Events: Including procedures, antibiotic start and stop dates in addition to other pertinent events   10/07/2021: Exploratory laparotomy with lysis of adhesions, no       mesenteric ischemia noted 10/08/2021: Overnight pt with 450 ml of bloody OGT concerning       for possible GIB and worsening hyperkalemia  09/25/2021: Pt extubated to Bipap  10/01/2021: CT Chest/Abd/Pelvis revealed Resolution of previously noted pneumatosis and portal venous gas portal venous gas. There are some persistent areas of small-bowel dilatation, with multiple ill-defined loops of small bowel and multiple air-fluid levels. This may simply represent a mild postoperative ileus, although persistent partial small bowel  obstruction is not entirely excluded. Trace volume of ascites. Dependent opacities in the lung bases bilaterally favored to predominantly reflect areas of subsegmental atelectasis and/or chronic scarring, although a small amount of airspace consolidation is not excluded in the posterior aspect of the right lower lobe. Aortic atherosclerosis, in addition to left main and three-vessel coronary artery disease. Assessment for potential risk factor modification, dietary therapy or pharmacologic therapy may be warranted, if clinically indicated. Additional incidental findings, as above. 10/02/2021: Pt developed worsening delirium/agitation overnight requiring precedex gtt.  Pt lethargic this am but able to follow commands with no signs of agitation.  Will attempt to wean off Bipap to HFNC once mentation improves.  Minimal NGT output overnight  10/03/2021- Trickle feeds initiated on 08/14 @10  ml/hr.  However, overnight he pulled the NGT out.  This am he is having worsening abdominal distension and pain. KUB revealed ileus pattern with slightly progressing bowel gas pattern.  Orders placed to reinsert NGT once placement confirmed will place to LIS.  Intubated to allow time for ileus to resolve (see discussion 10/03/21 note) 8/18 remains on vent, ABD distended, failure to wean from vent 8/19 remains on vent, ABD distended, tolerated TF's 8/20 DID NOT TOLERATE TUBE FEEDS 8/21 severe hypoxia, possible mucus plugs 8/22 tolerating feeds family to come to bedside today  Interim History / Subjective:  Critically ill On MV support Tolerating TF's Failure to wean from vent SAT pending today Renal unction worsening +hyperkalemia     Latest Ref Rng & Units 10/10/2021    4:05 AM 10/09/2021    5:10 AM 10/08/2021    5:25 AM  BMP  Glucose 70 - 99 mg/dL 10/10/2021  719  733  BUN 8 - 23 mg/dL 84  80  73   Creatinine 0.61 - 1.24 mg/dL 1.79  1.51  1.41   Sodium 135 - 145 mmol/L 151  150  149   Potassium 3.5 - 5.1 mmol/L 5.1   5.3  5.3   Chloride 98 - 111 mmol/L 126  124  123   CO2 22 - 32 mmol/L $RemoveB'22  21  22   'UlYYEeTn$ Calcium 8.9 - 10.3 mg/dL 9.5  9.7  9.7         Objective   Blood pressure (!) 141/71, pulse 83, temperature (!) 95.5 F (35.3 C), resp. rate 13, height $RemoveBe'5\' 11"'[id number]$  (1.803 m), weight 106.9 kg, SpO2 100 %.    Vent Mode: PRVC FiO2 (%):  [35 %] 35 % Set Rate:  [14 bmp] 14 bmp Vt Set:  [550 mL] 550 mL PEEP:  [5 cmH20] 5 cmH20 Plateau Pressure:  [13 cmH20-17 cmH20] 13 cmH20   Intake/Output Summary (Last 24 hours) at 10/10/2021 0740 Last data filed at 10/10/2021 0600 Gross per 24 hour  Intake 2824.67 ml  Output 3010 ml  Net -185.33 ml    Filed Weights   10/07/21 0600 10/08/21 0600 10/10/21 0500  Weight: 105.1 kg 104.1 kg 106.9 kg      REVIEW OF SYSTEMS  PATIENT IS UNABLE TO PROVIDE COMPLETE REVIEW OF SYSTEMS DUE TO SEVERE CRITICAL ILLNESS    PHYSICAL EXAMINATION:  GENERAL:critically ill appearing, +resp distress EYES: Pupils equal, round, reactive to light.  No scleral icterus.  MOUTH: Moist mucosal membrane. INTUBATED NECK: Supple.  PULMONARY: +rhonchi,  CARDIOVASCULAR: S1 and S2.  No murmurs  GASTROINTESTINAL: aparotomy is CDI with staples and honeycomb; no erythema, no drainage MUSCULOSKELETAL: +edema.  NEUROLOGIC: obtunded SKIN:intact,warm,dry     Assessment & Plan:   71 yo obese WM with  s/p exploratory laparotomy and LOA for CT findings concerning for SBO, pneumatosis, and pneumoperitoneum anticipated post-operative ileus 5 Days Post-Op associated with severe hypoxic/hypercapnic resp failure and failure to wean from vent   Severe ACUTE Hypoxic and Hypercapnic Respiratory Failure -continue Mechanical Ventilator support -Wean Fio2 and PEEP as tolerated -VAP/VENT bundle implementation - Wean PEEP & FiO2 as tolerated, maintain SpO2 > 88% - Head of bed elevated 30 degrees, VAP protocol in place - Plateau pressures less than 30 cm H20  - Intermittent chest x-ray & ABG PRN -  Ensure adequate pulmonary hygiene  -will perform SAT/SBT when respiratory parameters are met Postoperative hypoxemic respiratory failure- due risk of aspiration (see discussion 10/03/21), decision made to keep on mechanical ventilation until return of bowel function-TOLERATING TF's last 24 hrs will plan for SAT/SBT WHEN FAMILY ARRIVES    ABDOMEN SBO with small bowel ischemia- s/p ex lap LOA 8/12 Postoperative ileus- follow up GEN surg RECS TF's at 50, will need to get to goal, ABD  less  distended   NEUROLOGY ACUTE METABOLIC ENCEPHALOPATHY WUA pending  ACUTE KIDNEY INJURY/Renal Failure -continue Foley Catheter-assess need -Avoid nephrotoxic agents -Follow urine output, BMP -Ensure adequate renal perfusion, optimize oxygenation -Renal dose medications   Intake/Output Summary (Last 24 hours) at 10/10/2021 0744 Last data filed at 10/10/2021 0600 Gross per 24 hour  Intake 2824.67 ml  Output 3010 ml  Net -185.33 ml      Latest Ref Rng & Units 10/10/2021    4:05 AM 10/09/2021    5:10 AM 10/08/2021    5:25 AM  BMP  Glucose 70 - 99 mg/dL 244  253  230   BUN 8 - 23  mg/dL 84  80  73   Creatinine 0.61 - 1.24 mg/dL 1.79  1.51  1.41   Sodium 135 - 145 mmol/L 151  150  149   Potassium 3.5 - 5.1 mmol/L 5.1  5.3  5.3   Chloride 98 - 111 mmol/L 126  124  123   CO2 22 - 32 mmol/L $RemoveB'22  21  22   'qAXfiBjJ$ Calcium 8.9 - 10.3 mg/dL 9.5  9.7  9.7     ACUTE ANEMIA- TRANSFUSE AS NEEDED CONSIDER TRANSFUSION  IF HGB<7   ENDO - ICU hypoglycemic\Hyperglycemia protocol -check FSBS per protocol   GI GI PROPHYLAXIS as indicated  NUTRITIONAL STATUS DIET-->TF's as tolerated Constipation protocol as indicated   ELECTROLYTES -follow labs as needed -replace as needed -pharmacy consultation and following    Best Practice (right click and "Reselect all SmartList Selections" daily)   Diet/type: TPN DVT prophylaxis: prophylactic heparin  GI prophylaxis: PPI Lines: PICC Foley:  Yes, and it is  still needed: keep for a few more days     DVT/GI PRX  assessed I Assessed the need for Labs I Assessed the need for Foley I Assessed the need for Central Venous Line Family Discussion when available I Assessed the need for Mobilization I made an Assessment of medications to be adjusted accordingly Safety Risk assessment completed  CASE DISCUSSED IN MULTIDISCIPLINARY ROUNDS WITH ICU TEAM     Critical Care Time devoted to patient care services described in this note is 45  minutes.  Critical care was necessary to treat /prevent imminent and life-threatening deterioration. Overall, patient is critically ill, prognosis is guarded.  Patient with Multiorgan failure and at high risk for cardiac arrest and death.    Corrin Parker, M.D.  [facility]  Medical Director [place] Director [facility] Cardio-Pulmonary Department

## 2021-10-11 ENCOUNTER — Inpatient Hospital Stay: Payer: Medicare HMO

## 2021-10-11 DIAGNOSIS — K55059 Acute (reversible) ischemia of intestine, part and extent unspecified: Secondary | ICD-10-CM | POA: Diagnosis not present

## 2021-10-11 DIAGNOSIS — R109 Unspecified abdominal pain: Secondary | ICD-10-CM | POA: Diagnosis not present

## 2021-10-11 DIAGNOSIS — K559 Vascular disorder of intestine, unspecified: Secondary | ICD-10-CM | POA: Diagnosis not present

## 2021-10-11 DIAGNOSIS — J9601 Acute respiratory failure with hypoxia: Secondary | ICD-10-CM | POA: Diagnosis not present

## 2021-10-11 LAB — CBC WITH DIFFERENTIAL/PLATELET
Abs Immature Granulocytes: 0.17 10*3/uL — ABNORMAL HIGH (ref 0.00–0.07)
Basophils Absolute: 0 10*3/uL (ref 0.0–0.1)
Basophils Relative: 0 %
Eosinophils Absolute: 0.1 10*3/uL (ref 0.0–0.5)
Eosinophils Relative: 1 %
HCT: 33.7 % — ABNORMAL LOW (ref 39.0–52.0)
Hemoglobin: 10.2 g/dL — ABNORMAL LOW (ref 13.0–17.0)
Immature Granulocytes: 2 %
Lymphocytes Relative: 6 %
Lymphs Abs: 0.6 10*3/uL — ABNORMAL LOW (ref 0.7–4.0)
MCH: 29.4 pg (ref 26.0–34.0)
MCHC: 30.3 g/dL (ref 30.0–36.0)
MCV: 97.1 fL (ref 80.0–100.0)
Monocytes Absolute: 0.8 10*3/uL (ref 0.1–1.0)
Monocytes Relative: 7 %
Neutro Abs: 9 10*3/uL — ABNORMAL HIGH (ref 1.7–7.7)
Neutrophils Relative %: 84 %
Platelets: 223 10*3/uL (ref 150–400)
RBC: 3.47 MIL/uL — ABNORMAL LOW (ref 4.22–5.81)
RDW: 16 % — ABNORMAL HIGH (ref 11.5–15.5)
WBC: 10.6 10*3/uL — ABNORMAL HIGH (ref 4.0–10.5)
nRBC: 0 % (ref 0.0–0.2)

## 2021-10-11 LAB — BASIC METABOLIC PANEL
Anion gap: 3 — ABNORMAL LOW (ref 5–15)
BUN: 69 mg/dL — ABNORMAL HIGH (ref 8–23)
CO2: 23 mmol/L (ref 22–32)
Calcium: 9.4 mg/dL (ref 8.9–10.3)
Chloride: 127 mmol/L — ABNORMAL HIGH (ref 98–111)
Creatinine, Ser: 1.38 mg/dL — ABNORMAL HIGH (ref 0.61–1.24)
GFR, Estimated: 54 mL/min — ABNORMAL LOW (ref >60)
Glucose, Bld: 238 mg/dL — ABNORMAL HIGH (ref 70–99)
Potassium: 4.3 mmol/L (ref 3.5–5.1)
Sodium: 153 mmol/L — ABNORMAL HIGH (ref 135–145)

## 2021-10-11 LAB — MAGNESIUM: Magnesium: 1.8 mg/dL (ref 1.7–2.4)

## 2021-10-11 LAB — GLUCOSE, CAPILLARY
Glucose-Capillary: 232 mg/dL — ABNORMAL HIGH (ref 70–99)
Glucose-Capillary: 203 mg/dL — ABNORMAL HIGH (ref 70–99)
Glucose-Capillary: 162 mg/dL — ABNORMAL HIGH (ref 70–99)
Glucose-Capillary: 136 mg/dL — ABNORMAL HIGH (ref 70–99)
Glucose-Capillary: 135 mg/dL — ABNORMAL HIGH (ref 70–99)
Glucose-Capillary: 119 mg/dL — ABNORMAL HIGH (ref 70–99)

## 2021-10-11 LAB — PHOSPHORUS: Phosphorus: 3.4 mg/dL (ref 2.5–4.6)

## 2021-10-11 LAB — TRIGLYCERIDES: Triglycerides: 282 mg/dL — ABNORMAL HIGH (ref <150)

## 2021-10-11 MED ORDER — VITAL 1.5 CAL PO LIQD: 1000.0000 mL | ORAL | Status: DC

## 2021-10-11 MED ORDER — MAGNESIUM SULFATE 2 GM/50ML IV SOLN
2.0000 g | Freq: Once | INTRAVENOUS | Status: AC
Start: 2021-10-11 — End: 2021-10-11
  Administered 2021-10-11: 2 g via INTRAVENOUS
  Filled 2021-10-11: qty 50

## 2021-10-11 MED ORDER — DEXTROSE 5 % IV SOLN: INTRAVENOUS | Status: DC

## 2021-10-11 MED ORDER — FREE WATER
30.0000 mL | Status: DC
Start: 2021-10-11 — End: 2021-10-12
  Administered 2021-10-11 – 2021-10-12 (×5): 30 mL
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
## 2021-10-11 NOTE — Progress Notes (Addendum)
NAME:  Todd Spencer, MRN:  [id number], DOB:  1950/07/10, LOS: 55 ADMISSION DATE:  09/29/2021, CONSULTATION DATE: 10/08/2021  History of Present Illness/SYNOPSIS  71 year old male with a history of CAD, ischemic cardiomyopathy, type 2 diabetes, HTN, CKD, and OSA who presented to the ED with abdominal pain, nausea and vomiting. CT scan of the abdomen was performed showing portal venous and mesenteric gas, highly concerning for mesenteric ischemia.  He was started on antibiotics and taken emergently to the OR by Dr. Christian Mate for an exploratory laparotomy. History was mostly obtained by chart review as the patient was transferred to the ICU intubated following his procedure.   Dr. Christian Mate noted multiple adhesions in the abdomen which were excised and lysed. A mild non-compromised transition area was noted, with the small bowel appearing healthy and well perfused, with no signs of discoloration pneumatosis or perforation. Similarly, the colon was healthy appearing. Following the procedure, he was transferred to the ICU for gradual extubation and further care.  Pertinent  Medical History  CAD s/p stenting to his RCA and LAD HFrEF, ischemic cardiomyopathy HTN HLD T2DM OSA on CPAP  Significant Hospital Events: Including procedures, antibiotic start and stop dates in addition to other pertinent events   10/16/2021: Exploratory laparotomy with lysis of adhesions, no       mesenteric ischemia noted 09/22/2021: Overnight pt with 450 ml of bloody OGT concerning       for possible GIB and worsening hyperkalemia  09/23/2021: Pt extubated to [place]  10/01/2021: CT Chest/Abd/Pelvis revealed Resolution of previously noted pneumatosis and portal venous gas portal venous gas. There are some persistent areas of small-bowel dilatation, with multiple ill-defined loops of small bowel and multiple air-fluid levels. This may simply represent a mild postoperative ileus, although persistent partial small bowel  obstruction is not entirely excluded. Trace volume of ascites. Dependent opacities in the lung bases bilaterally favored to predominantly reflect areas of subsegmental atelectasis and/or chronic scarring, although a small amount of airspace consolidation is not excluded in the posterior aspect of the right lower lobe. Aortic atherosclerosis, in addition to left main and three-vessel coronary artery disease. Assessment for potential risk factor modification, dietary therapy or pharmacologic therapy may be warranted, if clinically indicated. Additional incidental findings, as above. 10/02/2021: Pt developed worsening delirium/agitation overnight requiring precedex gtt.  Pt lethargic this am but able to follow commands with no signs of agitation.  Will attempt to wean off Bipap to HFNC once mentation improves.  Minimal NGT output overnight  10/03/2021- Trickle feeds initiated on 08/14 _0  ml/hr.  However, overnight he pulled the NGT out.  This am he is having worsening abdominal distension and pain. KUB revealed ileus pattern with slightly progressing bowel gas pattern.  Orders placed to reinsert NGT once placement confirmed will place to LIS.  Intubated to allow time for ileus to resolve (see discussion 10/03/21 note) 8/18 remains on vent, ABD distended, failure to wean from vent 8/19 remains on vent, ABD distended, tolerated TF's 8/20 DID NOT TOLERATE TUBE FEEDS 8/21 severe hypoxia, possible mucus plugs 8/22 tolerating feeds family to come to bedside today  Interim History / Subjective:  Remains critically ill Failure to wean from vent Tolerating TF;s      Latest Ref Rng & Units 10/11/2021    4:37 AM 10/10/2021    4:05 AM 10/09/2021    5:10 AM  BMP  Glucose 70 - 99 mg/dL 238  244  253   BUN 8 - 23 mg/dL 69  84  80   Creatinine 0.61 - 1.24 mg/dL 1.38  1.79  1.51   Sodium 135 - 145 mmol/L 153  151  150   Potassium 3.5 - 5.1 mmol/L 4.3  5.1  5.3   Chloride 98 - 111 mmol/L 127  126  124   CO2 22 - 32  mmol/L _0 Calcium 8.9 - 10.3 mg/dL 9.4  9.5  9.7         Objective   Blood pressure (!) 154/80, pulse (!) 103, temperature 99.3 F (37.4 C), resp. rate 20, height 5' 11" (1.803 m), weight 100 kg, SpO2 98 %.    Vent Mode: CPAP;PSV FiO2 (%):  [35 %] 35 % Set Rate:  [14 bmp] 14 bmp Vt Set:  [550 mL] 550 mL PEEP:  [5 cmH20] 5 cmH20 Pressure Support:  [5 cmH20] 5 cmH20 Plateau Pressure:  [15 cmH20-21 cmH20] 15 cmH20   Intake/Output Summary (Last 24 hours) at 10/11/2021 0800 Last data filed at 10/11/2021 0700 Gross per 24 hour  Intake 1813.63 ml  Output 4052 ml  Net -2238.37 ml    Filed Weights   10/10/21 0500 10/10/21 1120 10/11/21 0500  Weight: 106.9 kg 101.9 kg 100 kg     REVIEW OF SYSTEMS  PATIENT IS UNABLE TO PROVIDE COMPLETE REVIEW OF SYSTEMS DUE TO SEVERE CRITICAL ILLNESS    PHYSICAL EXAMINATION:  GENERAL:critically ill appearing,  EYES: Pupils equal, round, reactive to light.  No scleral icterus.  MOUTH: Moist mucosal membrane. INTUBATED NECK: Supple.  PULMONARY: +rhonchi, +wheezing CARDIOVASCULAR: S1 and S2.  No murmurs  GASTROINTESTINAL: aparotomy is CDI with staples and honeycomb; no erythema, no drainage MUSCULOSKELETAL: No swelling, clubbing, or edema.  NEUROLOGIC: obtunded SKIN:intact,warm,dry       Assessment & Plan:   71 yo obese WM with  s/p exploratory laparotomy and LOA for CT findings concerning for SBO, pneumatosis, and pneumoperitoneum anticipated post-operative ileus 5 Days Post-Op associated with severe hypoxic/hypercapnic resp failure and failure to wean from vent  Severe ACUTE Hypoxic and Hypercapnic Respiratory Failure -continue Mechanical Ventilator support -Wean Fio2 and PEEP as tolerated -VAP/VENT bundle implementation - Wean PEEP & FiO2 as tolerated, maintain SpO2 > 88% - Head of bed elevated 30 degrees, VAP protocol in place - Plateau pressures less than 30 cm H20  - Intermittent chest x-ray & ABG PRN - Ensure  adequate pulmonary hygiene  -will perform SAT/SBT when respiratory parameters are met Postoperative hypoxemic respiratory failure- due risk of aspiration (see discussion 10/03/21), decision made to keep on mechanical ventilation until return of bowel function-TOLERATING TF's last 24 hrs will plan for SAT/SBT WHEN FAMILY ARRIVES    ABDOMEN SBO with small bowel ischemia- s/p ex lap LOA 8/12 Postoperative ileus- follow up GEN surg RECS TF's at 50, will need to get to goal, ABD  less  distended   NEUROLOGY ACUTE METABOLIC ENCEPHALOPATHY Stop all sedatives, awaiting for family arrival  ACUTE KIDNEY INJURY/Renal Failure -continue Foley Catheter-assess need -Avoid nephrotoxic agents -Follow urine output, BMP -Ensure adequate renal perfusion, optimize oxygenation -Renal dose medications   Intake/Output Summary (Last 24 hours) at 10/11/2021 0802 Last data filed at 10/11/2021 0700 Gross per 24 hour  Intake 1813.63 ml  Output 4052 ml  Net -2238.37 ml       Latest Ref Rng & Units 10/11/2021    4:37 AM 10/10/2021    4:05 AM 10/09/2021    5:10 AM  BMP  Glucose 70 - 99 mg/dL 238  244  253   BUN 8 - 23 mg/dL 69  84  80   Creatinine 0.61 - 1.24 mg/dL 1.38  1.79  1.51   Sodium 135 - 145 mmol/L 153  151  150   Potassium 3.5 - 5.1 mmol/L 4.3  5.1  5.3   Chloride 98 - 111 mmol/L 127  126  124   CO2 22 - 32 mmol/L _0 Calcium 8.9 - 10.3 mg/dL 9.4  9.5  9.7     ACUTE ANEMIA- TRANSFUSE AS NEEDED CONSIDER TRANSFUSION  IF HGB<7   ENDO - ICU hypoglycemic\Hyperglycemia protocol -check FSBS per protocol   GI GI PROPHYLAXIS as indicated  NUTRITIONAL STATUS DIET-->TF's as tolerated Constipation protocol as indicated   ELECTROLYTES -follow labs as needed -replace as needed -pharmacy consultation and following    Best Practice (right click and "Reselect all SmartList Selections" daily)   Diet/type: TPN DVT prophylaxis: prophylactic heparin  GI prophylaxis: PPI Lines:  PICC Foley:  Yes, and it is still needed: keep for a few more days    DVT/GI PRX  assessed I Assessed the need for Labs I Assessed the need for Foley I Assessed the need for Central Venous Line Family Discussion when available I Assessed the need for Mobilization I made an Assessment of medications to be adjusted accordingly Safety Risk assessment completed  CASE DISCUSSED IN MULTIDISCIPLINARY ROUNDS WITH ICU TEAM     Critical Care Time devoted to patient care services described in this note is 55  minutes.   Critical care was necessary to treat /prevent imminent and life-threatening deterioration.    Corrin Parker, M.D.  [facility]  Medical Director [place] Director [facility] Cardio-Pulmonary Department

## 2021-10-11 NOTE — IPAL (Signed)
  Interdisciplinary Goals of Care Family Meeting   Date carried out: 10/11/2021  Location of the meeting: Bedside  Member's involved: Physician and Family Member or next of kin    GOALS OF CARE DISCUSSION  The Clinical status was relayed to family in detail-Mom at bedside  Updated and notified of patients medical condition- Patient remains unresponsive and will not open eyes to command.    Explained to family course of therapy and the modalities   Patient with Progressive multiorgan failure with a very high probablity of a very minimal chance of meaningful recovery despite all aggressive and optimal medical therapy.    Family understands the situation. Plan for [place] today  Family are satisfied with Plan of action and management. All questions answered  Additional CC time 25 mins   Gailene Youkhana Patricia Pesa, M.D.  [facility]  Medical Director [place] Director [facility] Cardio-Pulmonary Department

## 2021-10-11 NOTE — Progress Notes (Signed)
[place] Hospital Day(s): 11.   Post op day(s): 11 Days Post-Op.   Interval History:  Patient seen and examined Unsuccessful weaning from ventilator last night it appears T-max 100.79F at 0000 Variable WBC in last 24 hours; now 10.6K (11.3 --> 9.6K --> 10.6K) Hgb stable at 10.2 Renal function improved this morning; sCr - 1.38; UO - 4425 ccs Hypernatremia to 153 Hypertriglyceridemia improved; 282 On tube feeds; 60 ml/hr He does have multiple BM recorded   Vital signs in last 24 hours: [min-max] current  Temp:  [95.7 F (35.4 C)-100.4 F (38 C)] 99.3 F (37.4 C) (08/23 0700) Pulse Rate:  [73-115] 103 (08/23 0700) Resp:  [12-28] 20 (08/23 0700) BP: (125-168)/(60-88) 154/80 (08/23 0700) SpO2:  [94 %-100 %] 99 % (08/23 0700) FiO2 (%):  [35 %] 35 % (08/23 0400) Weight:  [100 kg-101.9 kg] 100 kg (08/23 0500)     Height: '5\' 11"'$  (180.3 cm) Weight: 100 kg BMI (Calculated): 30.76   Intake/Output last 2 shifts:  08/22 0701 - 08/23 0700 In: 1813.6 [I.V.:43.6; NG/GT:1770] Out: 4427 [Urine:4425; Stool:2]   Physical Exam:  Constitutional: Sedated, intubated  HEENT: NGT in place; on tube feedings  Respiratory: On ventilator  Cardiovascular: regular rate and sinus rhythm  Gastrointestinal: Soft, distension improving. Unable to assess tenderness  Genitourinary: Condom catheter in place; clear urine.  Integumentary: Laparotomy is CDI with staples and honeycomb; no erythema, no drainage  Labs:     Latest Ref Rng & Units 10/11/2021    4:37 AM 10/10/2021    4:05 AM 10/09/2021    5:10 AM  CBC  WBC 4.0 - 10.5 K/uL 10.6  9.6  11.3   Hemoglobin 13.0 - 17.0 g/dL 10.2  9.1  10.2   Hematocrit 39.0 - 52.0 % 33.7  31.0  34.3   Platelets 150 - 400 K/uL 223  175  207       Latest Ref Rng & Units 10/11/2021    4:37 AM 10/10/2021    4:05 AM 10/09/2021    5:10 AM  CMP  Glucose 70 - 99 mg/dL 238  244  253   BUN 8 - 23 mg/dL 69  84  80   Creatinine 0.61  - 1.24 mg/dL 1.38  1.79  1.51   Sodium 135 - 145 mmol/L 153  151  150   Potassium 3.5 - 5.1 mmol/L 4.3  5.1  5.3   Chloride 98 - 111 mmol/L 127  126  124   CO2 22 - 32 mmol/L '23  22  21   '$ Calcium 8.9 - 10.3 mg/dL 9.4  9.5  9.7   Total Protein 6.5 - 8.1 g/dL   6.9   Total Bilirubin 0.3 - 1.2 mg/dL   0.3   Alkaline Phos 38 - 126 U/L   64   AST 15 - 41 U/L   31   ALT 0 - 44 U/L   35      Imaging studies: No new pertinent imaging studies   Assessment/Plan:  71 y.o. male with resolved post-operative ileus, requiring ventilator support, 11 Days Post-Op s/p exploratory laparotomy and LOA for CT findings concerning for SBO, pneumatosis, and pneumoperitoneum   - Continue enteric feedings; at goal; having bowel function  - Monitor abdominal examination; on-going bowel function   - Monitor renal function; improving  - Monitor leukocytosis; fluctuating; low grade fever last night   - OOB once feasible  - [place] can typically come out between POD  10-14; I will leave until next week as precaution  - Further management per primary service; we will follow peripherally/periodically at this point we are not adding much.   All of the above findings and recommendations were discussed with the medical team, family present at bedside.   -- Edison Simon, PA-C [facility] 10/11/2021, 7:24 AM M-F: 7am - 4pm

## 2021-10-11 NOTE — Progress Notes (Signed)
Received report from [place] at 1500. Per Imma, this RN and MD Kasa conversation at nursing desk, the plan is to hold off on tube feedings and leave OG connected to LIS until tomorrow. This information will be passed along to night shift and I will chart against in [facility].
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
## 2021-10-12 DIAGNOSIS — R109 Unspecified abdominal pain: Secondary | ICD-10-CM | POA: Diagnosis not present

## 2021-10-12 DIAGNOSIS — K55059 Acute (reversible) ischemia of intestine, part and extent unspecified: Secondary | ICD-10-CM | POA: Diagnosis not present

## 2021-10-12 DIAGNOSIS — K559 Vascular disorder of intestine, unspecified: Secondary | ICD-10-CM | POA: Diagnosis not present

## 2021-10-12 DIAGNOSIS — J9601 Acute respiratory failure with hypoxia: Secondary | ICD-10-CM | POA: Diagnosis not present

## 2021-10-12 LAB — CBC WITH DIFFERENTIAL/PLATELET
Abs Immature Granulocytes: 0.08 10*3/uL — ABNORMAL HIGH (ref 0.00–0.07)
Basophils Absolute: 0 10*3/uL (ref 0.0–0.1)
Basophils Relative: 0 %
Eosinophils Absolute: 0.1 10*3/uL (ref 0.0–0.5)
Eosinophils Relative: 1 %
HCT: 33.5 % — ABNORMAL LOW (ref 39.0–52.0)
Hemoglobin: 9.9 g/dL — ABNORMAL LOW (ref 13.0–17.0)
Immature Granulocytes: 1 %
Lymphocytes Relative: 9 %
Lymphs Abs: 0.7 10*3/uL (ref 0.7–4.0)
MCH: 28.9 pg (ref 26.0–34.0)
MCHC: 29.6 g/dL — ABNORMAL LOW (ref 30.0–36.0)
MCV: 98 fL (ref 80.0–100.0)
Monocytes Absolute: 0.6 10*3/uL (ref 0.1–1.0)
Monocytes Relative: 7 %
Neutro Abs: 6.8 10*3/uL (ref 1.7–7.7)
Neutrophils Relative %: 82 %
Platelets: 213 10*3/uL (ref 150–400)
RBC: 3.42 MIL/uL — ABNORMAL LOW (ref 4.22–5.81)
RDW: 16.1 % — ABNORMAL HIGH (ref 11.5–15.5)
WBC: 8.3 10*3/uL (ref 4.0–10.5)
nRBC: 0 % (ref 0.0–0.2)

## 2021-10-12 LAB — MAGNESIUM: Magnesium: 2.3 mg/dL (ref 1.7–2.4)

## 2021-10-12 LAB — GLUCOSE, CAPILLARY
Glucose-Capillary: 167 mg/dL — ABNORMAL HIGH (ref 70–99)
Glucose-Capillary: 158 mg/dL — ABNORMAL HIGH (ref 70–99)
Glucose-Capillary: 142 mg/dL — ABNORMAL HIGH (ref 70–99)
Glucose-Capillary: 180 mg/dL — ABNORMAL HIGH (ref 70–99)
Glucose-Capillary: 223 mg/dL — ABNORMAL HIGH (ref 70–99)
Glucose-Capillary: 251 mg/dL — ABNORMAL HIGH (ref 70–99)

## 2021-10-12 LAB — BASIC METABOLIC PANEL
Anion gap: 3 — ABNORMAL LOW (ref 5–15)
BUN: 62 mg/dL — ABNORMAL HIGH (ref 8–23)
CO2: 25 mmol/L (ref 22–32)
Calcium: 9.5 mg/dL (ref 8.9–10.3)
Chloride: 126 mmol/L — ABNORMAL HIGH (ref 98–111)
Creatinine, Ser: 1.51 mg/dL — ABNORMAL HIGH (ref 0.61–1.24)
GFR, Estimated: 49 mL/min — ABNORMAL LOW (ref >60)
Glucose, Bld: 179 mg/dL — ABNORMAL HIGH (ref 70–99)
Potassium: 4.1 mmol/L (ref 3.5–5.1)
Sodium: 154 mmol/L — ABNORMAL HIGH (ref 135–145)

## 2021-10-12 LAB — PHOSPHORUS: Phosphorus: 4 mg/dL (ref 2.5–4.6)

## 2021-10-12 LAB — TRIGLYCERIDES: Triglycerides: 289 mg/dL — ABNORMAL HIGH (ref <150)

## 2021-10-12 MED ORDER — FREE WATER
45.0000 mL | Status: DC
  Administered 2021-10-12: 45 mL

## 2021-10-12 MED ORDER — METOCLOPRAMIDE HCL 5 MG/ML IJ SOLN
5.0000 mg | Freq: Three times a day (TID) | INTRAMUSCULAR | Status: DC
  Administered 2021-10-12 – 2021-10-16 (×12): 5 mg via INTRAVENOUS
  Filled 2021-10-12 (×12): qty 2

## 2021-10-12 MED ORDER — FREE WATER
30.0000 mL | Status: DC
Start: 2021-10-12 — End: 2021-10-16
  Administered 2021-10-12 – 2021-10-16 (×14): 30 mL

## 2021-10-12 MED ORDER — TRACE MINERALS CU-MN-SE-ZN 300-55-60-3000 MCG/ML IV SOLN
INTRAVENOUS | Status: AC
  Filled 2021-10-12: qty 498.4

## 2021-10-12 MED ORDER — VITAL 1.5 CAL PO LIQD
1000.0000 mL | ORAL | Status: DC
  Administered 2021-10-12: 1000 mL

## 2021-10-12 NOTE — Progress Notes (Addendum)
[facility] Day(s): 12.   Post op day(s): 12 Days Post-Op.   Interval History:  Patient seen and examined No further emesis; tube feedings being held No fevers WBC again normalized; 8.3K Hgb stable at 9.9 Renal function remains variable; sCr - 1.51; UO - 4425 ccs Hypernatremia to 154 Hypertriglyceridemia stable; 289 He does have multiple BM recorded  Plan for tracheostomy Friday; 08/25  Vital signs in last 24 hours: [min-max] current  Temp:  [97.3 F (36.3 C)-100 F (37.8 C)] 100 F (37.8 C) (08/24 0400) Pulse Rate:  [88-117] 95 (08/24 0600) Resp:  [18-27] 23 (08/24 0600) BP: (110-169)/(64-92) 138/78 (08/24 0600) SpO2:  [88 %-99 %] 95 % (08/24 0600) FiO2 (%):  [35 %] 35 % (08/24 0400) Weight:  [100.8 kg] 100.8 kg (08/24 0310)     Height: '5\' 11"'$  (180.3 cm) Weight: 100.8 kg BMI (Calculated): 31.01   Intake/Output last 2 shifts:  08/23 0701 - 08/24 0700 In: 1940.5 [I.V.:1559.5; NG/GT:331; IV Piggyback:50] Out: 4025 [Urine:2325; Emesis/NG output:1700]   Physical Exam:  Constitutional: Sedated, intubated  HEENT: NGT in place Respiratory: On ventilator  Cardiovascular: regular rate and sinus rhythm  Gastrointestinal: Soft, distension improving. Unable to assess tenderness  Genitourinary: Condom catheter in place; clear urine.  Integumentary: Laparotomy is CDI with staples and honeycomb; no erythema, no drainage  Labs:     Latest Ref Rng & Units 10/12/2021    4:12 AM 10/11/2021    4:37 AM 10/10/2021    4:05 AM  CBC  WBC 4.0 - 10.5 K/uL 8.3  10.6  9.6   Hemoglobin 13.0 - 17.0 g/dL 9.9  10.2  9.1   Hematocrit 39.0 - 52.0 % 33.5  33.7  31.0   Platelets 150 - 400 K/uL 213  223  175       Latest Ref Rng & Units 10/12/2021    4:12 AM 10/11/2021    4:37 AM 10/10/2021    4:05 AM  CMP  Glucose 70 - 99 mg/dL 179  238  244   BUN 8 - 23 mg/dL 62  69  84   Creatinine 0.61 - 1.24 mg/dL 1.51  1.38  1.79   Sodium 135 - 145 mmol/L  154  153  151   Potassium 3.5 - 5.1 mmol/L 4.1  4.3  5.1   Chloride 98 - 111 mmol/L 126  127  126   CO2 22 - 32 mmol/L '25  23  22   '$ Calcium 8.9 - 10.3 mg/dL 9.5  9.4  9.5      Imaging studies: No new pertinent imaging studies   Assessment/Plan:  71 y.o. male with resolved post-operative ileus, requiring ventilator support, 12 Days Post-Op s/p exploratory laparotomy and LOA for CT findings concerning for SBO, pneumatosis, and pneumoperitoneum   - Okay to trial trickle feedings today if needed; Hold at midnight for anticipation of tracheostomy - Monitor abdominal examination; on-going bowel function   - Monitor renal function; fluctuating but stable  - Monitor leukocytosis; normalized  - OOB once feasible  - Staples can typically come out between POD 10-14; I will leave until next week as precaution  - Further management per primary service; we will follow peripherally/periodically at this point, decision on potential PEG pending clinical progress  All of the above findings and recommendations were discussed with the medical team, family present at bedside.   -- Edison Simon, PA-C [facility] 10/12/2021, 7:55 AM M-F: 7am - 4pm

## 2021-10-12 NOTE — TOC Progression Note (Signed)
Transition of Care [facility]) - Progression Note    Patient Details  Name: Todd Spencer MRN: [id number] Date of Birth: 02/07/1951  [facility]) CM/SW Contact  Shelbie Hutching, RN Phone Number: 10/12/2021, 1:24 PM  Clinical Narrative:    Patient remains intubated and sedated, ENT consulted for Trach.  Patient may be a good LTAC candidate once he gets the trach.    Expected Discharge Plan:  (TBD) Barriers to Discharge: Continued Medical Work up  Expected Discharge Plan and Services Expected Discharge Plan:  (TBD)   Discharge Planning Services: CM Consult   Living arrangements for the past 2 months: Single Family Home                                       Social Determinants of Health (SDOH) Interventions    Readmission Risk Interventions     No data to display

## 2021-10-12 NOTE — Progress Notes (Signed)
NAME:  Todd Spencer, MRN:  [id number], DOB:  [birth year], LOS: 30 ADMISSION DATE:  10/11/2021, CONSULTATION DATE: 09/20/2021  History of Present Illness/SYNOPSIS  71 year old male with a history of CAD, ischemic cardiomyopathy, type 2 diabetes, HTN, CKD, and OSA who presented to the ED with abdominal pain, nausea and vomiting. CT scan of the abdomen was performed showing portal venous and mesenteric gas, highly concerning for mesenteric ischemia.  He was started on antibiotics and taken emergently to the OR by Dr. Christian Mate for an exploratory laparotomy. History was mostly obtained by chart review as the patient was transferred to the ICU intubated following his procedure.   Dr. Christian Mate noted multiple adhesions in the abdomen which were excised and lysed. A mild non-compromised transition area was noted, with the small bowel appearing healthy and well perfused, with no signs of discoloration pneumatosis or perforation. Similarly, the colon was healthy appearing. Following the procedure, he was transferred to the ICU for gradual extubation and further care.  Pertinent  Medical History  CAD s/p stenting to his RCA and LAD HFrEF, ischemic cardiomyopathy HTN HLD T2DM OSA on CPAP  Significant Hospital Events: Including procedures, antibiotic start and stop dates in addition to other pertinent events   10/03/2021: Exploratory laparotomy with lysis of adhesions, no       mesenteric ischemia noted 09/24/2021: Overnight pt with 450 ml of bloody OGT concerning       for possible GIB and worsening hyperkalemia  10/12/2021: Pt extubated to [place]  10/01/2021: CT Chest/Abd/Pelvis revealed Resolution of previously noted pneumatosis and portal venous gas portal venous gas. There are some persistent areas of small-bowel dilatation, with multiple ill-defined loops of small bowel and multiple air-fluid levels. This may simply represent a mild postoperative ileus, although persistent partial small bowel  obstruction is not entirely excluded. Trace volume of ascites. Dependent opacities in the lung bases bilaterally favored to predominantly reflect areas of subsegmental atelectasis and/or chronic scarring, although a small amount of airspace consolidation is not excluded in the posterior aspect of the right lower lobe. Aortic atherosclerosis, in addition to left main and three-vessel coronary artery disease. Assessment for potential risk factor modification, dietary therapy or pharmacologic therapy may be warranted, if clinically indicated. Additional incidental findings, as above. 10/02/2021: Pt developed worsening delirium/agitation overnight requiring precedex gtt.  Pt lethargic this am but able to follow commands with no signs of agitation.  Will attempt to wean off Bipap to HFNC once mentation improves.  Minimal NGT output overnight  10/03/2021- Trickle feeds initiated on 08/14 '@10'$  ml/hr.  However, overnight he pulled the NGT out.  This am he is having worsening abdominal distension and pain. KUB revealed ileus pattern with slightly progressing bowel gas pattern.  Orders placed to reinsert NGT once placement confirmed will place to LIS.  Intubated to allow time for ileus to resolve (see discussion 10/03/21 note) 8/18 remains on vent, ABD distended, failure to wean from vent 8/19 remains on vent, ABD distended, tolerated TF's 8/20 DID NOT TOLERATE TUBE FEEDS 8/21 severe hypoxia, possible mucus plugs 8/22 tolerating feeds family to come to bedside today 8/23 failure to wean from vent, ENT consulted for [facility]  Interim History / Subjective:  Remains critically ill +ileus did NOT tolerate TFs  Prognosis is guarded Failure to wean from vent ENT consulted for [facility]      Latest Ref Rng & Units 10/12/2021    4:12 AM 10/11/2021    4:37 AM 10/10/2021    4:05 AM  BMP  Glucose 70 - 99 mg/dL 179  238  244   BUN 8 - 23 mg/dL 62  69  84   Creatinine 0.61 - 1.24 mg/dL 1.51  1.38  1.79   Sodium 135 - 145  mmol/L 154  153  151   Potassium 3.5 - 5.1 mmol/L 4.1  4.3  5.1   Chloride 98 - 111 mmol/L 126  127  126   CO2 22 - 32 mmol/L '25  23  22   '$ Calcium 8.9 - 10.3 mg/dL 9.5  9.4  9.5         Objective   Blood pressure 138/78, pulse 95, temperature 100 F (37.8 C), temperature source Oral, resp. rate (!) 23, height '5\' 11"'$  (1.803 m), weight 100.8 kg, SpO2 95 %.    Vent Mode: PRVC FiO2 (%):  [35 %] 35 % Set Rate:  [14 bmp] 14 bmp Vt Set:  [550 mL] 550 mL PEEP:  [5 cmH20] 5 cmH20 Plateau Pressure:  [14 cmH20-19 cmH20] 14 cmH20   Intake/Output Summary (Last 24 hours) at 10/12/2021 0748 Last data filed at 10/12/2021 0400 Gross per 24 hour  Intake 1940.53 ml  Output 4025 ml  Net -2084.47 ml    Filed Weights   10/10/21 1120 10/11/21 0500 10/12/21 0310  Weight: 101.9 kg 100 kg 100.8 kg    REVIEW OF SYSTEMS  PATIENT IS UNABLE TO PROVIDE COMPLETE REVIEW OF SYSTEMS DUE TO SEVERE CRITICAL ILLNESS    PHYSICAL EXAMINATION:  GENERAL:critically ill appearing, +resp distress EYES: Pupils equal, round, reactive to light.  No scleral icterus.  MOUTH: Moist mucosal membrane. INTUBATED NECK: Supple.  PULMONARY: +rhonchi, +wheezing CARDIOVASCULAR: S1 and S2.  No murmurs  GASTROINTESTINAL: laparotomy is CDI with staples and honeycomb; no erythema, no drainage+distended  MUSCULOSKELETAL: edema.  NEUROLOGIC: obtunded SKIN:intact,warm,dry    Assessment & Plan:   71 yo obese WM with  s/p exploratory laparotomy and LOA for CT findings concerning for SBO, pneumatosis, and pneumoperitoneum anticipated post-operative ileus 5 Days Post-Op associated with severe hypoxic/hypercapnic resp failure and failure to wean from vent    Severe ACUTE Hypoxic and Hypercapnic Respiratory Failure -continue Mechanical Ventilator support -Wean Fio2 and PEEP as tolerated -VAP/VENT bundle implementation - Wean PEEP & FiO2 as tolerated, maintain SpO2 > 88% - Head of bed elevated 30 degrees, VAP protocol in  place - Plateau pressures less than 30 cm H20  - Intermittent chest x-ray & ABG PRN - Ensure adequate pulmonary hygiene  -will NOT perform SAT/SBT  ENT CONSULTED FOR TRACH  Postoperative hypoxemic respiratory failure- due risk of aspiration (see discussion 10/03/21), decision made to keep on mechanical ventilation until return of bowel function-NOT TOLERATING TF's     ABDOMEN NOT TOLERATING TF's FAILURE TO WEAN FROM VENT ENT CONSULTED FOR [facility] SBO with small bowel ischemia- s/p ex lap LOA 8/12 Postoperative ileus- follow up GEN surg RECS +ABD DISTENTION HOLD TF's    NEUROLOGY ACUTE METABOLIC ENCEPHALOPATHY WUA daily  ACUTE KIDNEY INJURY/Renal Failure -continue Foley Catheter-assess need -Avoid nephrotoxic agents -Follow urine output, BMP -Ensure adequate renal perfusion, optimize oxygenation -Renal dose medications   Intake/Output Summary (Last 24 hours) at 10/12/2021 0752 Last data filed at 10/12/2021 0400 Gross per 24 hour  Intake 1940.53 ml  Output 4025 ml  Net -2084.47 ml        Latest Ref Rng & Units 10/12/2021    4:12 AM 10/11/2021    4:37 AM 10/10/2021    4:05 AM  BMP  Glucose 70 -  99 mg/dL 179  238  244   BUN 8 - 23 mg/dL 62  69  84   Creatinine 0.61 - 1.24 mg/dL 1.51  1.38  1.79   Sodium 135 - 145 mmol/L 154  153  151   Potassium 3.5 - 5.1 mmol/L 4.1  4.3  5.1   Chloride 98 - 111 mmol/L 126  127  126   CO2 22 - 32 mmol/L '25  23  22   '$ Calcium 8.9 - 10.3 mg/dL 9.5  9.4  9.5     ACUTE ANEMIA- TRANSFUSE AS NEEDED CONSIDER TRANSFUSION  IF HGB<7   ENDO - ICU hypoglycemic\Hyperglycemia protocol -check FSBS per protocol   GI GI PROPHYLAXIS as indicated  NUTRITIONAL STATUS DIET-->TF's on hold Constipation protocol as indicated   ELECTROLYTES -follow labs as needed -replace as needed -pharmacy consultation and following   Diet/type: TPN DVT prophylaxis: prophylactic heparin  GI prophylaxis: PPI Lines: PICC Foley:  Yes, and it is still  needed: keep for a few more days    DVT/GI PRX  assessed I Assessed the need for Labs I Assessed the need for Foley I Assessed the need for Central Venous Line Family Discussion when available I Assessed the need for Mobilization I made an Assessment of medications to be adjusted accordingly Safety Risk assessment completed  CASE DISCUSSED IN MULTIDISCIPLINARY ROUNDS WITH ICU TEAM     Critical Care Time devoted to patient care services described in this note is 45 minutes.     Corrin Parker, M.D.  [facility]  Medical Director [employer] Director [facility] Cardio-Pulmonary Department

## 2021-10-12 NOTE — Consult Note (Signed)
PHARMACY - TOTAL PARENTERAL NUTRITION CONSULT NOTE   Indication: Prolonged ileus  Patient Measurements: Height: '5\' 11"'$  (180.3 cm) Weight: 100.8 kg (222 lb 3.6 oz) IBW/kg (Calculated) : 75.3   Body mass index is 30.99 kg/m.  Assessment:  Patient is a 71 y/o M with past medical history including CAD, ischemic cardiomyopathy, DM, HTN, CKD, OSA who presented to the ED 8/12 with abdominal pain, nausea, and vomiting. Imaging was concerning for mesenteric ischemia and patient was taken for emergent exploratory laparotomy with lysis of adhesions and no mesenteric ischemia noted. Patient was extubated 8/13. Post-operative course complicated by delirium, ongoing ileus. Given overall clinical picture, patient was re-intubated 8/15 and PICC was placed to start TPN 8/15. TPN was stopped briefly and is now being restarted  Glucose / Insulin: History of DM (last Hgb A1c 7.1%). BG last 24h: 223 - 282. Currently on moderate SSI q4h + 12u q4h. 6u SSI required last 24h.  Electrolytes: Mild hyperkalemia Renal: Scr 1.1 >> 1.46-->1.51 Hepatic: No transaminitis. LFTs normal Intake / Output; MIVF: I&O: - 1.7 L GI Imaging: 8/12 CT abdomen / pelvis: Concerning for acute mesenteric ischemia. Extensive pneumatosis within several loops of small bowel within the central abdomen with mucosal hypoenhancement. Extensive mesenteric and portal venous gas. Small volume pneumoperitoneum.Multiple dilated loops of small bowel without abrupt transition point. It is uncertain if this is secondary to bowel ischemia or mechanical obstruction. 8/13 CT abdomen / pelvis: Resolution of previously noted pneumatosis and portal venous gas portal venous gas. There are some persistent areas of small-bowel dilatation, with multiple ill-defined loops of small bowel and multiple air-fluid levels. This may simply represent a mild postoperative ileus, although persistent partial small bowel obstruction is not entirely excluded. 8/23 XR Abd:  Nonobstructive bowel gas pattern, with nasogastric tube to the stomach GI Surgeries / Procedures:  8/12: ExLap with lysis of adhesions. No mesenteric ischemia found  Central access: 8/15 TPN start date: 8/15  Nutritional Goals: Goal TPN rate is 70 mL/hr (provides 150 g of protein and 1928 kcals per day)  RD Assessment: Estimated Needs Total Energy Estimated Needs: 2200-2500kcal/day Total Protein Estimated Needs: 150-160g/day Total Fluid Estimated Needs: 2.0L/day  Current Nutrition:  NPO  Propofol at 30 mcg/kg/min  Plan:  restart TPN at 35 mL/hr  Nutritional components Protein (as 15% Clinisol): 74.8 grams Dextrose: 13% Lipids (as 20% SMOFilpids): 29.4 grams kCal 964/24h Electrolytes in TPN: Na 30mq/L, K 432m/L, Ca 43m64mL, Mg 43mE39m, and Phos 143mm46m. Cl:Ac 1:2.69 Add standard MVI and trace elements to TPN  --continue Resistant q4h SSI + 12 units q4h + semglee 20 units BID and adjust as needed  --Monitor TPN labs daily until stable  RodneDallie Piles/2023,11:02 AM

## 2021-10-12 NOTE — Progress Notes (Signed)
Verbal report to this RN from MD Kasa to defer WUA today.

## 2021-10-12 NOTE — Progress Notes (Addendum)
New order per Surgery Clyde Canterbury states that enoxaparin has expired and no new orders for anti coag placed at this time. Order states if new anti coag order is placed, it needs to be HELD (not Help as listed in order) to prepare for trach surgery on 8/25.  Will reiterate to night shift during report.
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
## 2021-10-12 NOTE — Progress Notes (Signed)
RT Fritz Pickerel to bedside per this RN request due to patient having high PAW alarm. RT suctioned moderate amount of secretions from vent as well as patient had moderate amount of secretions/emesis come out around ET tube. Patient now resting comfortably.

## 2021-10-13 ENCOUNTER — Inpatient Hospital Stay: Payer: Medicare HMO | Admitting: Certified Registered"

## 2021-10-13 ENCOUNTER — Other Ambulatory Visit: Payer: Self-pay

## 2021-10-13 ENCOUNTER — Inpatient Hospital Stay: Payer: Medicare HMO

## 2021-10-13 ENCOUNTER — Encounter: Admission: EM | Disposition: E | Payer: Self-pay | Source: Home / Self Care | Attending: Internal Medicine

## 2021-10-13 HISTORY — PX: TRACHEOSTOMY TUBE PLACEMENT: SHX814

## 2021-10-13 LAB — BASIC METABOLIC PANEL
Anion gap: 6 (ref 5–15)
BUN: 50 mg/dL — ABNORMAL HIGH (ref 8–23)
CO2: 23 mmol/L (ref 22–32)
Calcium: 8.9 mg/dL (ref 8.9–10.3)
Chloride: 115 mmol/L — ABNORMAL HIGH (ref 98–111)
Creatinine, Ser: 1.45 mg/dL — ABNORMAL HIGH (ref 0.61–1.24)
GFR, Estimated: 51 mL/min — ABNORMAL LOW (ref >60)
Glucose, Bld: 286 mg/dL — ABNORMAL HIGH (ref 70–99)
Potassium: 3.4 mmol/L — ABNORMAL LOW (ref 3.5–5.1)
Sodium: 144 mmol/L (ref 135–145)

## 2021-10-13 LAB — CBC WITH DIFFERENTIAL/PLATELET
Abs Immature Granulocytes: 0.11 10*3/uL — ABNORMAL HIGH (ref 0.00–0.07)
Basophils Absolute: 0.1 10*3/uL (ref 0.0–0.1)
Basophils Relative: 1 %
Eosinophils Absolute: 0.2 10*3/uL (ref 0.0–0.5)
Eosinophils Relative: 2 %
HCT: 30.4 % — ABNORMAL LOW (ref 39.0–52.0)
Hemoglobin: 9.3 g/dL — ABNORMAL LOW (ref 13.0–17.0)
Immature Granulocytes: 1 %
Lymphocytes Relative: 9 %
Lymphs Abs: 0.8 10*3/uL (ref 0.7–4.0)
MCH: 29 pg (ref 26.0–34.0)
MCHC: 30.6 g/dL (ref 30.0–36.0)
MCV: 94.7 fL (ref 80.0–100.0)
Monocytes Absolute: 0.7 10*3/uL (ref 0.1–1.0)
Monocytes Relative: 8 %
Neutro Abs: 7.1 10*3/uL (ref 1.7–7.7)
Neutrophils Relative %: 79 %
Platelets: 191 10*3/uL (ref 150–400)
RBC: 3.21 MIL/uL — ABNORMAL LOW (ref 4.22–5.81)
RDW: 15.3 % (ref 11.5–15.5)
WBC: 8.8 10*3/uL (ref 4.0–10.5)
nRBC: 0 % (ref 0.0–0.2)

## 2021-10-13 LAB — GLUCOSE, CAPILLARY
Glucose-Capillary: 234 mg/dL — ABNORMAL HIGH (ref 70–99)
Glucose-Capillary: 248 mg/dL — ABNORMAL HIGH (ref 70–99)
Glucose-Capillary: 289 mg/dL — ABNORMAL HIGH (ref 70–99)
Glucose-Capillary: 307 mg/dL — ABNORMAL HIGH (ref 70–99)
Glucose-Capillary: 320 mg/dL — ABNORMAL HIGH (ref 70–99)
Glucose-Capillary: 342 mg/dL — ABNORMAL HIGH (ref 70–99)
Glucose-Capillary: 371 mg/dL — ABNORMAL HIGH (ref 70–99)

## 2021-10-13 LAB — MAGNESIUM: Magnesium: 2.2 mg/dL (ref 1.7–2.4)

## 2021-10-13 LAB — PHOSPHORUS: Phosphorus: 3.7 mg/dL (ref 2.5–4.6)

## 2021-10-13 SURGERY — CREATION, TRACHEOSTOMY
Anesthesia: General

## 2021-10-13 MED ORDER — ONDANSETRON HCL 4 MG/2ML IJ SOLN
INTRAMUSCULAR | Status: DC | PRN
  Administered 2021-10-13: 4 mg via INTRAVENOUS

## 2021-10-13 MED ORDER — ROCURONIUM BROMIDE 100 MG/10ML IV SOLN
INTRAVENOUS | Status: DC | PRN
  Administered 2021-10-13: 30 mg via INTRAVENOUS
  Administered 2021-10-13: 20 mg via INTRAVENOUS

## 2021-10-13 MED ORDER — ROCURONIUM BROMIDE 10 MG/ML (PF) SYRINGE
PREFILLED_SYRINGE | INTRAVENOUS | Status: AC
  Filled 2021-10-13: qty 10

## 2021-10-13 MED ORDER — PROPOFOL 10 MG/ML IV BOLUS
INTRAVENOUS | Status: AC
  Filled 2021-10-13: qty 20

## 2021-10-13 MED ORDER — LIDOCAINE-EPINEPHRINE 1 %-1:100000 IJ SOLN
INTRAMUSCULAR | Status: AC
  Filled 2021-10-13: qty 1

## 2021-10-13 MED ORDER — MIDAZOLAM HCL 2 MG/2ML IJ SOLN
INTRAMUSCULAR | Status: AC
  Filled 2021-10-13: qty 2

## 2021-10-13 MED ORDER — KETAMINE HCL 50 MG/5ML IJ SOSY
PREFILLED_SYRINGE | INTRAMUSCULAR | Status: AC
  Filled 2021-10-13: qty 5

## 2021-10-13 MED ORDER — VITAL 1.5 CAL PO LIQD
1000.0000 mL | ORAL | Status: DC
  Administered 2021-10-13: 1000 mL

## 2021-10-13 MED ORDER — INSULIN ASPART 100 UNIT/ML IJ SOLN
12.0000 [IU] | INTRAMUSCULAR | Status: DC
Start: 2021-10-14 — End: 2021-10-14
  Administered 2021-10-13: 12 [IU] via SUBCUTANEOUS
  Filled 2021-10-13: qty 1

## 2021-10-13 MED ORDER — TRACE MINERALS CU-MN-SE-ZN 300-55-60-3000 MCG/ML IV SOLN
INTRAVENOUS | Status: AC
  Filled 2021-10-13: qty 996.8

## 2021-10-13 MED ORDER — PROSOURCE TF20 ENFIT COMPATIBL EN LIQD
60.0000 mL | Freq: Three times a day (TID) | ENTERAL | Status: DC
  Administered 2021-10-16: 60 mL
  Filled 2021-10-13: qty 60

## 2021-10-13 MED ORDER — ONDANSETRON HCL 4 MG/2ML IJ SOLN
INTRAMUSCULAR | Status: AC
  Filled 2021-10-13: qty 2

## 2021-10-13 MED ORDER — LACTATED RINGERS IV SOLN: INTRAVENOUS | Status: DC | PRN

## 2021-10-13 MED ORDER — ENOXAPARIN SODIUM 60 MG/0.6ML IJ SOSY
0.5000 mg/kg | PREFILLED_SYRINGE | INTRAMUSCULAR | Status: DC
  Administered 2021-10-14: 50 mg via SUBCUTANEOUS
  Filled 2021-10-13: qty 0.6

## 2021-10-13 MED ORDER — INSULIN GLARGINE-YFGN 100 UNIT/ML ~~LOC~~ SOLN
30.0000 [IU] | Freq: Two times a day (BID) | SUBCUTANEOUS | Status: DC
Start: 2021-10-13 — End: 2021-10-14
  Administered 2021-10-13: 30 [IU] via SUBCUTANEOUS
  Filled 2021-10-13: qty 0.3

## 2021-10-13 MED ORDER — FENTANYL CITRATE (PF) 100 MCG/2ML IJ SOLN
INTRAMUSCULAR | Status: DC | PRN
  Administered 2021-10-13: 100 ug via INTRAVENOUS

## 2021-10-13 MED ORDER — INSULIN GLARGINE-YFGN 100 UNIT/ML ~~LOC~~ SOLN
10.0000 [IU] | Freq: Once | SUBCUTANEOUS | Status: AC
  Administered 2021-10-13: 10 [IU] via SUBCUTANEOUS
  Filled 2021-10-13: qty 0.1

## 2021-10-13 MED ORDER — LIDOCAINE HCL (PF) 2 % IJ SOLN
INTRAMUSCULAR | Status: AC
  Filled 2021-10-13: qty 5

## 2021-10-13 MED ORDER — INSULIN ASPART 100 UNIT/ML IJ SOLN
15.0000 [IU] | Freq: Once | INTRAMUSCULAR | Status: AC
  Administered 2021-10-13: 15 [IU] via SUBCUTANEOUS
  Filled 2021-10-13: qty 1

## 2021-10-13 MED ORDER — KETAMINE HCL 10 MG/ML IJ SOLN
INTRAMUSCULAR | Status: DC | PRN
  Administered 2021-10-13: 50 mg via INTRAVENOUS

## 2021-10-13 MED ORDER — POTASSIUM CHLORIDE 20 MEQ PO PACK
40.0000 meq | PACK | Freq: Once | ORAL | Status: AC
  Administered 2021-10-13: 40 meq
  Filled 2021-10-13: qty 2

## 2021-10-13 MED ORDER — MIDAZOLAM HCL 2 MG/2ML IJ SOLN
INTRAMUSCULAR | Status: DC | PRN
  Administered 2021-10-13: 2 mg via INTRAVENOUS

## 2021-10-13 MED ORDER — FENTANYL CITRATE (PF) 100 MCG/2ML IJ SOLN
INTRAMUSCULAR | Status: AC
  Filled 2021-10-13: qty 2

## 2021-10-13 MED ORDER — INSULIN ASPART 100 UNIT/ML IJ SOLN
25.0000 [IU] | Freq: Once | INTRAMUSCULAR | Status: AC
Start: 2021-10-13 — End: 2021-10-13
  Administered 2021-10-13: 25 [IU] via SUBCUTANEOUS

## 2021-10-13 MED ORDER — LIDOCAINE-EPINEPHRINE 1 %-1:100000 IJ SOLN
INTRAMUSCULAR | Status: DC | PRN
  Administered 2021-10-13: 5 mL

## 2021-10-13 SURGICAL SUPPLY — 37 items
BLADE SURG 15 STRL LF DISP TIS (BLADE) ×1 IMPLANT
BLADE SURG 15 STRL SS (BLADE) ×1
BLADE SURG SZ11 CARB STEEL (BLADE) ×1 IMPLANT
DRAPE MAG INST 16X20 L/F (DRAPES) ×1 IMPLANT
ELECT CAUTERY BLADE TIP 2.5 (TIP) ×1
ELECT REM PT RETURN 9FT ADLT (ELECTROSURGICAL) ×1
ELECTRODE CAUTERY BLDE TIP 2.5 (TIP) ×1 IMPLANT
ELECTRODE REM PT RTRN 9FT ADLT (ELECTROSURGICAL) ×1 IMPLANT
GAUZE 4X4 16PLY ~~LOC~~+RFID DBL (SPONGE) ×1 IMPLANT
GLOVE BIO SURGEON STRL SZ7.5 (GLOVE) ×1 IMPLANT
GOWN STRL REUS W/ TWL LRG LVL3 (GOWN DISPOSABLE) ×2 IMPLANT
GOWN STRL REUS W/TWL LRG LVL3 (GOWN DISPOSABLE) ×2
HEMOSTAT SURGICEL 2X3 (HEMOSTASIS) IMPLANT
HLDR TRACH TUBE NECKBAND 18 (MISCELLANEOUS) ×1 IMPLANT
HOLDER TRACH TUBE NECKBAND 18 (MISCELLANEOUS) ×1
KIT TURNOVER KIT A (KITS) ×1 IMPLANT
LABEL OR SOLS (LABEL) ×1 IMPLANT
MANIFOLD NEPTUNE II (INSTRUMENTS) ×1 IMPLANT
NS IRRIG 500ML POUR BTL (IV SOLUTION) ×1 IMPLANT
PACK HEAD/NECK (MISCELLANEOUS) ×1 IMPLANT
SHEARS HARMONIC 9CM CVD (BLADE) ×1 IMPLANT
SPONGE DRAIN TRACH 4X4 STRL 2S (GAUZE/BANDAGES/DRESSINGS) ×1 IMPLANT
SPONGE KITTNER 5P (MISCELLANEOUS) ×1 IMPLANT
SUCTION FRAZIER HANDLE 10FR (MISCELLANEOUS)
SUCTION TUBE FRAZIER 10FR DISP (MISCELLANEOUS) IMPLANT
SUT ETHILON 2 0 FS 18 (SUTURE) ×1 IMPLANT
SUT SILK 2 0 (SUTURE) ×1
SUT SILK 2 0 SH (SUTURE) IMPLANT
SUT SILK 2-0 18XBRD TIE 12 (SUTURE) ×1 IMPLANT
SUT VIC AB 3-0 PS2 18 (SUTURE) IMPLANT
SYR 10ML LL (SYRINGE) ×1 IMPLANT
TRAP FLUID SMOKE EVACUATOR (MISCELLANEOUS) ×1 IMPLANT
TUBE TRACH  6.0 CUFF FLEX (MISCELLANEOUS)
TUBE TRACH 6.0 CUFF FLEX (MISCELLANEOUS) IMPLANT
TUBE TRACH FLEX 8.0 CUFF (MISCELLANEOUS) ×1
TUBE TRACH FLEX 8.5 CUFF (MISCELLANEOUS) IMPLANT
WATER STERILE IRR 500ML POUR (IV SOLUTION) ×1 IMPLANT

## 2021-10-13 NOTE — H&P (Signed)
History and physical reviewed. No change in medical status reported by the CCU, appears stable for surgery. All questions regarding the procedure answered, and family expressed understanding of the procedure.  Todd Spencer '@TODAY'$ @

## 2021-10-13 NOTE — TOC Progression Note (Signed)
Transition of Care [facility]) - Progression Note    Patient Details  Name: Todd Spencer MRN: [id number] Date of Birth: [birth year]  [facility] CM/SW Contact  Shelbie Hutching, RN Phone Number: 09/28/2021, 1:55 PM  Clinical Narrative:    Patient went to the OR this morning for tracheotomy.   RNCM received a call from Kennard at [employer], she reports that the patient will be a candidate for LTAC but for the insurance to approve the patient must have 3 weaning trials on 3 separate days before they will review for authorization, that is for either [facility] or [facility].  This information relayed to the respiratory therapist to pass along.     Expected Discharge Plan:  (TBD) Barriers to Discharge: Continued Medical Work up  Expected Discharge Plan and Services Expected Discharge Plan:  (TBD)   Discharge Planning Services: CM Consult   Living arrangements for the past 2 months: Single Family Home                                       Social Determinants of Health (SDOH) Interventions    Readmission Risk Interventions     No data to display

## 2021-10-13 NOTE — Progress Notes (Signed)
[facility] Day(s): [place] op day(s): Day of Surgery.   Interval History:  Patient seen and examined No further emesis; tube feedings being held, Dobhoff placed post Trach today... KUB pndg. No fevers WBC again normalized; 8.8K Hgb stable at 9.3 Renal function remains variable; sCr - 1.45; UO - 2400 ccs   Vital signs in last 24 hours: [min-max] current  Temp:  [98.6 F (37 C)-99.9 F (37.7 C)] 99.2 F (37.3 C) (08/25 0400) Pulse Rate:  [80-108] 87 (08/25 0600) Resp:  [14-27] 25 (08/25 0600) BP: (122-149)/(67-90) 132/67 (08/25 0600) SpO2:  [90 %-98 %] 95 % (08/25 0733) FiO2 (%):  [35 %] 35 % (08/25 0733) Weight:  [102.2 kg] 102.2 kg (08/25 0500)     Height: '5\' 11"'$  (180.3 cm) Weight: 102.2 kg BMI (Calculated): 31.44   Intake/Output last 2 shifts:  08/24 0701 - 08/25 0700 In: 5498.3 [I.V.:3121.5; NG/GT:2376.8] Out: 2600 [Urine:2400; Emesis/NG output:200]   Physical Exam:  Constitutional: Sedated, intubated  HEENT: NGT in place Respiratory: On ventilator  Cardiovascular: regular rate and sinus rhythm  Gastrointestinal: Soft, distension improving. Unable to assess tenderness  Genitourinary: Condom catheter in place; clear urine.  Integumentary: Laparotomy is CDI with staples; no erythema, no drainage  Labs:     Latest Ref Rng & Units 09/27/2021    4:52 AM 10/12/2021    4:12 AM 10/11/2021    4:37 AM  CBC  WBC 4.0 - 10.5 K/uL 8.8  8.3  10.6   Hemoglobin 13.0 - 17.0 g/dL 9.3  9.9  10.2   Hematocrit 39.0 - 52.0 % 30.4  33.5  33.7   Platelets 150 - 400 K/uL 191  213  223       Latest Ref Rng & Units 10/12/2021    4:52 AM 10/12/2021    4:12 AM 10/11/2021    4:37 AM  CMP  Glucose 70 - 99 mg/dL 286  179  238   BUN 8 - 23 mg/dL 50  62  69   Creatinine 0.61 - 1.24 mg/dL 1.45  1.51  1.38   Sodium 135 - 145 mmol/L 144  154  153   Potassium 3.5 - 5.1 mmol/L 3.4  4.1  4.3   Chloride 98 - 111 mmol/L 115  126  127   CO2 22 -  32 mmol/L '23  25  23   '$ Calcium 8.9 - 10.3 mg/dL 8.9  9.5  9.4      Imaging studies: CLINICAL DATA:  NG tube placement.   EXAM: ABDOMEN - 1 VIEW   COMPARISON:  Abdominal radiographs 10/11/2021   FINDINGS: The previous enteric tube has been removed, and a new weighted enteric tube has been placed with tip in the right upper quadrant in the expected region of the gastric pylorus. No dilated loops of bowel are seen in the included portion of the abdomen to indicate obstruction. Right-sided abdominal surgical clips and skin staples are noted. Opacity in the left lung base is suggestive of atelectasis, with a small left pleural effusion also possible.   IMPRESSION: Enteric tube terminating over the distal stomach.     Electronically Signed   By: Logan Bores M.D.   On: 10/01/2021 11:10   Assessment/Plan:  71 y.o. male with resolved post-operative ileus, requiring ventilator support, Day of Surgery s/p exploratory laparotomy and LOA for CT findings concerning for SBO, pneumatosis, and pneumoperitoneum   - Okay to resume trickle feedings;  - Monitor abdominal  examination; on-going bowel function   - OOB once feasible  - Staples can typically come out, anytime, but will leave for now as precaution  - Further management per primary service; we will follow peripherally/periodically at this point, expectant of Dobbhoff functioning for enteric feedings.    -- Ronny Bacon, M.D. [facility] 09/25/2021, 11:55 AM
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
## 2021-10-13 NOTE — Inpatient Diabetes Management (Signed)
Inpatient Diabetes Program Recommendations  AACE/ADA: New Consensus Statement on Inpatient Glycemic Control (2015)  Target Ranges:  Prepandial:   less than 140 mg/dL      Peak postprandial:   less than 180 mg/dL (1-2 hours)      Critically ill patients:  140 - 180 mg/dL    Latest Reference Range & Units 10/11/21 23:21 10/12/21 03:28 10/12/21 07:25 10/12/21 11:03 10/12/21 15:17 10/12/21 19:23  Glucose-Capillary 70 - 99 mg/dL 119 (H) 167 (H)  16 units Novolog  158 (H)  16 units Novolog   20 units Semglee '@0941'$   142 (H)  15 units Novolog  180 (H)  16 units Novolog  223 (H)  19 units Novolog   20 units Semglee '@2110'$     Latest Reference Range & Units 10/12/21 23:15 10/18/2021 03:43 09/19/2021 07:28 09/29/2021 11:34  Glucose-Capillary 70 - 99 mg/dL 251 (H)  23 units Novolog  248 (H)  19 units Novolog  234 (H) 289 (H)     Home DM Meds: Lantus 100 units QHS        Metformin 1500 mg QPM        Regular Insulin 50 units TID   Current Orders: Semglee 20 units BID      Novolog Resistant Correction Scale/ SSI (0-20 units) Q4 hours      Novolog 12 units Q4 hours (Tube Feed Coverage)      TPN running 70cc/hr (No Insulin)  Tube feeds off since Midnight--Was at trickle rate 10cc/hr prior to Surgery  Tube Feeds to restart today at 15cc/hr and increase by 10 cc/hr Q8 hours until goal rate is reached   Recommend the following:  1. Increase Semglee to 30 units BID (0.6 units/kg)--pt takes Lantus 100 units QHS at home)  2. Stop the Novolog Tube Feed Coverage 12 units Q4 hours for now since TF are NOT at goal  Can restart the Novolog Tube Feed Coverage once tube feeds are at goal rate  3. Give extra 10 units Semglee insulin X 1 dose this AM    --Will follow patient during hospitalization--  Wyn Quaker RN, MSN, [employer] Diabetes Coordinator Inpatient Glycemic Control Team Team Pager: [phone] (8a-5p)
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
## 2021-10-13 NOTE — Consult Note (Addendum)
PHARMACY - TOTAL PARENTERAL NUTRITION CONSULT NOTE   Indication: Prolonged ileus  Patient Measurements: Height: '5\' 11"'$  (180.3 cm) Weight: 102.2 kg (225 lb 5 oz) IBW/kg (Calculated) : 75.3   Body mass index is 31.42 kg/m.  Assessment:  Patient is a 71 y/o M with past medical history including CAD, ischemic cardiomyopathy, DM, HTN, CKD, OSA who presented to the ED 8/12 with abdominal pain, nausea, and vomiting. Imaging was concerning for mesenteric ischemia and patient was taken for emergent exploratory laparotomy with lysis of adhesions and no mesenteric ischemia noted. Patient was extubated 8/13. Post-operative course complicated by delirium, ongoing ileus. Given overall clinical picture, patient was re-intubated 8/15 and PICC was placed to start TPN 8/15. TPN was stopped briefly and is now being restarted  Glucose / Insulin: History of DM (last Hgb A1c 7.1%). BG last 24h: 142 - 286. Currently on moderate SSI q4h + 12u q4h. 36u SSI required last 24h.  Electrolytes: Mild hyperkalemia Renal: Scr 1.1 >> 1.45 Hepatic: No transaminitis. LFTs normal Intake / Output; MIVF: I&O: - 1.7 L GI Imaging: 8/12 CT abdomen / pelvis: Concerning for acute mesenteric ischemia. Extensive pneumatosis within several loops of small bowel within the central abdomen with mucosal hypoenhancement. Extensive mesenteric and portal venous gas. Small volume pneumoperitoneum.Multiple dilated loops of small bowel without abrupt transition point. It is uncertain if this is secondary to bowel ischemia or mechanical obstruction. 8/13 CT abdomen / pelvis: Resolution of previously noted pneumatosis and portal venous gas portal venous gas. There are some persistent areas of small-bowel dilatation, with multiple ill-defined loops of small bowel and multiple air-fluid levels. This may simply represent a mild postoperative ileus, although persistent partial small bowel obstruction is not entirely excluded. 8/23 XR Abd:  Nonobstructive bowel gas pattern, with nasogastric tube to the stomach GI Surgeries / Procedures:  8/12: ExLap with lysis of adhesions. No mesenteric ischemia found  Central access: 8/15 TPN start date: 8/15  Nutritional Goals: Goal TPN rate is 70 mL/hr (provides 150 g of protein and 1928 kcals per day)  RD Assessment: Estimated Needs Total Energy Estimated Needs: 2200-2500kcal/day Total Protein Estimated Needs: 150-160g/day Total Fluid Estimated Needs: 2.0L/day  Current Nutrition:  NPO  Propofol: 19.2 ml/hr- provides 507kcal/day   Plan:  advance TPN to 70 mL/hr  Nutritional components Protein (as 15% Clinisol): 149.5 grams Dextrose: 13% Lipids (as 20% SMOFilpids): 58.8 grams kCal 1928 /24h Electrolytes in TPN: Na 52mq/L, K 479m/L, Ca 78m36mL, Mg 78mE38m, and Phos 178mm9m. Cl:Ac 1:1.57 Add standard MVI and trace elements to TPN  --Insulin: continue Resistant q4h SSI  Stop Novolog 12 units q4h (tube feeds now at trickle)  Increase semglee to 30 units BID  adjust as needed  --Monitor TPN labs daily until stable  RodneDallie Piles/2023,7:10 AM

## 2021-10-13 NOTE — Op Note (Signed)
09/27/2021  8:18 AM    Todd Spencer  [id number]   Pre-Op Diagnosis:  Failure to wean from vent.  Post-op Diagnosis: Failure to wean from vent.  Procedure: Elective Tracheostomy  Surgeon:  [name]  Assistant: none  Anesthesia:  General endotracheal anesthesia  EBL:  less than 25 cc  Complications:  None  Findings: None  Procedure: The patient was taken to the Operating Room from the CCU, already intubated, and placed in the supine position.  After induction of general anesthesia, the patient was placed on a shoulder roll with the neck extended. The skin was injected along the proposed incision line over the trachea with 1% lidocaine with epinephrine, 1:100,000. The area was then prepped and draped in the usual sterile fashion.  A 15 blade was then used to incise the skin in a horizontal incision over the trachea. The dissection was carried down to the subcutaneous tissues and through the platysma with the Bovie. Anterior jugular veins were divided with the harmonic scalpel for hemostasis. The strap muscles were divided in the midline and retracted laterally. The thyroid isthmus was exposed and divided in the midline over the trachea and dissected away from the anterior aspect of the trachea with the Bovie. With hemostasis obtained, the anesthesiologist was alerted that the airway was about to be entered so that the oxygen concentration could be lowered to reduce fire risk. The scrub tech prepared the tracheostomy tube, confirming no leak in the balloon cuff. The trachea was then incised between the 2nd and third tracheal rings, and an inferiorly based tracheal flap created by cutting through the third tracheal ring laterally. This was sutured up to the skin with a 3-0 vicryl suture to help create a tracheocutaneous tract. The airway was suctioned and, after the anesthesiologist pulled the endotracheal tube back,  a #8 [place]iley cuffed tracheostomy tube was inserted into the  tracheal lumen. The inner cannula was placed, the cuff inflated,  and the patient hooked to the anesthesia circuit for ventilation. CO2 return and adequate ventilation was confirmed with the anesthesiologist. Hemostasis was confirmed and Surgicel placed in the wound to help with minor oozing. The flange of the tracheostomy tube was sutured to the skin with 3-0 ethilon suture. A trach tie was placed around the neck to further secure the tracheostomy tube. Betadine soaked gauze was placed around the wound.  The patient was then returned to the anesthesiologist and taken to the CCU in stable condition.  Disposition:   Return to the CCU  Plan: Routine trach care and suctioning each shift and PRN. Vent settings per CCU admitting physician. Sutures can be removed in a week.  [name] 09/29/2021 8:18 AM

## 2021-10-13 NOTE — Transfer of Care (Signed)
Immediate Anesthesia Transfer of Care Note  Patient: Todd Spencer  Procedure(s) Performed: TRACHEOSTOMY  Patient Location: PACU  Anesthesia Type:General  Level of Consciousness: sedated  Airway & Oxygen Therapy: Patient remains intubated per anesthesia plan and Patient placed on Ventilator (see vital sign flow sheet for setting)  Post-op Assessment: Report given to RN and Post -op Vital signs reviewed and stable  Post vital signs: Reviewed and stable  Last Vitals:  Vitals Value Taken Time  BP 156/81 09/24/2021 0830  Temp    Pulse 95 09/26/2021 0833  Resp 23 10/12/2021 0833  SpO2 92 % 09/19/2021 0833  Vitals shown include unvalidated device data.  Last Pain:  Vitals:   10/06/2021 0400  TempSrc: Oral  PainSc:          Complications: No notable events documented.

## 2021-10-13 NOTE — Progress Notes (Signed)
NAME:  Todd Spencer, MRN:  [id number], DOB:  [birth year], LOS: 55 ADMISSION DATE:  09/26/2021, CONSULTATION DATE: 10/06/2021  History of Present Illness/SYNOPSIS  71 year old male with a history of CAD, ischemic cardiomyopathy, type 2 diabetes, HTN, CKD, and OSA who presented to the ED with abdominal pain, nausea and vomiting. CT scan of the abdomen was performed showing portal venous and mesenteric gas, highly concerning for mesenteric ischemia.  He was started on antibiotics and taken emergently to the OR by Dr. Christian Mate for an exploratory laparotomy. History was mostly obtained by chart review as the patient was transferred to the ICU intubated following his procedure.   Dr. Christian Mate noted multiple adhesions in the abdomen which were excised and lysed. A mild non-compromised transition area was noted, with the small bowel appearing healthy and well perfused, with no signs of discoloration pneumatosis or perforation. Similarly, the colon was healthy appearing. Following the procedure, he was transferred to the ICU for gradual extubation and further care.  Pertinent  Medical History  CAD s/p stenting to his RCA and LAD HFrEF, ischemic cardiomyopathy HTN HLD T2DM OSA on CPAP  Significant Hospital Events: Including procedures, antibiotic start and stop dates in addition to other pertinent events   10/10/2021: Exploratory laparotomy with lysis of adhesions, no       mesenteric ischemia noted 10/11/2021: Overnight pt with 450 ml of bloody OGT concerning       for possible GIB and worsening hyperkalemia  09/27/2021: Pt extubated to [place]  10/01/2021: CT Chest/Abd/Pelvis revealed Resolution of previously noted pneumatosis and portal venous gas portal venous gas. There are some persistent areas of small-bowel dilatation, with multiple ill-defined loops of small bowel and multiple air-fluid levels. This may simply represent a mild postoperative ileus, although persistent partial small bowel  obstruction is not entirely excluded. Trace volume of ascites. Dependent opacities in the lung bases bilaterally favored to predominantly reflect areas of subsegmental atelectasis and/or chronic scarring, although a small amount of airspace consolidation is not excluded in the posterior aspect of the right lower lobe. Aortic atherosclerosis, in addition to left main and three-vessel coronary artery disease. Assessment for potential risk factor modification, dietary therapy or pharmacologic therapy may be warranted, if clinically indicated. Additional incidental findings, as above. 10/02/2021: Pt developed worsening delirium/agitation overnight requiring precedex gtt.  Pt lethargic this am but able to follow commands with no signs of agitation.  Will attempt to wean off Bipap to HFNC once mentation improves.  Minimal NGT output overnight  10/03/2021- Trickle feeds initiated on 08/14 '@10'$  ml/hr.  However, overnight he pulled the NGT out.  This am he is having worsening abdominal distension and pain. KUB revealed ileus pattern with slightly progressing bowel gas pattern.  Orders placed to reinsert NGT once placement confirmed will place to LIS.  Intubated to allow time for ileus to resolve (see discussion 10/03/21 note) 8/18 remains on vent, ABD distended, failure to wean from vent 8/19 remains on vent, ABD distended, tolerated TF's 8/20 DID NOT TOLERATE TUBE FEEDS 8/21 severe hypoxia, possible mucus plugs 8/22 tolerating feeds family to come to bedside today 8/23 failure to wean from vent, ENT consulted for [facility] 8/25 [facility]  Interim History / Subjective:  Remains critically ill +ileus did NOT tolerate TFs  Prognosis is guarded Failure to wean from vent ENT consulted for [facility]      Latest Ref Rng & Units 10/12/2021    4:52 AM 10/12/2021    4:12 AM 10/11/2021    4:37  AM  BMP  Glucose 70 - 99 mg/dL 286  179  238   BUN 8 - 23 mg/dL 50  62  69   Creatinine 0.61 - 1.24 mg/dL 1.45  1.51  1.38   Sodium  135 - 145 mmol/L 144  154  153   Potassium 3.5 - 5.1 mmol/L 3.4  4.1  4.3   Chloride 98 - 111 mmol/L 115  126  127   CO2 22 - 32 mmol/L '23  25  23   '$ Calcium 8.9 - 10.3 mg/dL 8.9  9.5  9.4         Objective   Blood pressure 132/67, pulse 87, temperature 99.2 F (37.3 C), temperature source Oral, resp. rate (!) 25, height '5\' 11"'$  (1.803 m), weight 102.2 kg, SpO2 95 %.    Vent Mode: PRVC FiO2 (%):  [35 %] 35 % Set Rate:  [14 bmp] 14 bmp Vt Set:  [550 mL] 550 mL PEEP:  [5 cmH20] 5 cmH20 Plateau Pressure:  [21 cmH20] 21 cmH20   Intake/Output Summary (Last 24 hours) at 10/08/2021 1557 Last data filed at 10/17/2021 8546 Gross per 24 hour  Intake 2309.03 ml  Output 1235 ml  Net 1074.03 ml    Filed Weights   10/11/21 0500 10/12/21 0310 10/15/2021 0500  Weight: 100 kg 100.8 kg 102.2 kg    REVIEW OF SYSTEMS  PATIENT IS UNABLE TO PROVIDE COMPLETE REVIEW OF SYSTEMS DUE TO SEVERE CRITICAL ILLNESS    PHYSICAL EXAMINATION:  GENERAL:critically ill appearing, +resp distress EYES: Pupils equal, round, reactive to light.  No scleral icterus.  MOUTH: Moist mucosal membrane. INTUBATED NECK: Supple.  PULMONARY: +rhonchi, +wheezing CARDIOVASCULAR: S1 and S2.  No murmurs  GASTROINTESTINAL: laparotomy is CDI with staples and honeycomb; no erythema, no drainage+distended  MUSCULOSKELETAL: edema.  NEUROLOGIC: obtunded SKIN:intact,warm,dry    Assessment & Plan:   71 yo obese WM with  s/p exploratory laparotomy and LOA for CT findings concerning for SBO, pneumatosis, and pneumoperitoneum anticipated post-operative ileus 73 Days Post-Op associated with severe hypoxic/hypercapnic resp failure and failure to wean from vent    Severe ACUTE Hypoxic and Hypercapnic Respiratory Failure -continue Mechanical Ventilator support -Wean Fio2 and PEEP as tolerated -VAP/VENT bundle implementation - Wean PEEP & FiO2 as tolerated, maintain SpO2 > 88% - Head of bed elevated 30 degrees, VAP protocol in  place - Plateau pressures less than 30 cm H20  - Intermittent chest x-ray & ABG PRN - Ensure adequate pulmonary hygiene  -will NOT perform SAT/SBT  ENT CONSULTED FOR TRACH, COMPLETED TODAY  Postoperative hypoxemic respiratory failure- due risk of aspiration (see discussion 10/03/21), decision made to keep on mechanical ventilation until return of bowel function-NOT TOLERATING TF's     ABDOMEN NOT TOLERATING TF's FAILURE TO WEAN FROM VENT ENT CONSULTED FOR [facility] SBO with small bowel ischemia- s/p ex lap LOA 8/12 Postoperative ileus- follow up GEN surg RECS Resume trickle feed and TPN    NEUROLOGY ACUTE METABOLIC ENCEPHALOPATHY WUA daily  ACUTE KIDNEY INJURY/Renal Failure -continue Foley Catheter-assess need -Avoid nephrotoxic agents -Follow urine output, BMP -Ensure adequate renal perfusion, optimize oxygenation -Renal dose medications   Intake/Output Summary (Last 24 hours) at 10/05/2021 1557 Last data filed at 10/16/2021 0826 Gross per 24 hour  Intake 2309.03 ml  Output 1235 ml  Net 1074.03 ml         Latest Ref Rng & Units 09/28/2021    4:52 AM 10/12/2021    4:12 AM 10/11/2021    4:37 AM  BMP  Glucose 70 - 99 mg/dL 286  179  238   BUN 8 - 23 mg/dL 50  62  69   Creatinine 0.61 - 1.24 mg/dL 1.45  1.51  1.38   Sodium 135 - 145 mmol/L 144  154  153   Potassium 3.5 - 5.1 mmol/L 3.4  4.1  4.3   Chloride 98 - 111 mmol/L 115  126  127   CO2 22 - 32 mmol/L '23  25  23   '$ Calcium 8.9 - 10.3 mg/dL 8.9  9.5  9.4     ACUTE ANEMIA- TRANSFUSE AS NEEDED CONSIDER TRANSFUSION  IF HGB<7   ENDO - ICU hypoglycemic\Hyperglycemia protocol -check FSBS per protocol   GI GI PROPHYLAXIS as indicated  NUTRITIONAL STATUS DIET-->TF's on hold Constipation protocol as indicated   ELECTROLYTES -follow labs as needed -replace as needed -pharmacy consultation and following   Diet/type: TPN DVT prophylaxis: prophylactic heparin  GI prophylaxis: PPI Lines: PICC Foley:  Yes,  and it is still needed: keep for a few more days    DVT/GI PRX  assessed I Assessed the need for Labs I Assessed the need for Foley I Assessed the need for Central Venous Line Family Discussion when available I Assessed the need for Mobilization I made an Assessment of medications to be adjusted accordingly Safety Risk assessment completed  CASE DISCUSSED IN MULTIDISCIPLINARY ROUNDS WITH ICU TEAM     Critical Care Time devoted to patient care services described in this note is 45 minutes.

## 2021-10-14 ENCOUNTER — Inpatient Hospital Stay: Payer: Medicare HMO

## 2021-10-14 LAB — CBC WITH DIFFERENTIAL/PLATELET
Abs Immature Granulocytes: 0.16 10*3/uL — ABNORMAL HIGH (ref 0.00–0.07)
Basophils Absolute: 0.1 10*3/uL (ref 0.0–0.1)
Basophils Relative: 1 %
Eosinophils Absolute: 0.1 10*3/uL (ref 0.0–0.5)
Eosinophils Relative: 1 %
HCT: 34.3 % — ABNORMAL LOW (ref 39.0–52.0)
Hemoglobin: 10.6 g/dL — ABNORMAL LOW (ref 13.0–17.0)
Immature Granulocytes: 2 %
Lymphocytes Relative: 6 %
Lymphs Abs: 0.6 10*3/uL — ABNORMAL LOW (ref 0.7–4.0)
MCH: 29 pg (ref 26.0–34.0)
MCHC: 30.9 g/dL (ref 30.0–36.0)
MCV: 94 fL (ref 80.0–100.0)
Monocytes Absolute: 0.7 10*3/uL (ref 0.1–1.0)
Monocytes Relative: 7 %
Neutro Abs: 8.2 10*3/uL — ABNORMAL HIGH (ref 1.7–7.7)
Neutrophils Relative %: 83 %
Platelets: 216 10*3/uL (ref 150–400)
RBC: 3.65 MIL/uL — ABNORMAL LOW (ref 4.22–5.81)
RDW: 15 % (ref 11.5–15.5)
WBC: 9.8 10*3/uL (ref 4.0–10.5)
nRBC: 0 % (ref 0.0–0.2)

## 2021-10-14 LAB — GLUCOSE, CAPILLARY
Glucose-Capillary: 377 mg/dL — ABNORMAL HIGH (ref 70–99)
Glucose-Capillary: 371 mg/dL — ABNORMAL HIGH (ref 70–99)
Glucose-Capillary: 284 mg/dL — ABNORMAL HIGH (ref 70–99)
Glucose-Capillary: 346 mg/dL — ABNORMAL HIGH (ref 70–99)
Glucose-Capillary: 315 mg/dL — ABNORMAL HIGH (ref 70–99)
Glucose-Capillary: 292 mg/dL — ABNORMAL HIGH (ref 70–99)
Glucose-Capillary: 277 mg/dL — ABNORMAL HIGH (ref 70–99)
Glucose-Capillary: 272 mg/dL — ABNORMAL HIGH (ref 70–99)
Glucose-Capillary: 255 mg/dL — ABNORMAL HIGH (ref 70–99)
Glucose-Capillary: 225 mg/dL — ABNORMAL HIGH (ref 70–99)
Glucose-Capillary: 239 mg/dL — ABNORMAL HIGH (ref 70–99)
Glucose-Capillary: 207 mg/dL — ABNORMAL HIGH (ref 70–99)
Glucose-Capillary: 169 mg/dL — ABNORMAL HIGH (ref 70–99)
Glucose-Capillary: 178 mg/dL — ABNORMAL HIGH (ref 70–99)
Glucose-Capillary: 157 mg/dL — ABNORMAL HIGH (ref 70–99)
Glucose-Capillary: 165 mg/dL — ABNORMAL HIGH (ref 70–99)
Glucose-Capillary: 139 mg/dL — ABNORMAL HIGH (ref 70–99)
Glucose-Capillary: 160 mg/dL — ABNORMAL HIGH (ref 70–99)
Glucose-Capillary: 157 mg/dL — ABNORMAL HIGH (ref 70–99)
Glucose-Capillary: 145 mg/dL — ABNORMAL HIGH (ref 70–99)

## 2021-10-14 LAB — PHOSPHORUS: Phosphorus: 2.7 mg/dL (ref 2.5–4.6)

## 2021-10-14 LAB — BASIC METABOLIC PANEL
Anion gap: 7 (ref 5–15)
BUN: 54 mg/dL — ABNORMAL HIGH (ref 8–23)
CO2: 20 mmol/L — ABNORMAL LOW (ref 22–32)
Calcium: 9.3 mg/dL (ref 8.9–10.3)
Chloride: 118 mmol/L — ABNORMAL HIGH (ref 98–111)
Creatinine, Ser: 1.38 mg/dL — ABNORMAL HIGH (ref 0.61–1.24)
GFR, Estimated: 54 mL/min — ABNORMAL LOW (ref >60)
Glucose, Bld: 347 mg/dL — ABNORMAL HIGH (ref 70–99)
Potassium: 3.8 mmol/L (ref 3.5–5.1)
Sodium: 145 mmol/L (ref 135–145)

## 2021-10-14 LAB — TRIGLYCERIDES: Triglycerides: 462 mg/dL — ABNORMAL HIGH (ref <150)

## 2021-10-14 LAB — MAGNESIUM: Magnesium: 2.4 mg/dL (ref 1.7–2.4)

## 2021-10-14 MED ORDER — DEXTROSE 50 % IV SOLN: 0.0000 mL | INTRAVENOUS | Status: DC | PRN

## 2021-10-14 MED ORDER — INSULIN REGULAR(HUMAN) IN NACL 100-0.9 UT/100ML-% IV SOLN
INTRAVENOUS | Status: DC
  Administered 2021-10-14: 13 [IU]/h via INTRAVENOUS
  Administered 2021-10-14: 10.5 [IU]/h via INTRAVENOUS
  Administered 2021-10-14: 28 [IU]/h via INTRAVENOUS
  Administered 2021-10-14: 24 [IU]/h via INTRAVENOUS
  Administered 2021-10-15: 6.5 [IU]/h via INTRAVENOUS
  Administered 2021-10-15: 9.5 [IU]/h via INTRAVENOUS
  Administered 2021-10-16: 18 [IU]/h via INTRAVENOUS
  Administered 2021-10-16: 6.5 [IU]/h via INTRAVENOUS
  Administered 2021-10-17: 14 [IU]/h via INTRAVENOUS
  Administered 2021-10-17: 17 [IU]/h via INTRAVENOUS
  Administered 2021-10-18: 21 [IU]/h via INTRAVENOUS
  Filled 2021-10-14 (×14): qty 100

## 2021-10-14 MED ORDER — TRACE MINERALS CU-MN-SE-ZN 300-55-60-3000 MCG/ML IV SOLN
INTRAVENOUS | Status: AC
  Filled 2021-10-14: qty 498.4

## 2021-10-14 MED ORDER — POLYETHYLENE GLYCOL 3350 17 G PO PACK: 17.0000 g | PACK | Freq: Every day | ORAL | Status: DC | PRN

## 2021-10-14 MED ORDER — PIPERACILLIN-TAZOBACTAM 3.375 G IVPB
3.3750 g | Freq: Three times a day (TID) | INTRAVENOUS | Status: DC
  Administered 2021-10-14 – 2021-10-18 (×11): 3.375 g via INTRAVENOUS
  Filled 2021-10-14 (×11): qty 50

## 2021-10-14 MED ORDER — IOHEXOL 9 MG/ML PO SOLN
500.0000 mL | ORAL | Status: DC
  Administered 2021-10-14: 500 mL via ORAL

## 2021-10-14 MED ORDER — IOHEXOL 9 MG/ML PO SOLN
500.0000 mL | ORAL | Status: AC
  Administered 2021-10-14: 500 mL

## 2021-10-14 MED ORDER — IOHEXOL 300 MG/ML  SOLN
100.0000 mL | Freq: Once | INTRAMUSCULAR | Status: AC | PRN
  Administered 2021-10-14: 100 mL via INTRAVENOUS

## 2021-10-14 NOTE — Progress Notes (Addendum)
Patient ID: ASHAR LEWINSKI, male   DOB: 1951-01-12, 71 y.o.   MRN: [id number]     [place] Hospital Day(s): 14.   Interval History: Patient seen and examined.  Patient continue critically ill, sedated on mechanical ventilation.  I was notified by ICU team that patient started having fevers with drainage from wound.  Upon chart review patient initially admitted due to bowel obstruction.  He was taken to the operating room for exploratory laparotomy and lysis of adhesions.  No sign of ischemic bowel.  He is postop course has been complicated by prolonged respiratory failure.  CT scan 2 days after surgery shows resolution of pneumatosis.  Patient was reintubated on 10/03/2021 due to agitation and delirium and prolonged ileus.  He was started on tube feeds.  He was having bowel movements.  Due to prolonged mechanical ventilation he had tracheostomy done on 09/23/2021.  Today the patient presented with drainage and redness on the upper portion of the midline wound.  Vital signs in last 24 hours: [min-max] current  Temp:  [98.9 F (37.2 C)-101.5 F (38.6 C)] 101.1 F (38.4 C) (08/26 0950) Pulse Rate:  [85-122] 117 (08/26 1100) Resp:  [15-34] 29 (08/26 1100) BP: (116-168)/(71-88) 147/81 (08/26 1100) SpO2:  [84 %-97 %] 94 % (08/26 1100) FiO2 (%):  [35 %] 35 % (08/26 0816) Weight:  [98.8 kg] 98.8 kg (08/26 0310)     Height: '5\' 11"'$  (180.3 cm) Weight: 98.8 kg BMI (Calculated): 30.39   Physical Exam:  Constitutional: Critically ill, sedated on mechanical ventilation Respiratory: Respiratory failure, mechanical ventilation Cardiovascular: Cardiac Gastrointestinal: Soft and depressible, distended, upper portion of the abdominal wound with cellulitis and purulent output.  I was able to open the incision on the upper portion and cleaned  Wound picture on media tab.   Labs:     Latest Ref Rng & Units 10/14/2021    4:22 AM 09/24/2021    4:52 AM 10/12/2021    4:12 AM  CBC  WBC 4.0 - 10.5  K/uL 9.8  8.8  8.3   Hemoglobin 13.0 - 17.0 g/dL 10.6  9.3  9.9   Hematocrit 39.0 - 52.0 % 34.3  30.4  33.5   Platelets 150 - 400 K/uL 216  191  213       Latest Ref Rng & Units 10/14/2021    4:22 AM 09/26/2021    4:52 AM 10/12/2021    4:12 AM  CMP  Glucose 70 - 99 mg/dL 347  286  179   BUN 8 - 23 mg/dL 54  50  62   Creatinine 0.61 - 1.24 mg/dL 1.38  1.45  1.51   Sodium 135 - 145 mmol/L 145  144  154   Potassium 3.5 - 5.1 mmol/L 3.8  3.4  4.1   Chloride 98 - 111 mmol/L 118  115  126   CO2 22 - 32 mmol/L '20  23  25   '$ Calcium 8.9 - 10.3 mg/dL 9.3  8.9  9.5     Imaging studies: No new pertinent imaging studies   Assessment/Plan:  71 y.o. male with SBO 14 days s/p exploratory laparotomy with lysis of additions, complicated by pertinent comorbidities including respiratory failure, mechanical ventilation.  -Patient again now presenting with fever, tachycardia -Drainage through the upper portion of the wound with erythema -I opened the wound in the upper portion with purulent drainage.  There was no further drainage from the deeper tissue. -Lower portion of the wound without  erythema, no drainage. -Superficial wound infection to explain his fever and tachycardia.  Now that this open and draining we can see if patient started to improve his fever and tachycardia.  If there is any further concern with recurrent fever and tachycardia I will suggest to proceed with CT scan of the abdomen and pelvis for evaluation of deeper intra-abdominal abscess.  -There is no leukocytosis.  Patient not on pressors support at this moment. -Continue critically ill, sedated on mechanical ventilation. -May continue to feedings.  Patient is having bowel movements. -I applied wet-to-dry dressings to the open portion of the wound -We will continue to follow.   Arnold Long, MD

## 2021-10-14 NOTE — Anesthesia Postprocedure Evaluation (Signed)
Anesthesia Post Note  Patient: Todd Spencer  Procedure(s) Performed: TRACHEOSTOMY  Patient location during evaluation: PACU Anesthesia Type: General Level of consciousness: awake and alert Pain management: pain level controlled Vital Signs Assessment: post-procedure vital signs reviewed and stable Respiratory status: spontaneous breathing, nonlabored ventilation, respiratory function stable and patient connected to nasal cannula oxygen Cardiovascular status: blood pressure returned to baseline and stable Postop Assessment: no apparent nausea or vomiting Anesthetic complications: no   No notable events documented.   Last Vitals:  Vitals:   10/14/21 1400 10/14/21 1500  BP: 138/70 (!) 146/89  Pulse: (!) 118 (!) 108  Resp: (!) 23 (!) 24  Temp:    SpO2: 95% 92%    Last Pain:  Vitals:   10/14/21 1151  TempSrc: Axillary  PainSc:                  Molli Barrows

## 2021-10-14 NOTE — Progress Notes (Signed)
NAME:  Todd Spencer, MRN:  [id number], DOB:  10/29/1950, LOS: 30 ADMISSION DATE:  10/06/2021, CONSULTATION DATE: 10/12/2021  History of Present Illness/SYNOPSIS  71 year old male with a history of CAD, ischemic cardiomyopathy, type 2 diabetes, HTN, CKD, and OSA who presented to the ED with abdominal pain, nausea and vomiting. CT scan of the abdomen was performed showing portal venous and mesenteric gas, highly concerning for mesenteric ischemia.  He was started on antibiotics and taken emergently to the OR by Dr. Christian Mate for an exploratory laparotomy. History was mostly obtained by chart review as the patient was transferred to the ICU intubated following his procedure.   Dr. Christian Mate noted multiple adhesions in the abdomen which were excised and lysed. A mild non-compromised transition area was noted, with the small bowel appearing healthy and well perfused, with no signs of discoloration pneumatosis or perforation. Similarly, the colon was healthy appearing. Following the procedure, he was transferred to the ICU for gradual extubation and further care.  Pertinent  Medical History  CAD s/p stenting to his RCA and LAD HFrEF, ischemic cardiomyopathy HTN HLD T2DM OSA on CPAP  Significant Hospital Events: Including procedures, antibiotic start and stop dates in addition to other pertinent events   10/09/2021: Exploratory laparotomy with lysis of adhesions, no       mesenteric ischemia noted 10/06/2021: Overnight pt with 450 ml of bloody OGT concerning       for possible GIB and worsening hyperkalemia  10/14/2021: Pt extubated to [place]  10/01/2021: CT Chest/Abd/Pelvis revealed Resolution of previously noted pneumatosis and portal venous gas portal venous gas. There are some persistent areas of small-bowel dilatation, with multiple ill-defined loops of small bowel and multiple air-fluid levels. This may simply represent a mild postoperative ileus, although persistent partial small bowel  obstruction is not entirely excluded. Trace volume of ascites. Dependent opacities in the lung bases bilaterally favored to predominantly reflect areas of subsegmental atelectasis and/or chronic scarring, although a small amount of airspace consolidation is not excluded in the posterior aspect of the right lower lobe. Aortic atherosclerosis, in addition to left main and three-vessel coronary artery disease. Assessment for potential risk factor modification, dietary therapy or pharmacologic therapy may be warranted, if clinically indicated. Additional incidental findings, as above. 10/02/2021: Pt developed worsening delirium/agitation overnight requiring precedex gtt.  Pt lethargic this am but able to follow commands with no signs of agitation.  Will attempt to wean off Bipap to HFNC once mentation improves.  Minimal NGT output overnight  10/03/2021- Trickle feeds initiated on 08/14 '@10'$  ml/hr.  However, overnight he pulled the NGT out.  This am he is having worsening abdominal distension and pain. KUB revealed ileus pattern with slightly progressing bowel gas pattern.  Orders placed to reinsert NGT once placement confirmed will place to LIS.  Intubated to allow time for ileus to resolve (see discussion 10/03/21 note) 8/18 remains on vent, ABD distended, failure to wean from vent 8/19 remains on vent, ABD distended, tolerated TF's 8/20 DID NOT TOLERATE TUBE FEEDS 8/21 severe hypoxia, possible mucus plugs 8/22 tolerating feeds family to come to bedside today 8/23 failure to wean from vent, ENT consulted for [facility] 8/25 [facility] 8/26 wound drainage  Interim History / Subjective:  Remains critically ill +ileus did NOT tolerate TFs  Prognosis is guarded Failure to wean from vent ENT consulted for [facility]      Latest Ref Rng & Units 10/14/2021    4:22 AM 10/08/2021    4:52 AM 10/12/2021  4:12 AM  BMP  Glucose 70 - 99 mg/dL 347  286  179   BUN 8 - 23 mg/dL 54  50  62   Creatinine 0.61 - 1.24 mg/dL 1.38   1.45  1.51   Sodium 135 - 145 mmol/L 145  144  154   Potassium 3.5 - 5.1 mmol/L 3.8  3.4  4.1   Chloride 98 - 111 mmol/L 118  115  126   CO2 22 - 32 mmol/L '20  23  25   '$ Calcium 8.9 - 10.3 mg/dL 9.3  8.9  9.5         Objective   Blood pressure (!) 146/89, pulse (!) 108, temperature 100.3 F (37.9 C), temperature source Axillary, resp. rate (!) 24, height '5\' 11"'$  (1.803 m), weight 98.8 kg, SpO2 92 %.    Vent Mode: PRVC FiO2 (%):  [35 %] 35 % Set Rate:  [14 bmp] 14 bmp Vt Set:  [550 mL] 550 mL PEEP:  [5 cmH20] 5 cmH20 Plateau Pressure:  [18 cmH20] 18 cmH20   Intake/Output Summary (Last 24 hours) at 10/14/2021 1534 Last data filed at 10/14/2021 1319 Gross per 24 hour  Intake 2726.73 ml  Output 3375 ml  Net -648.27 ml    Filed Weights   10/12/21 0310 09/20/2021 0500 10/14/21 0310  Weight: 100.8 kg 102.2 kg 98.8 kg    REVIEW OF SYSTEMS  PATIENT IS UNABLE TO PROVIDE COMPLETE REVIEW OF SYSTEMS DUE TO SEVERE CRITICAL ILLNESS    PHYSICAL EXAMINATION:  GENERAL:critically ill appearing, +resp distress EYES: Pupils equal, round, reactive to light.  No scleral icterus.  MOUTH: Moist mucosal membrane. INTUBATED NECK: Supple.  PULMONARY: +rhonchi, +wheezing CARDIOVASCULAR: S1 and S2.  No murmurs  GASTROINTESTINAL: laparotomy is CDI with staples and honeycomb; no erythema, no drainage+distended  MUSCULOSKELETAL: edema.  NEUROLOGIC: obtunded SKIN:intact,warm,dry    Assessment & Plan:   71 yo obese WM with  s/p exploratory laparotomy and LOA for CT findings concerning for SBO, pneumatosis, and pneumoperitoneum anticipated post-operative ileus 5 Days Post-Op associated with severe hypoxic/hypercapnic resp failure and failure to wean from vent    Severe ACUTE Hypoxic and Hypercapnic Respiratory Failure -continue Mechanical Ventilator support -Wean Fio2 and PEEP as tolerated -VAP/VENT bundle implementation - Wean PEEP & FiO2 as tolerated, maintain SpO2 > 88% - Head of bed  elevated 30 degrees, VAP protocol in place - Plateau pressures less than 30 cm H20  - Intermittent chest x-ray & ABG PRN - Ensure adequate pulmonary hygiene  ENT CONSULTED FOR TRACH, COMPLETED 8/25  Postoperative hypoxemic respiratory failure- due risk of aspiration (see discussion 10/03/21), decision made to keep on mechanical ventilation until return of bowel function-NOT TOLERATING TF's     ABDOMEN NOT TOLERATING TF's FAILURE TO WEAN FROM VENT ENT CONSULTED FOR [facility], completed SBO with small bowel ischemia- s/p ex lap LOA 8/12 Postoperative ileus- follow up GEN surg RECS Resume trickle feed and TPN Drainage from wound CT abd ordered with contrast    NEUROLOGY ACUTE METABOLIC ENCEPHALOPATHY WUA daily Still delirium Surveillance neuro-imaging via CT  ACUTE KIDNEY INJURY/Renal Failure -continue Foley Catheter-assess need -Avoid nephrotoxic agents -Follow urine output, BMP -Ensure adequate renal perfusion, optimize oxygenation -Renal dose medications -creatinine falling, trend    Intake/Output Summary (Last 24 hours) at 10/14/2021 1534 Last data filed at 10/14/2021 1319 Gross per 24 hour  Intake 2726.73 ml  Output 3375 ml  Net -648.27 ml         Latest Ref Rng & Units 10/14/2021  4:22 AM 09/22/2021    4:52 AM 10/12/2021    4:12 AM  BMP  Glucose 70 - 99 mg/dL 347  286  179   BUN 8 - 23 mg/dL 54  50  62   Creatinine 0.61 - 1.24 mg/dL 1.38  1.45  1.51   Sodium 135 - 145 mmol/L 145  144  154   Potassium 3.5 - 5.1 mmol/L 3.8  3.4  4.1   Chloride 98 - 111 mmol/L 118  115  126   CO2 22 - 32 mmol/L '20  23  25   '$ Calcium 8.9 - 10.3 mg/dL 9.3  8.9  9.5     ACUTE ANEMIA- TRANSFUSE AS NEEDED CONSIDER TRANSFUSION  IF HGB<7   ENDO - ICU hypoglycemic\Hyperglycemia protocol -check FSBS per protocol   GI GI PROPHYLAXIS as indicated  NUTRITIONAL STATUS DIET-->TF's on hold Constipation protocol as indicated   ELECTROLYTES -follow labs as needed -replace as  needed -pharmacy consultation and following   Diet/type: TPN DVT prophylaxis: prophylactic heparin  GI prophylaxis: PPI Lines: PICC Foley:  Yes, and it is still needed: keep for a few more days    DVT/GI PRX  assessed I Assessed the need for Labs I Assessed the need for Foley I Assessed the need for Central Venous Line Family Discussion when available I Assessed the need for Mobilization I made an Assessment of medications to be adjusted accordingly Safety Risk assessment completed     Critical Care Time devoted to patient care services described in this note is 45 minutes.

## 2021-10-14 NOTE — Inpatient Diabetes Management (Addendum)
Inpatient Diabetes Program Recommendations  AACE/ADA: New Consensus Statement on Inpatient Glycemic Control (2015)  Target Ranges:  Prepandial:   less than 140 mg/dL      Peak postprandial:   less than 180 mg/dL (1-2 hours)      Critically ill patients:  140 - 180 mg/dL    Latest Reference Range & Units 09/24/2021 11:34 09/22/2021 15:04 10/10/2021 19:17 10/10/2021 21:49 09/29/2021 23:47 10/14/21 02:06 10/14/21 03:34 10/14/21 04:37 10/14/21 05:28 10/14/21 06:30  Glucose-Capillary 70 - 99 mg/dL 289 (H) 320 (H) 342 (H) 371 (H) 307 (H) 377 (H)  IV Insulin Drip Started '@13'$  units/hr 371 (H)  13 units/hr 284 (H)  11.5 units/hr 346 (H)  18 units/hr 315 (H)  21 units/hr  (H): Data is abnormally high      Home DM Meds: Lantus 100 units QHS                              Metformin 1500 mg QPM                              Regular Insulin 50 units TID     Current Orders: IV Insulin Drip         TPN running 70cc/hr (No Insulin)   Tube feeds restarted at 12:23pm yest at 15cc/hr    Note pt placed back on IV Insulin Drip due to Hyperglycemia   Note Drip rates are high (greater than 8 units/hr)  Recommend pt remain on the IV Insulin Drip for at least 24 hours so we can try to determine what his insulin  needs are while getting the TPN.   We can watch how much Insulin he requires over the next day and then perhaps pharmacy could add insulin to the TPN and we could transition with appropriate SQ Insulin orders to maintain glucose control    --Will follow patient during hospitalization--  Wyn Quaker RN, MSN, [place] Diabetes Coordinator Inpatient Glycemic Control Team Team Pager: [phone] (8a-5p)

## 2021-10-14 NOTE — Consult Note (Signed)
PHARMACY - TOTAL PARENTERAL NUTRITION CONSULT NOTE   Indication: Prolonged ileus  Patient Measurements: Height: '5\' 11"'$  (180.3 cm) Weight: 98.8 kg (217 lb 13 oz) IBW/kg (Calculated) : 75.3   Body mass index is 30.38 kg/m.  Assessment:  Patient is a 71 y/o M with past medical history including CAD, ischemic cardiomyopathy, DM, HTN, CKD, OSA who presented to the ED 8/12 with abdominal pain, nausea, and vomiting. Imaging was concerning for mesenteric ischemia and patient was taken for emergent exploratory laparotomy with lysis of adhesions and no mesenteric ischemia noted. Patient was extubated 8/13. Post-operative course complicated by delirium, ongoing ileus. Given overall clinical picture, patient was re-intubated 8/15 and PICC was placed to start TPN 8/15. TPN was stopped briefly and is now being restarted  Glucose / Insulin: History of DM (last Hgb A1c 7.1%). BG last 24h: 234 - 377. Patient has been transitioned to an insulin gtt Electrolytes: Within normal limits Renal: Scr has been labile, maintaining 1.3 - 1.5 most recently Hepatic: No transaminitis. LFTs normal Intake / Output; MIVF: I&O: +765 mL GI Imaging: 8/12 CT abdomen / pelvis: Concerning for acute mesenteric ischemia. Extensive pneumatosis within several loops of small bowel within the central abdomen with mucosal hypoenhancement. Extensive mesenteric and portal venous gas. Small volume pneumoperitoneum.Multiple dilated loops of small bowel without abrupt transition point. It is uncertain if this is secondary to bowel ischemia or mechanical obstruction. 8/13 CT abdomen / pelvis: Resolution of previously noted pneumatosis and portal venous gas portal venous gas. There are some persistent areas of small-bowel dilatation, with multiple ill-defined loops of small bowel and multiple air-fluid levels. This may simply represent a mild postoperative ileus, although persistent partial small bowel obstruction is not entirely  excluded. 8/23 XR Abd: Nonobstructive bowel gas pattern, with nasogastric tube to the stomach GI Surgeries / Procedures:  8/12: ExLap with lysis of adhesions. No mesenteric ischemia found  Central access: 8/15 TPN start date: 8/15  Nutritional Goals: Goal TPN rate is 70 mL/hr (provides 150 g of protein and 1928 kcals per day)  RD Assessment: Estimated Needs Total Energy Estimated Needs: 2200-2500kcal/day Total Protein Estimated Needs: 150-160g/day Total Fluid Estimated Needs: 2.0L/day  Current Nutrition:  Tube feeding titrating to goal of 55 mL/hr Propofol at 12.83 mL/hr  Plan:  --Decrease TPN to 35 mL/hr (1/2 rate)  Nutritional components Protein (as 15% Clinisol): 75 grams Dextrose: 109.2 g Lipids (as 20% SMOFilpids): Hold given hypertriglyceridemia and on propofol kCal 670 KCal --Electrolytes in TPN: Na 472mq/L, K 52m/L, Ca 72m90mL, Mg 72mE20m, and Phos 172mm40m. Cl:Ac 1:1 --Add standard MVI and trace elements to TPN  --BG control: Continue with insulin gtt for today --Monitor TPN labs daily until stable  Todd Spencer Benita Gutter/2023,8:09 AM

## 2021-10-14 NOTE — Progress Notes (Addendum)
Upon initial assessment of patient at [id number], patient is tachycardic, tachypneic, and febrile at 101.5 axillary. PRN Tylenol suppository administered. Assessment of surgical incision reveals redness and warm to touch at upper portion of incision. Scant/small amount of serosanguinous drainage present.  At 0950 patient's temperature is 101.1 axillary. Surgical site assessment reveals moderate amount of thick, yellow drainage that is foul in smell. Dr. Peyton Najjar notified via secure chat.   Dr. Peyton Najjar to bedside to assess patient. He removed the staples at the upper portion of the incision and cleaned it with saline solution. He then placed a wet to dry saline dressing on top of the site and gave verbal order to change twice daily and as needed if soiled.  Verbal conversation with Dr. Peyton Najjar and Darel Hong, NP regarding above events.

## 2021-10-15 ENCOUNTER — Inpatient Hospital Stay: Payer: Medicare HMO

## 2021-10-15 DIAGNOSIS — K631 Perforation of intestine (nontraumatic): Secondary | ICD-10-CM

## 2021-10-15 LAB — HEMOGLOBIN AND HEMATOCRIT, BLOOD
HCT: 28.1 % — ABNORMAL LOW (ref 39.0–52.0)
HCT: 28.7 % — ABNORMAL LOW (ref 39.0–52.0)
HCT: 29.8 % — ABNORMAL LOW (ref 39.0–52.0)
HCT: 30 % — ABNORMAL LOW (ref 39.0–52.0)
Hemoglobin: 8.8 g/dL — ABNORMAL LOW (ref 13.0–17.0)
Hemoglobin: 8.9 g/dL — ABNORMAL LOW (ref 13.0–17.0)
Hemoglobin: 9.2 g/dL — ABNORMAL LOW (ref 13.0–17.0)
Hemoglobin: 9.4 g/dL — ABNORMAL LOW (ref 13.0–17.0)

## 2021-10-15 LAB — LACTIC ACID, PLASMA: Lactic Acid, Venous: 1.1 mmol/L (ref 0.5–1.9)

## 2021-10-15 LAB — PROTIME-INR
INR: 1.3 — ABNORMAL HIGH (ref 0.8–1.2)
Prothrombin Time: 15.9 seconds — ABNORMAL HIGH (ref 11.4–15.2)

## 2021-10-15 LAB — GLUCOSE, CAPILLARY
Glucose-Capillary: 128 mg/dL — ABNORMAL HIGH (ref 70–99)
Glucose-Capillary: 128 mg/dL — ABNORMAL HIGH (ref 70–99)
Glucose-Capillary: 131 mg/dL — ABNORMAL HIGH (ref 70–99)
Glucose-Capillary: 131 mg/dL — ABNORMAL HIGH (ref 70–99)
Glucose-Capillary: 134 mg/dL — ABNORMAL HIGH (ref 70–99)
Glucose-Capillary: 136 mg/dL — ABNORMAL HIGH (ref 70–99)
Glucose-Capillary: 138 mg/dL — ABNORMAL HIGH (ref 70–99)
Glucose-Capillary: 139 mg/dL — ABNORMAL HIGH (ref 70–99)
Glucose-Capillary: 140 mg/dL — ABNORMAL HIGH (ref 70–99)
Glucose-Capillary: 141 mg/dL — ABNORMAL HIGH (ref 70–99)
Glucose-Capillary: 141 mg/dL — ABNORMAL HIGH (ref 70–99)
Glucose-Capillary: 141 mg/dL — ABNORMAL HIGH (ref 70–99)
Glucose-Capillary: 141 mg/dL — ABNORMAL HIGH (ref 70–99)
Glucose-Capillary: 147 mg/dL — ABNORMAL HIGH (ref 70–99)
Glucose-Capillary: 147 mg/dL — ABNORMAL HIGH (ref 70–99)
Glucose-Capillary: 151 mg/dL — ABNORMAL HIGH (ref 70–99)
Glucose-Capillary: 151 mg/dL — ABNORMAL HIGH (ref 70–99)
Glucose-Capillary: 152 mg/dL — ABNORMAL HIGH (ref 70–99)
Glucose-Capillary: 162 mg/dL — ABNORMAL HIGH (ref 70–99)
Glucose-Capillary: 165 mg/dL — ABNORMAL HIGH (ref 70–99)

## 2021-10-15 LAB — CBC WITH DIFFERENTIAL/PLATELET
Abs Immature Granulocytes: 0.1 10*3/uL — ABNORMAL HIGH (ref 0.00–0.07)
Basophils Absolute: 0 10*3/uL (ref 0.0–0.1)
Basophils Relative: 0 %
Eosinophils Absolute: 0.1 10*3/uL (ref 0.0–0.5)
Eosinophils Relative: 1 %
HCT: 31.2 % — ABNORMAL LOW (ref 39.0–52.0)
Hemoglobin: 9.9 g/dL — ABNORMAL LOW (ref 13.0–17.0)
Immature Granulocytes: 1 %
Lymphocytes Relative: 6 %
Lymphs Abs: 0.7 10*3/uL (ref 0.7–4.0)
MCH: 29 pg (ref 26.0–34.0)
MCHC: 31.7 g/dL (ref 30.0–36.0)
MCV: 91.5 fL (ref 80.0–100.0)
Monocytes Absolute: 1.1 10*3/uL — ABNORMAL HIGH (ref 0.1–1.0)
Monocytes Relative: 9 %
Neutro Abs: 9.4 10*3/uL — ABNORMAL HIGH (ref 1.7–7.7)
Neutrophils Relative %: 83 %
Platelets: 224 10*3/uL (ref 150–400)
RBC: 3.41 MIL/uL — ABNORMAL LOW (ref 4.22–5.81)
RDW: 15.4 % (ref 11.5–15.5)
WBC: 11.4 10*3/uL — ABNORMAL HIGH (ref 4.0–10.5)
nRBC: 0 % (ref 0.0–0.2)

## 2021-10-15 LAB — TYPE AND SCREEN
ABO/RH(D): A POS
Antibody Screen: NEGATIVE

## 2021-10-15 LAB — BASIC METABOLIC PANEL
Anion gap: 5 (ref 5–15)
BUN: 45 mg/dL — ABNORMAL HIGH (ref 8–23)
CO2: 22 mmol/L (ref 22–32)
Calcium: 9.2 mg/dL (ref 8.9–10.3)
Chloride: 121 mmol/L — ABNORMAL HIGH (ref 98–111)
Creatinine, Ser: 1.1 mg/dL (ref 0.61–1.24)
GFR, Estimated: >60 mL/min (ref >60)
Glucose, Bld: 141 mg/dL — ABNORMAL HIGH (ref 70–99)
Potassium: 3.4 mmol/L — ABNORMAL LOW (ref 3.5–5.1)
Sodium: 148 mmol/L — ABNORMAL HIGH (ref 135–145)

## 2021-10-15 LAB — POTASSIUM: Potassium: 5 mmol/L (ref 3.5–5.1)

## 2021-10-15 LAB — PHOSPHORUS: Phosphorus: 2.7 mg/dL (ref 2.5–4.6)

## 2021-10-15 LAB — MAGNESIUM: Magnesium: 2.2 mg/dL (ref 1.7–2.4)

## 2021-10-15 MED ORDER — PANTOPRAZOLE SODIUM 40 MG IV SOLR: 40.0000 mg | Freq: Two times a day (BID) | INTRAVENOUS | Status: DC

## 2021-10-15 MED ORDER — TRACE MINERALS CU-MN-SE-ZN 300-55-60-3000 MCG/ML IV SOLN
INTRAVENOUS | Status: AC
  Filled 2021-10-15: qty 996.8

## 2021-10-15 MED ORDER — POTASSIUM CHLORIDE 20 MEQ PO PACK
40.0000 meq | PACK | Freq: Once | ORAL | Status: AC
Start: 2021-10-15 — End: 2021-10-15
  Administered 2021-10-15: 40 meq
  Filled 2021-10-15: qty 2

## 2021-10-15 MED ORDER — SODIUM BICARBONATE 8.4 % IV SOLN
INTRAVENOUS | Status: DC
  Filled 2021-10-15: qty 1000

## 2021-10-15 MED ORDER — MIDAZOLAM HCL 2 MG/2ML IJ SOLN
1.0000 mg | INTRAMUSCULAR | Status: DC | PRN
  Administered 2021-10-15 – 2021-10-18 (×2): 2 mg via INTRAVENOUS
  Filled 2021-10-15 (×2): qty 2

## 2021-10-15 MED ORDER — PANTOPRAZOLE INFUSION (NEW) - SIMPLE MED
8.0000 mg/h | INTRAVENOUS | Status: DC
  Administered 2021-10-15 – 2021-10-16 (×5): 8 mg/h via INTRAVENOUS
  Filled 2021-10-15 (×4): qty 100

## 2021-10-15 MED ORDER — IOHEXOL 350 MG/ML SOLN
100.0000 mL | Freq: Once | INTRAVENOUS | Status: AC | PRN
  Administered 2021-10-15: 100 mL via INTRAVENOUS

## 2021-10-15 MED ORDER — MIDAZOLAM HCL 2 MG/2ML IJ SOLN
2.0000 mg | Freq: Once | INTRAMUSCULAR | Status: AC
  Administered 2021-10-15: 2 mg via INTRAVENOUS
  Filled 2021-10-15: qty 2

## 2021-10-15 MED ORDER — POTASSIUM CHLORIDE 20 MEQ PO PACK
40.0000 meq | PACK | Freq: Once | ORAL | Status: AC
  Administered 2021-10-15: 40 meq
  Filled 2021-10-15: qty 2

## 2021-10-15 MED ORDER — SODIUM BICARBONATE 8.4 % IV SOLN
50.0000 meq | Freq: Once | INTRAVENOUS | Status: AC
  Administered 2021-10-15: 50 meq via INTRAVENOUS
  Filled 2021-10-15: qty 50

## 2021-10-15 MED ORDER — PANTOPRAZOLE SODIUM 40 MG IV SOLR
40.0000 mg | Freq: Once | INTRAVENOUS | Status: AC
  Administered 2021-10-15: 40 mg via INTRAVENOUS
  Filled 2021-10-15: qty 10

## 2021-10-15 MED ORDER — STERILE WATER FOR INJECTION IV SOLN
INTRAVENOUS | Status: DC
  Filled 2021-10-15: qty 150

## 2021-10-15 NOTE — Inpatient Diabetes Management (Signed)
Inpatient Diabetes Program Recommendations  AACE/ADA: New Consensus Statement on Inpatient Glycemic Control (2015)  Target Ranges:  Prepandial:   less than 140 mg/dL      Peak postprandial:   less than 180 mg/dL (1-2 hours)      Critically ill patients:  140 - 180 mg/dL    Latest Reference Range & Units 10/14/21 20:13 10/14/21 21:15 10/14/21 22:09 10/14/21 23:06 10/15/21 00:51 10/15/21 01:56 10/15/21 03:19 10/15/21 04:21 10/15/21 05:24 10/15/21 07:28  Glucose-Capillary 70 - 99 mg/dL 139 (H)  8 units/hr 160 (H)  12 units/hr 157 (H)  12 units/hr 145 (H)  9.5 units/hr 128 (H)  6.5 units/hr 136 (H)  8 units/hr 147 (H)  9.5 units/hr 141 (H)  8.8 units/hr 152 (H)  10.5 units/hr 131 (H)  6 units/hr  (H): Data is abnormally high     Home DM Meds: Lantus 100 units QHS                              Metformin 1500 mg QPM                              Regular Insulin 50 units TID     Current Orders: IV Insulin Drip         TPN running 35cc/hr (No Insulin)   Tube feeds restarted at 12:23pm yest at 15cc/hr, However, Tube Feeds have now been stopped   MD- Given new issues with suspected GIB and the fact that pt's IV Insulin Drip rates are still mostly >8 units/hr since Midnight, it may be best to leave pt on the IV Insulin Drip for another 24 hours  If plan is to leave pt on the TPN for now, we could try adding a small amount of insulin to the TPN (for tonight) to try to bring the IV Insulin Drip rates down in an effort to transition to SQ Insulin tomorrow 08/28.  Pt has received total of 90.8 units IV Insulin since 8pm last night (last 10 hours).  We could try adding 25% of that total (22 units) to the TPN solution for tonight's bag and continue the IV Insulin drip today in hopes that pt's IV Insulin Drip rates will slowly drop to less than 8 unit/hr by tomorrow AM.    --Will follow patient during hospitalization--  Wyn Quaker RN, MSN, [facility]  Coordinator Inpatient Glycemic Control Team Team Pager: [phone] (8a-5p)

## 2021-10-15 NOTE — Consult Note (Addendum)
PHARMACY - TOTAL PARENTERAL NUTRITION CONSULT NOTE   Indication: Prolonged ileus  Patient Measurements: Height: '5\' 11"'$  (180.3 cm) Weight: 99 kg (218 lb 4.1 oz) IBW/kg (Calculated) : 75.3   Body mass index is 30.44 kg/m.  Assessment:  Patient is a 71 y/o M with past medical history including CAD, ischemic cardiomyopathy, DM, HTN, CKD, OSA who presented to the ED 8/12 with abdominal pain, nausea, and vomiting. Imaging was concerning for mesenteric ischemia and patient was taken for emergent exploratory laparotomy with lysis of adhesions and no mesenteric ischemia noted. Patient was extubated 8/13. Post-operative course complicated by delirium, ongoing ileus. Given overall clinical picture, patient was re-intubated 8/15 and PICC was placed to start TPN 8/15. TPN was stopped briefly and is now being restarted  Glucose / Insulin: History of DM (last Hgb A1c 7.1%). BG last 24h: 234 - 377 > 141. Patient has been transitioned to an insulin gtt.  Electrolytes: Within normal limits Renal: Scr has been labile, maintaining 1.3 - 1.5 most recently Hepatic: No transaminitis. LFTs normal Intake / Output; MIVF: I&O: +765 mL GI Imaging: 8/12 CT abdomen / pelvis: Concerning for acute mesenteric ischemia. Extensive pneumatosis within several loops of small bowel within the central abdomen with mucosal hypoenhancement. Extensive mesenteric and portal venous gas. Small volume pneumoperitoneum.Multiple dilated loops of small bowel without abrupt transition point. It is uncertain if this is secondary to bowel ischemia or mechanical obstruction. 8/13 CT abdomen / pelvis: Resolution of previously noted pneumatosis and portal venous gas portal venous gas. There are some persistent areas of small-bowel dilatation, with multiple ill-defined loops of small bowel and multiple air-fluid levels. This may simply represent a mild postoperative ileus, although persistent partial small bowel obstruction is not entirely  excluded. 8/23 XR Abd: Nonobstructive bowel gas pattern, with nasogastric tube to the stomach GI Surgeries / Procedures:  8/12: ExLap with lysis of adhesions. No mesenteric ischemia found  Central access: 8/15 TPN start date: 8/15  Nutritional Goals: Goal TPN rate is 70 mL/hr (provides 150 g of protein and 1928 kcals per day)  RD Assessment: Estimated Needs Total Energy Estimated Needs: 2200-2500kcal/day Total Protein Estimated Needs: 150-160g/day Total Fluid Estimated Needs: 2.0L/day  Current Nutrition:  Tube feeding titrating to goal of 55 mL/hr. tube feeds were stopped r/t possible fistula/perf.   Propofol at 12.83 mL/hr - stopped.   Plan:  Going back to goal rate TPN at 70 mL/hr as tube feeds stopped.  Nutritional components Protein (as 15% Clinisol): 149.5 grams Dextrose: 13% Lipids (as 20% SMOFilpids): 58.8 grams kCal 1928 /24h --Electrolytes in TPN: Na 712mq/L, K 523m/L, Ca 12m28mL, Mg 12mE41m, and Phos 112mm312m. Cl:Ac 1:1 --Add standard MVI and trace elements to TPN  --BG control: Continue with insulin gtt and added 20 units insulin to TPN in hopes to get insulin infusion stopped.  - adding lipids back to TPN as propofol stopped. TG level to be obtain tomorrow.  --Monitor TPN labs daily until stable  [employer]/2023,9:51 AM

## 2021-10-15 NOTE — Progress Notes (Addendum)
Acute Suspected Lower Gastrointestinal Bleed  Called bedside by nursing, moderate amount of hematochezia noted. Reviewed CT abdomen/pelvis imaging from earlier on 8/26 which described diverticulosis. - Lovenox discontinued - additional 40 mg Protonix bolus ordered as patient has been getting 40 mg PPI BID followed by infusion - STAT type & screen, H&H, PT/INR - continuous cardiac monitoring - continue NPO - Monitor for s/s of bleeding - Daily CBC, PT/ INR monitoring PRN - Transfuse for Hgb <7 - CT angio ordered STAT - sodium bicarb bolus & drip ordered, after discussion with pharmacy regarding TPN precautions, in order to prevent CIN. Will pause TPN during sodium bicarb infusion, and restart at increased amount to catch the patient up subsequently - consider GI vs vascular/IR consult depending on CTa results  Addendum: Previous CT abdomen/pelvis was WITH contrast at 17:00 on 8/26, patient's Hgb is stable without any additional episodes of bleeding. Will hold off on additional IV contrast at this time. If patient has more active bleeding, can reconsider additional imaging with reduced dose of contrast. Plan of care discussed with Dr. Burnard Leigh at [facility] who is in agreement.   Domingo Pulse [place], AGACNP-BC Acute Care Nurse Practitioner [facility]   [phone] / [phone] Please see Amion for pager details.

## 2021-10-15 NOTE — Progress Notes (Signed)
RT assisted with patient transfer from ICU to CT and back with no complications. Patient was transported on the trilogy.

## 2021-10-15 NOTE — Progress Notes (Signed)
Patient ID: Todd Spencer, male   DOB: 08-30-1950, 71 y.o.   MRN: [id number]     [place] Day(s): 15.   Interval History: Patient seen and examined, no acute events or new complaints overnight.  Patient continues to be critically ill, on mechanical ventilation.  Patient today was more alert.  There has been no increasing output from the colocutaneous fistula.  There was some blood appreciated on the NGT, fistula output and the Flexi-Seal output.  Vital signs in last 24 hours: [min-max] current  Temp:  [98.6 F (37 C)-101.1 F (38.4 C)] 100.1 F (37.8 C) (08/27 0733) Pulse Rate:  [99-121] 110 (08/27 0800) Resp:  [12-32] 13 (08/27 0800) BP: (116-158)/(64-90) 121/77 (08/27 0800) SpO2:  [84 %-99 %] 96 % (08/27 0804) FiO2 (%):  [28 %-35 %] 28 % (08/27 0804) Weight:  [99 kg] 99 kg (08/27 0333)     Height: '5\' 11"'$  (180.3 cm) Weight: 99 kg BMI (Calculated): 30.45   Physical Exam:  Constitutional: alert, cooperative and no distress  Respiratory: breathing non-labored at rest  Cardiovascular: regular rate and sinus rhythm  Gastrointestinal: soft, non-tender, and non-distended.  Wound partially open in the upper portion.  There is small amount of melena.  Fistula coming from the very top area of the wound.  Labs:     Latest Ref Rng & Units 10/15/2021    5:08 AM 10/15/2021    1:13 AM 10/14/2021    4:22 AM  CBC  WBC 4.0 - 10.5 K/uL  11.4  9.8   Hemoglobin 13.0 - 17.0 g/dL 9.2  9.9  10.6   Hematocrit 39.0 - 52.0 % 30.0  31.2  34.3   Platelets 150 - 400 K/uL  224  216       Latest Ref Rng & Units 10/15/2021    1:13 AM 10/14/2021    4:22 AM 10/17/2021    4:52 AM  CMP  Glucose 70 - 99 mg/dL 141  347  286   BUN 8 - 23 mg/dL 45  54  50   Creatinine 0.61 - 1.24 mg/dL 1.10  1.38  1.45   Sodium 135 - 145 mmol/L 148  145  144   Potassium 3.5 - 5.1 mmol/L 3.4  3.8  3.4   Chloride 98 - 111 mmol/L 121  118  115   CO2 22 - 32 mmol/L '22  20  23   '$ Calcium 8.9 - 10.3 mg/dL 9.2   9.3  8.9     Imaging studies: I personally evaluated the images of the CT scan done yesterday.  There is significant inflammation in the mid transverse colon with suspected bubble of air outside of the lumen and contrast.  This is consistent with clinical output to the wound with colocutaneous fistula.   Assessment/Plan:  71 y.o. male with SBO 15 days s/p exploratory laparotomy with lysis of additions, complicated by pertinent comorbidities including respiratory failure, mechanical ventilation, colovaginal fistula.   -Patient again now presenting with fever, tachycardia.  Temp and heart rate has been slowly improving after opening of the wound.  Patient with status fistula confirmed.   -This seems to be low output.   -He has been controlled with wet-to-dry dressings with minimal changes.  Continue with changes at least twice per day and as needed with wet-to-dry dressings. -Agree with Zosyn for antibiotic therapy since there was a slight increasing white blood cell count. -We will hold enteral nutrition for few days and will reassess  if fistula output before restarting enteral feedings. -Continue TPN  GI bleeding -Decrease melena from the fistula output as well on the Flexi-Seal.  I agree with Protonix drip.  Hemoglobin stable.  We will continue to follow closely.  Arnold Long, MD
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
## 2021-10-15 NOTE — Progress Notes (Signed)
NAME:  Todd Spencer, MRN:  [id number], DOB:  1951-02-14, LOS: 4 ADMISSION DATE:  10/11/2021, CONSULTATION DATE: 10/09/2021  History of Present Illness/SYNOPSIS  71 year old male with a history of CAD, ischemic cardiomyopathy, type 2 diabetes, HTN, CKD, and OSA who presented to the ED with abdominal pain, nausea and vomiting. CT scan of the abdomen was performed showing portal venous and mesenteric gas, highly concerning for mesenteric ischemia.  He was started on antibiotics and taken emergently to the OR by Dr. Christian Mate for an exploratory laparotomy. History was mostly obtained by chart review as the patient was transferred to the ICU intubated following his procedure.   Dr. Christian Mate noted multiple adhesions in the abdomen which were excised and lysed. A mild non-compromised transition area was noted, with the small bowel appearing healthy and well perfused, with no signs of discoloration pneumatosis or perforation. Similarly, the colon was healthy appearing. Following the procedure, he was transferred to the ICU for gradual extubation and further care.  Pertinent  Medical History  CAD s/p stenting to his RCA and LAD HFrEF, ischemic cardiomyopathy HTN HLD T2DM OSA on CPAP  Significant Hospital Events: Including procedures, antibiotic start and stop dates in addition to other pertinent events   10/03/2021: Exploratory laparotomy with lysis of adhesions, no       mesenteric ischemia noted 10/19/2021: Overnight pt with 450 ml of bloody OGT concerning       for possible GIB and worsening hyperkalemia  10/16/2021: Pt extubated to [facility]  10/01/2021: CT Chest/Abd/Pelvis revealed Resolution of previously noted pneumatosis and portal venous gas portal venous gas. There are some persistent areas of small-bowel dilatation, with multiple ill-defined loops of small bowel and multiple air-fluid levels. This may simply represent a mild postoperative ileus, although persistent partial small bowel  obstruction is not entirely excluded. Trace volume of ascites. Dependent opacities in the lung bases bilaterally favored to predominantly reflect areas of subsegmental atelectasis and/or chronic scarring, although a small amount of airspace consolidation is not excluded in the posterior aspect of the right lower lobe. Aortic atherosclerosis, in addition to left main and three-vessel coronary artery disease. Assessment for potential risk factor modification, dietary therapy or pharmacologic therapy may be warranted, if clinically indicated. Additional incidental findings, as above. 10/02/2021: Pt developed worsening delirium/agitation overnight requiring precedex gtt.  Pt lethargic this am but able to follow commands with no signs of agitation.  Will attempt to wean off Bipap to HFNC once mentation improves.  Minimal NGT output overnight  10/03/2021- Trickle feeds initiated on 08/14 '@10'$  ml/hr.  However, overnight he pulled the NGT out.  This am he is having worsening abdominal distension and pain. KUB revealed ileus pattern with slightly progressing bowel gas pattern.  Orders placed to reinsert NGT once placement confirmed will place to LIS.  Intubated to allow time for ileus to resolve (see discussion 10/03/21 note) 8/18 remains on vent, ABD distended, failure to wean from vent 8/19 remains on vent, ABD distended, tolerated TF's 8/20 DID NOT TOLERATE TUBE FEEDS 8/21 severe hypoxia, possible mucus plugs 8/22 tolerating feeds family to come to bedside today 8/23 failure to wean from vent, ENT consulted for [facility] 8/25 [facility] 8/26 wound drainage, CT w/ small fistula  Interim History / Subjective:  Remains critically ill +ileus did NOT tolerate TFs  Prognosis is guarded Failure to wean from vent ENT consulted for [facility]      Latest Ref Rng & Units 10/15/2021    1:13 AM 10/14/2021  4:22 AM 10/01/2021    4:52 AM  BMP  Glucose 70 - 99 mg/dL 141  347  286   BUN 8 - 23 mg/dL 45  54  50   Creatinine 0.61  - 1.24 mg/dL 1.10  1.38  1.45   Sodium 135 - 145 mmol/L 148  145  144   Potassium 3.5 - 5.1 mmol/L 3.4  3.8  3.4   Chloride 98 - 111 mmol/L 121  118  115   CO2 22 - 32 mmol/L '22  20  23   '$ Calcium 8.9 - 10.3 mg/dL 9.2  9.3  8.9         Objective   Blood pressure 131/81, pulse 99, temperature 100.1 F (37.8 C), temperature source Axillary, resp. rate 20, height '5\' 11"'$  (1.803 m), weight 99 kg, SpO2 96 %.    Vent Mode: PRVC FiO2 (%):  [28 %-35 %] 28 % Set Rate:  [14 bmp-16 bmp] 14 bmp Vt Set:  [550 mL] 550 mL PEEP:  [5 cmH20] 5 cmH20 Plateau Pressure:  [12 cmH20] 12 cmH20   Intake/Output Summary (Last 24 hours) at 10/15/2021 1349 Last data filed at 10/15/2021 1331 Gross per 24 hour  Intake 1673.29 ml  Output 2720 ml  Net -1046.71 ml    Filed Weights   09/25/2021 0500 10/14/21 0310 10/15/21 0333  Weight: 102.2 kg 98.8 kg 99 kg    REVIEW OF SYSTEMS  PATIENT IS UNABLE TO PROVIDE COMPLETE REVIEW OF SYSTEMS DUE TO SEVERE CRITICAL ILLNESS    PHYSICAL EXAMINATION:  GENERAL:critically ill appearing, +resp distress EYES: Pupils equal, round, reactive to light.  No scleral icterus.  MOUTH: Moist mucosal membrane. INTUBATED NECK: Supple.  PULMONARY: +rhonchi, +wheezing CARDIOVASCULAR: S1 and S2.  No murmurs  GASTROINTESTINAL: laparotomy is CDI with staples and honeycomb; no erythema, no drainage+distended  MUSCULOSKELETAL: edema.  NEUROLOGIC: obtunded SKIN:intact,warm,dry    Assessment & Plan:   71 yo obese WM with  s/p exploratory laparotomy and LOA for CT findings concerning for SBO, pneumatosis, and pneumoperitoneum anticipated post-operative ileus 5 Days Post-Op associated with severe hypoxic/hypercapnic resp failure and failure to wean from vent    Severe ACUTE Hypoxic and Hypercapnic Respiratory Failure -continue Mechanical Ventilator support -Wean Fio2 and PEEP as tolerated -VAP/VENT bundle implementation - Wean PEEP & FiO2 as tolerated, maintain SpO2 > 88% -  Head of bed elevated 30 degrees, VAP protocol in place - Plateau pressures less than 30 cm H20  - Intermittent chest x-ray & ABG PRN - Ensure adequate pulmonary hygiene  ENT CONSULTED FOR TRACH, COMPLETED 8/25 Trial PSV today  Postoperative hypoxemic respiratory failure- due risk of aspiration (see discussion 10/03/21), decision made to keep on mechanical ventilation until return of bowel function-NOT TOLERATING TF's     ABDOMEN NOT TOLERATING TF's FAILURE TO WEAN FROM VENT ENT CONSULTED FOR [facility], completed SBO with small bowel ischemia- s/p ex lap LOA 8/12 Postoperative ileus- follow up GEN surg RECS Maintain  TPN Drainage from wound CT abd: "Suspected injury/microperforation of the mid transverse colon in the anterior abdomen, immediately beneath the surgical incision. Associated tiny foci of gas and possible extraluminal contrast, as described above. No associated drainable collection/abscess. No gross free air. Surgical consultation is suggested." Zosyn started 8/26 Conservative treatment planned at this time  GIB, GI eval CTA abd/pelvis to start  NEUROLOGY ACUTE METABOLIC ENCEPHALOPATHY WUA daily Still delirium Surveillance neuro-imaging via CT  ACUTE KIDNEY INJURY/Renal Failure Lab Results  Component Value Date   CREATININE 1.10 10/15/2021  BUN 45 (H) 10/15/2021   NA 148 (H) 10/15/2021   K 3.4 (L) 10/15/2021   CL 121 (H) 10/15/2021   CO2 22 10/15/2021    Intake/Output Summary (Last 24 hours) at 10/15/2021 1551 Last data filed at 10/15/2021 1537 Gross per 24 hour  Intake 1716.57 ml  Output 2445 ml  Net -728.43 ml   Net IO Since Admission: 125.64 mL [10/15/21 1551]  -continue Foley Catheter-assess need -Avoid nephrotoxic agents -Follow urine output, BMP -Ensure adequate renal perfusion, optimize oxygenation -Renal dose medications -creatinine falling, trend, now WNL -maintain neg balance -TPN adjustment for hyperNa  ACUTE ANEMIA- TRANSFUSE AS  NEEDED CONSIDER TRANSFUSION  IF HGB<7   ENDO - ICU hypoglycemic\Hyperglycemia protocol -check FSBS per protocol   GI GI PROPHYLAXIS as indicated  NUTRITIONAL STATUS DIET-->TF's on hold Constipation protocol as indicated   ELECTROLYTES -follow labs as needed -replace as needed -pharmacy consultation and following   Diet/type: TPN DVT prophylaxis: prophylactic heparin  GI prophylaxis: PPI Lines: PICC Foley:  Yes, and it is still needed: keep for a few more days    DVT/GI PRX  assessed I Assessed the need for Labs I Assessed the need for Foley I Assessed the need for Central Venous Line Family Discussion when available I Assessed the need for Mobilization I made an Assessment of medications to be adjusted accordingly Safety Risk assessment completed     Critical Care Time devoted to patient care services described in this note is 45 minutes.

## 2021-10-16 ENCOUNTER — Encounter: Payer: Self-pay | Admitting: Otolaryngology

## 2021-10-16 DIAGNOSIS — K55059 Acute (reversible) ischemia of intestine, part and extent unspecified: Secondary | ICD-10-CM | POA: Diagnosis not present

## 2021-10-16 DIAGNOSIS — K559 Vascular disorder of intestine, unspecified: Secondary | ICD-10-CM | POA: Diagnosis not present

## 2021-10-16 DIAGNOSIS — J9601 Acute respiratory failure with hypoxia: Secondary | ICD-10-CM | POA: Diagnosis not present

## 2021-10-16 DIAGNOSIS — R109 Unspecified abdominal pain: Secondary | ICD-10-CM | POA: Diagnosis not present

## 2021-10-16 LAB — GLUCOSE, CAPILLARY
Glucose-Capillary: 147 mg/dL — ABNORMAL HIGH (ref 70–99)
Glucose-Capillary: 156 mg/dL — ABNORMAL HIGH (ref 70–99)
Glucose-Capillary: 163 mg/dL — ABNORMAL HIGH (ref 70–99)
Glucose-Capillary: 163 mg/dL — ABNORMAL HIGH (ref 70–99)
Glucose-Capillary: 171 mg/dL — ABNORMAL HIGH (ref 70–99)
Glucose-Capillary: 172 mg/dL — ABNORMAL HIGH (ref 70–99)
Glucose-Capillary: 172 mg/dL — ABNORMAL HIGH (ref 70–99)
Glucose-Capillary: 177 mg/dL — ABNORMAL HIGH (ref 70–99)
Glucose-Capillary: 184 mg/dL — ABNORMAL HIGH (ref 70–99)
Glucose-Capillary: 192 mg/dL — ABNORMAL HIGH (ref 70–99)
Glucose-Capillary: 193 mg/dL — ABNORMAL HIGH (ref 70–99)
Glucose-Capillary: 194 mg/dL — ABNORMAL HIGH (ref 70–99)
Glucose-Capillary: 204 mg/dL — ABNORMAL HIGH (ref 70–99)
Glucose-Capillary: 210 mg/dL — ABNORMAL HIGH (ref 70–99)
Glucose-Capillary: 243 mg/dL — ABNORMAL HIGH (ref 70–99)
Glucose-Capillary: 248 mg/dL — ABNORMAL HIGH (ref 70–99)
Glucose-Capillary: 254 mg/dL — ABNORMAL HIGH (ref 70–99)
Glucose-Capillary: 255 mg/dL — ABNORMAL HIGH (ref 70–99)
Glucose-Capillary: 266 mg/dL — ABNORMAL HIGH (ref 70–99)

## 2021-10-16 LAB — HEMOGLOBIN AND HEMATOCRIT, BLOOD
HCT: 28.6 % — ABNORMAL LOW (ref 39.0–52.0)
HCT: 27.9 % — ABNORMAL LOW (ref 39.0–52.0)
Hemoglobin: 8.9 g/dL — ABNORMAL LOW (ref 13.0–17.0)
Hemoglobin: 8.7 g/dL — ABNORMAL LOW (ref 13.0–17.0)

## 2021-10-16 LAB — COMPREHENSIVE METABOLIC PANEL
ALT: 74 U/L — ABNORMAL HIGH (ref 0–44)
AST: 68 U/L — ABNORMAL HIGH (ref 15–41)
Albumin: 2.1 g/dL — ABNORMAL LOW (ref 3.5–5.0)
Alkaline Phosphatase: 54 U/L (ref 38–126)
Anion gap: 7 (ref 5–15)
BUN: 48 mg/dL — ABNORMAL HIGH (ref 8–23)
CO2: 25 mmol/L (ref 22–32)
Calcium: 9 mg/dL (ref 8.9–10.3)
Chloride: 119 mmol/L — ABNORMAL HIGH (ref 98–111)
Creatinine, Ser: 1.09 mg/dL (ref 0.61–1.24)
GFR, Estimated: >60 mL/min (ref >60)
Glucose, Bld: 175 mg/dL — ABNORMAL HIGH (ref 70–99)
Potassium: 3.3 mmol/L — ABNORMAL LOW (ref 3.5–5.1)
Sodium: 151 mmol/L — ABNORMAL HIGH (ref 135–145)
Total Bilirubin: 0.7 mg/dL (ref 0.3–1.2)
Total Protein: 6.5 g/dL (ref 6.5–8.1)

## 2021-10-16 LAB — CBC WITH DIFFERENTIAL/PLATELET
Abs Immature Granulocytes: 0.1 10*3/uL — ABNORMAL HIGH (ref 0.00–0.07)
Basophils Absolute: 0.1 10*3/uL (ref 0.0–0.1)
Basophils Relative: 1 %
Eosinophils Absolute: 0.1 10*3/uL (ref 0.0–0.5)
Eosinophils Relative: 1 %
HCT: 27.7 % — ABNORMAL LOW (ref 39.0–52.0)
Hemoglobin: 8.9 g/dL — ABNORMAL LOW (ref 13.0–17.0)
Immature Granulocytes: 1 %
Lymphocytes Relative: 7 %
Lymphs Abs: 0.6 10*3/uL — ABNORMAL LOW (ref 0.7–4.0)
MCH: 29.5 pg (ref 26.0–34.0)
MCHC: 32.1 g/dL (ref 30.0–36.0)
MCV: 91.7 fL (ref 80.0–100.0)
Monocytes Absolute: 0.7 10*3/uL (ref 0.1–1.0)
Monocytes Relative: 8 %
Neutro Abs: 7.3 10*3/uL (ref 1.7–7.7)
Neutrophils Relative %: 82 %
Platelets: 203 10*3/uL (ref 150–400)
RBC: 3.02 MIL/uL — ABNORMAL LOW (ref 4.22–5.81)
RDW: 15.6 % — ABNORMAL HIGH (ref 11.5–15.5)
WBC: 8.9 10*3/uL (ref 4.0–10.5)
nRBC: 0 % (ref 0.0–0.2)

## 2021-10-16 LAB — MAGNESIUM: Magnesium: 2.1 mg/dL (ref 1.7–2.4)

## 2021-10-16 LAB — PHOSPHORUS
Phosphorus: 2.3 mg/dL — ABNORMAL LOW (ref 2.5–4.6)
Phosphorus: 2.8 mg/dL (ref 2.5–4.6)

## 2021-10-16 LAB — POTASSIUM: Potassium: 3.4 mmol/L — ABNORMAL LOW (ref 3.5–5.1)

## 2021-10-16 MED ORDER — DEXTROSE 5 % IV SOLN
INTRAVENOUS | Status: DC
  Administered 2021-10-16: 75 mL/h via INTRAVENOUS

## 2021-10-16 MED ORDER — PANTOPRAZOLE SODIUM 40 MG IV SOLR
40.0000 mg | Freq: Two times a day (BID) | INTRAVENOUS | Status: DC
  Administered 2021-10-16 – 2021-10-17 (×4): 40 mg via INTRAVENOUS
  Filled 2021-10-16 (×3): qty 10

## 2021-10-16 MED ORDER — VITAL 1.5 CAL PO LIQD
1000.0000 mL | ORAL | Status: DC
  Administered 2021-10-16 – 2021-10-17 (×2): 1000 mL

## 2021-10-16 MED ORDER — POTASSIUM CHLORIDE 10 MEQ/50ML IV SOLN
10.0000 meq | INTRAVENOUS | Status: AC
  Administered 2021-10-16 (×4): 10 meq via INTRAVENOUS
  Filled 2021-10-16 (×4): qty 50

## 2021-10-16 MED ORDER — FREE WATER
20.0000 mL | Status: DC
  Administered 2021-10-16 – 2021-10-18 (×11): 20 mL

## 2021-10-16 MED ORDER — TRACE MINERALS CU-MN-SE-ZN 300-55-60-3000 MCG/ML IV SOLN
INTRAVENOUS | Status: AC
  Filled 2021-10-16: qty 1088

## 2021-10-16 MED ORDER — POTASSIUM PHOSPHATES 15 MMOLE/5ML IV SOLN
15.0000 mmol | Freq: Once | INTRAVENOUS | Status: AC
  Administered 2021-10-16: 15 mmol via INTRAVENOUS
  Filled 2021-10-16: qty 5

## 2021-10-16 NOTE — Inpatient Diabetes Management (Signed)
Inpatient Diabetes Program Recommendations  AACE/ADA: New Consensus Statement on Inpatient Glycemic Control (2015)  Target Ranges:  Prepandial:   less than 140 mg/dL      Peak postprandial:   less than 180 mg/dL (1-2 hours)      Critically ill patients:  140 - 180 mg/dL    Latest Reference Range & Units 10/15/21 16:33 10/15/21 17:55 10/15/21 18:47 10/15/21 19:33 10/15/21 20:33 10/15/21 22:18 10/16/21 00:19 10/16/21 02:15 10/16/21 03:56 10/16/21 06:14  Glucose-Capillary 70 - 99 mg/dL 139 (H)  7 units/hr 131 (H)  5 units/hr 151 (H)  8 units/hr 140 (H)  3.6 units/hr 165 (H)  9.5 units/hr 162 (H)  8.5 units/hr 163 (H)  9 units/hr 147 (H)  6.5 units/hr 171 (H)  9.5 units/hr 156 (H)  7.5 units/hr  (H): Data is abnormally high     Home DM Meds: Lantus 100 units QHS                              Metformin 1500 mg QPM                              Regular Insulin 50 units TID     Current Orders: IV Insulin Drip         TPN running 70cc/hr (+ 20 units Insulin in solution)   Tube feeds stopped   Pt has received a total of 74.1 units IV Insulin since 4pm 8/27 (about an average of 7 units/hr)  IF pt averaging about 7 units/hr IV Insulin Drip rate, he may need as much as 168 units total insulin at this point to maintain glucose control (+ factoring in the 20 units Insulin pt is getting in the TPN solution)  If plan is to transition to SQ Insulin, pt will need a fair amount of Insulin to maintain good control.   If plan is to continue TPN at 70cc/hr, We could try the following:  1. Start Semglee 40 units BID for basal needs (this would be about 50% of what pt was getting on the IV Insulin Drip)  2. Start Novolog 0-20 units Q4 hours  3. Increase Insulin in the TPN slightly to 30 units Insulin    --Will follow patient during hospitalization--  Wyn Quaker RN, MSN, [place] Diabetes Coordinator Inpatient Glycemic Control Team Team Pager: [phone]  (8a-5p)

## 2021-10-16 NOTE — Consult Note (Addendum)
PHARMACY - TOTAL PARENTERAL NUTRITION CONSULT NOTE   Indication: Prolonged ileus  Patient Measurements: Height: '5\' 11"'$  (180.3 cm) Weight: 99.8 kg (220 lb 0.3 oz) IBW/kg (Calculated) : 75.3   Body mass index is 30.69 kg/m.  Assessment:  Patient is a 71 y/o M with past medical history including CAD, ischemic cardiomyopathy, DM, HTN, CKD, OSA who presented to the ED 8/12 with abdominal pain, nausea, and vomiting. Imaging was concerning for mesenteric ischemia and patient was taken for emergent exploratory laparotomy with lysis of adhesions and no mesenteric ischemia noted. Patient was extubated 8/13. Post-operative course complicated by delirium, ongoing ileus. Given overall clinical picture, patient was re-intubated 8/15 and PICC was placed to start TPN 8/15. Did not tolerate tube feeds 8/20, TPN was stopped briefly and then restarted. Lurline Idol was placed on 8/25.  Glucose / Insulin: History of DM (last Hgb A1c 7.1%). BG last 24h: 140-170s. Patient continues on an insulin gtt.  Electrolytes: Na 145>148>151 (NaHCO3 gtt discontinued) K 3.4>3.3 -- (KCL IV 62mq q1hr x4; w/ 253m from Kphos) Phos 2.7>2.3 -- (Kphos 1524m IV x1 per CCM) Renal: Scr 1.3-1.5 >>1-1.1 Hepatic: No transaminitis. LFTs normal Intake / Output; MIVF: I&O: -2.6L; UOP 1.1ml45mh GI Imaging: 8/12 CT abdomen / pelvis: Concerning for acute mesenteric ischemia. Extensive pneumatosis within several loops of small bowel within the central abdomen with mucosal hypoenhancement. Extensive mesenteric and portal venous gas. Small volume pneumoperitoneum.Multiple dilated loops of small bowel without abrupt transition point. It is uncertain if this is secondary to bowel ischemia or mechanical obstruction. 8/13 CT abdomen / pelvis: Resolution of previously noted pneumatosis and portal venous gas portal venous gas. There are some persistent areas of small-bowel dilatation, with multiple ill-defined loops of small bowel and multiple air-fluid  levels. This may simply represent a mild postoperative ileus, although persistent partial small bowel obstruction is not entirely excluded. 8/23 XR Abd: Nonobstructive bowel gas pattern, with nasogastric tube to the Stomach 8/27 CT angio: there is a fistula now seen from the anterior wall of the transverse colon in this area, with air, feculent material and contrast extending to the proximal surgical incision. GI Surgeries / Procedures:  8/12: ExLap with lysis of adhesions. No mesenteric ischemia found  Central access: 8/15 TPN start date: 8/15  Nutritional Goals: Goal TPN rate is 80 mL/hr (provides 163.2 g of protein and 2304 kcals per day)  RD Assessment: Estimated Needs Total Energy Estimated Needs: 2200-2500kcal/day Total Protein Estimated Needs: 150-160g/day Total Fluid Estimated Needs: 2.0L/day  Current Nutrition:  Tube feeding titrating to goal of 55 mL/hr. Tube feeds were stopped d/t fistula/perf.    Plan:  Increased rate of TPN to 80 mL/hr to meet caloric needs as tube feeds stopped and propofol was discontinued.  -- Anticipate need for further insulin added to TPN given new D5W gtt and inc'd TPN, and patient continues on insuline gtt currently. -- Triglycerides elevated at baseline, but labile throughout admission in response to propofol (no off) & hyperglycemia (now better controlled). SMOF has less effect, but will check Trig level on 8/29 to confirm. Nutritional components Protein (as 15% Clinisol): 163.2 grams Dextrose: 15% Lipids (as 20% SMOFilpids): 67.2 grams kCal 2304 /24h 1920ml26mluid & D5W '@75ml'$ /h (1800ml/53m-Electrolytes in TPN: Na 50mEq/102m 50mEq/L70m 5mEq/L, 27m5mEq/L, a75mPhos 15mmol/L. 38mc 1:1 --Add standard MVI and trace elements to TPN  --BG control: Continue with insulin gtt and added 20 units insulin to TPN in hopes to get insulin infusion stopped.  - lipids added back  to TPN as propofol stopped on 8/27. TG level 8/26 '462mg'$ /dL up from 289 on  8/24. --Monitor TPN labs daily until stable  Lorna Dibble 10/16/2021,8:23 AM

## 2021-10-16 NOTE — Progress Notes (Signed)
Patient ID: Todd Spencer, male   DOB: 1950-11-09, 71 y.o.   MRN: [id number]     [facility] Day(s): 16.   Interval History: Patient seen and examined, no acute events or new complaints overnight.  Patient continues to be critically ill, on mechanical ventilation, s/p trach.   No increasing output from the colocutaneous fistula.  Vital signs in last 24 hours: [min-max] current  Temp:  [99.7 F (37.6 C)-100.3 F (37.9 C)] 100 F (37.8 C) (08/28 0600) Pulse Rate:  [98-114] 107 (08/28 0922) Resp:  [10-23] 18 (08/28 0922) BP: (117-164)/(73-99) 145/73 (08/28 0800) SpO2:  [88 %-97 %] 95 % (08/28 0922) FiO2 (%):  [28 %] 28 % (08/28 0922) Weight:  [99.8 kg] 99.8 kg (08/28 0429)     Height: '5\' 11"'$  (180.3 cm) Weight: 99.8 kg BMI (Calculated): 30.7   Physical Exam:  Constitutional: alert, and no distress  Respiratory: breathing non-labored at rest  Cardiovascular: regular rate and sinus rhythm  Gastrointestinal: soft, non-tender, and non-distended.  Wound partially open in the upper portion.  There is drainage c/w colo-cutaneous fistula coming from the very cephalad area of the wound.  Labs:     Latest Ref Rng & Units 10/16/2021    4:00 AM 10/15/2021    4:47 PM 10/15/2021   12:32 PM  CBC  WBC 4.0 - 10.5 K/uL 8.9     Hemoglobin 13.0 - 17.0 g/dL 8.9  8.8  9.4   Hematocrit 39.0 - 52.0 % 27.7  28.7  29.8   Platelets 150 - 400 K/uL 203         Latest Ref Rng & Units 10/16/2021    4:00 AM 10/15/2021    4:00 PM 10/15/2021    1:13 AM  CMP  Glucose 70 - 99 mg/dL 175   141   BUN 8 - 23 mg/dL 48   45   Creatinine 0.61 - 1.24 mg/dL 1.09   1.10   Sodium 135 - 145 mmol/L 151   148   Potassium 3.5 - 5.1 mmol/L 3.3  5.0  3.4   Chloride 98 - 111 mmol/L 119   121   CO2 22 - 32 mmol/L 25   22   Calcium 8.9 - 10.3 mg/dL 9.0   9.2   Total Protein 6.5 - 8.1 g/dL 6.5     Total Bilirubin 0.3 - 1.2 mg/dL 0.7     Alkaline Phos 38 - 126 U/L 54     AST 15 - 41 U/L 68     ALT 0 - 44  U/L 74       Imaging studies: I personally evaluated the images of the CT scans.  There is significant inflammation in the mid transverse colon with suspected bubble of air outside of the lumen and contrast.  No drainable fluid collection.  This is consistent with clinical output to the wound with colocutaneous fistula. CT ANGIO GI BLEED Study Result  Narrative & Impression  CLINICAL DATA:  Rectal bleeding with a suspected diverticular bleed.   EXAM: CTA ABDOMEN AND PELVIS WITHOUT AND WITH CONTRAST   TECHNIQUE: Multidetector CT imaging of the abdomen and pelvis was performed using the standard protocol during bolus administration of intravenous contrast. Multiplanar reconstructed images and MIPs were obtained and reviewed to evaluate the vascular anatomy.   RADIATION DOSE REDUCTION: This exam was performed according to the departmental dose-optimization program which includes automated exposure control, adjustment of the mA and/or kV according to patient size  and/or use of iterative reconstruction technique.   CONTRAST:  18m OMNIPAQUE IOHEXOL 350 MG/ML SOLN   COMPARISON:  CT abdomen with IV and oral contrast yesterday 5:01 p.m., CT chest, abdomen and pelvis without contrast 10/01/2021, and CT with contrast 09/25/2021.   FINDINGS: VASCULAR   Aorta: There is moderate to heavy patchy calcific wall plaque. There is no aneurysm, stenosis, dissection penetrating ulcer.   Celiac: Widely patent with only trace calcific plaques.   SMA: Widely patent with mild nonstenosing ostial calcifications.   Renals: Widely patent with nonstenosing ostial calcific plaques on the left.   IMA: High-grade calcific origin stenosis.  Otherwise opacifies well.   Inflow: There is moderate patchy calcification of the common iliac arteries without stenosis. There are mild scattered calcifications in the external iliac arteries without stenosis. There are moderate calcifications in the internal  iliac arteries without stenosis.   Proximal Outflow: There are moderate calcifications of the posterior wall of the common femoral arteries without stenosis, minimal calcification in the proximal superficial femoral arteries without stenosis. The proximal profunda and circumflex femoral arteries are clear.   Veins: Patent normal caliber portal vein, SMV and splenic vein, IVC and pelvic deep veins, and common femoral veins.   Review of the MIP images confirms the above findings.   NON-VASCULAR   Lower chest: There is stable atelectasis in lower lobes. Lung bases are otherwise clear. The LAD and right coronary artery are heavily calcified. The cardiac size is normal. No pericardial effusion   Hepatobiliary: 20 cm length slightly steatotic liver. Small cyst in the dome of the left lobe is chronic and unchanged. There is no mass enhancement. Surgically absent gallbladder without biliary dilatation.   Portions of the left lobe of the liver have a nodular surface contour consistent with at least mild underlying cirrhosis.   Pancreas: No focal abnormality.   Spleen: No focal abnormality. Mildly prominent, measures 13.9 cm AP.   Adrenals/Urinary Tract: There is no adrenal mass. There are stable small renal cysts and subcentimeter cortical hypodensities. There is no urinary stone or obstruction. Unremarkable bladder.   Stomach/Bowel: According to the information available in epic, lysis of adhesions was performed August 12 with exploratory laparotomy.   As seen on yesterday's study, there is an apparently contained perforation underlying the upper aspect of the laparotomy incision, in the mid transverse colon right of the midline anteriorly on 7: 54-59, with trace extravasated contrast.   There are inflammatory changes in the area but no free air seen elsewhere or other extravasated contrast. This was first noted on yesterday's CT and has not significantly changed.   Today there  does appear to be a fistula forming from the ventral-wall-adherent anterior wall of the transverse colon in this area to the upper surgical incision and this is best seen on 7: 47-50.   A separate, blind ending fistula extends from the anterior wall of transverse colon towards the abdominal wall and was noted yesterday as well, on 7: 57-60.   Enteric catheter with radiopaque tip is seen with tip in the gastric antrum. There is mild gastric air distention without wall thickening. There is increased dilatation mid to lower abdominal small bowel segments up to 4.2 cm caliber, without visible transition could be due to ileus or partial small bowel obstruction.   An appendix is not seen in this patient. The rest of the large bowel wall remarkable for diverticula along the left colon but no focal acute process. Rectal tube has been reinserted since yesterday.  Please note there is contrast throughout the colon and arterial or venous extravasation into the colon cannot be assessed.   Lymphatic: No adenopathy.   Reproductive: Enlarged prostate, 5.1 cm transverse, mild bladder impression.   Other: Small inguinal fat hernias.  No incarcerated hernia.   Musculoskeletal: Mild osteopenia and degenerative change of the spine.   IMPRESSION: VASCULAR   1. There is contrast in the colon. Assessment for arterial or venous extravasation into the colon cannot be made. 2. Aortic and branch vessel atherosclerosis. 3. High-grade IMA origin stenosis, but no other visceral artery stenoses are seen. 4. No significant inflow or proximal outflow stenosis. 5. Heavily calcified LAD and right coronary arteries.   NON-VASCULAR   1. A contained perforation is again noted of the mid transverse colon right of midline anteriorly, and a small amount of contrast extravasation. Appearance is similar with yesterday's exam. 2. Newly noted however, there is a fistula now seen from the anterior wall of the  transverse colon in this area, with air, feculent material and contrast extending to the proximal surgical incision. 3. A blind-ending fistula continues to be seen extending towards the abdominal wall below this level. 4. Increased small-bowel dilatation today in the mid to lower abdomen consistent with ileus or partial small bowel obstruction. A transitional segment not identified. 5. In all other respects there are no further changes.     Electronically Signed   By: Telford Nab M.D.   On: 10/15/2021 22:49        Assessment/Plan:  71 y.o. male with SBO 16 days s/p exploratory laparotomy with lysis of additions, complicated by pertinent comorbidities including respiratory failure, mechanical ventilation, and now colo-cutaneous fistula.   -Patient having low-grad fever, tachycardia.  Temp and heart rate has been improving after opening of the wound.  Patient with colonic fistula confirmed.   Continue with changes at least twice per day and as needed with wet-to-dry dressings. -Continue Zosyn for antibiotic therapy seems to be responding well to this.  - Because this is colonic, I suspect that holding enteral nutrition is no longer helpful, as his relative malnutrition is a contributing factor.  Would resume trickle feedings and reassess control of fistula output. -Continue TPN  GI bleeding -Hemoglobin stable.  We will continue to follow.   Ronny Bacon, M.D., [facility]  10/16/2021 ; 10:23 AM
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
## 2021-10-16 NOTE — Progress Notes (Signed)
NAME:  URIYAH RASKA, MRN:  [id number], DOB:  December 11, 1950, LOS: 34 ADMISSION DATE:  10/03/2021, CONSULTATION DATE: 10/11/2021  History of Present Illness/SYNOPSIS  71 year old male with a history of CAD, ischemic cardiomyopathy, type 2 diabetes, HTN, CKD, and OSA who presented to the ED with abdominal pain, nausea and vomiting. CT scan of the abdomen was performed showing portal venous and mesenteric gas, highly concerning for mesenteric ischemia.  He was started on antibiotics and taken emergently to the OR by Dr. Christian Mate for an exploratory laparotomy. History was mostly obtained by chart review as the patient was transferred to the ICU intubated following his procedure.   Dr. Christian Mate noted multiple adhesions in the abdomen which were excised and lysed. A mild non-compromised transition area was noted, with the small bowel appearing healthy and well perfused, with no signs of discoloration pneumatosis or perforation. Similarly, the colon was healthy appearing. Following the procedure, he was transferred to the ICU   FAILURE TO [place] S/P TRACH +ENTEROCUTANEOUS FISTULA    Pertinent  Medical History  CAD s/p stenting to his RCA and LAD HFrEF, ischemic cardiomyopathy HTN HLD T2DM OSA on CPAP  Significant Hospital Events: Including procedures, antibiotic start and stop dates in addition to other pertinent events   09/28/2021: Exploratory laparotomy with lysis of adhesions, no       mesenteric ischemia noted 10/06/2021: Overnight pt with 450 ml of bloody OGT concerning       for possible GIB and worsening hyperkalemia  10/08/2021: Pt extubated to [place]  10/01/2021: CT Chest/Abd/Pelvis revealed Resolution of previously noted pneumatosis and portal venous gas portal venous gas. There are some persistent areas of small-bowel dilatation, with multiple ill-defined loops of small bowel and multiple air-fluid levels. This may simply represent a mild postoperative ileus, although  persistent partial small bowel obstruction is not entirely excluded. Trace volume of ascites. Dependent opacities in the lung bases bilaterally favored to predominantly reflect areas of subsegmental atelectasis and/or chronic scarring, although a small amount of airspace consolidation is not excluded in the posterior aspect of the right lower lobe. Aortic atherosclerosis, in addition to left main and three-vessel coronary artery disease. Assessment for potential risk factor modification, dietary therapy or pharmacologic therapy may be warranted, if clinically indicated. Additional incidental findings, as above. 10/02/2021: Pt developed worsening delirium/agitation overnight requiring precedex gtt.  Pt lethargic this am but able to follow commands with no signs of agitation.  Will attempt to wean off Bipap to HFNC once mentation improves.  Minimal NGT output overnight  10/03/2021- Trickle feeds initiated on 08/14 _0  ml/hr.  However, overnight he pulled the NGT out.  This am he is having worsening abdominal distension and pain. KUB revealed ileus pattern with slightly progressing bowel gas pattern.  Orders placed to reinsert NGT once placement confirmed will place to LIS.  Intubated to allow time for ileus to resolve (see discussion 10/03/21 note) 8/18 remains on vent, ABD distended, failure to wean from vent 8/19 remains on vent, ABD distended, tolerated TF's 8/20 DID NOT TOLERATE TUBE FEEDS 8/21 severe hypoxia, possible mucus plugs 8/22 tolerating feeds family to come to bedside today 8/23 failure to wean from vent, ENT consulted for [facility] 8/25 [facility] 8/26 wound drainage, CT w/ small fistula 8/27 remains on vent, remains critically ill  Interim History / Subjective:  Remains critically ill +ileus and enterocutaneous fistula Failure to wean from vent S/p TRACH       Latest Ref Rng & Units 10/16/2021  4:00 AM 10/15/2021    4:00 PM 10/15/2021    1:13 AM  BMP  Glucose 70 - 99 mg/dL 175   141    BUN 8 - 23 mg/dL 48   45   Creatinine 0.61 - 1.24 mg/dL 1.09   1.10   Sodium 135 - 145 mmol/L 151   148   Potassium 3.5 - 5.1 mmol/L 3.3  5.0  3.4   Chloride 98 - 111 mmol/L 119   121   CO2 22 - 32 mmol/L 25   22   Calcium 8.9 - 10.3 mg/dL 9.0   9.2         Objective   Blood pressure (!) 153/80, pulse (!) 110, temperature 100 F (37.8 C), temperature source Axillary, resp. rate 15, height _0  (1.803 m), weight 99.8 kg, SpO2 96 %.    Vent Mode: PRVC FiO2 (%):  [28 %] 28 % Set Rate:  [14 bmp] 14 bmp Vt Set:  [550 mL] 550 mL PEEP:  [5 cmH20] 5 cmH20 Pressure Support:  [8 cmH20] 8 cmH20 Plateau Pressure:  [11 cmH20] 11 cmH20   Intake/Output Summary (Last 24 hours) at 10/16/2021 0752 Last data filed at 10/16/2021 0600 Gross per 24 hour  Intake 2665.54 ml  Output 2900 ml  Net -234.46 ml    Filed Weights   10/14/21 0310 10/15/21 0333 10/16/21 0429  Weight: 98.8 kg 99 kg 99.8 kg    REVIEW OF SYSTEMS  PATIENT IS UNABLE TO PROVIDE COMPLETE REVIEW OF SYSTEMS DUE TO SEVERE CRITICAL ILLNESS    PHYSICAL EXAMINATION:  GENERAL:critically ill appearing,  EYES: Pupils equal, round, reactive to light.  No scleral icterus.  MOUTH: Moist mucosal membrane. S/p trach NECK: Supple.  PULMONARY: +rhonchi,  CARDIOVASCULAR: S1 and S2.  No murmurs  GASTROINTESTINAL: laparotomy is CDI with staples and honeycomb; no erythema, no drainage+distended MUSCULOSKELETAL: edema.  NEUROLOGIC:awake, not following commands SKIN:intact,warm,dry    Assessment & Plan:   71 yo obese WM with  s/p exploratory laparotomy and LOA for CT findings concerning for SBO, pneumatosis, and pneumoperitoneum anticipated post-operative ileus associated with severe hypoxic/hypercapnic resp failure and failure to wean from vent with enterocutaneous fistula  Severe ACUTE Hypoxic and Hypercapnic Respiratory Failure S/p trach failure to wean from vent Wean to PS as tolerated -continue Mechanical Ventilator  support -Wean Fio2 and PEEP as tolerated -VAP/VENT bundle implementation - Wean PEEP & FiO2 as tolerated, maintain SpO2 > 88% - Head of bed elevated 30 degrees, VAP protocol in place - Plateau pressures less than 30 cm H20  - Intermittent chest x-ray & ABG PRN - Ensure adequate pulmonary hygiene  -will perform SAT/SBT when respiratory parameters are met ENT CONSULTED FOR TRACH, COMPLETED 8/25   Postoperative hypoxemic respiratory failure- due risk of aspiration (see discussion 10/03/21), decision made to keep on mechanical ventilation until return of bowel function-NOT TOLERATING TF's  +ENTEROCUTANEOUS FISTULA CONTINUE TPN  BBO with small bowel ischemia- s/p ex lap LOA 8/12 Postoperative ileus- follow up GEN surg RECS Maintain  TPN Drainage from wound CT abd: "Suspected injury/microperforation of the mid transverse colon in the anterior abdomen, immediately beneath the surgical incision. Associated tiny foci of gas and possible extraluminal contrast, as described above. No associated drainable collection/abscess. No gross free air. Surgical consultation is suggested." Zosyn started 8/26 Conservative treatment planned at this time   NEUROLOGY ACUTE METABOLIC ENCEPHALOPATHY Off all sedation at this time Still delirium Surveillance neuro-imaging via CT  RENAL -continue Foley Catheter-assess need -Avoid nephrotoxic agents -  Follow urine output, BMP -Ensure adequate renal perfusion, optimize oxygenation -Renal dose medications -TPN adjustment for hyperNa  Intake/Output Summary (Last 24 hours) at 10/16/2021 0757 Last data filed at 10/16/2021 0600 Gross per 24 hour  Intake 2665.54 ml  Output 2900 ml  Net -234.46 ml     ACUTE ANEMIA- TRANSFUSE AS NEEDED CONSIDER TRANSFUSION  IF HGB<7   ENDO - ICU hypoglycemic\Hyperglycemia protocol -check FSBS per protocol   GI GI PROPHYLAXIS as indicated  NUTRITIONAL STATUS DIET-->TF's on hold Constipation protocol as  indicated   ELECTROLYTES -follow labs as needed -replace as needed -pharmacy consultation and following  Diet/type: TPN DVT prophylaxis: prophylactic heparin  GI prophylaxis: PPI Lines: PICC Foley:  Yes, and it is still needed: keep for a few more days      DVT/GI PRX  assessed I Assessed the need for Labs I Assessed the need for Foley I Assessed the need for Central Venous Line Family Discussion when available I Assessed the need for Mobilization I made an Assessment of medications to be adjusted accordingly Safety Risk assessment completed  CASE DISCUSSED IN MULTIDISCIPLINARY ROUNDS WITH ICU TEAM    Critical Care Time devoted to patient care services described in this note is 48 minutes.   Critical care was necessary to treat /prevent imminent and life-threatening deterioration.   Corrin Parker, M.D.  [facility]  Medical Director [place] Director [facility] Cardio-Pulmonary Department

## 2021-10-17 DIAGNOSIS — R109 Unspecified abdominal pain: Secondary | ICD-10-CM | POA: Diagnosis not present

## 2021-10-17 DIAGNOSIS — K559 Vascular disorder of intestine, unspecified: Secondary | ICD-10-CM | POA: Diagnosis not present

## 2021-10-17 DIAGNOSIS — K55059 Acute (reversible) ischemia of intestine, part and extent unspecified: Secondary | ICD-10-CM | POA: Diagnosis not present

## 2021-10-17 DIAGNOSIS — J9601 Acute respiratory failure with hypoxia: Secondary | ICD-10-CM | POA: Diagnosis not present

## 2021-10-17 LAB — GLUCOSE, CAPILLARY
Glucose-Capillary: 161 mg/dL — ABNORMAL HIGH (ref 70–99)
Glucose-Capillary: 162 mg/dL — ABNORMAL HIGH (ref 70–99)
Glucose-Capillary: 166 mg/dL — ABNORMAL HIGH (ref 70–99)
Glucose-Capillary: 169 mg/dL — ABNORMAL HIGH (ref 70–99)
Glucose-Capillary: 171 mg/dL — ABNORMAL HIGH (ref 70–99)
Glucose-Capillary: 173 mg/dL — ABNORMAL HIGH (ref 70–99)
Glucose-Capillary: 174 mg/dL — ABNORMAL HIGH (ref 70–99)
Glucose-Capillary: 177 mg/dL — ABNORMAL HIGH (ref 70–99)
Glucose-Capillary: 177 mg/dL — ABNORMAL HIGH (ref 70–99)
Glucose-Capillary: 179 mg/dL — ABNORMAL HIGH (ref 70–99)
Glucose-Capillary: 185 mg/dL — ABNORMAL HIGH (ref 70–99)
Glucose-Capillary: 185 mg/dL — ABNORMAL HIGH (ref 70–99)
Glucose-Capillary: 186 mg/dL — ABNORMAL HIGH (ref 70–99)
Glucose-Capillary: 186 mg/dL — ABNORMAL HIGH (ref 70–99)
Glucose-Capillary: 186 mg/dL — ABNORMAL HIGH (ref 70–99)
Glucose-Capillary: 187 mg/dL — ABNORMAL HIGH (ref 70–99)
Glucose-Capillary: 190 mg/dL — ABNORMAL HIGH (ref 70–99)
Glucose-Capillary: 196 mg/dL — ABNORMAL HIGH (ref 70–99)
Glucose-Capillary: 196 mg/dL — ABNORMAL HIGH (ref 70–99)
Glucose-Capillary: 200 mg/dL — ABNORMAL HIGH (ref 70–99)
Glucose-Capillary: 204 mg/dL — ABNORMAL HIGH (ref 70–99)
Glucose-Capillary: 234 mg/dL — ABNORMAL HIGH (ref 70–99)

## 2021-10-17 LAB — HEMOGLOBIN AND HEMATOCRIT, BLOOD
HCT: 27.9 % — ABNORMAL LOW (ref 39.0–52.0)
HCT: 28.5 % — ABNORMAL LOW (ref 39.0–52.0)
HCT: 28.6 % — ABNORMAL LOW (ref 39.0–52.0)
Hemoglobin: 8.6 g/dL — ABNORMAL LOW (ref 13.0–17.0)
Hemoglobin: 8.8 g/dL — ABNORMAL LOW (ref 13.0–17.0)
Hemoglobin: 8.9 g/dL — ABNORMAL LOW (ref 13.0–17.0)

## 2021-10-17 LAB — CBC WITH DIFFERENTIAL/PLATELET
Abs Immature Granulocytes: 0.12 10*3/uL — ABNORMAL HIGH (ref 0.00–0.07)
Basophils Absolute: 0.1 10*3/uL (ref 0.0–0.1)
Basophils Relative: 1 %
Eosinophils Absolute: 0.2 10*3/uL (ref 0.0–0.5)
Eosinophils Relative: 2 %
HCT: 28.7 % — ABNORMAL LOW (ref 39.0–52.0)
Hemoglobin: 8.7 g/dL — ABNORMAL LOW (ref 13.0–17.0)
Immature Granulocytes: 1 %
Lymphocytes Relative: 7 %
Lymphs Abs: 0.7 10*3/uL (ref 0.7–4.0)
MCH: 28.7 pg (ref 26.0–34.0)
MCHC: 30.3 g/dL (ref 30.0–36.0)
MCV: 94.7 fL (ref 80.0–100.0)
Monocytes Absolute: 0.7 10*3/uL (ref 0.1–1.0)
Monocytes Relative: 7 %
Neutro Abs: 7.5 10*3/uL (ref 1.7–7.7)
Neutrophils Relative %: 82 %
Platelets: 211 10*3/uL (ref 150–400)
RBC: 3.03 MIL/uL — ABNORMAL LOW (ref 4.22–5.81)
RDW: 15.7 % — ABNORMAL HIGH (ref 11.5–15.5)
WBC: 9.1 10*3/uL (ref 4.0–10.5)
nRBC: 0 % (ref 0.0–0.2)

## 2021-10-17 LAB — PHOSPHORUS: Phosphorus: 3.1 mg/dL (ref 2.5–4.6)

## 2021-10-17 LAB — BASIC METABOLIC PANEL
Anion gap: 3 — ABNORMAL LOW (ref 5–15)
BUN: 43 mg/dL — ABNORMAL HIGH (ref 8–23)
CO2: 24 mmol/L (ref 22–32)
Calcium: 9.2 mg/dL (ref 8.9–10.3)
Chloride: 121 mmol/L — ABNORMAL HIGH (ref 98–111)
Creatinine, Ser: 1.06 mg/dL (ref 0.61–1.24)
GFR, Estimated: >60 mL/min (ref >60)
Glucose, Bld: 181 mg/dL — ABNORMAL HIGH (ref 70–99)
Potassium: 3.3 mmol/L — ABNORMAL LOW (ref 3.5–5.1)
Sodium: 148 mmol/L — ABNORMAL HIGH (ref 135–145)

## 2021-10-17 LAB — TRIGLYCERIDES: Triglycerides: 255 mg/dL — ABNORMAL HIGH (ref <150)

## 2021-10-17 LAB — MAGNESIUM: Magnesium: 1.8 mg/dL (ref 1.7–2.4)

## 2021-10-17 MED ORDER — TRACE MINERALS CU-MN-SE-ZN 300-55-60-3000 MCG/ML IV SOLN
INTRAVENOUS | Status: DC
  Filled 2021-10-17: qty 1088

## 2021-10-17 MED ORDER — POTASSIUM CHLORIDE 20 MEQ PO PACK
40.0000 meq | PACK | Freq: Once | ORAL | Status: AC
  Administered 2021-10-17: 40 meq
  Filled 2021-10-17: qty 2

## 2021-10-17 MED ORDER — POTASSIUM CHLORIDE 10 MEQ/50ML IV SOLN
10.0000 meq | INTRAVENOUS | Status: AC
  Administered 2021-10-17 (×4): 10 meq via INTRAVENOUS
  Filled 2021-10-17 (×4): qty 50

## 2021-10-17 NOTE — Progress Notes (Signed)
[facility] Hospital Day(s): [place] op day(s): 4 Days Post-Op.   Interval History:  Patient seen and examined Tracheostomy 08/25 T-max in lst 24 hours 100.14F WBC normal this morning; 9.1K Renal function normal; sCr - 1.06; UO - 2425 ccs Mild hypokalemia to 3.3 Now with colocutaneous fistula to the superior portion of midline wound Rectal tube; 325 ccs On trickle enteric feedings + TPN  Vital signs in last 24 hours: [min-max] current  Temp:  [98.8 F (37.1 C)-100.1 F (37.8 C)] 98.9 F (37.2 C) (08/29 0000) Pulse Rate:  [84-109] 101 (08/29 0600) Resp:  [11-25] 21 (08/29 0600) BP: (114-163)/(73-98) 156/78 (08/29 0600) SpO2:  [90 %-99 %] 97 % (08/29 0600) FiO2 (%):  [25 %-28 %] 25 % (08/29 0000) Weight:  [99.7 kg] 99.7 kg (08/29 0415)     Height: $Remove'5\' 11"'cAuLsgs$  (180.3 cm) Weight: 99.7 kg BMI (Calculated): 30.67   Intake/Output last 2 shifts:  08/28 0701 - 08/29 0700 In: 3999.8 [I.V.:3629.6; NG/GT:222.5; IV Piggyback:147.8] Out: 2795 [Urine:2425; Stool:370]   Physical Exam:  Constitutional: Alert, awake HEENT: NGT in place; trickle feeds. S/P tracheostomy  Respiratory: On ventilator  Cardiovascular: Intermittently tachycardic and sinus rhythm  Gastrointestinal: Soft, non-distended. No rebound/guarding Genitourinary: Condom catheter in place; clear urine.  Integumentary: To the superior portion of the laparotomy incision there is feculent output consistent with colocutaneous fistula, there remaining portion of the wound is CDI, no erythema or drainage   Labs:     Latest Ref Rng & Units 10/17/2021    5:27 AM 10/17/2021   12:16 AM 10/16/2021    6:10 PM  CBC  WBC 4.0 - 10.5 K/uL  9.1    Hemoglobin 13.0 - 17.0 g/dL 8.6  8.7  8.7   Hematocrit 39.0 - 52.0 % 27.9  28.7  27.9   Platelets 150 - 400 K/uL  211        Latest Ref Rng & Units 10/17/2021    5:27 AM 10/16/2021    2:55 PM 10/16/2021    4:00 AM  CMP  Glucose 70 - 99 mg/dL 181    175   BUN 8 - 23 mg/dL 43   48   Creatinine 0.61 - 1.24 mg/dL 1.06   1.09   Sodium 135 - 145 mmol/L 148   151   Potassium 3.5 - 5.1 mmol/L 3.3  3.4  3.3   Chloride 98 - 111 mmol/L 121   119   CO2 22 - 32 mmol/L 24   25   Calcium 8.9 - 10.3 mg/dL 9.2   9.0   Total Protein 6.5 - 8.1 g/dL   6.5   Total Bilirubin 0.3 - 1.2 mg/dL   0.7   Alkaline Phos 38 - 126 U/L   54   AST 15 - 41 U/L   68   ALT 0 - 44 U/L   74      Imaging studies: No new pertinent imaging studies   Assessment/Plan:  71 y.o. male now with colocutaneous fistula 17 Days Post-Op s/p exploratory laparotomy and LOA for CT findings concerning for SBO, pneumatosis, and pneumoperitoneum   - Okay to continue trickle feeds only today. Would hold on advancing just yet as we need to monitor colocutaneous fistula output. If output of this fistula is low or stops; there is a chance of spontaneous closure. If output is high, I believe we can proceed with advancement of tube feedings as this will act essentially like transverse  loop colostomy.   - Continue TPN at goal rate; monitor electrolytes  - Monitor abdominal examination; on-going bowel function   - Monitor renal function; normalized   - Monitor leukocytosis; normalized  - OOB once feasible  - Further management per primary service; we will follow  All of the above findings and recommendations were discussed with the medical team.   -- Edison Simon, PA-C [facility] 10/17/2021, 7:54 AM M-F: 7am - 4pm

## 2021-10-17 NOTE — Progress Notes (Signed)
OT Cancellation Note  Patient Details Name: Todd Spencer MRN: [id number] DOB: [birth year]   Cancelled Treatment:    Reason Eval/Treat Not Completed: Patient not medically ready;Fatigue/lethargy limiting ability to participate. Order received, chart reviewed. Per RN limited purposeful movement this date. Upon arrival pt unable to keep eyes open, does not follow commands or respond to PROM. Will re-attempt next date as pt appropriate to initiate services.   Dessie Coma, M.S. OTR/L  10/17/21, 3:11 PM  ascom [phone]

## 2021-10-17 NOTE — Progress Notes (Signed)
NAME:  NIMA KEMPPAINEN, MRN:  [id number], DOB:  1950/07/25, LOS: 62 ADMISSION DATE:  10/16/2021, CONSULTATION DATE: 09/29/2021  History of Present Illness/SYNOPSIS  71 year old male with a history of CAD, ischemic cardiomyopathy, type 2 diabetes, HTN, CKD, and OSA who presented to the ED with abdominal pain, nausea and vomiting. CT scan of the abdomen was performed showing portal venous and mesenteric gas, highly concerning for mesenteric ischemia.  He was started on antibiotics and taken emergently to the OR by Dr. Christian Mate for an exploratory laparotomy. History was mostly obtained by chart review as the patient was transferred to the ICU intubated following his procedure.   Dr. Christian Mate noted multiple adhesions in the abdomen which were excised and lysed. A mild non-compromised transition area was noted, with the small bowel appearing healthy and well perfused, with no signs of discoloration pneumatosis or perforation. Similarly, the colon was healthy appearing. Following the procedure, he was transferred to the ICU   FAILURE TO [place] S/P TRACH +ENTEROCUTANEOUS FISTULA    Pertinent  Medical History  CAD s/p stenting to his RCA and LAD HFrEF, ischemic cardiomyopathy HTN HLD T2DM OSA on CPAP  Significant Hospital Events: Including procedures, antibiotic start and stop dates in addition to other pertinent events   10/10/2021: Exploratory laparotomy with lysis of adhesions, no       mesenteric ischemia noted 10/04/2021: Overnight pt with 450 ml of bloody OGT concerning       for possible GIB and worsening hyperkalemia  10/14/2021: Pt extubated to [place]  10/01/2021: CT Chest/Abd/Pelvis revealed Resolution of previously noted pneumatosis and portal venous gas portal venous gas. There are some persistent areas of small-bowel dilatation, with multiple ill-defined loops of small bowel and multiple air-fluid levels. This may simply represent a mild postoperative ileus, although  persistent partial small bowel obstruction is not entirely excluded. Trace volume of ascites. Dependent opacities in the lung bases bilaterally favored to predominantly reflect areas of subsegmental atelectasis and/or chronic scarring, although a small amount of airspace consolidation is not excluded in the posterior aspect of the right lower lobe. Aortic atherosclerosis, in addition to left main and three-vessel coronary artery disease. Assessment for potential risk factor modification, dietary therapy or pharmacologic therapy may be warranted, if clinically indicated. Additional incidental findings, as above. 10/02/2021: Pt developed worsening delirium/agitation overnight requiring precedex gtt.  Pt lethargic this am but able to follow commands with no signs of agitation.  Will attempt to wean off Bipap to HFNC once mentation improves.  Minimal NGT output overnight  10/03/2021- Trickle feeds initiated on 08/14 '@10'$  ml/hr.  However, overnight he pulled the NGT out.  This am he is having worsening abdominal distension and pain. KUB revealed ileus pattern with slightly progressing bowel gas pattern.  Orders placed to reinsert NGT once placement confirmed will place to LIS.  Intubated to allow time for ileus to resolve (see discussion 10/03/21 note) 8/18 remains on vent, ABD distended, failure to wean from vent 8/19 remains on vent, ABD distended, tolerated TF's 8/20 DID NOT TOLERATE TUBE FEEDS 8/21 severe hypoxia, possible mucus plugs 8/22 tolerating feeds family to come to bedside today 8/23 failure to wean from vent, ENT consulted for [facility] 8/25 [facility] 8/26 wound drainage, CT w/ small fistula 8/27 remains on vent, remains critically ill 8/28 on TCT, +mod distress  Interim History / Subjective:  Generalized weakness +moderate distress on TCT Consider back on vent PS mode +ileus and enterocutaneous fistula Failure to wean from vent S/p  TRACH       Latest Ref Rng & Units 10/17/2021    5:27 AM  10/16/2021    2:55 PM 10/16/2021    4:00 AM  BMP  Glucose 70 - 99 mg/dL 181   175   BUN 8 - 23 mg/dL 43   48   Creatinine 0.61 - 1.24 mg/dL 1.06   1.09   Sodium 135 - 145 mmol/L 148   151   Potassium 3.5 - 5.1 mmol/L 3.3  3.4  3.3   Chloride 98 - 111 mmol/L 121   119   CO2 22 - 32 mmol/L 24   25   Calcium 8.9 - 10.3 mg/dL 9.2   9.0         Objective   Blood pressure (!) 156/78, pulse (!) 101, temperature 98.9 F (37.2 C), temperature source Axillary, resp. rate (!) 21, height '5\' 11"'$  (1.803 m), weight 99.7 kg, SpO2 97 %.    Vent Mode: PSV;CPAP FiO2 (%):  [25 %-28 %] 25 % PEEP:  [5 cmH20] 5 cmH20 Pressure Support:  [5 cmH20] 5 cmH20   Intake/Output Summary (Last 24 hours) at 10/17/2021 0733 Last data filed at 10/17/2021 0600 Gross per 24 hour  Intake 3999.82 ml  Output 2795 ml  Net 1204.82 ml    Filed Weights   10/15/21 0333 10/16/21 0429 10/17/21 0415  Weight: 99 kg 99.8 kg 99.7 kg    REVIEW OF SYSTEMS  PATIENT IS UNABLE TO PROVIDE COMPLETE REVIEW OF SYSTEMS DUE TO SEVERE CRITICAL ILLNESS    PHYSICAL EXAMINATION:  GENERAL:critically ill appearing, +resp distress EYES: Pupils equal, round, reactive to light.  No scleral icterus.  MOUTH: Moist mucosal membrane. S/p trach NECK: Supple.  PULMONARY: +rhonchi, +wheezing CARDIOVASCULAR: S1 and S2.  No murmurs  GASTROINTESTINAL: Wound partially open in the upper portion.  There is drainage c/w colo-cutaneous fistula coming from the very cephalad area of the wound. MUSCULOSKELETAL: edema.  NEUROLOGIC: lethargic SKIN:intact,warm,dry   Assessment & Plan:   71 yo obese WM with  s/p exploratory laparotomy and LOA for CT findings concerning for SBO, pneumatosis, and pneumoperitoneum anticipated post-operative ileus associated with severe hypoxic/hypercapnic resp failure and failure to wean from vent with enterocutaneous fistula  Severe ACUTE Hypoxic and Hypercapnic Respiratory Failure TCT as tolerated - Wean PFiO2 as  tolerated, maintain SpO2 > 88% - Head of bed elevated 30 degrees, VAP protocol in place - Intermittent chest x-ray & ABG PRN - Ensure adequate pulmonary hygiene  S/p trach for failure to wean from vent   Postoperative hypoxemic respiratory failure- due risk of aspiration (see discussion 10/03/21), decision made to keep on mechanical ventilation until return of bowel function- +ENTEROCUTANEOUS FISTULA CONTINUE TPN GEN SURGERY RECOMMENDS TRISCKLE FEEDS  SBO with small bowel ischemia- s/p ex lap LOA 8/12 Postoperative ileus- follow up GEN surg RECS Maintain  TPN Drainage from wound EC fistula Zosyn started 8/26 Conservative treatment planned at this time   NEUROLOGY ACUTE METABOLIC ENCEPHALOPATHY Critical illness polyneuropathy Off all sedation at this time  RENAL -continue Foley Catheter-assess need -Avoid nephrotoxic agents -Follow urine output, BMP -Ensure adequate renal perfusion, optimize oxygenation -Renal dose medications   Intake/Output Summary (Last 24 hours) at 10/17/2021 0738 Last data filed at 10/17/2021 0600 Gross per 24 hour  Intake 3999.82 ml  Output 2795 ml  Net 1204.82 ml    ACUTE ANEMIA- TRANSFUSE AS NEEDED CONSIDER TRANSFUSION  IF HGB<7   ENDO - ICU hypoglycemic\Hyperglycemia protocol -check FSBS per protocol   GI GI  PROPHYLAXIS as indicated  NUTRITIONAL STATUS DIET-->Surgery to start trickle feeds    ELECTROLYTES -follow labs as needed -replace as needed -pharmacy consultation and following  Diet/type: TPN DVT prophylaxis: prophylactic heparin  GI prophylaxis: PPI Lines: PICC Foley:  Yes, and it is still needed: keep for a few more days      DVT/GI PRX  assessed I Assessed the need for Labs I Assessed the need for Foley I Assessed the need for Central Venous Line Family Discussion when available I Assessed the need for Mobilization I made an Assessment of medications to be adjusted accordingly Safety Risk assessment  completed  CASE DISCUSSED IN MULTIDISCIPLINARY ROUNDS WITH ICU TEAM     Critical Care Time devoted to patient care services described in this note is 48 minutes.     Corrin Parker, M.D.  [facility]  Medical Director [place] Director [facility] Cardio-Pulmonary Department

## 2021-10-17 NOTE — Progress Notes (Signed)
PT Cancellation Note  Patient Details Name: Todd Spencer MRN: [id number] DOB: Mar 08, 1950   Cancelled Treatment:    Reason Eval/Treat Not Completed: Medical issues which prohibited therapy Chart reviewed, discussed case with nurse and OT (who had attempted) and all agree that he is not in a state to participate with PT this date.  Will maintain on caseload, continue to follow and attempt to eval/treat as appropriate.   Kreg Shropshire, DPT 10/17/2021, 4:13 PM

## 2021-10-17 NOTE — Consult Note (Signed)
PHARMACY - TOTAL PARENTERAL NUTRITION CONSULT NOTE   Indication: Prolonged ileus  Patient Measurements: Height: '5\' 11"'$  (180.3 cm) Weight: 99.7 kg (219 lb 12.8 oz) IBW/kg (Calculated) : 75.3   Body mass index is 30.66 kg/m.  Assessment:  Patient is a 71 y/o M with past medical history including CAD, ischemic cardiomyopathy, DM, HTN, CKD, OSA who presented to the ED 8/12 with abdominal pain, nausea, and vomiting. Imaging was concerning for mesenteric ischemia and patient was taken for emergent exploratory laparotomy with lysis of adhesions and no mesenteric ischemia noted. Patient was extubated 8/13. Post-operative course complicated by delirium, ongoing ileus. Given overall clinical picture, patient was re-intubated 8/15 and PICC was placed to start TPN 8/15. Did not tolerate tube feeds 8/20, TPN was stopped briefly and then restarted. Lurline Idol was placed on 8/25 and trickle feeds with full TPN continues.  Glucose / Insulin: History of DM (last Hgb A1c 7.1%). BG last 24h: 140-170s. Patient continues on an insulin gtt.  Electrolytes: Na 151>148 (likely slightly dry; on D5W gtt) K 3.4>3.3 -- (KCL IV 12mq q1hr x4; w/ 442m KCL PO x1) Phos 2.3>3.1 Renal: Scr 1.3-1.5 >>1-1.1 Hepatic: slight transaminitis. ALT 35>74 Intake / Output; MIVF: I&O: -2.6L; UOP 1.48m22m/h GI Imaging: 8/12 CT abdomen / pelvis: Concerning for acute mesenteric ischemia. Extensive pneumatosis within several loops of small bowel within the central abdomen with mucosal hypoenhancement. Extensive mesenteric and portal venous gas. Small volume pneumoperitoneum.Multiple dilated loops of small bowel without abrupt transition point. It is uncertain if this is secondary to bowel ischemia or mechanical obstruction. 8/13 CT abdomen / pelvis: Resolution of previously noted pneumatosis and portal venous gas portal venous gas. There are some persistent areas of small-bowel dilatation, with multiple ill-defined loops of small bowel and  multiple air-fluid levels. This may simply represent a mild postoperative ileus, although persistent partial small bowel obstruction is not entirely excluded. 8/23 XR Abd: Nonobstructive bowel gas pattern, with nasogastric tube to the Stomach 8/27 CT angio: there is a fistula now seen from the anterior wall of the transverse colon in this area, with air, feculent material and contrast extending to the proximal surgical incision. GI Surgeries / Procedures:  8/12: ExLap with lysis of adhesions. No mesenteric ischemia found  Central access: 8/15 TPN start date: 8/15  Nutritional Goals: Goal TPN rate is 80 mL/hr (provides 163.2 g of protein and 2304 kcals per day)  RD Assessment: Estimated Needs Total Energy Estimated Needs: 2200-2500kcal/day Total Protein Estimated Needs: 150-160g/day Total Fluid Estimated Needs: 2.0L/day  Current Nutrition:  Tube feeding titrating to goal of 55 mL/hr. Tube feeds were stopped d/t fistula/perf.    Plan:  -- Continues with TPN at 80 mL/hr to meet caloric needs as tube feeds remain trickle only today. Will need to watch fluid balance stop D5W as soon as sodium corrects (h/o CHF). -- Further insulin added to TPN given new D5W gtt, inc'd TPN, and large requirement off of insulin gtt . -- Triglycerides elevated at baseline, but have returned to baseline now. Propofol (now off) & hyperglycemia (now better controlled). SMOF has less effect, and triglycerides level on 8/29 improved to 255. Nutritional components Protein (as 15% Clinisol): 163.2 grams Dextrose: 15% Lipids (as 20% SMOFilpids): 67.2 grams kCal 2304 /24h 1920m57mfluid & D5W '@75ml'$ /h (1800ml65m--Electrolytes in TPN: Na 50mEq7mK 50mEq/67ma 5mEq/L,64m 5mEq/L, 348m Phos 15mmol/L.24mAc 1:1 --Add standard MVI and trace elements to TPN  --BG control: Continue with insulin gtt and increased 20>35 units insulin based on  prior requirement and with goal of getting insulin gtt d/c'd. - Lipids added back  to TPN as propofol stopped on 8/27. TG level 8/26 '462mg'$ /dL up from 289 on 8/24, 8/29 improved to 255 --Monitor TPN labs daily until stable  Shanon Brow Onya Eutsler 10/17/2021,8:45 AM

## 2021-10-17 NOTE — Progress Notes (Addendum)
Uneventful day. Total trach care done x 3 due to copious secretions. Patient did not work with PT ,only looked at them. Patient did not try to move self.  2+ edema all over body. Hourly blood sugar checks done and Insulin drip adjusted. Remains on trach collar and in SR/ST all day. Blood sodium down to 148 and hemoglobin up to 8.9.

## 2021-10-18 ENCOUNTER — Encounter: Payer: Self-pay | Admitting: Student in an Organized Health Care Education/Training Program

## 2021-10-18 DIAGNOSIS — R109 Unspecified abdominal pain: Secondary | ICD-10-CM | POA: Diagnosis not present

## 2021-10-18 DIAGNOSIS — K55059 Acute (reversible) ischemia of intestine, part and extent unspecified: Secondary | ICD-10-CM | POA: Diagnosis not present

## 2021-10-18 DIAGNOSIS — J9601 Acute respiratory failure with hypoxia: Secondary | ICD-10-CM | POA: Diagnosis not present

## 2021-10-18 DIAGNOSIS — K559 Vascular disorder of intestine, unspecified: Secondary | ICD-10-CM | POA: Diagnosis not present

## 2021-10-18 LAB — BASIC METABOLIC PANEL
Anion gap: 8 (ref 5–15)
BUN: 35 mg/dL — ABNORMAL HIGH (ref 8–23)
CO2: 20 mmol/L — ABNORMAL LOW (ref 22–32)
Calcium: 9.3 mg/dL (ref 8.9–10.3)
Chloride: 114 mmol/L — ABNORMAL HIGH (ref 98–111)
Creatinine, Ser: 0.97 mg/dL (ref 0.61–1.24)
GFR, Estimated: >60 mL/min (ref >60)
Glucose, Bld: 164 mg/dL — ABNORMAL HIGH (ref 70–99)
Potassium: 3.7 mmol/L (ref 3.5–5.1)
Sodium: 142 mmol/L (ref 135–145)

## 2021-10-18 LAB — CBC WITH DIFFERENTIAL/PLATELET
Abs Immature Granulocytes: 0.33 10*3/uL — ABNORMAL HIGH (ref 0.00–0.07)
Basophils Absolute: 0.1 10*3/uL (ref 0.0–0.1)
Basophils Relative: 1 %
Eosinophils Absolute: 0.3 10*3/uL (ref 0.0–0.5)
Eosinophils Relative: 3 %
HCT: 29 % — ABNORMAL LOW (ref 39.0–52.0)
Hemoglobin: 9.1 g/dL — ABNORMAL LOW (ref 13.0–17.0)
Immature Granulocytes: 3 %
Lymphocytes Relative: 7 %
Lymphs Abs: 0.8 10*3/uL (ref 0.7–4.0)
MCH: 29.5 pg (ref 26.0–34.0)
MCHC: 31.4 g/dL (ref 30.0–36.0)
MCV: 94.2 fL (ref 80.0–100.0)
Monocytes Absolute: 0.9 10*3/uL (ref 0.1–1.0)
Monocytes Relative: 8 %
Neutro Abs: 8.7 10*3/uL — ABNORMAL HIGH (ref 1.7–7.7)
Neutrophils Relative %: 78 %
Platelets: 204 10*3/uL (ref 150–400)
RBC: 3.08 MIL/uL — ABNORMAL LOW (ref 4.22–5.81)
RDW: 15.4 % (ref 11.5–15.5)
WBC: 11.1 10*3/uL — ABNORMAL HIGH (ref 4.0–10.5)
nRBC: 0.3 % — ABNORMAL HIGH (ref 0.0–0.2)

## 2021-10-18 LAB — GLUCOSE, CAPILLARY
Glucose-Capillary: 166 mg/dL — ABNORMAL HIGH (ref 70–99)
Glucose-Capillary: 167 mg/dL — ABNORMAL HIGH (ref 70–99)
Glucose-Capillary: 170 mg/dL — ABNORMAL HIGH (ref 70–99)
Glucose-Capillary: 175 mg/dL — ABNORMAL HIGH (ref 70–99)
Glucose-Capillary: 179 mg/dL — ABNORMAL HIGH (ref 70–99)
Glucose-Capillary: 189 mg/dL — ABNORMAL HIGH (ref 70–99)
Glucose-Capillary: 189 mg/dL — ABNORMAL HIGH (ref 70–99)

## 2021-10-18 LAB — HEMOGLOBIN AND HEMATOCRIT, BLOOD
HCT: 27.7 % — ABNORMAL LOW (ref 39.0–52.0)
Hemoglobin: 8.7 g/dL — ABNORMAL LOW (ref 13.0–17.0)

## 2021-10-18 LAB — PHOSPHORUS: Phosphorus: 3.5 mg/dL (ref 2.5–4.6)

## 2021-10-18 LAB — MAGNESIUM: Magnesium: 1.6 mg/dL — ABNORMAL LOW (ref 1.7–2.4)

## 2021-10-18 MED ORDER — HALOPERIDOL LACTATE 2 MG/ML PO CONC: 0.5000 mg | ORAL | Status: DC | PRN

## 2021-10-18 MED ORDER — GLYCOPYRROLATE 1 MG PO TABS: 1.0000 mg | ORAL_TABLET | ORAL | Status: DC | PRN

## 2021-10-18 MED ORDER — LORAZEPAM 2 MG/ML IJ SOLN
2.0000 mg | INTRAMUSCULAR | Status: DC | PRN
  Administered 2021-10-18: 2 mg via INTRAVENOUS
  Administered 2021-10-18: 4 mg via INTRAVENOUS
  Administered 2021-10-19: 2 mg via INTRAVENOUS
  Filled 2021-10-18: qty 1
  Filled 2021-10-18: qty 2
  Filled 2021-10-18: qty 1

## 2021-10-18 MED ORDER — HALOPERIDOL 0.5 MG PO TABS: 0.5000 mg | ORAL_TABLET | ORAL | Status: DC | PRN

## 2021-10-18 MED ORDER — POLYVINYL ALCOHOL 1.4 % OP SOLN: 1.0000 [drp] | Freq: Four times a day (QID) | OPHTHALMIC | Status: DC | PRN

## 2021-10-18 MED ORDER — MORPHINE 100MG IN NS 100ML (1MG/ML) PREMIX INFUSION: 5.0000 mg/h | INTRAVENOUS | Status: DC

## 2021-10-18 MED ORDER — HALOPERIDOL LACTATE 5 MG/ML IJ SOLN: 0.5000 mg | INTRAMUSCULAR | Status: DC | PRN

## 2021-10-18 MED ORDER — LORAZEPAM 1 MG PO TABS: 1.0000 mg | ORAL_TABLET | ORAL | Status: DC | PRN

## 2021-10-18 MED ORDER — ONDANSETRON HCL 4 MG/2ML IJ SOLN: 4.0000 mg | Freq: Four times a day (QID) | INTRAMUSCULAR | Status: DC | PRN

## 2021-10-18 MED ORDER — GLYCOPYRROLATE 0.2 MG/ML IJ SOLN
0.2000 mg | INTRAMUSCULAR | Status: DC | PRN
  Administered 2021-10-18 – 2021-10-19 (×3): 0.2 mg via INTRAVENOUS
  Filled 2021-10-18 (×3): qty 1

## 2021-10-18 MED ORDER — BIOTENE DRY MOUTH MT LIQD: 15.0000 mL | OROMUCOSAL | Status: DC | PRN

## 2021-10-18 MED ORDER — ACETAMINOPHEN 650 MG RE SUPP: 650.0000 mg | Freq: Four times a day (QID) | RECTAL | Status: DC | PRN

## 2021-10-18 MED ORDER — GLYCOPYRROLATE 0.2 MG/ML IJ SOLN: 0.2000 mg | INTRAMUSCULAR | Status: DC | PRN

## 2021-10-18 MED ORDER — MORPHINE BOLUS VIA INFUSION
4.0000 mg | INTRAVENOUS | Status: DC | PRN
  Administered 2021-10-18 (×2): 4 mg via INTRAVENOUS

## 2021-10-18 MED ORDER — ONDANSETRON 4 MG PO TBDP: 4.0000 mg | ORAL_TABLET | Freq: Four times a day (QID) | ORAL | Status: DC | PRN

## 2021-10-18 MED ORDER — ACETAMINOPHEN 325 MG PO TABS: 650.0000 mg | ORAL_TABLET | Freq: Four times a day (QID) | ORAL | Status: DC | PRN

## 2021-10-18 MED ORDER — LORAZEPAM 2 MG/ML PO CONC: 1.0000 mg | ORAL | Status: DC | PRN

## 2021-10-18 MED ORDER — MORPHINE 100MG IN NS 100ML (1MG/ML) PREMIX INFUSION
5.0000 mg/h | INTRAVENOUS | Status: DC
  Administered 2021-10-18 – 2021-10-19 (×3): 5 mg/h via INTRAVENOUS
  Filled 2021-10-18 (×3): qty 100

## 2021-10-18 NOTE — Progress Notes (Signed)
Met patient mother prayer and emotional support. Son made comfort care.

## 2021-10-18 NOTE — Progress Notes (Signed)
85 Family comforted with support from nurse,Dr.Kasa and Dr Ladonna Snide about making patient DNR/DNI and eventually comfort care later today. Of course family is distraught about decision but feel it is what the patient would want.

## 2021-10-18 NOTE — Progress Notes (Addendum)
[place] care started per orders. Mother and brother said their good byes and went home. 1041 Patient agitated and breathing 30 + times a minute. Medicated per orders.1049 Patient still agitated. Morphine drip increased to 6 mg/hr and then 8 mg/hr. 1105 Patient's breathing calmed and no anxiety noted.1200 Son Evelena Peat in to see patient. Very loud and unsteady walking. He left after visiting for 30 minutes.1218 Increased work of breathing noted. Given '4mg'$  Morphine bolus. 1517 Increased work of breathing noted. Given prn Ativan as ordered.

## 2021-10-18 NOTE — IPAL (Signed)
  Interdisciplinary Goals of Care Family Meeting   Date carried out: 10/18/2021  Location of the meeting: Bedside  Member's involved: Physician and Family Member or next of kin       GOALS OF CARE DISCUSSION  The Clinical status was relayed to family in detail- Mother and Cousin  Updated and notified of patients medical condition- Patient remains unresponsive and will not open eyes to command.   Patient is having a weak cough and struggling to remove secretions.   Patient with increased WOB and using accessory muscles to breathe Explained to family course of therapy and the modalities   Patient with Progressive multiorgan failure with a very high probablity of a very minimal chance of meaningful recovery despite all aggressive and optimal medical therapy.   Family understands the situation. Patient failure to wean from vent ABD catastrophe with EC fistula and  stool coming from wounds, +Aspiration pneumonia  They have consented and agreed to DNR status would like to proceed with Comfort care measures. Patient WOULD NOT WANT TO SUFFER LIKE THIS ANYMORE  Family are satisfied with Plan of action and management. All questions answered  Additional CC time 35 mins   Cydney Alvarenga Patricia Pesa, M.D.  [facility]  Medical Director [place] [facility] Cardio-Pulmonary Department

## 2021-10-18 NOTE — Progress Notes (Signed)
PT Cancellation Note  Patient Details Name: Todd Spencer MRN: [id number] DOB: 1951/01/02   Cancelled Treatment:    Reason Eval/Treat Not Completed: Medical issues which prohibited therapy Pt placed on comfort care this AM, discussed with MD who confirms no PT needs at this time.  Will sign off and complete PT orders.  Kreg Shropshire, DPT 10/18/2021, 12:19 PM

## 2021-10-18 NOTE — Progress Notes (Signed)
   10/18/21 1200  Clinical Encounter Type  Visited With Patient and family together  Visit Type Initial  Spiritual Encounters  Spiritual Needs Prayer;Grief support   Chaplain provided care to son of patient through reflective listening and sharing meaningful life stories.

## 2021-10-18 NOTE — Progress Notes (Signed)
Nutrition Brief Note  Chart reviewed. Pt now transitioning to comfort care.  No further nutrition interventions planned at this time.  Please re-consult as needed.   Lorenza Shakir W, RD, LDN, CDCES Registered Dietitian II Certified Diabetes Care and Education Specialist Please refer to [facility] for RD and/or RD on-call/weekend/after hours pager   

## 2021-10-18 NOTE — Progress Notes (Signed)
Removed patient from vent and placed on room air for comfort measures, trach cuff deflated.

## 2021-10-18 NOTE — Progress Notes (Signed)
NAME:  Todd Spencer, MRN:  [id number], DOB:  [birth year], LOS: 9 ADMISSION DATE:  09/29/2021, CONSULTATION DATE: 10/09/2021  History of Present Illness/SYNOPSIS  71 year old male with a history of CAD, ischemic cardiomyopathy, type 2 diabetes, HTN, CKD, and OSA who presented to the ED with abdominal pain, nausea and vomiting. CT scan of the abdomen was performed showing portal venous and mesenteric gas, highly concerning for mesenteric ischemia.  He was started on antibiotics and taken emergently to the OR by Dr. Christian Mate for an exploratory laparotomy. History was mostly obtained by chart review as the patient was transferred to the ICU intubated following his procedure.   Dr. Christian Mate noted multiple adhesions in the abdomen which were excised and lysed. A mild non-compromised transition area was noted, with the small bowel appearing healthy and well perfused, with no signs of discoloration pneumatosis or perforation. Similarly, the colon was healthy appearing. Following the procedure, he was transferred to the ICU   FAILURE TO [place] S/P TRACH +ENTEROCUTANEOUS FISTULA    Pertinent  Medical History  CAD s/p stenting to his RCA and LAD HFrEF, ischemic cardiomyopathy HTN HLD T2DM OSA on CPAP  Significant Hospital Events: Including procedures, antibiotic start and stop dates in addition to other pertinent events   10/11/2021: Exploratory laparotomy with lysis of adhesions, no       mesenteric ischemia noted 09/20/2021: Overnight pt with 450 ml of bloody OGT concerning       for possible GIB and worsening hyperkalemia  10/14/2021: Pt extubated to [place]  10/01/2021: CT Chest/Abd/Pelvis revealed Resolution of previously noted pneumatosis and portal venous gas portal venous gas. There are some persistent areas of small-bowel dilatation, with multiple ill-defined loops of small bowel and multiple air-fluid levels. This may simply represent a mild postoperative ileus, although  persistent partial small bowel obstruction is not entirely excluded. Trace volume of ascites. Dependent opacities in the lung bases bilaterally favored to predominantly reflect areas of subsegmental atelectasis and/or chronic scarring, although a small amount of airspace consolidation is not excluded in the posterior aspect of the right lower lobe. Aortic atherosclerosis, in addition to left main and three-vessel coronary artery disease. Assessment for potential risk factor modification, dietary therapy or pharmacologic therapy may be warranted, if clinically indicated. Additional incidental findings, as above. 10/02/2021: Pt developed worsening delirium/agitation overnight requiring precedex gtt.  Pt lethargic this am but able to follow commands with no signs of agitation.  Will attempt to wean off Bipap to HFNC once mentation improves.  Minimal NGT output overnight  10/03/2021- Trickle feeds initiated on 08/14 '@10'$  ml/hr.  However, overnight he pulled the NGT out.  This am he is having worsening abdominal distension and pain. KUB revealed ileus pattern with slightly progressing bowel gas pattern.  Orders placed to reinsert NGT once placement confirmed will place to LIS.  Intubated to allow time for ileus to resolve (see discussion 10/03/21 note) 8/18 remains on vent, ABD distended, failure to wean from vent 8/19 remains on vent, ABD distended, tolerated TF's 8/20 DID NOT TOLERATE TUBE FEEDS 8/21 severe hypoxia, possible mucus plugs 8/22 tolerating feeds family to come to bedside today 8/23 failure to wean from vent, ENT consulted for [facility] 8/25 [facility] 8/26 wound drainage, CT w/ small fistula 8/27 remains on vent, remains critically ill 8/28 on TCT, +mod distress 8/30 increased WOB, increased secertions  Interim History / Subjective:  Generalized weakness +severe resp distress +tracheal secretions back on vent PS mode +ileus and enterocutaneous fistula Failure  to wean from vent S/p TRACH Prognosis  is poor       Latest Ref Rng & Units 10/17/2021    5:27 AM 10/16/2021    2:55 PM 10/16/2021    4:00 AM  BMP  Glucose 70 - 99 mg/dL 181   175   BUN 8 - 23 mg/dL 43   48   Creatinine 0.61 - 1.24 mg/dL 1.06   1.09   Sodium 135 - 145 mmol/L 148   151   Potassium 3.5 - 5.1 mmol/L 3.3  3.4  3.3   Chloride 98 - 111 mmol/L 121   119   CO2 22 - 32 mmol/L 24   25   Calcium 8.9 - 10.3 mg/dL 9.2   9.0         Objective   Blood pressure (!) 145/82, pulse 100, temperature 99.9 F (37.7 C), temperature source Axillary, resp. rate (!) 22, height '5\' 11"'$  (1.803 m), weight 99.8 kg, SpO2 98 %.    Vent Mode: PCV;CPAP FiO2 (%):  [25 %-28 %] 28 % PEEP:  [5 cmH20] 5 cmH20 Pressure Support:  [5 cmH20] 5 cmH20   Intake/Output Summary (Last 24 hours) at 10/18/2021 0718 Last data filed at 10/18/2021 0600 Gross per 24 hour  Intake 4814.63 ml  Output 2670 ml  Net 2144.63 ml    Filed Weights   10/16/21 0429 10/17/21 0415 10/18/21 0500  Weight: 99.8 kg 99.7 kg 99.8 kg      REVIEW OF SYSTEMS  PATIENT IS UNABLE TO PROVIDE COMPLETE REVIEW OF SYSTEMS DUE TO SEVERE CRITICAL ILLNESS    PHYSICAL EXAMINATION:  GENERAL:critically ill appearing, +resp distress EYES: Pupils equal, round, reactive to light.  No scleral icterus.  MOUTH: Moist mucosal membrane. S/p trach NECK: Supple.  PULMONARY: +rhonchi, +wheezing CARDIOVASCULAR: S1 and S2.  No murmurs  GASTROINTESTINAL: Wound partially open in the upper portion.  There is drainage c/w colo-cutaneous fistula coming from the very cephalad area of the wound. MUSCULOSKELETAL: edema.  NEUROLOGIC: obtunded    Assessment & Plan:   71 yo obese WM with  s/p exploratory laparotomy and LOA for CT findings concerning for SBO, pneumatosis, and pneumoperitoneum anticipated post-operative ileus associated with severe hypoxic/hypercapnic resp failure and failure to wean from vent with enterocutaneous fistula  Severe ACUTE Hypoxic and Hypercapnic Respiratory  Failure Failure to wean from vent today lots of ssecretions Obtain sputum cultures Placed back on VENT support -continue Mechanical Ventilator support -Wean Fio2 and PEEP as tolerated -VAP/VENT bundle implementation - Wean PEEP & FiO2 as tolerated, maintain SpO2 > 88% - Head of bed elevated 30 degrees, VAP protocol in place - Plateau pressures less than 30 cm H20  - Intermittent chest x-ray & ABG PRN - Ensure adequate pulmonary hygiene    Postoperative hypoxemic respiratory failure- due risk of aspiration (see discussion 10/03/21), decision made to keep on mechanical ventilation until return of bowel function- +ENTEROCUTANEOUS FISTULA CONTINUE TPN GEN SURGERY RECOMMENDS TRICKLE FEEDS Drainage from wound EC fistula Zosyn started 8/26 Conservative treatment planned at this time   NEUROLOGY ACUTE METABOLIC ENCEPHALOPATHY Critical illness polyneuropathy Off all sedation at this time  RENAL -continue Foley Catheter-assess need -Avoid nephrotoxic agents -Follow urine output, BMP -Ensure adequate renal perfusion, optimize oxygenation -Renal dose medications   Intake/Output Summary (Last 24 hours) at 10/18/2021 0721 Last data filed at 10/18/2021 0600 Gross per 24 hour  Intake 4814.63 ml  Output 2670 ml  Net 2144.63 ml     ACUTE ANEMIA- TRANSFUSE AS NEEDED CONSIDER  TRANSFUSION  IF HGB<7   ENDO - ICU hypoglycemic\Hyperglycemia protocol -check FSBS per protocol   GI GI PROPHYLAXIS as indicated  NUTRITIONAL STATUS DIET-->TF's and TPN Constipation protocol as indicated   ELECTROLYTES -follow labs as needed -replace as needed -pharmacy consultation and following Diet/type: TPN DVT prophylaxis: prophylactic heparin  GI prophylaxis: PPI Lines: PICC Foley:  Yes, and it is still needed: keep for a few more days     DVT/GI PRX  assessed I Assessed the need for Labs I Assessed the need for Foley I Assessed the need for Central Venous Line Family Discussion  when available I Assessed the need for Mobilization I made an Assessment of medications to be adjusted accordingly Safety Risk assessment completed  CASE DISCUSSED IN MULTIDISCIPLINARY ROUNDS WITH ICU TEAM     Critical Care Time devoted to patient care services described in this note is 45  minutes.  Critical care was necessary to treat /prevent imminent and life-threatening deterioration. Overall, patient is critically ill, prognosis is guarded.  Patient with Multiorgan failure and at high risk for cardiac arrest and death.    Corrin Parker, M.D.  [facility]  Medical Director [place] [facility] Cardio-Pulmonary Department

## 2021-10-19 MED ORDER — SCOPOLAMINE 1 MG/3DAYS TD PT72
1.0000 | MEDICATED_PATCH | TRANSDERMAL | Status: DC
  Administered 2021-10-19: 1.5 mg via TRANSDERMAL
  Filled 2021-10-19: qty 1

## 2021-10-19 NOTE — Care Management Important Message (Signed)
Important Message  Patient Details  Name: KENDRICK HAAPALA MRN: [id number] Date of Birth: 04/14/1950   Medicare Important Message Given:  Other (see comment)  Patient placed on comfort care and out of respect for the patient and family no Important Message from [facility] given.  Juliann Pulse A Salvadore Valvano 10/19/2021, 10:42 AM

## 2021-10-19 NOTE — Plan of Care (Signed)
  Problem: Education: Goal: Knowledge of General Education information will improve Description: Including pain rating scale, medication(s)/side effects and non-pharmacologic comfort measures Outcome: Not Applicable   Problem: Health Behavior/Discharge Planning: Goal: Ability to manage health-related needs will improve Outcome: Not Applicable   Problem: Clinical Measurements: Goal: Ability to maintain clinical measurements within normal limits will improve Outcome: Not Applicable Goal: Will remain free from infection Outcome: Not Applicable Goal: Diagnostic test results will improve Outcome: Not Applicable Goal: Respiratory complications will improve Outcome: Not Applicable Goal: Cardiovascular complication will be avoided Outcome: Not Applicable   Problem: Activity: Goal: Risk for activity intolerance will decrease Outcome: Not Applicable   Problem: Nutrition: Goal: Adequate nutrition will be maintained Outcome: Not Applicable   Problem: Coping: Goal: Level of anxiety will decrease Outcome: Not Applicable   Problem: Elimination: Goal: Will not experience complications related to bowel motility Outcome: Not Applicable Goal: Will not experience complications related to urinary retention Outcome: Not Applicable   Problem: Pain Managment: Goal: General experience of comfort will improve Outcome: Not Applicable   Problem: Safety: Goal: Ability to remain free from injury will improve Outcome: Not Applicable   Problem: Skin Integrity: Goal: Risk for impaired skin integrity will decrease Outcome: Not Applicable   Problem: Coping: Goal: Ability to adjust to condition or change in health will improve Outcome: Not Applicable   Problem: Fluid Volume: Goal: Ability to maintain a balanced intake and output will improve Outcome: Not Applicable   Problem: Health Behavior/Discharge Planning: Goal: Ability to identify and utilize available resources and services will  improve Outcome: Not Applicable Goal: Ability to manage health-related needs will improve Outcome: Not Applicable   Problem: Metabolic: Goal: Ability to maintain appropriate glucose levels will improve Outcome: Not Applicable   Problem: Nutritional: Goal: Maintenance of adequate nutrition will improve Outcome: Not Applicable Goal: Progress toward achieving an optimal weight will improve Outcome: Not Applicable   Problem: Skin Integrity: Goal: Risk for impaired skin integrity will decrease Outcome: Not Applicable   Problem: Tissue Perfusion: Goal: Adequacy of tissue perfusion will improve Outcome: Not Applicable   Patient on comfort care

## 2021-10-19 NOTE — Progress Notes (Signed)
NAME:  Todd Spencer, MRN:  [id number], DOB:  Jan 25, 1951, LOS: 34 ADMISSION DATE:  10/03/2021, CONSULTATION DATE: 09/21/2021  History of Present Illness/SYNOPSIS  71 year old male with a history of CAD, ischemic cardiomyopathy, type 2 diabetes, HTN, CKD, and OSA who presented to the ED with abdominal pain, nausea and vomiting. CT scan of the abdomen was performed showing portal venous and mesenteric gas, highly concerning for mesenteric ischemia.  He was started on antibiotics and taken emergently to the OR by Dr. Christian Mate for an exploratory laparotomy. History was mostly obtained by chart review as the patient was transferred to the ICU intubated following his procedure.   Dr. Christian Mate noted multiple adhesions in the abdomen which were excised and lysed. A mild non-compromised transition area was noted, with the small bowel appearing healthy and well perfused, with no signs of discoloration pneumatosis or perforation. Similarly, the colon was healthy appearing. Following the procedure, he was transferred to the ICU   NOW COMFORT CARE RESTING COMFORTABLY ON MORPHINE GTT    Pertinent  Medical History  CAD s/p stenting to his RCA and LAD HFrEF, ischemic cardiomyopathy HTN HLD T2DM OSA on CPAP  Significant Hospital Events: Including procedures, antibiotic start and stop dates in addition to other pertinent events   10/16/2021: Exploratory laparotomy with lysis of adhesions, no       mesenteric ischemia noted 10/14/2021: Overnight pt with 450 ml of bloody OGT concerning       for possible GIB and worsening hyperkalemia  10/02/2021: Pt extubated to [place]  10/01/2021: CT Chest/Abd/Pelvis revealed Resolution of previously noted pneumatosis and portal venous gas portal venous gas. There are some persistent areas of small-bowel dilatation, with multiple ill-defined loops of small bowel and multiple air-fluid levels. This may simply represent a mild postoperative ileus, although persistent  partial small bowel obstruction is not entirely excluded. Trace volume of ascites. Dependent opacities in the lung bases bilaterally favored to predominantly reflect areas of subsegmental atelectasis and/or chronic scarring, although a small amount of airspace consolidation is not excluded in the posterior aspect of the right lower lobe. Aortic atherosclerosis, in addition to left main and three-vessel coronary artery disease. Assessment for potential risk factor modification, dietary therapy or pharmacologic therapy may be warranted, if clinically indicated. Additional incidental findings, as above. 10/02/2021: Pt developed worsening delirium/agitation overnight requiring precedex gtt.  Pt lethargic this am but able to follow commands with no signs of agitation.  Will attempt to wean off Bipap to HFNC once mentation improves.  Minimal NGT output overnight  10/03/2021- Trickle feeds initiated on 08/14 '@10'$  ml/hr.  However, overnight he pulled the NGT out.  This am he is having worsening abdominal distension and pain. KUB revealed ileus pattern with slightly progressing bowel gas pattern.  Orders placed to reinsert NGT once placement confirmed will place to LIS.  Intubated to allow time for ileus to resolve (see discussion 10/03/21 note) 8/18 remains on vent, ABD distended, failure to wean from vent 8/19 remains on vent, ABD distended, tolerated TF's 8/20 DID NOT TOLERATE TUBE FEEDS 8/21 severe hypoxia, possible mucus plugs 8/22 tolerating feeds family to come to bedside today 8/23 failure to wean from vent, ENT consulted for [facility] 8/25 [facility] 8/26 wound drainage, CT w/ small fistula 8/27 remains on vent, remains critically ill 8/28 on TCT, +mod distress 8/30 increased WOB, increased secretions. Transitioned to COMFORT CARE 8/31: Resting comfortably on morphine gtt.  Consult Palliative Care to assist with possible hospice home vs home with hospice.  Interim History / Subjective:  -Pt made comfort care  yesterday -Resting comfortably on morphine gtt, in no distress -Moderate amount of secretions from trach ~ will order Glycopyrrolate prn and scopolamine patch -Anticipate pt may linger a few more days, will consult palliative care      Latest Ref Rng & Units 10/18/2021    4:56 AM 10/17/2021    5:27 AM 10/16/2021    2:55 PM  BMP  Glucose 70 - 99 mg/dL 164  181    BUN 8 - 23 mg/dL 35  43    Creatinine 0.61 - 1.24 mg/dL 0.97  1.06    Sodium 135 - 145 mmol/L 142  148    Potassium 3.5 - 5.1 mmol/L 3.7  3.3  3.4   Chloride 98 - 111 mmol/L 114  121    CO2 22 - 32 mmol/L 20  24    Calcium 8.9 - 10.3 mg/dL 9.3  9.2          Objective   Blood pressure 137/75, pulse (!) 106, temperature 100 F (37.8 C), resp. rate 12, height '5\' 11"'$  (1.803 m), weight 99.8 kg, SpO2 (!) 80 %.        Intake/Output Summary (Last 24 hours) at 10/19/2021 1408 Last data filed at 10/18/2021 2002 Gross per 24 hour  Intake 0 ml  Output 0 ml  Net 0 ml    Filed Weights   10/16/21 0429 10/17/21 0415 10/18/21 0500  Weight: 99.8 kg 99.7 kg 99.8 kg      REVIEW OF SYSTEMS  PATIENT IS UNABLE TO PROVIDE COMPLETE REVIEW OF SYSTEMS DUE TO SEVERE CRITICAL ILLNESS    PHYSICAL EXAMINATION:  GENERAL: Acute ill appearing male, laying in bed, on morphine gtt, in NAD EYES: Pupils equal, round, reactive to light.  No scleral icterus.  MOUTH: Dry mucosal membranes. NECK: Supple, s/p trach PULMONARY: +rhonchi, even, nonlabored CARDIOVASCULAR: S1 and S2.  No murmurs  GASTROINTESTINAL: Wound partially open in the upper portion.  There is drainage c/w colo-cutaneous fistula coming from the very cephalad area of the wound. MUSCULOSKELETAL: edema.  NEUROLOGIC: sedated on morphine drip, will open eyes to gentle stimulation, but doesn't follow commands    Assessment & Plan:   71 yo obese WM with  s/p exploratory laparotomy and LOA for CT findings concerning for SBO, pneumatosis, and pneumoperitoneum anticipated  post-operative ileus associated with severe hypoxic/hypercapnic resp failure and failure to wean from vent with enterocutaneous fistula  Pt now COMFORT CARE -Morphine drip for pain/air hunger/dyspnea -Prn Anxiolytic and prn Haldol -Prn Robinul for secretions, will order scopolamine patch -Consult Palliative Care for potential hospice home vs home with hospice -Transfer to [place] Time: 30 minutes  Darel Hong, AGACNP-BC [facility] Prefer epic messenger for cross cover needs If after hours, please call E-link

## 2021-10-20 DEATH — deceased

## 2021-10-24 ENCOUNTER — Encounter: Payer: Self-pay | Admitting: Otolaryngology

## 2021-11-01 LAB — BLOOD GAS, ARTERIAL
Acid-base deficit: 0.1 mmol/L (ref 0.0–2.0)
Acid-base deficit: 1.7 mmol/L (ref 0.0–2.0)
Bicarbonate: 25.2 mmol/L (ref 20.0–28.0)
Bicarbonate: 25.4 mmol/L (ref 20.0–28.0)
Delivery systems: POSITIVE
Expiratory PAP: 8 cmH2O
Expiratory PAP: 8 cmH2O
FIO2: 40 %
FIO2: 40 %
Inspiratory PAP: 16 cmH2O
Inspiratory PAP: 16 cmH2O
O2 Saturation: 99 %
O2 Saturation: 99.6 %
Patient temperature: 37
Patient temperature: 37
RATE: 10 resp/min
pCO2 arterial: 44 mmHg (ref 32–48)
pCO2 arterial: 50 mmHg — ABNORMAL HIGH (ref 32–48)
pH, Arterial: 7.31 — ABNORMAL LOW (ref 7.35–7.45)
pH, Arterial: 7.37 (ref 7.35–7.45)
pO2, Arterial: 110 mmHg — ABNORMAL HIGH (ref 83–108)
pO2, Arterial: 98 mmHg (ref 83–108)

## 2021-11-15 ENCOUNTER — Ambulatory Visit: Admit: 2021-11-15 | Payer: Medicare HMO | Admitting: Internal Medicine

## 2021-11-15 SURGERY — COLONOSCOPY
Anesthesia: General

## 2021-11-19 NOTE — Death Summary Note (Signed)
DEATH SUMMARY   Patient Details  Name: Todd Spencer MRN: [id number] DOB: April 07, 1950  Admission/Discharge Information   Admit Date:  2021-10-03  Date of Death: Date of Death: 2021-10-23  Time of Death: Time of Death: 0555  Length of Stay: 05/12/22  Referring Physician: Maryland Pink, MD   Reason(s) for Hospitalization  SBO, EC FISTULA, RESP FAILURE ASPIRATION PNEUMONIA  Diagnoses  Preliminary cause of death: SBO, EC FISTULA, RESP FAILURE ASPIRATION PNEUMONIA Secondary Diagnoses (including complications and co-morbidities):  Active Problems:   DM2 (diabetes mellitus, type 2) (HCC)   Bowel obstruction (HCC)   Acute respiratory failure with hypoxia and hypercapnia (HCC)   Hyperkalemia   Abdominal pain   Brief Hospital Course (including significant findings, care, treatment, and services provided and events leading to death)   History of Present Illness/SYNOPSIS  71 year old male with a history of CAD, ischemic cardiomyopathy, type 2 diabetes, HTN, CKD, and OSA who presented to the ED with abdominal pain, nausea and vomiting. CT scan of the abdomen was performed showing portal venous and mesenteric gas, highly concerning for mesenteric ischemia.   He was started on antibiotics and taken emergently to the OR by Dr. Christian Mate for an exploratory laparotomy. History was mostly obtained by chart review as the patient was transferred to the ICU intubated following his procedure.   Dr. Christian Mate noted multiple adhesions in the abdomen which were excised and lysed. A mild non-compromised transition area was noted, with the small bowel appearing healthy and well perfused, with no signs of discoloration pneumatosis or perforation. Similarly, the colon was healthy appearing. Following the procedure, he was transferred to the ICU    FAILURE TO Erhard S/P TRACH +ENTEROCUTANEOUS FISTULA      Pertinent  Medical History  CAD s/p stenting to his RCA and LAD HFrEF, ischemic  cardiomyopathy HTN HLD T2DM OSA on CPAP   Significant Hospital Events: Including procedures, antibiotic start and stop dates in addition to other pertinent events   10/03/2021: Exploratory laparotomy with lysis of adhesions, no       mesenteric ischemia noted 10/03/21: Overnight pt with 450 ml of bloody OGT concerning       for possible GIB and worsening hyperkalemia  03-Oct-2021: Pt extubated to [place]  10/01/2021: CT Chest/Abd/Pelvis revealed Resolution of previously noted pneumatosis and portal venous gas portal venous gas. There are some persistent areas of small-bowel dilatation, with multiple ill-defined loops of small bowel and multiple air-fluid levels. This may simply represent a mild postoperative ileus, although persistent partial small bowel obstruction is not entirely excluded. Trace volume of ascites. Dependent opacities in the lung bases bilaterally favored to predominantly reflect areas of subsegmental atelectasis and/or chronic scarring, although a small amount of airspace consolidation is not excluded in the posterior aspect of the right lower lobe. Aortic atherosclerosis, in addition to left main and three-vessel coronary artery disease. Assessment for potential risk factor modification, dietary therapy or pharmacologic therapy may be warranted, if clinically indicated. Additional incidental findings, as above. 10/02/2021: Pt developed worsening delirium/agitation overnight requiring precedex gtt.  Pt lethargic this am but able to follow commands with no signs of agitation.  Will attempt to wean off Bipap to HFNC once mentation improves.  Minimal NGT output overnight  10/03/2021- Trickle feeds initiated on 08/14 '@10'$  ml/hr.  However, overnight he pulled the NGT out.  This am he is having worsening abdominal distension and pain. KUB revealed ileus pattern with slightly progressing bowel gas pattern.  Orders placed to reinsert  NGT once placement confirmed will place to LIS.  Intubated to  allow time for ileus to resolve (see discussion 10/03/21 note) 8/18 remains on vent, ABD distended, failure to wean from vent 8/19 remains on vent, ABD distended, tolerated TF's 8/20 DID NOT TOLERATE TUBE FEEDS 8/21 severe hypoxia, possible mucus plugs 8/22 tolerating feeds family to come to bedside today 8/23 failure to wean from vent, ENT consulted for [facility] 8/25 [facility] 8/26 wound drainage, CT w/ small fistula 8/27 remains on vent, remains critically ill 8/28 on TCT, +mod distress 8/30 increased WOB, increased secertions   Interim History / Subjective:  Generalized weakness +severe resp distress +tracheal secretions back on vent PS mode +ileus and enterocutaneous fistula Failure to wean from vent S/p TRACH Prognosis is poor  Severe ACUTE Hypoxic and Hypercapnic Respiratory Failure Failure to wean from vent today lots of ssecretions Obtain sputum cultures Placed back on VENT support -continue Mechanical Ventilator support -Wean Fio2 and PEEP as tolerated -VAP/VENT bundle implementation - Wean PEEP & FiO2 as tolerated, maintain SpO2 > 88% - Head of bed elevated 30 degrees, VAP protocol in place - Plateau pressures less than 30 cm H20  - Intermittent chest x-ray & ABG PRN - Ensure adequate pulmonary hygiene      Postoperative hypoxemic respiratory failure- due risk of aspiration (see discussion 10/03/21), decision made to keep on mechanical ventilation until return of bowel function- +ENTEROCUTANEOUS FISTULA CONTINUE TPN GEN SURGERY RECOMMENDS TRICKLE FEEDS Drainage from wound EC fistula Zosyn started 8/26 Conservative treatment planned at this time     NEUROLOGY ACUTE METABOLIC ENCEPHALOPATHY Critical illness polyneuropathy Off all sedation at this time   RENAL -continue Foley Catheter-assess need -Avoid nephrotoxic agents -Follow urine output, BMP -Ensure adequate renal perfusion, optimize oxygenation -Renal dose medications     Intake/Output Summary (Last 24  hours) at 10/18/2021 0721 Last data filed at 10/18/2021 0600    Gross per 24 hour  Intake 4814.63 ml  Output 2670 ml  Net 2144.63 ml        ACUTE ANEMIA- TRANSFUSE AS NEEDED CONSIDER TRANSFUSION  IF HGB<7     ENDO - ICU hypoglycemic\Hyperglycemia protocol -check FSBS per protocol     GI GI PROPHYLAXIS as indicated   NUTRITIONAL STATUS DIET-->TF's and TPN Constipation protocol as indicated        GOALS OF CARE DISCUSSION   The Clinical status was relayed to family in detail- Mother and Cousin   Updated and notified of patients medical condition- Patient remains unresponsive and will not open eyes to command.   Patient is having a weak cough and struggling to remove secretions.   Patient with increased WOB and using accessory muscles to breathe Explained to family course of therapy and the modalities    Patient with Progressive multiorgan failure with a very high probablity of a very minimal chance of meaningful recovery despite all aggressive and optimal medical therapy.    Family understands the situation. Patient failure to wean from vent ABD catastrophe with EC fistula and  stool coming from wounds, +Aspiration pneumonia   They have consented and agreed to DNR status would like to proceed with Comfort care measures. Patient WOULD NOT WANT TO SUFFER LIKE THIS ANYMORE   Family are satisfied with Plan of action and management. All questions answered    Pertinent Labs and Studies  Significant Diagnostic Studies CT ANGIO GI BLEED  Result Date: 10/15/2021 CLINICAL DATA:  Rectal bleeding with a suspected diverticular bleed. EXAM: CTA ABDOMEN AND PELVIS WITHOUT AND  WITH CONTRAST TECHNIQUE: Multidetector CT imaging of the abdomen and pelvis was performed using the standard protocol during bolus administration of intravenous contrast. Multiplanar reconstructed images and MIPs were obtained and reviewed to evaluate the vascular anatomy. RADIATION DOSE REDUCTION: This exam  was performed according to the departmental dose-optimization program which includes automated exposure control, adjustment of the mA and/or kV according to patient size and/or use of iterative reconstruction technique. CONTRAST:  134m OMNIPAQUE IOHEXOL 350 MG/ML SOLN COMPARISON:  CT abdomen with IV and oral contrast yesterday 5:01 p.m., CT chest, abdomen and pelvis without contrast 10/01/2021, and CT with contrast 10/04/2021. FINDINGS: VASCULAR Aorta: There is moderate to heavy patchy calcific wall plaque. There is no aneurysm, stenosis, dissection penetrating ulcer. Celiac: Widely patent with only trace calcific plaques. SMA: Widely patent with mild nonstenosing ostial calcifications. Renals: Widely patent with nonstenosing ostial calcific plaques on the left. IMA: High-grade calcific origin stenosis.  Otherwise opacifies well. Inflow: There is moderate patchy calcification of the common iliac arteries without stenosis. There are mild scattered calcifications in the external iliac arteries without stenosis. There are moderate calcifications in the internal iliac arteries without stenosis. Proximal Outflow: There are moderate calcifications of the posterior wall of the common femoral arteries without stenosis, minimal calcification in the proximal superficial femoral arteries without stenosis. The proximal profunda and circumflex femoral arteries are clear. Veins: Patent normal caliber portal vein, SMV and splenic vein, IVC and pelvic deep veins, and common femoral veins. Review of the MIP images confirms the above findings. NON-VASCULAR Lower chest: There is stable atelectasis in lower lobes. Lung bases are otherwise clear. The LAD and right coronary artery are heavily calcified. The cardiac size is normal. No pericardial effusion Hepatobiliary: 20 cm length slightly steatotic liver. Small cyst in the dome of the left lobe is chronic and unchanged. There is no mass enhancement. Surgically absent gallbladder  without biliary dilatation. Portions of the left lobe of the liver have a nodular surface contour consistent with at least mild underlying cirrhosis. Pancreas: No focal abnormality. Spleen: No focal abnormality. Mildly prominent, measures 13.9 cm AP. Adrenals/Urinary Tract: There is no adrenal mass. There are stable small renal cysts and subcentimeter cortical hypodensities. There is no urinary stone or obstruction. Unremarkable bladder. Stomach/Bowel: According to the information available in epic, lysis of adhesions was performed August 12 with exploratory laparotomy. As seen on yesterday's study, there is an apparently contained perforation underlying the upper aspect of the laparotomy incision, in the mid transverse colon right of the midline anteriorly on 7: 54-59, with trace extravasated contrast. There are inflammatory changes in the area but no free air seen elsewhere or other extravasated contrast. This was first noted on yesterday's CT and has not significantly changed. Today there does appear to be a fistula forming from the ventral-wall-adherent anterior wall of the transverse colon in this area to the upper surgical incision and this is best seen on 7: 47-50. A separate, blind ending fistula extends from the anterior wall of transverse colon towards the abdominal wall and was noted yesterday as well, on 7: 57-60. Enteric catheter with radiopaque tip is seen with tip in the gastric antrum. There is mild gastric air distention without wall thickening. There is increased dilatation mid to lower abdominal small bowel segments up to 4.2 cm caliber, without visible transition could be due to ileus or partial small bowel obstruction. An appendix is not seen in this patient. The rest of the large bowel wall remarkable for diverticula along the left  colon but no focal acute process. Rectal tube has been reinserted since yesterday. Please note there is contrast throughout the colon and arterial or venous  extravasation into the colon cannot be assessed. Lymphatic: No adenopathy. Reproductive: Enlarged prostate, 5.1 cm transverse, mild bladder impression. Other: Small inguinal fat hernias.  No incarcerated hernia. Musculoskeletal: Mild osteopenia and degenerative change of the spine. IMPRESSION: VASCULAR 1. There is contrast in the colon. Assessment for arterial or venous extravasation into the colon cannot be made. 2. Aortic and branch vessel atherosclerosis. 3. High-grade IMA origin stenosis, but no other visceral artery stenoses are seen. 4. No significant inflow or proximal outflow stenosis. 5. Heavily calcified LAD and right coronary arteries. NON-VASCULAR 1. A contained perforation is again noted of the mid transverse colon right of midline anteriorly, and a small amount of contrast extravasation. Appearance is similar with yesterday's exam. 2. Newly noted however, there is a fistula now seen from the anterior wall of the transverse colon in this area, with air, feculent material and contrast extending to the proximal surgical incision. 3. A blind-ending fistula continues to be seen extending towards the abdominal wall below this level. 4. Increased small-bowel dilatation today in the mid to lower abdomen consistent with ileus or partial small bowel obstruction. A transitional segment not identified. 5. In all other respects there are no further changes. Electronically Signed   By: Telford Nab M.D.   On: 10/15/2021 22:49   CT HEAD WO CONTRAST (5MM)  Result Date: 10/14/2021 CLINICAL DATA:  Status change. Unknown cause. EXAM: CT HEAD WITHOUT CONTRAST TECHNIQUE: Contiguous axial images were obtained from the base of the skull through the vertex without intravenous contrast. RADIATION DOSE REDUCTION: This exam was performed according to the departmental dose-optimization program which includes automated exposure control, adjustment of the mA and/or kV according to patient size and/or use of iterative  reconstruction technique. COMPARISON:  CT head without contrast 09/20/2017 FINDINGS: Brain: No acute infarct, hemorrhage, or mass lesion is present. No significant white matter lesions are present. Prominent CSF space medial to the anterior right frontal lobe is stable. This likely represents focal hygroma or arachnoid cyst. No other significant extra-axial fluid collection is present. The ventricles are of normal size. The brainstem and cerebellum are within normal limits. Vascular: Atherosclerotic calcifications are present within the cavernous internal carotid arteries bilaterally. Hyperdense vessel is present. Skull: Calvarium is intact. No focal lytic or blastic lesions are present. No significant extracranial soft tissue lesion is present. Sinuses/Orbits: Left-sided NG tube is in place. Minimal mucosal thickening is present in the right maxillary sinus. The paranasal sinuses and mastoid air cells are otherwise clear. Bilateral lens replacements are noted. Globes and orbits are otherwise unremarkable. IMPRESSION: 1. No acute intracranial abnormality or significant interval change. 2. Stable prominence of CSF space medial to the anterior right frontal lobe. This likely represents focal hygroma or arachnoid cyst. Electronically Signed   By: San Morelle M.D.   On: 10/14/2021 19:04   CT ABDOMEN PELVIS W CONTRAST  Result Date: 10/14/2021 CLINICAL DATA:  Sepsis EXAM: CT ABDOMEN AND PELVIS WITH CONTRAST TECHNIQUE: Multidetector CT imaging of the abdomen and pelvis was performed using the standard protocol following bolus administration of intravenous contrast. RADIATION DOSE REDUCTION: This exam was performed according to the departmental dose-optimization program which includes automated exposure control, adjustment of the mA and/or kV according to patient size and/or use of iterative reconstruction technique. CONTRAST:  161m OMNIPAQUE IOHEXOL 300 MG/ML  SOLN COMPARISON:  10/01/2021 FINDINGS: Motion  degraded images. Lower chest: Mild patchy bilateral lower lobe opacities, improved from the recent prior, likely scarring/atelectasis. Hepatobiliary: Subcentimeter cyst in the left hepatic lobe (series 3/image 21), benign. No follow-up is recommended. Status post cholecystectomy. No intrahepatic or extrahepatic duct dilatation. Pancreas: Within normal limits. Spleen: Within normal limits. Adrenals/Urinary Tract: Adrenal glands are within normal limits. 3.2 cm hyperdense lesion along the posterior right upper kidney, measuring 45 HUs, indeterminate. However, this is stable multiple studies dating back to 2017, and therefore can be considered benign (Bosniak II). No follow-up is recommended. Additional subcentimeter simple cysts in the left lower kidney, benign (Bosniak I). No follow-up is recommended. No hydronephrosis. Bladder is mildly thick-walled although underdistended. Stomach/Bowel: Enteric tube terminates in the gastric antrum. No evidence of bowel obstruction. Appendix is not discretely visualized, reportedly surgically absent. Mild left colonic diverticulosis, without evidence of diverticulitis. Immediately beneath the surgical incision, there is irregularity of the mid transverse colon in the anterior abdomen with adjacent tiny foci of gas and possible extraluminal contrast (series 3/image 51), including a blind fistula extending from the loop towards the abdominal wall (sagittal image 78). This raises concern for injury/microperforation of this segment of bowel. No associated drainable fluid collection/abscess. No gross free air. Vascular/Lymphatic: No evidence of abdominal aortic aneurysm. Atherosclerotic calcifications of the abdominal aorta and branch vessels. Small upper abdominal and retroperitoneal lymph nodes which do not meet pathologic CT size criteria. Reproductive: Prostate is unremarkable. Other: Postsurgical changes along the midline anterior abdominal wall. No abdominopelvic ascites.  Musculoskeletal: Degenerative changes of the visualized thoracolumbar spine. IMPRESSION: Motion degraded images. Suspected injury/microperforation of the mid transverse colon in the anterior abdomen, immediately beneath the surgical incision. Associated tiny foci of gas and possible extraluminal contrast, as described above. No associated drainable collection/abscess. No gross free air. Surgical consultation is suggested. Additional stable ancillary findings as above. Electronically Signed   By: Julian Hy M.D.   On: 10/14/2021 17:28   DG Abd 1 View  Result Date: 10/16/2021 CLINICAL DATA:  NG tube placement. EXAM: ABDOMEN - 1 VIEW COMPARISON:  Abdominal radiographs 10/11/2021 FINDINGS: The previous enteric tube has been removed, and a new weighted enteric tube has been placed with tip in the right upper quadrant in the expected region of the gastric pylorus. No dilated loops of bowel are seen in the included portion of the abdomen to indicate obstruction. Right-sided abdominal surgical clips and skin staples are noted. Opacity in the left lung base is suggestive of atelectasis, with a small left pleural effusion also possible. IMPRESSION: Enteric tube terminating over the distal stomach. Electronically Signed   By: Logan Bores M.D.   On: 10/06/2021 11:10   DG Abd 1 View  Result Date: 10/11/2021 CLINICAL DATA:  Vomiting EXAM: ABDOMEN - 1 VIEW COMPARISON:  the previous day's study FINDINGS: Gastric tube remains in the stomach, which is partially distended by gas. Surgical clips near the GE junction, and in the right upper abdomen. Skin staples right lower quadrant. A few gas distended nondilated mid abdominal small bowel loops. The colon is nondilated. Regional bones unremarkable. IMPRESSION: Nonobstructive bowel gas pattern, with nasogastric tube to the stomach. Electronically Signed   By: Lucrezia Europe M.D.   On: 10/11/2021 10:47   DG Abd 1 View  Result Date: 10/10/2021 CLINICAL DATA:  Ileus EXAM:  ABDOMEN - 1 VIEW COMPARISON:  KUB 10/07/2021 FINDINGS: The enteric catheter tip and side hole are in the stomach. Mild gaseous distention of the bowel is similar to slightly improved. There  is no new or progressive bowel dilation. There is no definite free intraperitoneal air, within the confines of supine technique. There is no abnormal soft tissue calcification. There is no acute osseous abnormality. IMPRESSION: Similar to slightly improved gaseous distention of the bowel since 10/07/2021. No new or worsening bowel dilation. Electronically Signed   By: Valetta Mole M.D.   On: 10/10/2021 08:24   DG Chest Port 1 View  Result Date: 10/09/2021 CLINICAL DATA:  Hypoxia EXAM: PORTABLE CHEST 1 VIEW COMPARISON:  Yesterday FINDINGS: Endotracheal tube tip just below the clavicular heads. The enteric tube at least reaches the stomach. Right PICC with tip at the upper SVC. Stable heart size and mediastinal contours which are widened due to mediastinal fat by recent CT. Low volume chest with indistinct density at the bases where there was atelectasis on prior. No significant effusion. No visible pneumothorax. Artifact from EKG leads. IMPRESSION: 1. Stable hardware positioning. 2. Stable low lung volumes with atelectasis. Electronically Signed   By: Jorje Guild M.D.   On: 10/09/2021 04:45   DG Chest Port 1 View  Result Date: 10/08/2021 CLINICAL DATA:  Hypoxia. EXAM: PORTABLE CHEST 1 VIEW COMPARISON:  10/07/2021 FINDINGS: The endotracheal tube tip terminates above the carina. There is an enteric tube with tip below the level of the GE junction. Right arm PICC line tip is in the SVC. Stable cardiomediastinal contours. Lung volumes are low. No airspace opacities identified. Bandlike opacity within the perihilar right mid lung is favored to represent an area of subsegmental atelectasis. Unchanged from prior exam. IMPRESSION: 1. Low lung volumes with right midlung subsegmental atelectasis, unchanged. 2. Stable support  apparatus. Electronically Signed   By: Kerby Moors M.D.   On: 10/08/2021 07:07   DG Abd 2 Views  Result Date: 10/07/2021 CLINICAL DATA:  Acute respiratory failure.  SBO. EXAM: ABDOMEN - 2 VIEW COMPARISON:  10/04/2021 FINDINGS: Enteric tube tip and side port are below the level of the GE junction. Retained enteric contrast material noted within the gastric fundus. Surgical clips identified along the right hemiabdomen and right upper quadrant. Mild gaseous distension of the small bowel loops noted with stooling gas throughout the colon up to the rectum. The degree of small-bowel distention appears unchanged in the interval. IMPRESSION: 1. Unchanged gaseous distension of the small bowel loops compatible with postoperative ileus. Electronically Signed   By: Kerby Moors M.D.   On: 10/07/2021 08:31   DG Chest Port 1 View  Result Date: 10/07/2021 CLINICAL DATA:  Acute respiratory failure. EXAM: PORTABLE CHEST 1 VIEW COMPARISON:  10/03/2021 FINDINGS: ET tube tip is unchanged above the carina. There is a right arm PICC line with tip at the cavoatrial junction. Enteric tube tip is below the hemidiaphragms. Stable cardiomediastinal contours. Lung volumes are low. Mild atelectasis identified within the lung bases. No signs of pleural effusion or edema. No airspace opacities. IMPRESSION: 1. Stable support apparatus. 2. Low lung volumes with bibasilar atelectasis. Electronically Signed   By: Kerby Moors M.D.   On: 10/07/2021 08:28   DG Abd 1 View  Result Date: 10/04/2021 CLINICAL DATA:  Ileus. EXAM: ABDOMEN - 1 VIEW COMPARISON:  CT AP 10/05/2021. FINDINGS: There is an enteric tube with tip and side port well below the GE junction. Surgical clips noted at the level of the GE junction. Gaseous distension of the small bowel loops identified with stool and gas noted throughout the colon up to the rectum. Midline skin staples identified. IMPRESSION: Gaseous distension of the small bowel  loops compatible with  postoperative ileus. Electronically Signed   By: Kerby Moors M.D.   On: 10/04/2021 08:13   DG Chest Port 1 View  Result Date: 10/03/2021 CLINICAL DATA:  Endotracheal and enteric tube placement EXAM: PORTABLE CHEST 1 VIEW COMPARISON:  Chest radiograph 10/01/2021 FINDINGS: The endotracheal tube tip is approximately 3.6 cm from the carina. The enteric catheter tip is in the stomach. The cardiomediastinal silhouette is grossly stable allowing for rightward patient rotation. There is no new or worsening focal airspace disease. There is no pulmonary edema. There is no significant pleural effusion. There is no pneumothorax There is no acute osseous abnormality. IMPRESSION: 1. Endotracheal tube tip in the midthoracic trachea. The enteric catheter tip is in the stomach. 2. Stable aeration with no new or worsening focal airspace disease. Electronically Signed   By: Valetta Mole M.D.   On: 10/03/2021 10:46   [place] EKG SITE RITE  Result Date: 10/03/2021 If Site Rite image not attached, placement could not be confirmed due to current cardiac rhythm.  DG Abd 2 Views  Result Date: 10/03/2021 CLINICAL DATA:  Ileus. EXAM: ABDOMEN - 2 VIEW COMPARISON:  Abdominal radiographs 10/02/2021 FINDINGS: No significant free air noted on the upright view. Postsurgical changes in the abdomen. Gas-filled loops of bowel throughout the abdomen, predominantly small bowel. Bowel distension may have slightly progressed since 10/02/2021. Lung bases are clear. A small amount of contrast in the gastric fundus which is unchanged. IMPRESSION: Gas-filled loops of bowel throughout the abdomen and pelvis. Findings are suggestive for an ileus pattern. Bowel gas distension may have slightly progressed. Electronically Signed   By: Markus Daft M.D.   On: 10/03/2021 08:42   DG Abd 1 View  Result Date: 10/02/2021 CLINICAL DATA:  NG tube placement EXAM: ABDOMEN - 1 VIEW COMPARISON:  October 01, 2021, September 30, 2021 FINDINGS: NG tube remains in place,  with the tip and side port in the stomach. Small volume radiopaque material within the stomach, possibly enteric contrast. Overall unchanged dilated loops of small bowel, consistent with known ileus. Rectal bag in place. Staples projecting over the mid abdomen and surgical clips projecting over the right upper quadrant. No acute osseous abnormality. Visualized bibasilar lungs are clear. IMPRESSION: NG tube remains within the stomach, with unchanged dilated loops of small bowel consistent with known ileus. Electronically Signed   By: Beryle Flock M.D.   On: 10/02/2021 13:42   CT CHEST ABDOMEN PELVIS WO CONTRAST  Result Date: 10/01/2021 CLINICAL DATA:  71 year old male with history of pneumonia. EXAM: CT CHEST, ABDOMEN AND PELVIS WITHOUT CONTRAST TECHNIQUE: Multidetector CT imaging of the chest, abdomen and pelvis was performed following the standard protocol without IV contrast. RADIATION DOSE REDUCTION: This exam was performed according to the departmental dose-optimization program which includes automated exposure control, adjustment of the mA and/or kV according to patient size and/or use of iterative reconstruction technique. COMPARISON:  CT of the abdomen and pelvis 10/04/2021. Chest CTA 10/12/2017. FINDINGS: CT CHEST FINDINGS Cardiovascular: Heart size is normal. There is no significant pericardial fluid, thickening or pericardial calcification. There is aortic atherosclerosis, as well as atherosclerosis of the great vessels of the mediastinum and the coronary arteries, including calcified atherosclerotic plaque in the left main, left anterior descending, left circumflex and right coronary arteries. Mediastinum/Nodes: No pathologically enlarged mediastinal or hilar lymph nodes. Please note that accurate exclusion of hilar adenopathy is limited on noncontrast CT scans. Patient is intubated, with the tip of the endotracheal tube approximately 7.9 cm above  the carina. Nasogastric tube extends into the  stomach. Esophagus is otherwise unremarkable in appearance. No axillary lymphadenopathy. Lungs/Pleura: Dependent areas of subsegmental atelectasis or scarring are noted in the lower lobes of the lungs bilaterally. There may also be a small amount of airspace consolidation in the dependent portion of the right lower lobe. No pleural effusions. No suspicious appearing pulmonary nodules or masses are noted. Several calcified granulomas are noted in the lungs bilaterally. Musculoskeletal: There are no aggressive appearing lytic or blastic lesions noted in the visualized portions of the skeleton. Several old healed posterior right-sided rib fractures are incidentally noted. CT ABDOMEN PELVIS FINDINGS Hepatobiliary: Previously noted portal venous gas has resolved. No definite suspicious cystic or solid hepatic lesions are confidently identified on today's noncontrast CT examination. Liver has a slightly shrunken appearance and nodular contour, indicative of underlying cirrhosis. Status post cholecystectomy. Pancreas: No definite pancreatic mass or peripancreatic fluid collections or inflammatory changes are noted on today's noncontrast CT examination. Spleen: Trace amount of perisplenic fluid. Spleen is otherwise unremarkable in appearance. Adrenals/Urinary Tract: Exophytic intermediate attenuation (43 HU) 3 cm lesion extending from the upper pole of the right kidney is indeterminate. Left kidney and bilateral adrenal glands are otherwise normal in appearance. No hydroureteronephrosis. Urinary bladder is nearly completely decompressed around an indwelling Foley balloon catheter. Some contrast material is noted within the lumen of the urinary bladder, as is a small amount of gas non dependently which is presumably iatrogenic. Stomach/Bowel: The appearance of the stomach is normal. Several mildly dilated loops of small bowel are again noted, measuring up to 4.5 cm in diameter in the central abdomen. Multiple air-fluid  levels are noted in the small bowel. Oral contrast material is noted in the distal small bowel and colon. Rectal bag in place. A few scattered colonic diverticuli are noted without definite focal surrounding inflammatory changes to indicate an acute diverticulitis at this time. The appendix is not confidently identified and may be surgically absent. Regardless, there are no inflammatory changes noted adjacent to the cecum to suggest the presence of an acute appendicitis at this time. Vascular/Lymphatic: Atherosclerosis in the abdominal aorta and pelvic vasculature. No lymphadenopathy noted in the abdomen or pelvis. Reproductive: Prostate gland and seminal vesicles are unremarkable in appearance. Other: Small volume of ascites. Small volume of pneumoperitoneum, within normal limits given the recent open laparotomy. Musculoskeletal: New midline skin staples from recent laparotomy. There are no aggressive appearing lytic or blastic lesions noted in the visualized portions of the skeleton. IMPRESSION: 1. Resolution of previously noted pneumatosis and portal venous gas portal venous gas. There are some persistent areas of small-bowel dilatation, with multiple ill-defined loops of small bowel and multiple air-fluid levels. This may simply represent a mild postoperative ileus, although persistent partial small bowel obstruction is not entirely excluded. 2. Trace volume of ascites. 3. Dependent opacities in the lung bases bilaterally favored to predominantly reflect areas of subsegmental atelectasis and/or chronic scarring, although a small amount of airspace consolidation is not excluded in the posterior aspect of the right lower lobe. 4. Aortic atherosclerosis, in addition to left main and three-vessel coronary artery disease. Assessment for potential risk factor modification, dietary therapy or pharmacologic therapy may be warranted, if clinically indicated. 5. Additional incidental findings, as above. Electronically  Signed   By: Vinnie Langton M.D.   On: 10/01/2021 10:20   DG Chest Port 1 View  Result Date: 10/01/2021 CLINICAL DATA:  Respiratory failure with hypoxia. EXAM: PORTABLE CHEST 1 VIEW COMPARISON:  Portable chest  yesterday at 4:03 p.m. FINDINGS: 5:07 a.m. ETT tip is 6.1 cm from the carina. NGT is within the stomach with tip in the proximal fundal region. Cardiomegaly is stable as well as superior mediastinal widening, aortic atherosclerosis and tortuosity. No vascular congestion is seen. The lungs hypoexpanded but generally clear with limited view of the bases, right perihilar linear atelectasis. Numerous overlying monitor wires. IMPRESSION: Stable hypoinflated chest with no visible pneumonia. Limited view of the bases. Cardiomegaly and aortic atherosclerosis. Electronically Signed   By: Telford Nab M.D.   On: 10/01/2021 06:24   DG Abd Portable 1V  Result Date: 10/07/2021 CLINICAL DATA:  NG tube placement EXAM: PORTABLE ABDOMEN - 1 VIEW COMPARISON:  CT earlier today FINDINGS: The enteric tube tip and side port within the gastric fundus. Redemonstrated dilated loops small bowel as seen on CT earlier today. IMPRESSION: Enteric tube in good position. Electronically Signed   By: Placido Sou M.D.   On: 09/22/2021 18:57   DG Chest Port 1 View  Result Date: 10/18/2021 CLINICAL DATA:  Respiratory failure EXAM: PORTABLE CHEST 1 VIEW COMPARISON:  Previous studies including the examination done earlier today FINDINGS: Transverse diameter of heart is increased. There is poor inspiration. There are no signs of pulmonary edema or focal pulmonary consolidation. Left lateral CP angle is indistinct which may be due to chest wall attenuation or minimal effusion. There is no pneumothorax. Tip of endotracheal tube is 5.9 cm above the carina. Tip of enteric tube is noted at the gastroesophageal junction. Side port in the NG tube is noted in the course of lower thoracic esophagus. IMPRESSION: There are no signs of  pulmonary edema or focal pulmonary consolidation. Side-port in NG tube is seen in the lower thoracic esophagus. NG tube should be advanced 10 cm to place the tip and side port within the stomach. Electronically Signed   By: Elmer Picker M.D.   On: 10/12/2021 16:42   CT ABDOMEN PELVIS W CONTRAST  Result Date: 09/24/2021 CLINICAL DATA:  Abdominal pain, concern for obstruction EXAM: CT ABDOMEN AND PELVIS WITH CONTRAST TECHNIQUE: Multidetector CT imaging of the abdomen and pelvis was performed using the standard protocol following bolus administration of intravenous contrast. RADIATION DOSE REDUCTION: This exam was performed according to the departmental dose-optimization program which includes automated exposure control, adjustment of the mA and/or kV according to patient size and/or use of iterative reconstruction technique. CONTRAST:  29m OMNIPAQUE IOHEXOL 300 MG/ML  SOLN COMPARISON:  CT 06/05/2021 FINDINGS: Lower chest: No acute abnormality.  Coronary artery atherosclerosis. Hepatobiliary: Extensive portal venous gas throughout the liver. No focal liver mass is evident. Prior cholecystectomy. Pancreas: Unremarkable. No pancreatic ductal dilatation or surrounding inflammatory changes. Spleen: Normal in size without focal abnormality. Adrenals/Urinary Tract: Unremarkable adrenal glands. Stable hyperdense upper pole right renal cyst. Kidneys enhance symmetrically. No stone or hydronephrosis. Mild urinary bladder wall thickening which may be accentuated by under distension. Stomach/Bowel: Stomach is moderately dilated. There are multiple dilated loops of small bowel without a well-defined transition point. There is extensive pneumatosis within several loops of small bowel within the central abdomen (series 5, image 57) with mucosal hypoenhancement. There is some liquid stool within the right colon. Colon is otherwise relatively decompressed. Vascular/Lymphatic: Gas is present throughout the mesenteric  vessels. No obvious vascular occlusion on non angiographic images. Aortic atherosclerosis. No enlarged abdominal or pelvic lymph nodes. Reproductive: Prostate gland within normal limits. Other: Small volume pneumoperitoneum. No ascites. No organized abdominopelvic fluid collection. Musculoskeletal: Age indeterminate nondisplaced fracture of  the posterior left sixth rib. Osseous structures appear otherwise within normal limits. IMPRESSION: 1. Findings compatible with acute mesenteric ischemia. There is extensive pneumatosis within several loops of small bowel within the central abdomen with mucosal hypoenhancement. Extensive mesenteric and portal venous gas. Small volume pneumoperitoneum. Urgent surgical evaluation is recommended. 2. Multiple dilated loops of small bowel without abrupt transition point. It is uncertain if this is secondary to bowel ischemia or mechanical obstruction. 3. No obvious arterial occlusion or thrombus is evident on limited non-angiographic images. 4. Age indeterminate nondisplaced fracture of the posterior left sixth rib. 5. Mild urinary bladder wall thickening which may be accentuated by under distension. Correlate with urinalysis to exclude cystitis. 6. Aortic Atherosclerosis (ICD10-I70.0). Critical Value/emergent results were called by telephone at the time of interpretation on 09/26/2021 at 10:33 am to provider [facility] , who verbally acknowledged these results. Electronically Signed   By: Davina Poke D.O.   On: 10/17/2021 10:35   DG Chest Portable 1 View  Result Date: 09/29/2021 CLINICAL DATA:  Shortness of breath.  Head history of CHF. EXAM: PORTABLE CHEST 1 VIEW COMPARISON:  June 05, 2021 FINDINGS: The heart size and mediastinal contours are within normal limits. Both lungs are clear. The visualized skeletal structures are unremarkable. IMPRESSION: No active disease. Electronically Signed   By: Dorise Bullion III M.D.   On: 10/14/2021 09:25    Microbiology No results  found for this or any previous visit (from the past 240 hour(s)).  Lab Basic Metabolic Panel: Recent Labs  Lab 10/14/21 0422 10/15/21 0113 10/15/21 1600 10/16/21 0400 10/16/21 1455 10/17/21 0527 10/18/21 0456  NA 145 148*  --  151*  --  148* 142  K 3.8 3.4* 5.0 3.3* 3.4* 3.3* 3.7  CL 118* 121*  --  119*  --  121* 114*  CO2 20* 22  --  25  --  24 20*  GLUCOSE 347* 141*  --  175*  --  181* 164*  BUN 54* 45*  --  48*  --  43* 35*  CREATININE 1.38* 1.10  --  1.09  --  1.06 0.97  CALCIUM 9.3 9.2  --  9.0  --  9.2 9.3  MG 2.4 2.2  --  2.1  --  1.8 1.6*  PHOS 2.7 2.7  --  2.3* 2.8 3.1 3.5   Liver Function Tests: Recent Labs  Lab 10/16/21 0400  AST 68*  ALT 74*  ALKPHOS 54  BILITOT 0.7  PROT 6.5  ALBUMIN 2.1*   No results for input(s): "LIPASE", "AMYLASE" in the last 168 hours. No results for input(s): "AMMONIA" in the last 168 hours. CBC: Recent Labs  Lab 10/14/21 0422 10/14/21 2344 10/15/21 0113 10/15/21 0508 10/16/21 0400 10/16/21 1223 10/17/21 0016 10/17/21 0527 10/17/21 1300 10/17/21 1812 10/18/21 0029 10/18/21 0456  WBC 9.8  --  11.4*  --  8.9  --  9.1  --   --   --   --  11.1*  NEUTROABS 8.2*  --  9.4*  --  7.3  --  7.5  --   --   --   --  8.7*  HGB 10.6*   < > 9.9*   < > 8.9*   < > 8.7* 8.6* 8.8* 8.9* 8.7* 9.1*  HCT 34.3*   < > 31.2*   < > 27.7*   < > 28.7* 27.9* 28.5* 28.6* 27.7* 29.0*  MCV 94.0  --  91.5  --  91.7  --  94.7  --   --   --   --  94.2  PLT 216  --  224  --  203  --  211  --   --   --   --  204   < > = values in this interval not displayed.   Cardiac Enzymes: No results for input(s): "CKTOTAL", "CKMB", "CKMBINDEX", "TROPONINI" in the last 168 hours. Sepsis Labs: Recent Labs  Lab 10/15/21 0113 10/15/21 1753 10/16/21 0400 10/17/21 0016 10/18/21 0456  WBC 11.4*  --  8.9 9.1 11.1*  LATICACIDVEN  --  1.1  --   --   --      Filipe Greathouse 10-28-21, 7:16 AM

## 2021-11-19 DEATH — deceased
